# Patient Record
Sex: Male | Born: 1945
Health system: Southern US, Community
[De-identification: ages and names within clinical notes are randomized; demographics above are authoritative.]

## PROBLEM LIST (undated history)

## (undated) DIAGNOSIS — Z955 Presence of coronary angioplasty implant and graft: Secondary | ICD-10-CM

## (undated) DIAGNOSIS — Z8631 Personal history of diabetic foot ulcer: Secondary | ICD-10-CM

## (undated) DIAGNOSIS — G35 Multiple sclerosis: Secondary | ICD-10-CM

## (undated) DIAGNOSIS — I2510 Atherosclerotic heart disease of native coronary artery without angina pectoris: Secondary | ICD-10-CM

## (undated) DIAGNOSIS — R3911 Hesitancy of micturition: Secondary | ICD-10-CM

## (undated) DIAGNOSIS — R269 Unspecified abnormalities of gait and mobility: Secondary | ICD-10-CM

## (undated) DIAGNOSIS — E11610 Type 2 diabetes mellitus with diabetic neuropathic arthropathy: Secondary | ICD-10-CM

## (undated) DIAGNOSIS — G894 Chronic pain syndrome: Secondary | ICD-10-CM

## (undated) DIAGNOSIS — N401 Enlarged prostate with lower urinary tract symptoms: Secondary | ICD-10-CM

## (undated) DIAGNOSIS — E1140 Type 2 diabetes mellitus with diabetic neuropathy, unspecified: Secondary | ICD-10-CM

## (undated) DIAGNOSIS — E109 Type 1 diabetes mellitus without complications: Secondary | ICD-10-CM

## (undated) DIAGNOSIS — Z8673 Personal history of transient ischemic attack (TIA), and cerebral infarction without residual deficits: Secondary | ICD-10-CM

## (undated) DIAGNOSIS — F329 Major depressive disorder, single episode, unspecified: Secondary | ICD-10-CM

## (undated) DIAGNOSIS — I252 Old myocardial infarction: Secondary | ICD-10-CM

## (undated) DIAGNOSIS — F32A Depression, unspecified: Secondary | ICD-10-CM

## (undated) DIAGNOSIS — G250 Essential tremor: Secondary | ICD-10-CM

## (undated) DIAGNOSIS — N182 Chronic kidney disease, stage 2 (mild): Secondary | ICD-10-CM

## (undated) DIAGNOSIS — M1990 Unspecified osteoarthritis, unspecified site: Secondary | ICD-10-CM

## (undated) DIAGNOSIS — Z9889 Other specified postprocedural states: Secondary | ICD-10-CM

## (undated) DIAGNOSIS — I6930 Unspecified sequelae of cerebral infarction: Secondary | ICD-10-CM

## (undated) DIAGNOSIS — K056 Periodontal disease, unspecified: Secondary | ICD-10-CM

## (undated) DIAGNOSIS — I10 Essential (primary) hypertension: Secondary | ICD-10-CM

## (undated) DIAGNOSIS — R112 Nausea with vomiting, unspecified: Secondary | ICD-10-CM

## (undated) HISTORY — DX: Unspecified abnormalities of gait and mobility: R26.9

## (undated) HISTORY — PX: CORONARY ANGIOPLASTY WITH STENT PLACEMENT: SHX49

---

## 1968-10-08 HISTORY — PX: OTHER SURGICAL HISTORY: SHX169

## 2003-02-07 ENCOUNTER — Ambulatory Visit (HOSPITAL_COMMUNITY): Admission: RE | Admit: 2003-02-07 | Discharge: 2003-02-07 | Payer: Self-pay | Admitting: Family Medicine

## 2003-09-16 ENCOUNTER — Encounter: Admission: RE | Admit: 2003-09-16 | Discharge: 2003-09-16 | Payer: Self-pay | Admitting: Neurology

## 2003-10-18 ENCOUNTER — Inpatient Hospital Stay (HOSPITAL_COMMUNITY): Admission: EM | Admit: 2003-10-18 | Discharge: 2003-10-21 | Payer: Self-pay | Admitting: *Deleted

## 2003-12-16 ENCOUNTER — Ambulatory Visit: Payer: Self-pay | Admitting: Cardiology

## 2004-03-05 ENCOUNTER — Ambulatory Visit: Payer: Self-pay | Admitting: Cardiology

## 2004-03-16 ENCOUNTER — Ambulatory Visit: Payer: Self-pay | Admitting: Cardiology

## 2004-03-22 ENCOUNTER — Ambulatory Visit: Payer: Self-pay | Admitting: Cardiology

## 2004-05-07 ENCOUNTER — Ambulatory Visit: Payer: Self-pay | Admitting: Cardiology

## 2004-08-06 ENCOUNTER — Ambulatory Visit: Payer: Self-pay | Admitting: Cardiology

## 2004-08-16 ENCOUNTER — Ambulatory Visit: Payer: Self-pay

## 2005-03-30 ENCOUNTER — Ambulatory Visit: Payer: Self-pay | Admitting: Cardiology

## 2006-01-06 ENCOUNTER — Ambulatory Visit: Payer: Self-pay | Admitting: Cardiology

## 2007-01-08 ENCOUNTER — Ambulatory Visit: Payer: Self-pay | Admitting: Cardiology

## 2008-01-21 ENCOUNTER — Ambulatory Visit: Payer: Self-pay | Admitting: Cardiology

## 2008-09-06 ENCOUNTER — Emergency Department (HOSPITAL_BASED_OUTPATIENT_CLINIC_OR_DEPARTMENT_OTHER): Admission: EM | Admit: 2008-09-06 | Discharge: 2008-09-06 | Payer: Self-pay | Admitting: Emergency Medicine

## 2008-09-06 ENCOUNTER — Ambulatory Visit: Payer: Self-pay | Admitting: Diagnostic Radiology

## 2008-09-08 ENCOUNTER — Inpatient Hospital Stay (HOSPITAL_COMMUNITY): Admission: AD | Admit: 2008-09-08 | Discharge: 2008-09-15 | Payer: Self-pay | Admitting: Endocrinology

## 2008-09-09 ENCOUNTER — Ambulatory Visit: Payer: Self-pay | Admitting: Infectious Diseases

## 2008-09-10 ENCOUNTER — Encounter (INDEPENDENT_AMBULATORY_CARE_PROVIDER_SITE_OTHER): Payer: Self-pay | Admitting: Orthopedic Surgery

## 2008-09-10 ENCOUNTER — Ambulatory Visit: Payer: Self-pay | Admitting: Vascular Surgery

## 2008-09-12 ENCOUNTER — Ambulatory Visit: Payer: Self-pay | Admitting: Physical Medicine & Rehabilitation

## 2008-09-20 ENCOUNTER — Inpatient Hospital Stay (HOSPITAL_COMMUNITY): Admission: EM | Admit: 2008-09-20 | Discharge: 2008-09-25 | Payer: Self-pay | Admitting: Emergency Medicine

## 2008-10-02 ENCOUNTER — Encounter (HOSPITAL_BASED_OUTPATIENT_CLINIC_OR_DEPARTMENT_OTHER): Admission: RE | Admit: 2008-10-02 | Discharge: 2008-12-31 | Payer: Self-pay | Admitting: Internal Medicine

## 2008-10-14 ENCOUNTER — Encounter (INDEPENDENT_AMBULATORY_CARE_PROVIDER_SITE_OTHER): Payer: Self-pay | Admitting: *Deleted

## 2008-11-14 ENCOUNTER — Encounter (INDEPENDENT_AMBULATORY_CARE_PROVIDER_SITE_OTHER): Payer: Self-pay | Admitting: *Deleted

## 2009-01-06 DIAGNOSIS — I2510 Atherosclerotic heart disease of native coronary artery without angina pectoris: Secondary | ICD-10-CM | POA: Insufficient documentation

## 2009-01-06 DIAGNOSIS — E108 Type 1 diabetes mellitus with unspecified complications: Secondary | ICD-10-CM | POA: Insufficient documentation

## 2009-01-06 DIAGNOSIS — I10 Essential (primary) hypertension: Secondary | ICD-10-CM | POA: Insufficient documentation

## 2009-01-06 DIAGNOSIS — E538 Deficiency of other specified B group vitamins: Secondary | ICD-10-CM | POA: Insufficient documentation

## 2009-01-06 DIAGNOSIS — E119 Type 2 diabetes mellitus without complications: Secondary | ICD-10-CM | POA: Insufficient documentation

## 2009-01-07 ENCOUNTER — Ambulatory Visit: Payer: Self-pay | Admitting: Cardiology

## 2009-01-07 DIAGNOSIS — E7800 Pure hypercholesterolemia, unspecified: Secondary | ICD-10-CM | POA: Insufficient documentation

## 2009-06-11 ENCOUNTER — Encounter: Payer: Self-pay | Admitting: Cardiology

## 2010-01-22 ENCOUNTER — Ambulatory Visit: Payer: Self-pay | Admitting: Cardiology

## 2010-02-27 ENCOUNTER — Encounter: Payer: Self-pay | Admitting: Family Medicine

## 2010-03-09 NOTE — Letter (Signed)
Summary: [facility] Office Note  [facility] Office Note   Imported By: Roderic Ovens 07/29/2009 15:55:28  _____________________________________________________________________  External Attachment:    Type:   Image     Comment:   External Document

## 2010-05-15 LAB — PROTIME-INR: Prothrombin Time: 13.2 seconds (ref 11.6–15.2)

## 2010-05-15 LAB — TYPE AND SCREEN: Antibody Screen: NEGATIVE

## 2010-05-15 LAB — ABO/RH: ABO/RH(D): O POS

## 2010-05-15 LAB — ANAEROBIC CULTURE

## 2010-05-15 LAB — DIFFERENTIAL
Eosinophils Absolute: 0.1 10*3/uL (ref 0.0–0.7)
Eosinophils Relative: 2 % (ref 0–5)
Lymphs Abs: 1.6 10*3/uL (ref 0.7–4.0)
Monocytes Absolute: 0.5 10*3/uL (ref 0.1–1.0)

## 2010-05-15 LAB — BASIC METABOLIC PANEL
GFR calc Af Amer: >60 mL/min (ref >60)
GFR calc non Af Amer: 60 mL/min — ABNORMAL LOW (ref >60)
Potassium: 4 mEq/L (ref 3.5–5.1)
Sodium: 137 mEq/L (ref 135–145)

## 2010-05-15 LAB — GLUCOSE, CAPILLARY
Glucose-Capillary: 114 mg/dL — ABNORMAL HIGH (ref 70–99)
Glucose-Capillary: 130 mg/dL — ABNORMAL HIGH (ref 70–99)
Glucose-Capillary: 133 mg/dL — ABNORMAL HIGH (ref 70–99)
Glucose-Capillary: 155 mg/dL — ABNORMAL HIGH (ref 70–99)
Glucose-Capillary: 216 mg/dL — ABNORMAL HIGH (ref 70–99)
Glucose-Capillary: 229 mg/dL — ABNORMAL HIGH (ref 70–99)
Glucose-Capillary: 242 mg/dL — ABNORMAL HIGH (ref 70–99)
Glucose-Capillary: 270 mg/dL — ABNORMAL HIGH (ref 70–99)
Glucose-Capillary: 89 mg/dL (ref 70–99)
Glucose-Capillary: 119 mg/dL — ABNORMAL HIGH (ref 70–99)
Glucose-Capillary: 163 mg/dL — ABNORMAL HIGH (ref 70–99)
Glucose-Capillary: 213 mg/dL — ABNORMAL HIGH (ref 70–99)
Glucose-Capillary: 91 mg/dL (ref 70–99)

## 2010-05-15 LAB — POCT I-STAT GLUCOSE: Operator id: 117072

## 2010-05-15 LAB — VANCOMYCIN, TROUGH: Vancomycin Tr: 17 ug/mL (ref 10.0–20.0)

## 2010-05-15 LAB — CBC
HCT: 37.2 % — ABNORMAL LOW (ref 39.0–52.0)
Hemoglobin: 12.8 g/dL — ABNORMAL LOW (ref 13.0–17.0)
Platelets: 362 10*3/uL (ref 150–400)
WBC: 7 10*3/uL (ref 4.0–10.5)

## 2010-05-15 LAB — CULTURE, ROUTINE-ABSCESS

## 2010-05-16 LAB — BASIC METABOLIC PANEL
BUN: 7 mg/dL (ref 6–23)
BUN: 9 mg/dL (ref 6–23)
CO2: 29 mEq/L (ref 19–32)
CO2: 31 mEq/L (ref 19–32)
Chloride: 103 mEq/L (ref 96–112)
Chloride: 99 mEq/L (ref 96–112)
Chloride: 99 mEq/L (ref 96–112)
Creatinine, Ser: 0.9 mg/dL (ref 0.4–1.5)
Creatinine, Ser: 1.1 mg/dL (ref 0.4–1.5)
GFR calc Af Amer: >60 mL/min (ref >60)
GFR calc non Af Amer: >60 mL/min (ref >60)
Glucose, Bld: 199 mg/dL — ABNORMAL HIGH (ref 70–99)
Glucose, Bld: 249 mg/dL — ABNORMAL HIGH (ref 70–99)
Glucose, Bld: 75 mg/dL (ref 70–99)
Potassium: 3.7 mEq/L (ref 3.5–5.1)
Sodium: 136 mEq/L (ref 135–145)

## 2010-05-16 LAB — GLUCOSE, CAPILLARY
Glucose-Capillary: 104 mg/dL — ABNORMAL HIGH (ref 70–99)
Glucose-Capillary: 146 mg/dL — ABNORMAL HIGH (ref 70–99)
Glucose-Capillary: 149 mg/dL — ABNORMAL HIGH (ref 70–99)
Glucose-Capillary: 154 mg/dL — ABNORMAL HIGH (ref 70–99)
Glucose-Capillary: 165 mg/dL — ABNORMAL HIGH (ref 70–99)
Glucose-Capillary: 167 mg/dL — ABNORMAL HIGH (ref 70–99)
Glucose-Capillary: 169 mg/dL — ABNORMAL HIGH (ref 70–99)
Glucose-Capillary: 181 mg/dL — ABNORMAL HIGH (ref 70–99)
Glucose-Capillary: 182 mg/dL — ABNORMAL HIGH (ref 70–99)
Glucose-Capillary: 185 mg/dL — ABNORMAL HIGH (ref 70–99)
Glucose-Capillary: 190 mg/dL — ABNORMAL HIGH (ref 70–99)
Glucose-Capillary: 213 mg/dL — ABNORMAL HIGH (ref 70–99)
Glucose-Capillary: 220 mg/dL — ABNORMAL HIGH (ref 70–99)
Glucose-Capillary: 223 mg/dL — ABNORMAL HIGH (ref 70–99)
Glucose-Capillary: 244 mg/dL — ABNORMAL HIGH (ref 70–99)
Glucose-Capillary: 254 mg/dL — ABNORMAL HIGH (ref 70–99)
Glucose-Capillary: 270 mg/dL — ABNORMAL HIGH (ref 70–99)
Glucose-Capillary: 272 mg/dL — ABNORMAL HIGH (ref 70–99)
Glucose-Capillary: 39 mg/dL — CL (ref 70–99)
Glucose-Capillary: 71 mg/dL (ref 70–99)
Glucose-Capillary: 76 mg/dL (ref 70–99)
Glucose-Capillary: 82 mg/dL (ref 70–99)
Glucose-Capillary: 91 mg/dL (ref 70–99)

## 2010-05-16 LAB — CBC
Hemoglobin: 14.8 g/dL (ref 13.0–17.0)
MCHC: 34.4 g/dL (ref 30.0–36.0)
MCHC: 34.6 g/dL (ref 30.0–36.0)
MCHC: 35.3 g/dL (ref 30.0–36.0)
MCV: 97.8 fL (ref 78.0–100.0)
MCV: 97.9 fL (ref 78.0–100.0)
Platelets: 179 10*3/uL (ref 150–400)
Platelets: 186 10*3/uL (ref 150–400)
RBC: 4.3 MIL/uL (ref 4.22–5.81)
RDW: 12.1 % (ref 11.5–15.5)
RDW: 12.6 % (ref 11.5–15.5)
RDW: 12.7 % (ref 11.5–15.5)
WBC: 10.3 10*3/uL (ref 4.0–10.5)

## 2010-05-16 LAB — DIFFERENTIAL
Basophils Absolute: 0.1 10*3/uL (ref 0.0–0.1)
Basophils Relative: 1 % (ref 0–1)
Eosinophils Absolute: 0 10*3/uL (ref 0.0–0.7)
Monocytes Absolute: 1 10*3/uL (ref 0.1–1.0)
Monocytes Relative: 12 % (ref 3–12)
Neutrophils Relative %: 74 % (ref 43–77)

## 2010-05-16 LAB — CULTURE, BLOOD (ROUTINE X 2)
Culture: NO GROWTH
Culture: NO GROWTH
Culture: NO GROWTH

## 2010-05-16 LAB — C-REACTIVE PROTEIN: CRP: 12.7 mg/dL — ABNORMAL HIGH (ref <0.6)

## 2010-05-16 LAB — COMPREHENSIVE METABOLIC PANEL
ALT: 42 U/L (ref 0–53)
Albumin: 2.8 g/dL — ABNORMAL LOW (ref 3.5–5.2)
Alkaline Phosphatase: 231 U/L — ABNORMAL HIGH (ref 39–117)
Calcium: 8.7 mg/dL (ref 8.4–10.5)
Potassium: 3.5 mEq/L (ref 3.5–5.1)
Sodium: 138 mEq/L (ref 135–145)
Total Protein: 6.6 g/dL (ref 6.0–8.3)

## 2010-05-16 LAB — ANAEROBIC CULTURE: Gram Stain: NONE SEEN

## 2010-05-16 LAB — TISSUE CULTURE

## 2010-05-16 LAB — SEDIMENTATION RATE: Sed Rate: 50 mm/hr — ABNORMAL HIGH (ref 0–16)

## 2010-05-16 LAB — CREATININE, SERUM: GFR calc non Af Amer: >60 mL/min (ref >60)

## 2010-06-22 NOTE — Consult Note (Signed)
[name], [name]                 ACCOUNT NO.:  [id number]   MEDICAL RECORD NO.:  [id number]          PATIENT TYPE:  INP   LOCATION:  1430                         FACILITY:  [facility]   PHYSICIAN:  Alvy Beal, MD    DATE OF BIRTH:  22-Sep-1945   DATE OF CONSULTATION:  09/08/2008  DATE OF DISCHARGE:                                 CONSULTATION   CONSULTATION REQUESTED BY:  Tera Mater. Evlyn Kanner, M.D.   REASON FOR CONSULTATION:  Left medial aspect of the foot.   The patient had suffered a foot fracture in the past.  He has a known  history of peripheral neuropathy which delayed treatment.  He ultimately  had it treated but he said he has always had a significant callus on the  medial side of that left foot.  Over the last week or so he was having  fevers and intermittent chills and ultimately went to the outpatient  emergency room in [facility].  There he had it debrided.  This occurred  on Saturday and approximately 2 days later he was having increasing  fevers, chills and so he presented to his primary care physician, Dr.  Evlyn Kanner.  Dr. Evlyn Kanner was worried about the patient becoming septic and  having worsening infection.  The patient also indicated that the overall  appearance was worse since the debridement 2 days earlier.  As a result  he was admitted to the hospital and I was consulted for definitive  management for evaluation and further possible surgical intervention.   Please refer to Dr. Rosanna Randy and P for specifics on the past medical,  surgical, family history, social history, medications and allergies.  It  should be noted the patient does have a history of diabetes and  peripheral neuropathy.   CLINICAL EXAM:  The patient was evaluated with my partner Dr. Lestine Box.  There is a significant erythema on the medial aspect of the foot with an  obvious bony prominence and a significant ulceration.  The patient has  pitting edema up to about the level of the ankle.  There is also  drainage expressible with deep palpation.  The patient has palpable but  very diminished pulses in the dorsalis pedis and posterior tibialis  regions.  Capillary refill cannot be determined because of nail bed  changes.   The patient has no knee pain.  No midcalf or proximal calf tenderness.  His compartments are soft and nontender.  There are no other obvious  skin abrasions, lacerations or ulcerations noted.  The patient is alert  and oriented x3.  He has no shortness of breath or chest pain.  His  abdomen is soft and nontender.   X-rays are now in the process of being done.  At this point in time I am  concerned about the patient's fevers and chills and progressive  deterioration of the ulceration since Saturday.  Both Dr. Lestine Box and I  feel as though it is very reasonable to take him to the operating room  for an incision and drainage.   Postoperatively I would put him into  a bulky dry dressing and splint.  On postoperative day #2 I would recommend having the splint removed for  wound care evaluation.   I think he would also benefit from a PICC line for prolonged IV  antibiotic treatment.  Given his questionable vascular status, I also  think it is reasonable to obtain an ABI after the splint is removed.  This was discussed with the patient and his wife.  I did discuss  surgery.  All appropriate risks, benefits and alternatives were  discussed and consent was obtained.      Alvy Beal, MD  Electronically Signed     DDB/MEDQ  D:  09/08/2008  T:  09/08/2008  Job:  [id number]   cc:   Tera Mater. Evlyn Kanner, M.D.  Fax: [phone]

## 2010-06-22 NOTE — Assessment & Plan Note (Signed)
[facility]                            CARDIOLOGY OFFICE NOTE   NAME:[name], [name] VALLADOLID                        MRN:          [id number]  DATE:01/21/2008                            DOB:          20-Nov-1945    [name] is in for a followup visit.  In general, he has been relatively  stable.  He has not been having any ongoing chest pain.  He has however  been under a fair amount of stress.  His mother has progressive dementia  and his elderly father is providing her care.  He says he has not been  smoking at all.  He sees himself as being somewhat emotionally unstable,  but he said he has been working with Dr. Jacky Kindle on this.  His blood  pressures at home have been relatively high, and not particularly under  great control.   Medications include amlodipine 5 mg daily, oxycodone 4 times daily,  Plavix 75 mg daily, Avodart 0.5 mg at bedtime, trazodone 150 mg at  bedtime, aspirin 81 mg daily, insulin, vitamins, Lipitor 10 mg at  bedtime, and metoprolol 25 mg p.o. t.i.d.   On physical exam, blood pressure is 162/80, pulse is 63.  The lung  fields are clear and the cardiac rhythm is regular.  There is not a  definite murmur noted.   Electrocardiogram demonstrates normal sinus rhythm within normal limits.   IMPRESSION:  1. Coronary artery disease with prior percutaneous stenting of the      right coronary artery and diabetic blood changes with previously a      drug-eluting stent.  2. Hypercholesterolemia on lipid-lowering therapy.  3. Hypertension.   RECOMMENDATIONS:  1. Continue follow up with Dr. Jacky Kindle.  2. We will increase his amlodipine to 10 mg daily and I have warned      him that he may experience some lower extremity swelling.  3. He is to call in with his blood pressures to let us know where he      stand within a few weeks.  He can check these at home this time.     Arturo Morton. Riley Kill, MD, [facility]  Electronically Signed    TDS/MedQ  DD:  01/21/2008  DT: 01/22/2008  Job #: [id number]

## 2010-06-22 NOTE — Op Note (Signed)
[name], [name]                 ACCOUNT NO.:  [id number]   MEDICAL RECORD NO.:  [id number]          PATIENT TYPE:  INP   LOCATION:  2550                         FACILITY:  [facility]   PHYSICIAN:  Almedia Balls. Ranell Patrick, M.D. DATE OF BIRTH:  06/11/1945   DATE OF PROCEDURE:  09/21/2008  DATE OF DISCHARGE:                               OPERATIVE REPORT   PREOPERATIVE DIAGNOSIS:  Left foot infection.   POSTOPERATIVE DIAGNOSIS:  Left foot infection.   PROCEDURE PERFORMED:  Left foot incision and drainage.   ATTENDING SURGEON:  Almedia Balls. Ranell Patrick, MD   ASSISTANTS:  None.   ANESTHESIA:  General anesthesia was used.   ESTIMATED BLOOD LOSS:  Minimal.   FLUID REPLACEMENT:  500 mL crystalloid.   Aerobic and anaerobic cultures were sent.   TOURNIQUET TIME:  17 minutes at 275 mmHg.   INSTRUMENT COUNTS:  Correct.   COMPLICATIONS:  None.   PERIOPERATIVE ANTIBIOTICS:  Vancomycin was given.   INDICATIONS:  The patient is a 65 year old male with worsening left foot  redness and drainage from a medial wound near his midfoot.  The patient  has had prior surgery dealing with a ulcer to the medial side of his  foot.  The patient is a diabetic.  The patient was seen by home health  today and the nurse called Orthopedics concerned about increasing  drainage and erythema around the medial incision and wound.  The patient  presents now with refractory foot swelling and pain, concern over deep  abscess on the plantar aspect of the foot with desquamation around the  plantar aspect of the foot.  Discussed with the patient and his wife  treatment options.  It is my concern that the patient had a very small  opening and that there might be a much larger abscess present as  purulence could be expressed from the plantar aspect of the foot.  Given  risk for increasing infection, family and the patient elected to proceed  with surgical management with I and D.  Informed consent obtained.   DESCRIPTION OF  PROCEDURE:  After an adequate level of anesthesia  achieved, the patient positioned supine on the operating table.  A calf  tourniquet was placed.  The left foot and leg were prepped and draped in  usual manner.  After elevation of limb for 3 minutes, we elevated the  tourniquet to 275 mmHg.  A longitudinal medial incision was created  lifting out the prior ulcer and opening at the junction between the  midfoot and forefoot.  The superficial layers of skin that was peeling  off was removed in the surrounding area.  All nonviable tissue was  removed from the medial aspect of the base of the great toe.  The wound  seemed to track plantar.  Blunt dissection was done under the metatarsal  into the deep plantar space.  The purulence that was encountered there  was basically cloudy-bloody fluid.  We did go ahead and cultured that  with aerobic, anaerobic cultures and Gram stain.  We then pulse  irrigated with 3 liters of irrigation the  joint capsule, the MTP joint  was intact as were the plantar tendons.  We thoroughly pulse irrigated,  everything looked to be very viable and vascular at that point.  There  was no more remaining infection.  We went ahead and placed a moistened  normal saline gauze into that area to control dead space and then dry  dressing over that.  The patient tolerated surgery well.      Almedia Balls. Ranell Patrick, M.D.  Electronically Signed     SRN/MEDQ  D:  09/21/2008  T:  09/21/2008  Job:  [id number]

## 2010-06-22 NOTE — Assessment & Plan Note (Signed)
[name]                            CARDIOLOGY OFFICE NOTE   NAME:[name], [name] REICHARDT                        MRN:          [id number]  DATE:01/08/2007                            DOB:          [birth year]    The patient is in for a follow-up visit.  He really is doing quite well.  He says he is not smoking.  He smokes an occasional cigar, but otherwise  is without.  He has a fair amount of pain related to his neuropathy, but  otherwise has done reasonably well.  He denies any cardiac pain at the  present time.  The patient underwent stenting of the distal right  coronary artery, and has longstanding diabetes.   MEDICATIONS:  1. Amlodipine 5 mg daily.  2. Oxycodone q.i.d.  3. Plavix 75 mg daily.  4. Avodart 0.5 mg q.h.s.  5. Trazodone 150 mg q.h.s.  6. Aspirin 81 mg daily.  7. Insulin daily.  8. Lipitor 10 mg q.h.s.  9. Metoprolol 25 mg p.o. b.i.d.   PHYSICAL EXAMINATION:  VITAL SIGNS:  Initially the blood pressure is  184/70 and when rechecked by me it was 145/70, pulse 72.  LUNGS:  Fields are clear.  HEART:  Rhythm is regular.  No murmur is appreciated.  There is no  obvious carotid bruit.   The electrocardiogram demonstrates normal sinus rhythm within normal  limits.   IMPRESSION:  1. Insulin-dependent diabetes mellitus.  2. Coronary artery disease, status post percutaneous stenting of the      distal right coronary artery using a drug eluting stent.  3. Multiple diffuse luminal irregularities.  4. Prior history of smoking.   PLAN:  Continue current medial regimen.  Continue follow-up with [name] [name], M.D.  Focus on diabetic and lipid control.  Return to clinic in  one year.     [name] [name]. [name] Kill, MD, [facility]  Electronically Signed    TDS/MedQ  DD: 01/08/2007  DT: 01/09/2007  Job #: [id number]

## 2010-06-22 NOTE — Op Note (Signed)
NAMEKYDAN, SHANHOLTZER                 ACCOUNT NO.:  [id number]   MEDICAL RECORD NO.:  [id number]          PATIENT TYPE:  INP   LOCATION:  1430                         FACILITY:  [facility]   PHYSICIAN:  Alvy Beal, MD    DATE OF BIRTH:  Jul 27, 1945   DATE OF PROCEDURE:  09/08/2008  DATE OF DISCHARGE:                               OPERATIVE REPORT   PREOPERATIVE DIAGNOSIS:  Left diabetic foot ulcer with sepsis.   POSTOPERATIVE DIAGNOSIS:  Left diabetic foot ulcer with sepsis.   OPERATIVE PROCEDURE:  I and D.   INTRAOPERATIVE FINDINGS:  Significant purulent material with wide  excision of infected material and application of splint.   HISTORY:  This is a very pleasant, 65 year old gentleman, who has had a  1-week history of fevers and increasing left foot swelling, erythema,  and drainage, seen in the ER at an outside institution Saturday, had a  local wound debridement and got significantly worse, presented to his  primary care physician, and directly admitted for further care.   After discussing treatment options with the patient, we elected to take  him to the operating room for a formal I and D of the wound.   All appropriate risks, benefits, and alternatives were discussed,  consent was obtained.   OPERATIVE NOTE:  The patient was brought to the operating room, placed  supine on the operating table.  After successful induction of general  anesthesia and endotracheal intubation, the left lower extremity was  prepped and draped in a standard fashion.  We did perform an appropriate  timeout, confirmed procedure, patient, and extremity.  At this point in  time, I made a central incision through the necrotic infected area and  noted there to be a significant pus abscess pocket.  Because of this and  the necrotic-looking nature of the tissue, I elected to do a wide  excision.  I excised all the necrotic tissue until I had about a 4 x 4.5-  cm size debrided area (about the same size  as a half dollar).  At this  point, I could not express any more purulent material and I had healthy  bleeding tissue.  I then irrigated the wound copiously with 9 L of  pulsatile irrigation.  I then put 3 stitches to help to gently bring the  skin edges closer to one another to facilitate later wound closure.  I  then placed a bulky dry dressing and a splint.  The patient was  extubated and transferred to the PACU without incident.  At the end of  case, all needle and sponge counts were correct.  The patient tolerated  the procedure well.      Alvy Beal, MD  Electronically Signed     DDB/MEDQ  D:  09/08/2008  T:  09/09/2008  Job:  [id number]

## 2010-06-22 NOTE — Discharge Summary (Signed)
[name], [name] NO.:  [id number]   MEDICAL RECORD NO.:  [id number]          PATIENT TYPE:  INP   LOCATION:  1430                         [facility]ACILITY:  [facility]   PHYSICIAN:  Geoffry Paradise, M.D.  DATE OF BIRTH:  [birth year]   DATE OF ADMISSION:  09/08/2008  DATE OF DISCHARGE:  09/15/2008                               DISCHARGE SUMMARY   DISCHARGE DIAGNOSES:  1. Left diabetic foot ulcer with soft tissue infection, status post      incision and drainage by Dr. Shon Baton of orthopedic surgery.  2. Diabetes mellitus type 1.  3. Diabetic neuropathy.  4. Diabetic nephropathy.  5. Hyperlipidemia.  6. Essential hypertension.  7. Atherosclerotic coronary artery disease, status post myocardial      infarction and stent.  8. Multiple sclerosis.  9. 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   HISTORY OF PRESENT ILLNESS:  [name] is a very pleasant 65 year old  gentleman who is currently disabled with Charcot feet and diabetic  neuropathy, multiple sclerosis, diabetes mellitus type 1, who presents  at this time with a persistent and progressive left foot infection.  He  has already been seen in the [facility] emergency room evidently several  days prior and underwent a minor I and D to this left foot.  Subsequent  to this, he has had progressive fever, chills pain and drainage  involving the left foot, resulting in re-presentation with obvious  diabetic foot infection worrisome for osteomyelitis and needing I and D.  He was admitted for IV antibiotics and surgical consultation.  For  details, see the dictated summary by my partner, Dr. Evlyn Kanner.   DATA:  Blood culture x2 negative.  Wound culture intraoperative  negative.  Chemistry; sodium 136, potassium 3.7, chloride 99, pO2 of 29,  glucose 199, BUN 0, creatinine 1.12, calcium 7.8.   HOSPITAL COURSE:  The patient was admitted and treated with his usual  home medications and  sliding scale insulin.  He was placed on a  combination of IV Zosyn and IV vancomycin.  Infectious disease and Dr.  Shon Baton of [facility] orth[facility]ics were consulted as well.  He underwent  incision and drainage of this left foot infection by Dr. Shon Baton on  September 08, 2008, with good success.  Subsequent cultures were negative  and ultimately wound continued to heal.  It was clean and dry with no  further purulence.  He did continue on a combination of vancomycin and  Zosyn with the recommendation by infectious disease to convert the Zosyn  to Avelox and continue the vancomycin at the time being.  The actual  wound per recommendations of wound care and orthopedic surgery, included  Aquagel into the left foot wound using this daily, cover with Kerlix and  Ace wrap, moisten with normal saline to remove previous day dressing.  Dr. Shon Baton did see him on the day of discharge and feels as though the  wound is nice and clean and dry with no concerns.  He is currently in a  Cam walker, nonweightbearing.  He  has been offered both skilled nursing  and inpatient rehab, but has declined both, insisting he will manage at  home.  I have cautioned him as to the risks and concerns with regards to  the continued need for IV antibiotics, timeliness and accuracy of his  dressing changes and the need for nonweightbearing, and he is fully  competent and understanding of all of the above.  He claims that he, his  wife and home health will be able to manage this.  From a vascular  standpoint, he did have arterial Dopplers done.  ABIs greater than 1  bilaterally with no concern for arterial insufficiency.  As such, he is  discharged in much improved and stable condition for further as an  outpatient.  He is resuming all of his home medications, including all  the insulin dosing.  The only addition is Avelox 400 mg daily  for two additional weeks and continuing vancomycin 1 gram daily could be  further dosed and  addressed by home health/advanced home care via his  right upper extremity PICC line.  He will be following up with Dr.  Shon Baton on September 22, 2008, and Dr. Jacky Kindle in approximately 2 weeks as  well.           ______________________________  Geoffry Paradise, M.D.     RA/MEDQ  D:  09/15/2008  T:  09/15/2008  Job:  [id number]   cc:   Alvy Beal, MD  Fax: [phone]

## 2010-06-22 NOTE — Assessment & Plan Note (Signed)
[facility]   NAME:  [name], [name] NO.:  [id number]   MEDICAL RECORD NO.:  [id number]      DATE OF BIRTH:  [birth year]   PHYSICIAN:  Jonelle Sports. Sevier, M.D.  VISIT DATE:  10/08/2008                                   OFFICE VISIT   HISTORY:  This 65 year old white male with longstanding type 1 diabetes  was seen here a week ago following extremely extended course of in-  hospital treatment for deeply infected diabetic left foot.  This  occurred in association with Charcot deformity in association with which  he had developed calluses, which became secondarily infected, and this  rapidly became deeply infected.  His only culture showed some  microaerophilic strep, but during the hospital after he had deep  surgical drainage, the Infectious Disease people recommended the use of  Avelox and vancomycin, which he has been receiving intravenously.   When he was seen a week ago by Dr. Leanord Hawking for his initial appointment  here, he was concerned that this might not prove adequate in that  something with some better potential anaerobic coverage might need to be  added.   Fortunately, the patient's wife who is his caregiver at home reports  that his feet have continued to improve with less swelling, less  erythema, and improvement wounds themselves since that time.   The wounds themselves incidentally were treated with silver alginate,  Kerlix, and Ace wrap.   On examination today, his blood pressure is 181/73, pulse 62,  respirations 18, temperature 98.5.  His more distal wound on the plantar  medial aspect of that foot measures 1.5 x 1.0 x 0.1 cm and have some  undermining at one of its margins.   The more proximal wound measures 2.0 x 0.8 x 0.2 cm and has a reasonably  granular base.  There is some erythema surrounding these wounds, but  there is no warmth and I suspect this is really the redness of the  chronic hyperemia associated with the  healing process.  There is no  drainage from either wound.  No significant swelling of the foot or  distal lower extremity.   IMPRESSION:  Satisfactory progress, healing deep foot infection, left.   DISPOSITION:  The wounds are sharply debrided selectively so with  removal of some skin from the wound margins and also slough from the  wound bases.  The more distal wound bleeds vigorously, but this is  quickly controlled with simple pressure.   The patient will be treated with reapplication of silver alginate, the  Kerlix, and Ace wrap; and his wife will continue to change the dressing  replacing it with the same after cleansing the wound on a daily basis.  The Vaseline is to be used on the periwound area for its protection.  He  is to continue his IV vancomycin and Avelox at present.   Followup visit here will be in 2 weeks.           ______________________________  Jonelle Sports Cheryll Cockayne, M.D.     RES/MEDQ  D:  10/08/2008  T:  10/09/2008  Job:  [id number]

## 2010-06-22 NOTE — Discharge Summary (Signed)
NAMESAMARION, EHLE NO.:  [id number]   MEDICAL RECORD NO.:  [id number]          PATIENT TYPE:  INP   LOCATION:  1430                         [facility]   PHYSICIAN:  Geoffry Paradise, M.D.  DATE OF BIRTH:  23-Aug-1945   DATE OF ADMISSION:  09/08/2008  DATE OF DISCHARGE:  09/15/2008                               DISCHARGE SUMMARY   DIAGNOSES AT THE TIME OF DISCHARGE:  1. Diabetic foot infection left foot with soft tissue infection and      abscess status post incision and drainage by orthopedic surgery.  2. Diabetes mellitus.   Dictation ended at this point.           ______________________________  Geoffry Paradise, M.D.     RA/MEDQ  D:  09/15/2008  T:  09/15/2008  Job:  [id number]

## 2010-06-22 NOTE — H&P (Signed)
[name], INFINGER NO.:  [id number]   MEDICAL RECORD NO.:  [id number]          PATIENT TYPE:  EMS   LOCATION:  ED                            FACILITY:  [facility]   PHYSICIAN:  Tera Mater. [place], M.D. 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  He  had a painful red foot for about a day, and had an I and D of this done.  He had IV antibiotics and Augmentin prescribed.  Since being home, he  has had more redness, pain, chills, and sweats with clinical toxicity,  little p.o. intake, and blood sugar as high as 245.  Today, he has felt  even weaker and was seen in the office this afternoon, and is now being  directly admitted.  He has basically had nothing to eat today.  He notes  some mild headaches.  He has had no cardiac complaints.  He has had no  breathing trouble.  His foot does hurt and it is usually  nephropathically numb.   PAST MEDICAL HISTORY:  Includes type 1 diabetes dating back to age 8.  He has always been on insulin.  He has had nephropathy with proteinuria.  He has neuropathy and he has had the Charcot change in 2006.  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  He has had B12 deficiency in  the past, hypertension, coronary artery disease with an acute inferior  MI and a stent in September 2005, ejection fraction 60%.  He has had BPH  and abnormal PSA in 2006.  He has had multiple sclerosis diagnosed at  clinic around 1985 and an MRI in the early 1990s was normal.  He has had  submandibular gland removed and bilateral carpal tunnel repair.   SOCIAL HISTORY:  He is married with a son.  His son is going to Social worker  school.  He is a nonsmoker.   CURRENT MEDICATIONS:  1. NPH 43 in the morning 17 in the evening.  2. Trazodone 150 daily.  3. Reglan.  4. Metoprolol.  5. Plavix.  6. Aspirin.  7. Lipitor.  8. Oxycodone ER 80 scheduled 4 times a day.  9. Oxycodone 10/325 p.r.n.  10.Avodart 0.5.  11.Lipitor 10.  12.Norvasc 10.   ALLERGIES:  He is allergic to nothing.   EXAM:  VITAL SIGNS:  Here in the office, blood pressure is 168/76, pulse  92, respirations 15, temperature 98.7.  CONSTITUTIONAL:  He is a mildly toxic-appearing white male sitting up in  no distress.  HEENT:  Sclerae anicteric.  Face is symmetrical.  Mucous membranes are  moist.  NECK:  Supple.  Well-healed scar.  No bruits are heard.  LUNGS:  Clear without wheeze, rales, or rhonchi.  HEART:  Regular and slightly tachycardic with a systolic murmur 1/6.  ABDOMEN:  Soft, nontender.  No hepatosplenomegaly.  EXTREMITIES:  Reveal pulses to be relatively good.  The left mid foot is  quite red with a large draining ulcer in the mid foot with granulation  tissue, ascending cellulitis, and some edema.  NEUROLOGIC:  The patient is awake and alert, speech is clear.  He is in  a wheelchair.  He is not walking at present.  Speech is intact.  No  resting tremors present.   SUMMARY:  We have a 65 year old white male with 40 years of type 1  diabetes and Charcot foot presenting with a diabetic foot infection.  His scenario is worrisome for ascending infection with clinical toxicity  and perhaps early sepsis.  He needs I and D, debridement, IV  antibiotics, and fluids, and he is going to be seeing ortho this  evening.  He is going to be getting IV Zosyn with a stat dose.  He is  going to have labs checked with blood cultures.  His chronic pain  medication will be continued.  His coronary artery disease is stable on  Plavix and aspirin and will be continued.  His hypertension will be  followed.  It should come down a bit.  His hyperlipidemia medication  will be continued.   He is not treated specifically for his multiple  sclerosis.  All of this was discussed with the patient and his son, and  his wife, and I talked with Dr. Venita Lick at [facility],  who will be seeing him.           ______________________________  Tera Mater. Evlyn Kanner, M.D.     SAS/MEDQ  D:  09/08/2008  T:  09/08/2008  Job:  [id number]

## 2010-06-22 NOTE — Discharge Summary (Signed)
[name], [name] NO.:  [id number]   MEDICAL RECORD NO.:  [id number]          PATIENT TYPE:  INP   LOCATION:                               FACILITY:  [facility]   PHYSICIAN:  [name], M.D.     DATE OF BIRTH:  1945-03-31   DATE OF ADMISSION:  09/20/2008  DATE OF DISCHARGE:                               DISCHARGE SUMMARY   ADMITTING DIAGNOSES:  1. Left foot ulcer x1 month.  2. Diabetes mellitus, type 1.  3. Coronary artery disease.  4. Multiple sclerosis.  5. Chronic pain.  6. Diabetic neuropathy.  7. Microcytic anemia.  8. History of B12 deficiency.   DISCHARGE DIAGNOSES:  1. Status post incision and drainage, left foot ulcer.  2. Microcytic anemia.  3. History of B12 deficiency.  4. Charcot arthropathy secondary to diabetic foot neuropathy.  5. Left foot infection, polymicrobic.  6. Type 1 diabetes mellitus.  7. Coronary artery disease.  8. Hypertension.  9. Dyslipidemia.  10.Multiple sclerosis.   HISTORY:  [name]  is a 65 year old diabetic with Charcot arthropathy,  chronic left medial foot callus.  He notes that this callus broke up  approximately 1 month ago.  Towards the end of July, his foot became  progressively swollen and painful.  He was seen in [facility] ED on September 06, 2008, started on Augmentin.  It became worse as of September 08, 2008,  and was admitted for I and D of soft tissue abscess.  Assessed Gram-  stain result.  Group positive cocci in pairs and gram-positive culture  grew microaerobic streptococci.  The patient was initially treated with  vancomycin and Zosyn.  At his discharge, he was discharged on Avelox and  vancomycin.  His condition worsened, he was readmitted on September 20, 2008, for second I and D of the foot.  Pus was found in the deep plantar  portion of his foot.  The September 21, 2008, operative specimen did not  show any organisms on the standard culture most likely due to recent  antibiotics.  Since then  polymicrobic, mixed anaerobic infection most  likely grew out.  The patient was placed on IV vancomycin and changed to  p.o. Avelox and p.o. Avelox was changed to Invanz and it was recommended  by ID that he be on vancomycin and Invanz for additional 4-6 weeks.   SURGICAL PROCEDURES:  The patient was taken to the operating room on  September 21, 2008, by Dr. Malon [name].  The patient was placed under  general anesthesia, and then a left foot incision and drainage was  performed.  Blood loss minimal.  Complications none.  The patient  returned to recovery in good stable condition.   HOSPITAL COURSE:  Postop day #1, the patient resting comfortable,  afebrile.  Vital signs stable.  Cultures negative.   Postop day #2, the patient without complaints, doing well, tolerating  diet well.  Afebrile, vital signs stable, wounds benign.  No drainage,  no erythema, no color.  Wound care consult was made.   Postop day #3, the  patient awake, alert.  Wound care seeing the patient  daily and doing whirlpool.  Tissue cultures showed microaerophilic  streptococci, otherwise, cultures negative to date.  ID consult was made  for antibiotic selection.  The patient was placed in Bledsoe full  contact boot, nonweightbearing on the left leg.   ID saw the patient and changed antibiotics to Invanz and vancomycin,  both IV for 4-6 weeks.   Postop day #4, the patient is doing well.  No complaints.  Pain under  control.  Some confusion about his boot.  He was told again he needed to  be on the full contact Bledsoe boot and to remain nonweightbearing.  The  patient was still in the contact boot, he was nonweightbearing.  The  patient was afebrile, vital signs stable.  His left foot wounds all with  granular tissue, some minimal serosanguineous drainage.  No purulent  drainage, no erythema, no color.   EKG on September 20, 2008, sinus rhythm with a rate of 83 beats per minute,  PR interval 140 milliseconds, PRT axis  was 72, 62, 101.   CONSULTS:  Following consults were obtained while the patient was  hospitalized:  Case Management, Wound Care, Infectious Disease,  Pharmacy.   DISCHARGE MEDICATIONS:  1. Aspirin 81 mg daily.  2. Finasteride 5 mg one in a.m. and one at bedtime.  3. GlycoLax 17 g daily.  4. Hydrocodone 10/325 one to two p.o. q.4-6 h.  5. Insulin 43 units subcu q.a.m., 17 units at dinner.  6. Metoclopramide 5 mg as needed.  7. Metoprolol 25 mg b.i.d.  8. Multivitamin one daily.  9. Nitrostat 0.4 mg as needed.  10.Oxycodone ER 80 mg five times daily.  11.Plavix 75 mg daily.  12.Vancomycin per pharmacy protocol for 4-6 weeks.  13.Invanz IV per pharmacy protocol for 4-6 weeks.  14.Trazodone 150 mg nightly.  15.Amlodipine 10 mg daily.  16.Lipitor 10 mg nightly.   DIET:  Diabetic diet.   SPECIAL INSTRUCTIONS:  1. Nonweightbearing in full contact Bledsoe boot on the left leg.  2. Home health and wound care center care to begin on October 03, 2008,      at 9:30 a.m.   Follow up with Dr. Lestine [name] in 2 weeks.  Call office at [phone] for an  appointment.   LABORATORY DATA:  Routine labs on admission September 20, 2008, CBC white  count 7000, hemoglobin 12.8, hematocrit 37.2.   Coags on admission:  PT 13.2, INR 1.0, and PTT was 29.   Routine chemistries on admission:  Sodium 137, potassium 4.0, chloride  102, bicarb 26, glucose was 285, repeat was 198, BUN was 10, creatinine  was 1.3.   1. Abscess cultures from September 21, 2008, showed no growth x3 days.  2. Aerobic cultures on September 21, 2008, showed no anaerobes isolated.  3. Tissue culture showed few microaerophilic streptococci.   RADIOGRAPHS:  Left foot 3 views dated September 20, 2008, showed resolution  of dorsal soft tissue swelling.  No evidence of osteomyelitis.  Old  fracture deformity of proximal second metatarsal region was noted.  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  Generalized osteopenia and  small plantar calcaneal spur was noted.  No  acute fractures.   CONDITION:  The patient was discharged to home in good stable condition.      [name], P.A.      [name], M.D.  Electronically Signed    GC/MEDQ  D:  10/04/2008  T:  10/05/2008  Job:  [id number]   cc:   [name], M.D.

## 2010-06-22 NOTE — Assessment & Plan Note (Signed)
[facility]   NAME:  VALIN, MASSIE NO.:  [id number]   MEDICAL RECORD NO.:  [id number]      DATE OF BIRTH:  12-19-1945   PHYSICIAN:  Maxwell Caul, M.D.      VISIT DATE:                                   OFFICE VISIT   HISTORY:  Mr. Kicklighter is a 64 year old man with a 40-year history of type 1  diabetes with Charcot deformity.  He also has multiple sclerosis.  He  tells me that over the last year, he developed callus over the medial  aspect of his right foot.  Roughly a month ago, he developed pain and  erythema in the area and was actually seen at the emergency room at  [facility].  I believe he had an I&D of this area done.  He had oral Augmentin prescribed.  I have reviewed the cultures from  this time, which showed a few microaerophilic strep.  He returned home  only to develop more redness, pain, chills, and sweats, and he was  admitted to the hospital from September 08, 2008 through September 15, 2008.  He  was also taken to the operating room on the September 08, 2008, by Dr.  Shon Baton.  He had an I&D of the ulcer at that point.  The operative note  by Dr. Shon Baton on September 08, 2008, makes reference to incisions through  necrotic infected tissue and noted there to be a significant pus pocket.  I believe the patient was discharged home.  However, the home health  nurse once again called about increasing drainage and deterioration of  the wound, and the patient was again taken back to the OR on September 21, 2008, by Dr. Ranell Patrick.  The patient was taken back to the OR.  The patient  had an incision done with debridement of all nonviable tissue.  One  dissection was done under the metatarsal into the deep plantar tissue  space.  The purulence encountered there was basically cloudy and bloody  fluid.  All of this was cultured.   I have reviewed all the cultures including his blood cultures and  intraoperative wound cultures and except for the  wound on September 08, 2008, the culture grew microaerophilic strep.  All of his other cultures  were negative.  He is at home now receiving IV vancomycin and Avelox.  He is having the wounds cleaned with I believe silver alginate, Kerlix,  and an Ace.  He has a Cam walker, and he walks sparingly.   Past medical history is reviewed, includes type 1 diabetes on insulin  with a history of diabetic neuropathy and Charcot feet.  He also has  multiple sclerosis, diabetic amyotrophy.  He has a history of  hypertension, coronary artery disease, B12 deficiency, and has had an  acute inferior MI with a stent in 2005, although his ejection fraction  was maintained at 60%.   Surgically, he has a history of a submandibular gland removed and  bilateral carpal tunnel repair.   SOCIAL HISTORY:  The patient is married.  He is a nonsmoker.   Medication list is reviewed.  He is on Plavix and aspirin, but no other  medications  that should be affecting wound healing.   On examination, his temperature is 98.  He does not look to be septic.  The wounds are located on the left medial foot and on the lateral  aspect.  There are 2 wounds present.  Both of these appear to be in  satisfactory condition.  There is no gross purulence.  There is a  considerable amount of Charcot deformity and also erythema around the  wounds, which I think is still indicative of some degree of infection.  His peripheral pulses are palpable.   IMPRESSION:  Wegener grade 3 diabetic foot wounds with significant soft  tissue infection.  I have reviewed his cultures with the results above.  He only cultured a few microaerophilic streptococcus out of the culture  from September 08, 2008.  His intraoperative cultures on September 21, 2008,  were negative both anaerobic and aerobic.  His blood cultures were  negative.  He did not have an MRI, although he had several plain x-rays  of the area that did not show soft tissue gas or evidence of   osteomyelitis.   We continued the dressings that are being done by home health, which I  believe include silver alginate, Kerlix, and an Ace wrap.  Although, he  did not grossly culture anaerobes, I wonder if his antibiotics might be  more appropriately changed to Zosyn, which would have good anaerobic  coverage.  I will need to follow the wound serially to see if the  current antibiotics are effective.  As mentioned, there is still a  moderate amount of erythema around the wounds and the current  antibiotics were supposed to run for a 6-week total according to the  patient.   Finally, I think the patient is likely to be a candidate for hyperbaric  oxygen  therapy based on a limb threatening diabetic foot infection with  considerable soft tissue involvement at surgery, although he does not  definitively have osteomyelitis, it is still possible based on my review  of his hospital course.  Finally, he will likely need a total contact  cast at some point to result in total resolution of these wounds,  although that is certainly not now while there is still evidence of  active infection.           ______________________________  Maxwell Caul, M.D.     MGR/MEDQ  D:  10/03/2008  T:  10/04/2008  Job:  [id number]

## 2010-06-25 NOTE — Consult Note (Signed)
NAME:  [name]                           ACCOUNT NO.:  [id number]   MEDICAL RECORD NO.:  [id number]                   PATIENT TYPE:  INP   LOCATION:  2920                                 FACILITY:  [facility]   PHYSICIAN:  Marlan Palau, M.D.               DATE OF BIRTH:  1946/02/02   DATE OF CONSULTATION:  10/20/2003  DATE OF DISCHARGE:                                   CONSULTATION   HISTORY OF PRESENT ILLNESS:  [name] is a 65 year old right-handed  white male born 1945/04/05 with a history of multiple sclerosis and  severe diabetic peripheral neuropathy followed through [facility].  This patient has come into the hospital with what he thought  was abdominal gas pain and was found to have an anterior wall MI during this  admission.  Patient has had a troponin I level of 25.85.  Patient required  cardiac catheterization with stenting vessels.  Patient has had some  alteration in his narcotic regimen following admission and has had some  troubles with withdrawal symptoms, increased pain.  Patient claims, however,  that this has since been corrected and his pain level is markedly improved.  Patient denies any new numbness, new weakness, or new changes in balance.  Patient has already been up walking with a cane with just his baseline.  Neurology is asked to see this patient for further evaluation.   PAST MEDICAL HISTORY:  1.  History of diabetes.  2.  New onset MI as above.  3.  History of MS.  4.  Severe diabetic peripheral neuropathy.  5.  History of hypertension.  6.  History of bilateral carpal tunnel syndrome surgery.  7.  History of right foot fracture.  8.  History of groin surgery in the past for scar tissue.   MEDICATIONS:  1.  Aspirin 325 mg daily.  2.  Plavix 75 mg daily.  3.  Cardura 4 mg q.h.s.  4.  Flonase nasal spray daily.  5.  Patient had been using Afrin nasal spray on a regular basis.  6.  Lantus insulin 30 units  subcutaneous q.h.s.  7.  NPH insulin 43 units subcutaneous daily a.c. and 16 units NPH h.s.  8.  Novolin R 15 units subcutaneous q.4h. as needed.  9.  Prinivil 10 mg daily.  10. OxyContin 80 mg q.6h.  11. Desyrel 150 mg q.h.s.  12. Tylenol if needed.  13. Vicodin one and a half tablets q.6h. as needed.   ALLERGIES:  Patient, again, has no known allergies.   HABITS:  Does not smoke or drink.   SOCIAL HISTORY:  This patient lives in the [place] area.  Is married.  Has a daughter that works in this area.  Patient is retired.   FAMILY HISTORY:  Notable for the fact that both parents are living and  relatively good health.  The  father does have history of coronary artery  disease.   REVIEW OF SYSTEMS:  Notable for some chills during this admission.  Patient  denies headache, neck stiffness or neck pain.  Denies shortness of breath.  Has had occasional twinges of chest pain since admission, but nothing  severe.  Denies nausea or vomiting, problems controlling the bowels or  bladder.  Does note some chronic numbness of the legs, pain in the legs.  Denies any new numbness or weakness on the face, arms, or legs, dizziness,  blackout episodes.   PHYSICAL EXAMINATION:  VITAL SIGNS:  Blood pressure 123/61, heart rate 63,  respiratory rate 16, temperature afebrile.  GENERAL:  This patient is a fairly well-developed white male who is alert  and cooperative at the time of examination.  HEENT:  Head is atraumatic.  Eyes:  Pupils are equal, round, and reactive to  light.  Disks are flat bilaterally.  NECK:  Supple.  No carotid bruits noted.  RESPIRATORY:  Clear.  CARDIOVASCULAR:  Regular rate and rhythm.  No obvious murmurs or rubs noted.  EXTREMITIES:  Without significant edema.  NEUROLOGIC:  Cranial nerves as above.  Facial symmetry is present.  Patient  has good sensation to face to pin prick, soft touch bilaterally.  Has good  strength to facial muscles, the muscles to  head turning and shoulder shrug  bilaterally.  Speech is well enunciated, not aphasic.  Motor testing reveals  fairly good strength on all fours.  Good symmetric motor tone is noted  throughout.  The patient has a prominent stocking/glove pin prick sensory  deficit two-thirds the way up the legs bilaterally.  Vibratory sensation is  markedly impaired in the feet.  Patient has good pin prick, soft touch,  vibratory sensation in the arms.  Patient has fair finger-nose-finger, toe-  to-finger bilaterally.  Patient was not ambulated.  No drift is seen.  Deep  tendon reflexes are present, symmetric in the arms, depressed at the knees,  and absent at the ankles bilaterally.  Toes are neutral bilaterally.   LABORATORIES:  Notable for troponin I level of 25.85, total CK 495, MB  fraction 50.3.  Patient has a white count of 8.2, hemoglobin of 10.9,  hematocrit of 30.4, MCV of 100.6, platelets of 213.  Patient has sodium 140,  potassium 3.9, chloride 104, CO2 30, glucose 114, BUN 16, creatinine 0.9,  calcium 8.5.  Hemoglobin A1C is 7.6.   IMPRESSION:  1.  History of acute myocardial infarction this admission.  2.  Multiple sclerosis.  3.  Diabetes.  4.  Diabetic peripheral neuropathy with severe pain.   This patient is doing quite well from a neurologic standpoint at this point.  Patient is back on his OxyContin on a scheduled basis, doing very well with  this.  His neurologic status is stable.  I have nothing further to add at  this point.  Will check in on him off and on while he is in the hospital.  Patient seems to be recovering nicely from his MI.                                               C. Lesia Sago, M.D.    CKW/MEDQ  D:  10/20/2003  T:  10/20/2003  Job:  [id number]   cc:   Geoffry Paradise, M.D.  [address]  [place]  [place] [address]  Fax: [phone]

## 2010-06-25 NOTE — H&P (Signed)
NAME:  [name], [name]                           ACCOUNT NO.:  [id number]   MEDICAL RECORD NO.:  [id number]                   PATIENT TYPE:  INP   LOCATION:  1827                                 FACILITY:  [facility]   PHYSICIAN:  Arturo Morton. Riley Kill, M.D. [facility]         DATE OF BIRTH:  09/15/1945   DATE OF ADMISSION:  10/18/2003  DATE OF DISCHARGE:                                HISTORY & PHYSICAL   CHIEF COMPLAINT:  Chest pain   [name] is a 65 year old male patient with no known history of coronary  artery disease.  He was awakened this morning at 3 a.m. with substernal  chest pain associated with shortness of breath.  EMS was called, and he was  brought to the emergency room.  EKG revealed ST segment elevation in the  anterior leads consistent with acute anterior myocardial infarction.  Chest  x-ray revealed no active disease, no mediastinal widening.  Troponin has  elevated to 1.76.  He has been started on IV heparin, IV nitroglycerin.  He  has been given 4 baby aspirin, 5 mg IV Lopressor, and IV morphine.  He will  be taken to the cardiac catheterization lab emergently.   PAST MEDICAL HISTORY:  1.  Diabetes mellitus, insulin dependent.  2.  Multiple sclerosis.  3.  Hypertension.   ALLERGIES:  No known drug allergies.   MEDICATIONS:  1.  OxyContin 80 mg four times a day.  2.  Norco for breakthrough pain as  needed.  3.  Insulin, Humulin N 43 units in the morning, 16 units in the evening.  4.  Monopril 1/2 tablet daily (dose unknown).  5.  Cylert 1/2 tablet daily.  6.  Doxazosin q.h.s., dose unknown.  7.  Desyrel 150 mg q.h.s.   SOCIAL HISTORY:  He lives in [place] with his wife and 49 year old son.  He  is a retired [employer] operational officer.  He quit smoking 17 years ago, denies any  alcohol or illicit drug use.  He does try to follow a diabetic diet.   FAMILY HISTORY:  Both parents are alive and in their 40s.  His mom is alive  and well.  Hid dad does have coronary artery disease.   REVIEW OF SYSTEMS:  As above, otherwise negative.   PHYSICAL EXAMINATION:  VITAL SIGNS: Temperature 96.2, pulse 82, respirations  16, blood pressure on arrival 177/82, O2 saturation 99% on 2 liters.  GENERAL:  He is anxious and appears ill, complaining of substernal chest  pain radiating into his back and left arm.  He is short of breath.  HEENT:  Grossly normal.  No carotid bruits, JVD, or thyromegaly.  CHEST:  Clear to auscultation bilaterally.  No wheeze or rhonchi.  HEART:  Regular rate and rhythm.  No murmur.  ABDOMEN:  Good bowel sounds, nondistended, nontender.  EXTREMITIES:  No clubbing, cyanosis, or edema.  Palpable PT pulses  bilaterally.  NEUROLOGIC:  Cranial nerves II-XII  grossly intact.  He is alert and  oriented, although he is very anxious and scared.   LABORATORY AND X-RAY DATA:  Chest x-ray:  No active disease.  There is no  mediastinal widening on review of chest x-ray by myself and Dr. Dorethea Clan.   EKG reveals normal sinus rhythm, rate 77, with ST segment elevation V1  through V3, 1 to 2 mm.   Lab studies reveal normal hemoglobin and hematocrit.  Potassium 4.5, BUN 16,  creatinine 1.0.  Glucose is elevated at 259.  He is a known diabetic.  Troponin is elevated at 1.76, myoglobin greater than 500, MB 28.   IMPRESSION:  1.  Acute anterior myocardial infarction.  2.  Diabetes mellitus, insulin dependent.  3.  Multiple sclerosis.   PLAN:  1.  We will continue IV heparin, IV nitroglycerin, and continue to give IV      Lopressor as needed.  2.  IV morphine as needed for pain.  3.  He will be taken to the catheterization lab emergently by Dr. Bonnee Quin.   The patient was seen and examined by Dr. Dionicio Stall.      Guy Franco, P.A. [facility]                      Thomas D. Riley Kill, M.D. [facility]    LB/MEDQ  D:  10/18/2003  T:  10/18/2003  Job:  [id number]   cc:   Molly Maduro L. Foy Guadalajara, M.D.  [address] [place]  [place] [address]  Fax: [phone]   Geoffry Paradise,  M.D.  [address]  [place]  [place] [address]  Fax: [phone]

## 2010-06-25 NOTE — Cardiovascular Report (Signed)
NAME:  [name], [name]                           ACCOUNT NO.:  [id number]   MEDICAL RECORD NO.:  [id number]                   PATIENT TYPE:  INP   LOCATION:  2920                                 FACILITY:  [facility]   PHYSICIAN:  Arturo Morton. Riley Kill, M.D. [facility]         DATE OF BIRTH:  09-08-1945   DATE OF PROCEDURE:  10/18/2003  DATE OF DISCHARGE:                              CARDIAC CATHETERIZATION   INDICATIONS:  Mr. Wholey is a 65 year old gentleman who has diabetes and MS.  He developed chest pain in the middle of the night and came to the emergency  room at [facility] where enzymes were positive without diagnostic  electrocardiography.  He was seen by Dr. Dorethea Clan and referred to the  catheterization laboratory for urgent catheterization and possible  reperfusion therapy.  The risks, benefits and alternatives were discussed  with the patient and he was agreeable to proceed.   PROCEDURES:  1.  Left heart catheterization.  2.  Selective coronary arteriography.  3.  Selective left ventriculography.  4.  Percutaneous stenting of the right coronary artery.   DESCRIPTION OF PROCEDURE:  The patient was brought to the catheterization  laboratory and prepped and draped in the usual fashion.  Through an anterior  puncture, the right femoral artery was entered.  A 6 French sheath was  placed.  A CT was checked.  Views of the left and right coronary arteries  were obtained in multiple angiographic projections.  Central aortic and left  ventricular pressures were measured with a pigtail.  Ventriculography was  performed in the RAO projection.  The patient had well-preserved overall LV  function without a definite wall motion abnormality.  The LAD itself was  segmentally calcified and diffusely diseased, but without a high-grade focal  specific location of critical narrowing.  Likewise, the circumflex was  without significant focal specific disease.  The right coronary artery had  multiple lesions in the  distal branch vessels and there was an 80% focal  lesion near the junction of the mid and distal vessel.  We were not entirely  sure that this was the culprit, but the right coronary lesion itself  appeared to be high grade and the potential source of plaque rupture.  We  elected to go ahead at this point and stent this specific vessel.  LV  function was well preserved as noted and there were not diagnostic  electrocardiographic abnormalities.  A JR4 guiding catheter with side holes  was utilized as was a 7 [place] sheath.  There was some mild continued oozing  at the site.  The JR4 with side holes was entered into the right coronary  artery and the lesion crossed with a high-torque floppy wire.  Heparin and  Integrillin had been given according to protocol and ACT check.  ACT was  adequate for intervention.  Predilatation was done with a 2.5 x 15 Maverick.  The vessel was then stented using  a 25 x 18 CYPHER, which we knew was  undersized.  However, we were concerned with the diffuseness of the disease  above the stent site that perhaps 3.0 would be oversized.  The stent was  taken to 16 atmospheres with good apposition.  Following this, a 3.25  Quantum Maverick was utilized to post dilate the stent, particularly at the  most distal end.  We carefully looked at the distal branch vessels and none  of them appeared to be subtotally occluded, so therefore we decided to leave  this alone.  The patient was also tolerating the procedure fairly poorly  because of his musculoskeletal complaints.  OxyContin had been obtained from  the pharmacy and prescribed for the patient.  With the successful appearance  of the artery, the catheters were all removed and the femoral sheath sewn  into place.  The patient was taken to the CCU in satisfactory clinical  condition.   HEMODYNAMIC DATA:  1.  Central aortic pressure 149/68, mean 101.  2.  Left ventricle 159/19.  3.  No gradient on pullback across the  aortic valve.   ANGIOGRAPHIC DATA:  1.  Ventriculography was performed in the RAO projection.  There was no      definite wall motion abnormality and overall systolic function appeared      to be well preserved with an ejection fraction clearly in excess of 60%.  2.  The coronaries were generally calcified and this was noted on      fluoroscopy.  3.  The left main was without critical narrowing.  4.  The LAD has diffuse abnormalities throughout and a typical diabetic      appearance.  5.  Right at the ostium there is probably about 40%  narrowing, although      this does not appear to be high grade and is fairly smooth.  There is      then this long area of segmental plaquing before the diagonal      bifurcation, at least in the RAO views.  It is slightly hypodense in the      LAO views, but this area appears to have at least about 50% luminal      reduction with an MLD that appears to be in the range of about 2 mm.      There is 30-40% segmental plaquing after the major diagonal.  The major      diagonal has at least 40% ostial narrowing, which is again difficult to      grade.  None of these lesions appeared to be subtotal and there was good      distal TIMI-3 flow.  6.  The circumflex provides two major marginal branches.  Other than the      typical diabetic appearance of mild luminal irregularity throughout,      there was no evidence of high-grade focal narrowing in any of the      vessels.  7.  The right coronary artery is a large caliber vessel with a fair amount      of luminal irregularity.  There is about an 80% area of focal eccentric      disease just in the distal portion of the mid vessel.  Proximal to this      is an area of diffuse luminal irregularity that is evident.  The distal      vessel was without focal narrowing.  There is a small acute marginal     branch,  which is the first branch, which has about 70% ostial narrowing.      Following this is a fairly large  PDA which has diffuse segmental disease      of probably around 50% throughout the proximal and mid portion of this      vessel.  The next vessel is a smaller posterolateral branch that has      diffuse 70% narrowing in the proximal portion.  None of these vessels      distally are idea for grafting, the PDA being really the only vessel      that appears to be large enough for a good graft.  The second      posterolateral branch is a small caliber vessel without narrowing.  8.  The 80% stenosis was predilated with a 2.5 mm balloon and then stented      with a 2.5 x 18 CYPHER stent.  This was post dilated to 3.25 throughout      the predominant portion of the stent and then in the proximal portion      dilated only to about 3 mm because of the segmental area of disease      above it.  TIMI-3 flow was achieved with this.   CONCLUSIONS:  1.  Well-preserved left ventricular function.  2.  High-grade lesion of the distal portion of the mid right coronary artery      with successful stenting using a CYPHER drug-alluding stent.  3.  Diffuse diabetic abnormalities throughout the entire coronary tree with      diffuse calcification and segmental plaquing throughout, but noncritical      disease.   DISPOSITION:  1.  The patient will require aspirin and Plavix, preferably for one year if      he tolerates it.  2.  We will get Dr. Jacky Kindle to see the patient while he is hospitalized.  3.  He will need a followup in the [place], [place], office with      an anticipated length of stay of about three days.  4.  Cardiac rehabilitation with risk factor reduction will be highly      recommended.                                               Arturo Morton. Riley Kill, M.D. [facility]    TDS/MEDQ  D:  10/18/2003  T:  10/19/2003  Job:  [id number]   cc:   Marlan Palau, M.D.  [address]  [address]  [place]  [place] [address]  Fax: [phone]   Geoffry Paradise, M.D.  [address]  [place]  [place]  [address]  Fax: [phone]   Doris Cheadle. Foy Guadalajara, M.D.  [address] [place]  [place] [address]  Fax: [phone]   Arturo Morton. Riley Kill, M.D. [facility]   Vida Roller, M.D.  Fax: [phone]   [facility]

## 2010-06-25 NOTE — Discharge Summary (Signed)
NAME:  [name], [name]                           ACCOUNT NO.:  [id number]   MEDICAL RECORD NO.:  [id number]                   PATIENT TYPE:  INP   LOCATION:  2029                                 FACILITY:  [facility]   PHYSICIAN:  Delton See, P.A. [facility]            DATE OF BIRTH:  12/18/1945   DATE OF ADMISSION:  10/18/2003  DATE OF DISCHARGE:  10/21/2003                                 DISCHARGE SUMMARY   HISTORY OF PRESENT ILLNESS:  This is a 65 year old male with no known  history of coronary artery disease.  He was admitted to [facility]  on October 18, 2003 after he awoke that morning with chest pain.  He was  taken emergently to the cardiac catheterization laboratory.   PAST MEDICAL HISTORY:  1.  Diabetes mellitus.  2.  Multiple sclerosis.  3.  Hypertension.   ALLERGIES:  No known drug allergies.   SOCIAL HISTORY:  The patient lives in [place] with his wife and 61 year old  son.  He is a retired [employer] operational officer.  He denies any alcohol or drug use.  He does try to follow a diabetic diet.   FAMILY HISTORY:  Both parents are alive in their 26s.  His father has a  history of coronary artery disease.   HOSPITAL COURSE:  As noted, this patient was admitted to [facility]  through the emergency room on October 18, 2003, after he awoke with chest  pain.  He did rule in for an MI with positive cardiac enzymes.  He was taken  emergently to the cardiac catheterization laboratory by Dr. Riley Kill on  October 18, 2003,  he was found to have an 80% RCA lesion which was  stented to 0.  He had other diffuse 40-70% lesion.  His ejection fraction  was estimated to be greater than 60%.  Please see dictated catheterization  report for complete details.  Apparently he had a plaque rupture in his RCA  which was the etiology of his MI.   The patient did well following his procedure.  His medications were  adjusted.  He was followed closely over the next several days.   An  internal medicine consult was obtained on October 19, 2003, for  assistance with the patient's diabetes, chronic nasal congestion, and  neuropathy . It was felt that the patient may need an outpatient ENT consult  for his chronic nasal congestion.  He was treated with saline and Flonase  while in the hospital.   A neurology consult was obtained on October 20, 2003.  The consult was  from Dr. Lesia Sago for evaluation of multiple sclerosis and diabetic  neuropathy.  His medications were adjusted.  He was felt to be stable from a  neurological standpoint.  He would follow up on an outpatient basis.   Arrangements were eventually made to discharge the patient in stable and  improved condition on October 21, 2003, with early followup plan with Dr.  Riley Kill.   LABORATORY DATA:  On the day of discharge, hemoglobin was 10.9, hematocrit  30.5, WBC 8000, platelets 224,000.  On admission, hemoglobin had been 13.7,  hematocrit 40.3.  Chemistry profile on October 21, 2003, revealed BUN of  16, creatinine 0.9, potassium 3.8.  Glucose was low at 34.  Cardiac enzymes  were elevated.  On October 18, 2003, a troponin was 28.85, CK was 495, MB  was 50.  Hemoglobin A1c was 7.6.  A chest x-ray showed minimal bibasilar  atelectasis and scarring but no acute findings.   DISCHARGE MEDICATIONS:  1.  OxyContin as previously taken for pain.  2.  Norco as previously taken for pain.  3.  Humulin insulin 43 units a.m., 60 units p.m.  4.  Monopril as previously taken; dosage not available.  Apparently 1/2      tablet daily.  5.  Cylert was to be discontinued.  6.  Doxazosin at bedtime as previously taken.  7.  Desyrel 150 mg at bedtime.  8.  Metoprolol was new, 12.5 mg b.i.d.  9.  Coated aspirin 81 mg.  10. Plavix 75 mg daily.  11. Nitroglycerin p.r.n. for chest pain.  12. Tylenol p.r.n. for other pain.   DISCHARGE INSTRUCTIONS:  The patient was told to avoid any strenuous  activity, no driving  for two days.  He was to be on a low salt, low fat,  diabetic diet.  He was told to call the office if he had any increased pain,  swelling, or bleeding from his groin.  He was to see Dr. Riley Kill November 11, 2003, at 11:30 a.m., Dr. Foy Guadalajara as needed or as scheduled, Dr. Anne Hahn as  instructed and Dr. Jacky Kindle as needed or as scheduled.   DISCHARGE DIAGNOSES:  1.  Acute myocardial infarction.  2.  Status post percutaneous transluminal coronary angioplasty stenting of      the right coronary artery performed emergently on the day of admission.      Ejection fraction 60% at the time of catheterization.  3.  Diabetes mellitus with neuropathy.  4.  Multiple sclerosis.  5.  Hypertension.  6.  Anemia with outpatient CBC recommended at next office visit.  7.  Hypertension.                                                Delton See, P.A. [facility]    DR/MEDQ  D:  10/21/2003  T:  10/21/2003  Job:  [id number]   cc:   Molly Maduro L. Foy Guadalajara, M.D.  [address] [place]  [place] [address]  Fax: [phone]   Geoffry Paradise, M.D.  [address]  [place]  [place] [address]  Fax: [phone]

## 2010-06-25 NOTE — Assessment & Plan Note (Signed)
[facility]                            CARDIOLOGY OFFICE NOTE   NAME:Benning, DOSSIE SWOR                        MRN:          [id number]  DATE:01/06/2006                            DOB:          09-May-1945    Mr. Messineo is in for followup.  He is stable, he has not been having  significant chest pain.  Fortunately, his hemoglobin A1c was recently  6.8.  He is not smoking.  He has been following up with Dr. Jacky Kindle on a  regular basis.  He has recently had a physical done, and apparently his  lipid status is adequate.   He has not been having chest pain or significant shortness of breath.  He had a drug-eluting stent placed in the right coronary artery and has  diabetes.   PHYSICAL EXAMINATION:  The blood pressure is 130/70, the pulse is 63.  LUNGS:  The lung fields are clear.  CARDIAC:  Rhythm is regular.  There is not a significant murmur.   The electrocardiogram demonstrates normal sinus rhythm, essentially  within normal limits.   IMPRESSION:  1. Coronary artery disease, status post percutaneous stenting of the      distal right coronary artery.  2. Multivessel diffuse luminal irregularities.  3. Insulin-dependent diabetes.   PLAN:  1. He will continue on amlodipine, and also continue on aspirin and      Plavix at the present time.  2. We will see him back in followup in 1 year's time, or sooner should      further problems arise.  3. Continued risk factor reduction is recommended.     Arturo Morton. Riley Kill, MD, [facility]  Electronically Signed    TDS/MedQ  DD: 01/06/2006  DT: 01/08/2006  Job #: [id number]

## 2011-02-09 ENCOUNTER — Other Ambulatory Visit: Payer: Self-pay

## 2011-03-31 DIAGNOSIS — I6350 Cerebral infarction due to unspecified occlusion or stenosis of unspecified cerebral artery: Secondary | ICD-10-CM | POA: Insufficient documentation

## 2011-03-31 DIAGNOSIS — E1142 Type 2 diabetes mellitus with diabetic polyneuropathy: Secondary | ICD-10-CM | POA: Insufficient documentation

## 2011-03-31 DIAGNOSIS — R269 Unspecified abnormalities of gait and mobility: Secondary | ICD-10-CM | POA: Insufficient documentation

## 2011-03-31 HISTORY — DX: Cerebral infarction due to unspecified occlusion or stenosis of unspecified cerebral artery: I63.50

## 2011-12-27 ENCOUNTER — Encounter (HOSPITAL_COMMUNITY): Payer: Self-pay | Admitting: Pharmacy Technician

## 2011-12-27 ENCOUNTER — Other Ambulatory Visit (HOSPITAL_COMMUNITY): Payer: Self-pay | Admitting: Orthopedic Surgery

## 2011-12-29 ENCOUNTER — Encounter (HOSPITAL_COMMUNITY): Payer: Self-pay

## 2011-12-29 ENCOUNTER — Ambulatory Visit (HOSPITAL_COMMUNITY)
Admission: RE | Admit: 2011-12-29 | Discharge: 2011-12-29 | Disposition: A | Discharge status: Home / Self Care | Payer: Medicare Other | Source: Ambulatory Visit | Attending: Orthopedic Surgery | Admitting: Orthopedic Surgery

## 2011-12-29 ENCOUNTER — Encounter (HOSPITAL_COMMUNITY)
Admission: RE | Admit: 2011-12-29 | Discharge: 2011-12-29 | Disposition: A | Discharge status: Home / Self Care | Payer: Medicare Other | Source: Ambulatory Visit | Attending: Orthopedic Surgery | Admitting: Orthopedic Surgery

## 2011-12-29 DIAGNOSIS — Z01818 Encounter for other preprocedural examination: Secondary | ICD-10-CM | POA: Insufficient documentation

## 2011-12-29 DIAGNOSIS — E119 Type 2 diabetes mellitus without complications: Secondary | ICD-10-CM | POA: Insufficient documentation

## 2011-12-29 DIAGNOSIS — J449 Chronic obstructive pulmonary disease, unspecified: Secondary | ICD-10-CM | POA: Insufficient documentation

## 2011-12-29 DIAGNOSIS — I10 Essential (primary) hypertension: Secondary | ICD-10-CM | POA: Insufficient documentation

## 2011-12-29 DIAGNOSIS — J4489 Other specified chronic obstructive pulmonary disease: Secondary | ICD-10-CM | POA: Insufficient documentation

## 2011-12-29 DIAGNOSIS — Z87891 Personal history of nicotine dependence: Secondary | ICD-10-CM | POA: Insufficient documentation

## 2011-12-29 HISTORY — DX: Other specified postprocedural states: Z98.890

## 2011-12-29 HISTORY — DX: Essential (primary) hypertension: I10

## 2011-12-29 HISTORY — DX: Other specified postprocedural states: R11.2

## 2011-12-29 HISTORY — DX: Multiple sclerosis: G35

## 2011-12-29 HISTORY — DX: Major depressive disorder, single episode, unspecified: F32.9

## 2011-12-29 HISTORY — DX: Depression, unspecified: F32.A

## 2011-12-29 LAB — COMPREHENSIVE METABOLIC PANEL
AST: 14 U/L (ref 0–37)
Albumin: 2.9 g/dL — ABNORMAL LOW (ref 3.5–5.2)
Alkaline Phosphatase: 166 U/L — ABNORMAL HIGH (ref 39–117)
BUN: 17 mg/dL (ref 6–23)
Chloride: 90 mEq/L — ABNORMAL LOW (ref 96–112)
Potassium: 4.4 mEq/L (ref 3.5–5.1)
Total Bilirubin: 0.3 mg/dL (ref 0.3–1.2)
Total Protein: 8 g/dL (ref 6.0–8.3)

## 2011-12-29 LAB — PROTIME-INR
INR: 0.94 (ref 0.00–1.49)
Prothrombin Time: 12.5 seconds (ref 11.6–15.2)

## 2011-12-29 LAB — CBC
HCT: 42.1 % (ref 39.0–52.0)
MCHC: 35.2 g/dL (ref 30.0–36.0)
Platelets: 483 10*3/uL — ABNORMAL HIGH (ref 150–400)
RDW: 12.8 % (ref 11.5–15.5)
WBC: 10.9 10*3/uL — ABNORMAL HIGH (ref 4.0–10.5)

## 2011-12-29 LAB — SURGICAL PCR SCREEN: Staphylococcus aureus: NEGATIVE

## 2011-12-29 LAB — APTT: aPTT: 34 seconds (ref 24–37)

## 2011-12-29 MED ORDER — CEFAZOLIN SODIUM-DEXTROSE 2-3 GM-% IV SOLR
2.0000 g | INTRAVENOUS | Status: AC
  Administered 2011-12-30: 2 g via INTRAVENOUS
  Filled 2011-12-29: qty 50

## 2011-12-29 NOTE — Progress Notes (Signed)
Spoke with pts wife about arrival time of 12:15 PM-verbalized understanding

## 2011-12-29 NOTE — Progress Notes (Signed)
Dr Michelle Piper notified of na 128. He requestes [employer]

## 2011-12-29 NOTE — Pre-Procedure Instructions (Signed)
[address]  12/29/2011   Your procedure is scheduled on:  12/30/11  Report to [facility] at pt to call in  AM.of surgery  Call this number if you have problems the morning of surgery: [phone]   Remember:   Do not eat food:After Midnight.    Take these medicines the morning of surgery with A SIP OF WATER: norvasc,proscar,hydrocodone,reglan,oxycodone   Do not wear jewelry, make-up or nail polish.  Do not wear lotions, powders, or perfumes. You may wear deodorant.  Do not shave 48 hours prior to surgery. Men may shave face and neck.  Do not bring valuables to the hospital.  Contacts, dentures or bridgework may not be worn into surgery.  Leave suitcase in the car. After surgery it may be brought to your room.  For patients admitted to the hospital, checkout time is 11:00 AM the day of discharge.   Patients discharged the day of surgery will not be allowed to drive home.  Name and phone number of your driver: family  Special Instructions: Shower using CHG 2 nights before surgery and the night before surgery.  If you shower the day of surgery use CHG.  Use special wash - you have one bottle of CHG for all showers.  You should use approximately 1/3 of the bottle for each shower.   Please read over the following fact sheets that you were given: Pain Booklet, Coughing and Deep Breathing, Blood Transfusion Information, MRSA Information and Surgical Site Infection Prevention

## 2011-12-29 NOTE — Progress Notes (Signed)
Dr Flonnie Overman called for ekg

## 2011-12-30 ENCOUNTER — Encounter (HOSPITAL_COMMUNITY): Payer: Self-pay | Admitting: *Deleted

## 2011-12-30 ENCOUNTER — Encounter (HOSPITAL_COMMUNITY): Payer: Self-pay | Admitting: Anesthesiology

## 2011-12-30 ENCOUNTER — Inpatient Hospital Stay (HOSPITAL_COMMUNITY)
Admission: RE | Admit: 2011-12-30 | Discharge: 2012-01-10 | DRG: 982 | Disposition: A | Discharge status: Skilled Nursing Facility | Payer: Medicare Other | Source: Ambulatory Visit | Attending: Orthopedic Surgery | Admitting: Orthopedic Surgery

## 2011-12-30 ENCOUNTER — Encounter (HOSPITAL_COMMUNITY)
Admission: RE | Disposition: A | Discharge status: Skilled Nursing Facility | Payer: Self-pay | Source: Ambulatory Visit | Attending: Orthopedic Surgery

## 2011-12-30 ENCOUNTER — Inpatient Hospital Stay (HOSPITAL_COMMUNITY): Payer: Medicare Other | Admitting: Anesthesiology

## 2011-12-30 DIAGNOSIS — M9080 Osteopathy in diseases classified elsewhere, unspecified site: Secondary | ICD-10-CM | POA: Diagnosis present

## 2011-12-30 DIAGNOSIS — G35 Multiple sclerosis: Secondary | ICD-10-CM | POA: Diagnosis present

## 2011-12-30 DIAGNOSIS — E1149 Type 2 diabetes mellitus with other diabetic neurological complication: Principal | ICD-10-CM | POA: Diagnosis present

## 2011-12-30 DIAGNOSIS — Z8673 Personal history of transient ischemic attack (TIA), and cerebral infarction without residual deficits: Secondary | ICD-10-CM

## 2011-12-30 DIAGNOSIS — F3289 Other specified depressive episodes: Secondary | ICD-10-CM | POA: Diagnosis present

## 2011-12-30 DIAGNOSIS — F329 Major depressive disorder, single episode, unspecified: Secondary | ICD-10-CM | POA: Diagnosis present

## 2011-12-30 DIAGNOSIS — E1169 Type 2 diabetes mellitus with other specified complication: Secondary | ICD-10-CM | POA: Diagnosis present

## 2011-12-30 DIAGNOSIS — M869 Osteomyelitis, unspecified: Secondary | ICD-10-CM | POA: Diagnosis present

## 2011-12-30 DIAGNOSIS — E11610 Type 2 diabetes mellitus with diabetic neuropathic arthropathy: Secondary | ICD-10-CM

## 2011-12-30 DIAGNOSIS — L98499 Non-pressure chronic ulcer of skin of other sites with unspecified severity: Secondary | ICD-10-CM | POA: Diagnosis present

## 2011-12-30 DIAGNOSIS — E119 Type 2 diabetes mellitus without complications: Secondary | ICD-10-CM

## 2011-12-30 DIAGNOSIS — E1142 Type 2 diabetes mellitus with diabetic polyneuropathy: Secondary | ICD-10-CM | POA: Diagnosis present

## 2011-12-30 DIAGNOSIS — I10 Essential (primary) hypertension: Secondary | ICD-10-CM | POA: Diagnosis present

## 2011-12-30 HISTORY — PX: ORIF ANKLE FRACTURE: SHX5408

## 2011-12-30 HISTORY — DX: Atherosclerotic heart disease of native coronary artery without angina pectoris: I25.10

## 2011-12-30 LAB — GLUCOSE, CAPILLARY
Glucose-Capillary: 147 mg/dL — ABNORMAL HIGH (ref 70–99)
Glucose-Capillary: 169 mg/dL — ABNORMAL HIGH (ref 70–99)
Glucose-Capillary: 238 mg/dL — ABNORMAL HIGH (ref 70–99)
Glucose-Capillary: 347 mg/dL — ABNORMAL HIGH (ref 70–99)

## 2011-12-30 LAB — BASIC METABOLIC PANEL
CO2: 29 mEq/L (ref 19–32)
Calcium: 9.6 mg/dL (ref 8.4–10.5)
GFR calc non Af Amer: 63 mL/min — ABNORMAL LOW (ref >90)
Potassium: 4.8 mEq/L (ref 3.5–5.1)
Sodium: 135 mEq/L (ref 135–145)

## 2011-12-30 SURGERY — OPEN REDUCTION INTERNAL FIXATION (ORIF) ANKLE FRACTURE
Anesthesia: General | Site: Foot | Laterality: Left | Wound class: Dirty or Infected

## 2011-12-30 MED ORDER — METOCLOPRAMIDE HCL 10 MG PO TABS: 5.0000 mg | ORAL_TABLET | Freq: Every day | ORAL | Status: DC | PRN

## 2011-12-30 MED ORDER — METHOCARBAMOL 500 MG PO TABS
500.0000 mg | ORAL_TABLET | Freq: Four times a day (QID) | ORAL | Status: DC | PRN
  Administered 2011-12-30 – 2012-01-08 (×8): 500 mg via ORAL
  Filled 2011-12-30 (×10): qty 1

## 2011-12-30 MED ORDER — INSULIN ASPART 100 UNIT/ML ~~LOC~~ SOLN
0.0000 [IU] | Freq: Three times a day (TID) | SUBCUTANEOUS | Status: DC
Start: 2011-12-31 — End: 2012-01-10
  Administered 2011-12-31: 5 [IU] via SUBCUTANEOUS
  Administered 2011-12-31: 3 [IU] via SUBCUTANEOUS
  Administered 2011-12-31: 5 [IU] via SUBCUTANEOUS
  Administered 2012-01-01 (×2): 3 [IU] via SUBCUTANEOUS
  Administered 2012-01-03: 7 [IU] via SUBCUTANEOUS
  Administered 2012-01-03: 5 [IU] via SUBCUTANEOUS
  Administered 2012-01-04: 3 [IU] via SUBCUTANEOUS
  Administered 2012-01-04: 7 [IU] via SUBCUTANEOUS
  Administered 2012-01-06: 8 [IU] via SUBCUTANEOUS
  Administered 2012-01-06: 3 [IU] via SUBCUTANEOUS
  Administered 2012-01-06: 08:00:00 via SUBCUTANEOUS
  Administered 2012-01-07: 7 [IU] via SUBCUTANEOUS
  Administered 2012-01-07: 3 [IU] via SUBCUTANEOUS
  Administered 2012-01-08: 5 [IU] via SUBCUTANEOUS
  Administered 2012-01-08: 3 [IU] via SUBCUTANEOUS
  Administered 2012-01-09: 8 [IU] via SUBCUTANEOUS
  Administered 2012-01-09: 2 [IU] via SUBCUTANEOUS
  Administered 2012-01-09: 3 [IU] via SUBCUTANEOUS
  Administered 2012-01-10: 5 [IU] via SUBCUTANEOUS

## 2011-12-30 MED ORDER — METHOCARBAMOL 100 MG/ML IJ SOLN
500.0000 mg | Freq: Four times a day (QID) | INTRAVENOUS | Status: DC | PRN
  Administered 2011-12-30: 500 mg via INTRAVENOUS
  Filled 2011-12-30: qty 5

## 2011-12-30 MED ORDER — OXYCODONE HCL 80 MG PO TB12: 80.0000 mg | ORAL_TABLET | Freq: Two times a day (BID) | ORAL | Status: DC

## 2011-12-30 MED ORDER — GENTAMICIN SULFATE 40 MG/ML IJ SOLN
INTRAMUSCULAR | Status: AC
  Filled 2011-12-30: qty 4

## 2011-12-30 MED ORDER — GENTAMICIN SULFATE 40 MG/ML IJ SOLN
INTRAMUSCULAR | Status: DC | PRN
  Administered 2011-12-30: 80 mg via INTRAMUSCULAR

## 2011-12-30 MED ORDER — OXYCODONE HCL ER 10 MG PO T12A
80.0000 mg | EXTENDED_RELEASE_TABLET | Freq: Two times a day (BID) | ORAL | Status: DC
  Administered 2011-12-30 – 2012-01-10 (×22): 80 mg via ORAL
  Filled 2011-12-30 (×15): qty 8
  Filled 2011-12-30: qty 1
  Filled 2011-12-30 (×4): qty 8
  Filled 2011-12-30: qty 7
  Filled 2011-12-30 (×2): qty 8
  Filled 2011-12-30: qty 7
  Filled 2011-12-30 (×5): qty 8
  Filled 2011-12-30: qty 1
  Filled 2011-12-30 (×3): qty 8

## 2011-12-30 MED ORDER — HYDROMORPHONE HCL PF 1 MG/ML IJ SOLN
0.2500 mg | INTRAMUSCULAR | Status: DC | PRN
  Administered 2011-12-30 (×2): 0.5 mg via INTRAVENOUS

## 2011-12-30 MED ORDER — ONDANSETRON HCL 4 MG/2ML IJ SOLN
INTRAMUSCULAR | Status: DC | PRN
  Administered 2011-12-30: 4 mg via INTRAVENOUS

## 2011-12-30 MED ORDER — CEFAZOLIN SODIUM-DEXTROSE 2-3 GM-% IV SOLR
2.0000 g | Freq: Four times a day (QID) | INTRAVENOUS | Status: AC
  Administered 2011-12-30 – 2011-12-31 (×3): 2 g via INTRAVENOUS
  Filled 2011-12-30 (×3): qty 50

## 2011-12-30 MED ORDER — PROPOFOL 10 MG/ML IV BOLUS
INTRAVENOUS | Status: DC | PRN
  Administered 2011-12-30: 200 mg via INTRAVENOUS

## 2011-12-30 MED ORDER — FENTANYL CITRATE 0.05 MG/ML IJ SOLN
INTRAMUSCULAR | Status: DC | PRN
  Administered 2011-12-30 (×2): 50 ug via INTRAVENOUS
  Administered 2011-12-30: 100 ug via INTRAVENOUS
  Administered 2011-12-30: 50 ug via INTRAVENOUS

## 2011-12-30 MED ORDER — METOCLOPRAMIDE HCL 5 MG/ML IJ SOLN: 5.0000 mg | Freq: Three times a day (TID) | INTRAMUSCULAR | Status: DC | PRN

## 2011-12-30 MED ORDER — MIDAZOLAM HCL 5 MG/5ML IJ SOLN
INTRAMUSCULAR | Status: DC | PRN
  Administered 2011-12-30: 2 mg via INTRAVENOUS

## 2011-12-30 MED ORDER — HYDROMORPHONE HCL PF 1 MG/ML IJ SOLN
INTRAMUSCULAR | Status: AC
  Filled 2011-12-30: qty 1

## 2011-12-30 MED ORDER — 0.9 % SODIUM CHLORIDE (POUR BTL) OPTIME
TOPICAL | Status: DC | PRN
  Administered 2011-12-30: 1000 mL

## 2011-12-30 MED ORDER — ONDANSETRON HCL 4 MG/2ML IJ SOLN: 4.0000 mg | Freq: Four times a day (QID) | INTRAMUSCULAR | Status: DC | PRN

## 2011-12-30 MED ORDER — VANCOMYCIN HCL 500 MG IV SOLR
INTRAVENOUS | Status: AC
  Filled 2011-12-30: qty 500

## 2011-12-30 MED ORDER — NITROGLYCERIN 0.4 MG SL SUBL: 0.4000 mg | SUBLINGUAL_TABLET | SUBLINGUAL | Status: DC | PRN

## 2011-12-30 MED ORDER — LACTATED RINGERS IV SOLN
INTRAVENOUS | Status: DC | PRN
  Administered 2011-12-30 (×2): via INTRAVENOUS

## 2011-12-30 MED ORDER — HYDROMORPHONE HCL PF 1 MG/ML IJ SOLN
0.2500 mg | INTRAMUSCULAR | Status: DC | PRN
  Administered 2011-12-30 (×4): 0.5 mg via INTRAVENOUS

## 2011-12-30 MED ORDER — METOPROLOL TARTRATE 25 MG PO TABS
25.0000 mg | ORAL_TABLET | Freq: Two times a day (BID) | ORAL | Status: DC
  Administered 2011-12-30 – 2012-01-10 (×22): 25 mg via ORAL
  Filled 2011-12-30 (×23): qty 1

## 2011-12-30 MED ORDER — SODIUM CHLORIDE 0.9 % IV SOLN
INTRAVENOUS | Status: DC
  Administered 2011-12-31: 06:00:00 via INTRAVENOUS

## 2011-12-30 MED ORDER — CLOPIDOGREL BISULFATE 75 MG PO TABS
75.0000 mg | ORAL_TABLET | Freq: Every day | ORAL | Status: DC
  Administered 2011-12-30 – 2012-01-10 (×12): 75 mg via ORAL
  Filled 2011-12-30 (×12): qty 1

## 2011-12-30 MED ORDER — HYDROMORPHONE HCL PF 1 MG/ML IJ SOLN
0.5000 mg | INTRAMUSCULAR | Status: DC | PRN
  Administered 2011-12-30 – 2012-01-01 (×7): 1 mg via INTRAVENOUS
  Filled 2011-12-30 (×8): qty 1

## 2011-12-30 MED ORDER — FINASTERIDE 5 MG PO TABS
5.0000 mg | ORAL_TABLET | Freq: Two times a day (BID) | ORAL | Status: DC
  Administered 2011-12-30 – 2012-01-10 (×22): 5 mg via ORAL
  Filled 2011-12-30 (×23): qty 1

## 2011-12-30 MED ORDER — LACTATED RINGERS IV SOLN
INTRAVENOUS | Status: DC
  Administered 2011-12-30: 14:00:00 via INTRAVENOUS

## 2011-12-30 MED ORDER — AMLODIPINE BESYLATE 10 MG PO TABS
10.0000 mg | ORAL_TABLET | Freq: Every day | ORAL | Status: DC
  Administered 2011-12-31 – 2012-01-10 (×11): 10 mg via ORAL
  Filled 2011-12-30 (×12): qty 1

## 2011-12-30 MED ORDER — TRAZODONE HCL 150 MG PO TABS
150.0000 mg | ORAL_TABLET | Freq: Every day | ORAL | Status: DC
  Administered 2011-12-30 – 2012-01-09 (×11): 150 mg via ORAL
  Filled 2011-12-30 (×12): qty 1

## 2011-12-30 MED ORDER — METOCLOPRAMIDE HCL 10 MG PO TABS: 5.0000 mg | ORAL_TABLET | Freq: Three times a day (TID) | ORAL | Status: DC | PRN

## 2011-12-30 MED ORDER — INSULIN ASPART 100 UNIT/ML ~~LOC~~ SOLN
0.0000 [IU] | Freq: Three times a day (TID) | SUBCUTANEOUS | Status: DC
  Administered 2011-12-30: 10 [IU] via SUBCUTANEOUS

## 2011-12-30 MED ORDER — INSULIN NPH (HUMAN) (ISOPHANE) 100 UNIT/ML ~~LOC~~ SUSP
42.0000 [IU] | Freq: Every day | SUBCUTANEOUS | Status: DC
  Administered 2011-12-31 – 2012-01-10 (×11): 42 [IU] via SUBCUTANEOUS
  Filled 2011-12-30: qty 10

## 2011-12-30 MED ORDER — INSULIN ASPART 100 UNIT/ML ~~LOC~~ SOLN
5.0000 [IU] | Freq: Every day | SUBCUTANEOUS | Status: DC
  Administered 2011-12-31: 5 [IU] via SUBCUTANEOUS
  Administered 2011-12-31: 3 [IU] via SUBCUTANEOUS
  Administered 2011-12-31 – 2012-01-03 (×2): 5 [IU] via SUBCUTANEOUS
  Administered 2012-01-03: 4 [IU] via SUBCUTANEOUS
  Administered 2012-01-07: 7 [IU] via SUBCUTANEOUS

## 2011-12-30 MED ORDER — VANCOMYCIN HCL 500 MG IV SOLR
INTRAVENOUS | Status: DC | PRN
  Administered 2011-12-30: 500 mg

## 2011-12-30 MED ORDER — INSULIN ASPART 100 UNIT/ML ~~LOC~~ SOLN
4.0000 [IU] | Freq: Three times a day (TID) | SUBCUTANEOUS | Status: DC
  Administered 2012-01-04 – 2012-01-10 (×6): 4 [IU] via SUBCUTANEOUS

## 2011-12-30 MED ORDER — INSULIN NPH (HUMAN) (ISOPHANE) 100 UNIT/ML ~~LOC~~ SUSP
17.0000 [IU] | Freq: Two times a day (BID) | SUBCUTANEOUS | Status: DC
  Filled 2011-12-30: qty 10

## 2011-12-30 MED ORDER — HYDROCODONE-ACETAMINOPHEN 10-325 MG PO TABS
1.0000 | ORAL_TABLET | ORAL | Status: DC | PRN
  Administered 2011-12-30 – 2012-01-07 (×8): 2 via ORAL
  Administered 2012-01-08: 1 via ORAL
  Administered 2012-01-09 (×2): 2 via ORAL
  Filled 2011-12-30 (×10): qty 2
  Filled 2011-12-30: qty 1
  Filled 2011-12-30: qty 2

## 2011-12-30 MED ORDER — ASPIRIN 81 MG PO TABS
81.0000 mg | ORAL_TABLET | Freq: Every day | ORAL | Status: DC
  Administered 2011-12-30 – 2011-12-31 (×2): 81 mg via ORAL
  Filled 2011-12-30 (×3): qty 1

## 2011-12-30 MED ORDER — LIDOCAINE HCL (CARDIAC) 20 MG/ML IV SOLN
INTRAVENOUS | Status: DC | PRN
  Administered 2011-12-30: 100 mg via INTRAVENOUS

## 2011-12-30 MED ORDER — INSULIN NPH (HUMAN) (ISOPHANE) 100 UNIT/ML ~~LOC~~ SUSP
17.0000 [IU] | Freq: Every day | SUBCUTANEOUS | Status: DC
  Administered 2011-12-30 – 2012-01-09 (×11): 17 [IU] via SUBCUTANEOUS
  Filled 2011-12-30: qty 10

## 2011-12-30 MED ORDER — ATORVASTATIN CALCIUM 10 MG PO TABS
10.0000 mg | ORAL_TABLET | Freq: Every day | ORAL | Status: DC
  Administered 2011-12-30 – 2012-01-10 (×12): 10 mg via ORAL
  Filled 2011-12-30 (×12): qty 1

## 2011-12-30 MED ORDER — ONDANSETRON HCL 4 MG PO TABS: 4.0000 mg | ORAL_TABLET | Freq: Four times a day (QID) | ORAL | Status: DC | PRN

## 2011-12-30 MED ORDER — ONDANSETRON HCL 4 MG/2ML IJ SOLN: 4.0000 mg | Freq: Once | INTRAMUSCULAR | Status: DC | PRN

## 2011-12-30 SURGICAL SUPPLY — 44 items
BANDAGE ESMARK 6X9 LF (GAUZE/BANDAGES/DRESSINGS) IMPLANT
BANDAGE GAUZE ELAST BULKY 4 IN (GAUZE/BANDAGES/DRESSINGS) ×2 IMPLANT
BLADE SAW SGTL 81X20 HD (BLADE) ×2 IMPLANT
BNDG COHESIVE 4X5 TAN STRL (GAUZE/BANDAGES/DRESSINGS) ×2 IMPLANT
BNDG ESMARK 6X9 LF (GAUZE/BANDAGES/DRESSINGS)
BNDG GAUZE STRTCH 6 (GAUZE/BANDAGES/DRESSINGS) ×2 IMPLANT
CLOTH BEACON ORANGE TIMEOUT ST (SAFETY) ×2 IMPLANT
COVER SURGICAL LIGHT HANDLE (MISCELLANEOUS) ×2 IMPLANT
CUFF TOURNIQUET SINGLE 34IN LL (TOURNIQUET CUFF) IMPLANT
CUFF TOURNIQUET SINGLE 44IN (TOURNIQUET CUFF) IMPLANT
DRAPE C-ARM MINI 42X72 WSTRAPS (DRAPES) ×2 IMPLANT
DRAPE INCISE IOBAN 66X45 STRL (DRAPES) ×2 IMPLANT
DRAPE PROXIMA HALF (DRAPES) ×2 IMPLANT
DRAPE U-SHAPE 47X51 STRL (DRAPES) ×2 IMPLANT
DRSG ADAPTIC 3X8 NADH LF (GAUZE/BANDAGES/DRESSINGS) ×2 IMPLANT
DRSG PAD ABDOMINAL 8X10 ST (GAUZE/BANDAGES/DRESSINGS) ×2 IMPLANT
DURAPREP 26ML APPLICATOR (WOUND CARE) ×2 IMPLANT
ELECT REM PT RETURN 9FT ADLT (ELECTROSURGICAL) ×2
ELECTRODE REM PT RTRN 9FT ADLT (ELECTROSURGICAL) ×1 IMPLANT
GLOVE BIOGEL PI IND STRL 9 (GLOVE) ×1 IMPLANT
GLOVE BIOGEL PI INDICATOR 9 (GLOVE) ×1
GLOVE SURG ORTHO 9.0 STRL STRW (GLOVE) ×2 IMPLANT
GOWN PREVENTION PLUS XLARGE (GOWN DISPOSABLE) ×2 IMPLANT
GOWN SRG XL XLNG 56XLVL 4 (GOWN DISPOSABLE) ×2 IMPLANT
GOWN STRL NON-REIN XL XLG LVL4 (GOWN DISPOSABLE) ×2
KIT BASIN OR (CUSTOM PROCEDURE TRAY) ×2 IMPLANT
KIT ROOM TURNOVER OR (KITS) ×2 IMPLANT
KIT STIMULAN RAPID CURE 5CC (Orthopedic Implant) ×2 IMPLANT
MANIFOLD NEPTUNE II (INSTRUMENTS) ×2 IMPLANT
NS IRRIG 1000ML POUR BTL (IV SOLUTION) ×2 IMPLANT
PACK ORTHO EXTREMITY (CUSTOM PROCEDURE TRAY) ×2 IMPLANT
PAD ARMBOARD 7.5X6 YLW CONV (MISCELLANEOUS) ×4 IMPLANT
PADDING CAST COTTON 6X4 STRL (CAST SUPPLIES) ×2 IMPLANT
SCREW CANN 7.3X140 32MM THRD (Screw) ×2 IMPLANT
SPONGE GAUZE 4X4 12PLY (GAUZE/BANDAGES/DRESSINGS) ×2 IMPLANT
SPONGE LAP 18X18 X RAY DECT (DISPOSABLE) ×2 IMPLANT
STAPLER VISISTAT 35W (STAPLE) IMPLANT
SUCTION FRAZIER TIP 10 FR DISP (SUCTIONS) ×2 IMPLANT
SUT ETHILON 2 0 PSLX (SUTURE) ×4 IMPLANT
SUT VIC AB 2-0 CTB1 (SUTURE) ×2 IMPLANT
TOWEL OR 17X24 6PK STRL BLUE (TOWEL DISPOSABLE) ×2 IMPLANT
TOWEL OR 17X26 10 PK STRL BLUE (TOWEL DISPOSABLE) ×2 IMPLANT
TUBE CONNECTING 12X1/4 (SUCTIONS) ×2 IMPLANT
WATER STERILE IRR 1000ML POUR (IV SOLUTION) IMPLANT

## 2011-12-30 NOTE — Op Note (Signed)
OPERATIVE REPORT  DATE OF SURGERY: 12/30/2011  PATIENT:  [name],  66 y.o. male  PRE-OPERATIVE DIAGNOSIS:  Abscess, Infection Charcot foot Left  POST-OPERATIVE DIAGNOSIS: Same  PROCEDURE:  Procedure(s): Open reduction internal fixation Charcot collapse left foot with excision of the medial cuneiform. As of antibiotic beads with stimulant 500 mg vancomycin 160 mg gentamicin. Local tissue rearrangement for closure of complex wound with wound length 10 cm.  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 He has failed prolonged conservative therapy he has radiographic evidence of osteomyelitis with ulceration which probes to bone and presents at this time for internal fixation and stabilization. Risks and benefits were discussed including infection neurovascular injury nonhealing of the wound nonhealing of the bone need for additional surgery potential for amputation. Patient states he understands was pursued this time. Description of procedure patient brought to the operating room and underwent general anesthetic. After adequate levels and anesthesia obtained patient's left lower extremity was prepped using DuraPrep draped into a sterile field. A medial longitudinal incision was made this was used to include the ulcer and the ulcer was ellipsed out with the surgical incision. This left an incision 10 cm in length and 3 cm in width. Ostectomy was performed and the medial cuneiform part of the navicular were excised as well as the base of the first metatarsal. The her Charcot collapse the Lisfranc joint was reduced and stabilized with a guidepin and then secured with a 140 mm 7.3 cannulated screw. C-arm fluoroscopy suite verified reduction in both AP and  lateral planes. The wound is irrigated with normal saline there is no purulence no abscess signs of any soft tissue infection. The incision was closed using 2-0 nylon the wound is covered with an AB dressing Kerlix and Coban. Patient was extubated taken to the PACU in stable condition.  PLAN OF CARE: Admit to inpatient   PATIENT DISPOSITION:  PACU - hemodynamically stable.   Nadara Mustard, MD 12/30/2011 4:03 PM

## 2011-12-30 NOTE — Anesthesia Postprocedure Evaluation (Signed)
Anesthesia Post Note  Patient: [name]  Procedure(s) Performed: Procedure(s) (LRB): OPEN REDUCTION INTERNAL FIXATION (ORIF) ANKLE FRACTURE (Left)  Anesthesia type: General  Patient location: PACU  Post pain: Pain level controlled and Adequate analgesia  Post assessment: Post-op Vital signs reviewed, [name] Cardiovascular Status Stable, Respiratory Function Stable, Patent Airway and Pain level controlled  Last Vitals:  Filed Vitals:   12/30/11 1730  BP: 165/61  Pulse: 71  Temp:   Resp: 20    Post vital signs: Reviewed and stable  Level of consciousness: awake, alert  and oriented  Complications: No apparent anesthesia complications

## 2011-12-30 NOTE — Transfer of Care (Signed)
Immediate Anesthesia Transfer of Care Note  Patient: [name]  Procedure(s) Performed: Procedure(s) (LRB) with comments: OPEN REDUCTION INTERNAL FIXATION (ORIF) ANKLE FRACTURE (Left) - Excision Bone, Abscess Left Charcot Foot, Place Antibiotic Beads, Fixation Charcot  Patient Location: PACU  Anesthesia Type:General  Level of Consciousness: awake, alert  and oriented  Airway & Oxygen Therapy: Patient Spontanous Breathing and Patient connected to nasal cannula oxygen  Post-op Assessment: Report given to PACU RN, Post -op Vital signs reviewed and stable and Patient moving all extremities X 4  Post vital signs: Reviewed and stable  Complications: No apparent anesthesia complications

## 2011-12-30 NOTE — Anesthesia Preprocedure Evaluation (Addendum)
Anesthesia Evaluation  Patient identified by MRN, date of birth, ID band Patient awake    Reviewed: Allergy & Precautions, H&P , NPO status , [name] Chart, lab work & pertinent test results  History of Anesthesia Complications (+) PONV  Airway Mallampati: I TM Distance: >3 FB Neck ROM: full  Mouth opening: Limited Mouth Opening  Dental   Pulmonary shortness of breath, COPDCurrent Smoker,          Cardiovascular hypertension, Pt. on medications + angina + CAD, + Peripheral Vascular Disease and + Orthopnea Rhythm:regular Rate:Normal     Neuro/Psych PSYCHIATRIC DISORDERS Anxiety Depression  Neuromuscular disease CVA    GI/Hepatic   Endo/Other  diabetes, Type 1, Insulin Dependent  Renal/GU      Musculoskeletal   Abdominal   Peds  Hematology   Anesthesia Other Findings MS  Reproductive/Obstetrics                         Anesthesia Physical Anesthesia Plan  ASA: III  Anesthesia Plan: General   Post-op Pain Management:    Induction: Intravenous  Airway Management Planned: LMA and Oral ETT  Additional Equipment:   Intra-op Plan:   Post-operative Plan: Extubation in OR  Informed Consent: I have reviewed the patients History and Physical, chart, labs and discussed the procedure including the risks, benefits and alternatives for the proposed anesthesia with the patient or authorized representative who has indicated his/her understanding and acceptance.     Plan Discussed with: CRNA, Anesthesiologist and Surgeon  Anesthesia Plan Comments:         Anesthesia Quick Evaluation

## 2011-12-30 NOTE — Progress Notes (Signed)
Dr. Katrinka Blazing called for more pain medication orders received

## 2011-12-30 NOTE — Progress Notes (Signed)
Pt arrived in severe pain crying medicated with hydromorphone CRNA at bedside gave pt Versed 2 Mg IVP Started to relax but having severe pain in left great toe

## 2011-12-30 NOTE — H&P (Signed)
[name] is an 66 y.o. male.   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 Diabetes mellitus without complication   . PONV (postoperative nausea and vomiting)   . Anginal pain     2003  . Shortness of breath   . Stroke   . Mental disorder   . Depression   . Hypertension     dr Jacky Kindle  . Neuropathy   . MS (multiple sclerosis)     Past Surgical History  Procedure Date  . Cardiac catheterization     2005  . Fracture surgery     rt foot    No family history on file. Social History:  reports that he has been smoking Cigars.  He does not have any smokeless tobacco history on file. He reports that he does not drink alcohol. His drug history not on file.  Allergies: No Known Allergies  No prescriptions prior to admission    Results for orders placed during the hospital encounter of 12/29/11 (from the past 48 hour(s))  SURGICAL PCR SCREEN     Status: Normal   Collection Time   12/29/11  2:50 PM      Component Value Range Comment   MRSA, PCR NEGATIVE  NEGATIVE    Staphylococcus aureus NEGATIVE  NEGATIVE   APTT     Status: Normal   Collection Time   12/29/11  2:51 PM      Component Value Range Comment   aPTT 34  24 - 37 seconds   CBC     Status: Abnormal   Collection Time   12/29/11  2:51 PM      Component Value Range Comment   WBC 10.9 (*) 4.0 - 10.5 K/uL    RBC 4.30  4.22 - 5.81 MIL/uL    Hemoglobin 14.8  13.0 - 17.0 g/dL    HCT 16.1  09.6 - 04.5 %    MCV 97.9  78.0 - 100.0 fL    MCH 34.4 (*) 26.0 - 34.0 pg    MCHC 35.2  30.0 - 36.0 g/dL    RDW 40.9  81.1 - 91.4 %    Platelets 483 (*) 150 - 400 K/uL   COMPREHENSIVE METABOLIC PANEL     Status: Abnormal   Collection Time   12/29/11  2:51 PM      Component Value Range Comment   Sodium 128 (*)  135 - 145 mEq/L    Potassium 4.4  3.5 - 5.1 mEq/L    Chloride 90 (*) 96 - 112 mEq/L    CO2 28  19 - 32 mEq/L    Glucose, Bld 287 (*) 70 - 99 mg/dL    BUN 17  6 - 23 mg/dL    Creatinine, Ser 7.82  0.50 - 1.35 mg/dL    Calcium 9.5  8.4 - 95.6 mg/dL    Total Protein 8.0  6.0 - 8.3 g/dL    Albumin 2.9 (*) 3.5 - 5.2 g/dL    AST 14  0 - 37 U/L    ALT 10  0 - 53 U/L    Alkaline Phosphatase 166 (*) 39 - 117 U/L    Total Bilirubin 0.3  0.3 - 1.2 mg/dL    GFR calc non Af Amer 63 (*) >90 mL/min    GFR calc Af Amer 73 (*) >  90 mL/min   PROTIME-INR     Status: Normal   Collection Time   12/29/11  2:51 PM      Component Value Range Comment   Prothrombin Time 12.5  11.6 - 15.2 seconds    INR 0.94  0.00 - 1.49    Dg Chest 2 View  12/29/2011  *RADIOLOGY REPORT*  Clinical Data: Preoperative evaluation for left hip surgery. Hypertension controlled medically.  Diabetes.  Former smoker.  CHEST - 2 VIEW  Comparison: 09/10/2008  Findings: Changes of underlying COPD with hyperinflation is noted. Taking this into consideration, heart and mediastinal contours are within normal limits.  Mild calcification is seen in the aortic arch.  The lung fields demonstrate coarseness of the interstitial markings compatible with underlying bronchitic change and this finding is stable.  Bilateral apical pleural thickening is unchanged.  No new areas of focal atelectasis or infiltrate are seen.  Linear scarring at the left lung base is again noted.  Bony structures appear intact.  IMPRESSION: Stable COPD changes with underlying bronchitic change.  No worrisome focal or acute abnormality identified   Original Report Authenticated By: Rhodia Albright, M.D.     Review of Systems  All other systems reviewed and are negative.    There were no vitals taken for this visit. 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  Plan: Will plan for excision of infected bone debridement of the wound stabilization internal fixation for the Charcot rocker-bottom deformity. Risks and benefits were discussed including infection neurovascular injury potential for amputation patient states he understands was pursued this time.  Yeslin Delio V 12/30/2011, 6:48 AM

## 2011-12-31 LAB — GLUCOSE, CAPILLARY: Glucose-Capillary: 180 mg/dL — ABNORMAL HIGH (ref 70–99)

## 2011-12-31 NOTE — Progress Notes (Signed)
Patient ID: [name], male   DOB: 12/07/1945, 66 y.o.   MRN: [id number] PATIENT ID: [name]        MRN:  [id number]          DOB/AGE: Nov 03, 1945 / 66 y.o.    [name] Campbell, MD   [name] Code, PA-C [address] [place], [place]  [address]                             [phone]   PROGRESS NOTE  Subjective:  negative for Chest Pain  negative for Shortness of Breath  negative for Nausea/Vomiting   negative for Calf Pain    Tolerating Diet: yes         Patient reports pain as moderate.     CONSIDERABLE PAIN LAST NIGHT, BUT BETTER THIS AM..DRESSING DRY  Objective: Vital signs in last 24 hours:   Patient Vitals for the past 24 hrs:  BP Temp Temp src Pulse Resp SpO2 Height Weight  12/31/11 0600 148/50 mmHg 98.6 F (37 C) - 69  18  95 % - -  12/30/11 2200 179/63 mmHg 99.1 F (37.3 C) - 90  18  97 % - -  12/30/11 1755 182/63 mmHg 97.8 F (36.6 C) Oral 85  18  99 % 6' (1.829 m) 91.173 kg (201 lb)  12/30/11 1730 165/61 mmHg 98.2 F (36.8 C) - 71  20  100 % - -  12/30/11 1715 164/64 mmHg - - 71  18  100 % - -  12/30/11 1700 171/65 mmHg - - 73  28  96 % - -  12/30/11 1645 164/64 mmHg - - 71  21  97 % - -  12/30/11 1630 180/80 mmHg - - 72  18  94 % - -  12/30/11 1240 - - - - - - 6' (1.829 m) 91.173 kg (201 lb)  12/30/11 1219 169/72 mmHg 98.5 F (36.9 C) Oral 86  18  97 % - -      Intake/Output from previous day:   11/22 0701 - 11/23 0700 In: 1000 [I.V.:1000] Out: 330 [Urine:300]   Intake/Output this shift:       Intake/Output      11/22 0701 - 11/23 0700 11/23 0701 - 11/24 0700   I.V. (mL/kg) 1000 (11)    Total Intake(mL/kg) 1000 (11)    Urine (mL/kg/hr) 300 (0.1)    Blood 30    Total Output 330    Net +670            LABORATORY DATA:  Basename 12/29/11 1451  WBC 10.9*  HGB 14.8  HCT 42.1  PLT 483*    Basename 12/30/11 1230 12/29/11 1451  NA 135 128*  K 4.8 4.4  CL 95* 90*  CO2 29 28  BUN 16 17  CREATININE 1.19 1.19  GLUCOSE 168* 287*    CALCIUM 9.6 9.5   Lab Results  Component Value Date   INR 0.94 12/29/2011   INR 1.0 09/20/2008   INR 1.0 09/09/2008    Recent Radiographic Studies :  Dg Chest 2 View  12/29/2011  *RADIOLOGY REPORT*  Clinical Data: Preoperative evaluation for left hip surgery. Hypertension controlled medically.  Diabetes.  Former smoker.  CHEST - 2 VIEW  Comparison: 09/10/2008  Findings: Changes of underlying COPD with hyperinflation is noted. Taking this into consideration, heart and mediastinal contours are within normal limits.  Mild calcification is seen in the  aortic arch.  The lung fields demonstrate coarseness of the interstitial markings compatible with underlying bronchitic change and this finding is stable.  Bilateral apical pleural thickening is unchanged.  No new areas of focal atelectasis or infiltrate are seen.  Linear scarring at the left lung base is again noted.  Bony structures appear intact.  IMPRESSION: Stable COPD changes with underlying bronchitic change.  No worrisome focal or acute abnormality identified   Original Report Authenticated By: Rhodia Albright, M.D.      Examination:  General appearance: alert and mild distress  Wound Exam: clean, dry, intact   Drainage:  None: wound tissue dry  Motor Exam: EHL and Anterior Tibial Intact  Sensory Exam: difficult to assess change diminished  Vascular Exam: good cap refill to toes  Assessment:    1 Day Post-Op  Procedure(s) (LRB): OPEN REDUCTION INTERNAL FIXATION (ORIF) ANKLE FRACTURE (Left)  ADDITIONAL DIAGNOSIS:  Active Problems:  * No active hospital problems. *   no new dx   Plan: Physical Therapy as ordered Touch Down Weight Bearing (TDWB)  DVT Prophylaxis:  per Dr Lajoyce Corners  DISCHARGE PLAN: Home  DISCHARGE NEEDS: has equipment         [name] W 12/31/2011 9:02 AM

## 2011-12-31 NOTE — Progress Notes (Signed)
  CARE MANAGEMENT NOTE 12/31/2011  Patient:  [name], [name]   Account Number:  [id number]  Date Initiated:  12/31/2011  Documentation initiated by:  Vance Peper  Subjective/Objective Assessment:   66 yr old male s/p ORIF fixation of charcot collapse left foot with excision of medial cuniform.     Action/Plan:   PT to see.   Anticipated DC Date:     Anticipated DC Plan:        DC Planning Services  CM consult      Choice offered to / List presented to:             Status of service:  In process, will continue to follow

## 2011-12-31 NOTE — Evaluation (Signed)
Physical Therapy Evaluation Patient Details Name: [name] MRN: [id number] DOB: [birth year] Today's Date: 12/31/2011 Time: 4098-1191 PT Time Calculation (min): 42 min  PT Assessment / Plan / Recommendation Clinical Impression  Pt s/p L ankle ORIF presenting with L LE NWB. Pt with co-morbidities of MS, severe diabetes and neuropathy, as well as h/o stroke affecting L side. patient strongly desires to return home however is unable to "hop" on R LE to get into house or  use a RW for safe ambulation at this time. [name] home is not w/c accessible. patient reports his wife to be looking for a walker that has a "sling" to hold his L knee so he can weight-bear through the L leg to improve stability with ambulation. Patient may need ST-SNF until patient able to WB on L LE. Will follow up with Dr. Lajoyce Corners on Monday to clarify WBing orders. Patient reports his son can get him into the house however was unable to come up with a safe plan how. Patient educated on the risks of falling and re-opening L foot wound. If patient can weight-bear through fx boot on L LE patient has increased chance of being able to return home safely pending if he can tolerate the pain of L LE WBing.    PT Assessment  Patient needs continued PT services    Follow Up Recommendations  SNF;Supervision/Assistance - 24 hour (or HHPT with 24/7 assist if patient can safely use RW)    Does the patient have the potential to tolerate intense rehabilitation      Barriers to Discharge Decreased caregiver support;Inaccessible home environment 4 steps to enter with 1 handrail, pt unable to safely hop on R LE, wife unable to physically assist patient    Equipment Recommendations  Rolling walker with 5" wheels (drop arm BSC)    Recommendations for Other Services     Frequency Min 5X/week    Precautions / Restrictions Precautions Precautions: Fall Restrictions Weight Bearing Restrictions: Yes LLE Weight Bearing: Non weight bearing     Pertinent Vitals/Pain 9/10 L foot pain      Mobility  Bed Mobility Bed Mobility: Supine to Sit Supine to Sit: 5: Supervision;With rails;HOB elevated Details for Bed Mobility Assistance: pt reports he can't lay flat since his heart attack, he sleep in a recliner Transfers Transfers: Stand Pivot Transfers Stand Pivot Transfers: 1: +2 Total assist;With armrests Stand Pivot Transfers: Patient Percentage: 50% Details for Transfer Assistance: underarm assist, 2 attempts, pt unable to achieve full upright standing, pt able to maintain L LE NWB. pt deferred using RW due to high chance he would loose his balance and he doesn't want L LE to touch ground Ambulation/Gait Ambulation/Gait Assistance: Not tested (comment) Stairs: No Wheelchair Mobility Wheelchair Mobility: No    Shoulder Instructions     Exercises     PT Diagnosis: Difficulty walking  PT Problem List: Decreased strength;Decreased activity tolerance;Decreased balance;Decreased mobility PT Treatment Interventions: DME instruction;Gait training;Stair training;Functional mobility training;Therapeutic activities;Therapeutic exercise   PT Goals Acute Rehab PT Goals PT Goal Formulation: With patient Time For Goal Achievement: 01/14/12 Potential to Achieve Goals: Fair Pt will go Sit to Stand: with min assist;with upper extremity assist (up to RW.) PT Goal: Sit to Stand - Progress: Goal set today Pt will Transfer Bed to Chair/Chair to Bed: with supervision (with or without RW) PT Transfer Goal: Bed to Chair/Chair to Bed - Progress: Goal set today Pt will Ambulate: 16 - 50 feet;with supervision;with rolling walker PT Goal: Ambulate -  Progress: Goal set today Pt will Go Up / Down Stairs: 3-5 stairs;with min assist;with rail(s) PT Goal: Up/Down Stairs - Progress: Goal set today  Visit Information  Last PT Received On: 12/31/11 Assistance Needed: +2    Subjective Data  Subjective: Pt received supine in bed wih c/o pian in L  foot. Patient Stated Goal: home   Prior Functioning  Home Living Lives With: Spouse Available Help at Discharge: Family;Available 24 hours/day (however wife unable to physically assist patient) Type of Home: House Home Access: Stairs to enter [employer] of Steps: 4 Entrance Stairs-Rails: Left Home Layout: One level Bathroom Accessibility: No Home Adaptive Equipment: Walker - four wheeled Additional Comments: pt reports his doorways are to small to use w/c as primary mode of mobility. pt sleeps in a lazy boy recliner.  Pt reports "I've delt with way worse." Prior Function Level of Independence: Independent with assistive device(s) Able to Take Stairs?: Yes Driving: No Vocation: On disability Comments: pt used cane PTA Communication Communication: No difficulties Dominant Hand: Right    Cognition  Overall Cognitive Status: Appears within functional limits for tasks assessed/performed Arousal/Alertness: Awake/alert Orientation Level: Oriented X4 / Intact Behavior During Session: [facility] for tasks performed    Extremity/Trunk Assessment Right Upper Extremity Assessment RUE ROM/Strength/Tone: Within functional levels Left Upper Extremity Assessment LUE ROM/Strength/Tone: Deficits LUE ROM/Strength/Tone Deficits: grossly 3+/5, h/o stroke Right Lower Extremity Assessment RLE ROM/Strength/Tone: Deficits RLE ROM/Strength/Tone Deficits: grossly 3/5, deficits from MS RLE Sensation: History of peripheral neuropathy Left Lower Extremity Assessment LLE ROM/Strength/Tone:  (able to maintain L NWB) LLE Sensation: History of peripheral neuropathy Trunk Assessment Trunk Assessment: Normal   Balance    End of Session PT - End of Session Equipment Utilized During Treatment: Gait belt Activity Tolerance: Patient limited by pain Patient left: in chair;with call bell/phone within reach;with nursing in room Nurse Communication: Mobility status (maxAx2 std pvt back to bed)  GP      Alesa Echevarria Marie 12/31/2011, 10:55 AM  Lewis Shock, PT, DPT Pager #: [phone] Office #: [phone]

## 2012-01-01 LAB — GLUCOSE, CAPILLARY
Glucose-Capillary: 129 mg/dL — ABNORMAL HIGH (ref 70–99)
Glucose-Capillary: 157 mg/dL — ABNORMAL HIGH (ref 70–99)

## 2012-01-01 MED ORDER — ASPIRIN EC 81 MG PO TBEC
81.0000 mg | DELAYED_RELEASE_TABLET | Freq: Every day | ORAL | Status: DC
  Administered 2012-01-01 – 2012-01-10 (×10): 81 mg via ORAL
  Filled 2012-01-01 (×11): qty 1

## 2012-01-01 NOTE — Progress Notes (Signed)
Patient ID: [name], male   DOB: 1945-10-17, 66 y.o.   MRN: [id number] PATIENT ID: [name]        MRN:  [id number]          DOB/AGE: 1945-02-27 / 66 y.o.    [name], MD   Jacqualine Code, PA-C [address] [place], [place]  [address]                             [phone]   PROGRESS NOTE  Subjective:  negative for Chest Pain  negative for Shortness of Breath  negative for Nausea/Vomiting   negative for Calf Pain    Tolerating Diet: yes         Patient reports pain as moderate.     Wants to go home  Objective: Vital signs in last 24 hours:   Patient Vitals for the past 24 hrs:  BP Temp Temp src Pulse Resp SpO2  01/01/12 0535 131/46 mmHg 97.6 F (36.4 C) Oral 61  18  94 %  12/31/11 2120 154/60 mmHg 98.6 F (37 C) Oral 70  19  97 %  12/31/11 1601 157/53 mmHg 99.1 F (37.3 C) Oral 72  18  96 %      Intake/Output from previous day:   11/23 0701 - 11/24 0700 In: 480 [P.O.:480] Out: 1500 [Urine:1500]   Intake/Output this shift:       Intake/Output      11/23 0701 - 11/24 0700 11/24 0701 - 11/25 0700   P.O. 480    I.V. (mL/kg)     Total Intake(mL/kg) 480 (5.3)    Urine (mL/kg/hr) 1500 (0.7)    Blood     Total Output 1500    Net -1020            LABORATORY DATA:  Basename 12/29/11 1451  WBC 10.9*  HGB 14.8  HCT 42.1  PLT 483*    Basename 12/30/11 1230 12/29/11 1451  NA 135 128*  K 4.8 4.4  CL 95* 90*  CO2 29 28  BUN 16 17  CREATININE 1.19 1.19  GLUCOSE 168* 287*  CALCIUM 9.6 9.5   Lab Results  Component Value Date   INR 0.94 12/29/2011   INR 1.0 09/20/2008   INR 1.0 09/09/2008    Recent Radiographic Studies :  Dg Chest 2 View  12/29/2011  *RADIOLOGY REPORT*  Clinical Data: Preoperative evaluation for left hip surgery. Hypertension controlled medically.  Diabetes.  Former smoker.  CHEST - 2 VIEW  Comparison: 09/10/2008  Findings: Changes of underlying COPD with hyperinflation is noted. Taking this into consideration, heart and  mediastinal contours are within normal limits.  Mild calcification is seen in the aortic arch.  The lung fields demonstrate coarseness of the interstitial markings compatible with underlying bronchitic change and this finding is stable.  Bilateral apical pleural thickening is unchanged.  No new areas of focal atelectasis or infiltrate are seen.  Linear scarring at the left lung base is again noted.  Bony structures appear intact.  IMPRESSION: Stable COPD changes with underlying bronchitic change.  No worrisome focal or acute abnormality identified   Original Report Authenticated By: Rhodia Albright, M.D.      Examination:  General appearance: alert, cooperative and mild distress  Wound Exam: clean, dry, intact   Dressing changed today  Drainage:  None: wound tissue dry  Vascular Exam: cap refill <2 sec   Assessment:  2 Days Post-Op  Excision bone I&D, Antibiotic beads ADDITIONAL DIAGNOSIS:  Active Problems:  * No active hospital problems. *   Diabetes   Plan: Physical Therapy as ordered Non Weight Bearing (NWB)  DVT Prophylaxis:  Aspirin and plavix  DISCHARGE PLAN: Home once cleared by PT  DISCHARGE NEEDS: Paul Dykes 01/01/2012 9:35 AM

## 2012-01-01 NOTE — Progress Notes (Signed)
Physical Therapy Treatment Patient Details Name: [name] MRN: [id number] DOB: Jun 23, 1945 Today's Date: 01/01/2012 Time: 5784-6962 PT Time Calculation (min): 35 min  PT Assessment / Plan / Recommendation Comments on Treatment Session  Admitted with L Ankle fx, now s/p ORIF; Still belive pt would overall benefit from SNF stay for rehab to maximize independence and safety with mobility prior to dc home; Making improvements with functional mobility, though still with decr safety with stepping; Pt reports his son is looking into ramp options for entering home, which makes dc home at mostly wheelchair level (with HHPT to work on transfers and gait) a more viable option; It is also worth considering ambulance transport home if ramp not in place by dc, and pt is to dc home    Follow Up Recommendations  SNF;Supervision/Assistance - 24 hour;Home health PT     Does the patient have the potential to tolerate intense rehabilitation     Barriers to Discharge        Equipment Recommendations  Rolling walker with 5" wheels;Wheelchair (measurements);Wheelchair cushion (measurements) (left Secondary school teacher)    Recommendations for Other Services OT consult  Frequency Min 5X/week   Plan Discharge plan remains appropriate;Discharge plan needs to be updated    Precautions / Restrictions Precautions Precautions: Fall Restrictions LLE Weight Bearing: Non weight bearing   Pertinent Vitals/Pain Grimace with movement; did not rate pain; elevated LLE at end of session    Mobility  Bed Mobility Bed Mobility: Supine to Sit;Sit to Supine;Sitting - Scoot to Edge of Bed Supine to Sit: 5: Supervision;With rails;HOB elevated Sitting - Scoot to Edge of Bed: 5: Supervision Sit to Supine: 5: Supervision Details for Bed Mobility Assistance: pt reports he can't lay flat since his heart attack, he sleep in a recliner Transfers Transfers: Sit to Stand;Stand to Sit Sit to Stand: 3: Mod assist;With upper  extremity assist;From bed Stand to Sit: 4: Min assist;With upper extremity assist;To bed Details for Transfer Assistance: Pt quite dependent on momentum and UEs pulling on RW for successful sit to stand; (he reports that is how he always stands, and is not likely to change, despite safety cues); Required mod physical assist for control and to steady RW; Cues for safety and to control descent back to bed Ambulation/Gait Ambulation/Gait Assistance: 4: Min assist Ambulation Distance (Feet): 2 Feet (Sidesteps toward [facility]) Assistive device: Rolling walker Ambulation/Gait Assistance Details: Required min assist for managing RW; Pt with uncontrolled "hop-steps" on RLE, but able to take those few steps keeping NWB LLE Stairs:  Discussed possibility of ramp or ambulance transport home    Exercises     PT Diagnosis:    PT Problem List:   PT Treatment Interventions:     PT Goals Acute Rehab PT Goals PT Goal Formulation: With patient Time For Goal Achievement: 01/14/12 Potential to Achieve Goals: Fair Pt will go Sit to Stand: with min assist;with upper extremity assist PT Goal: Sit to Stand - Progress: Progressing toward goal Pt will Transfer Bed to Chair/Chair to Bed: with supervision PT Transfer Goal: Bed to Chair/Chair to Bed - Progress: Progressing toward goal Pt will Ambulate: 16 - 50 feet;with supervision;with rolling walker PT Goal: Ambulate - Progress: Progressing toward goal Pt will Propel Wheelchair: > 150 feet;with modified independence PT Goal: Propel Wheelchair - Progress: Goal set today  Visit Information  Last PT Received On: 01/01/12 Assistance Needed: +2 (for stairs, incr amb; +1 bedside)    Subjective Data  Subjective: Pt stating he really wants to  go home tomorrow Patient Stated Goal: home   Cognition  Overall Cognitive Status: Appears within functional limits for tasks assessed/performed Arousal/Alertness: Awake/alert Orientation Level: Oriented X4 / Intact Behavior  During Session: [facility] for tasks performed    Balance     End of Session PT - End of Session Equipment Utilized During Treatment: Gait belt Activity Tolerance: Patient tolerated treatment well Patient left: in bed;with call bell/phone within reach Nurse Communication: Mobility status   GP     Van Clines [facility] [place], [place] [phone]  01/01/2012, 6:16 PM

## 2012-01-02 ENCOUNTER — Encounter (HOSPITAL_COMMUNITY): Payer: Self-pay | Admitting: Orthopedic Surgery

## 2012-01-02 LAB — GLUCOSE, CAPILLARY
Glucose-Capillary: 114 mg/dL — ABNORMAL HIGH (ref 70–99)
Glucose-Capillary: 134 mg/dL — ABNORMAL HIGH (ref 70–99)
Glucose-Capillary: 170 mg/dL — ABNORMAL HIGH (ref 70–99)
Glucose-Capillary: 225 mg/dL — ABNORMAL HIGH (ref 70–99)

## 2012-01-02 NOTE — Progress Notes (Signed)
UR COMPLETED  

## 2012-01-02 NOTE — Progress Notes (Signed)
Patient ID: [name], male   DOB: 1945-10-01, 66 y.o.   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 [name] dressing is clean and dry. Patient is having difficulty progress with his therapy. He will try a kneeling walker he is concerned that he has 4 steps to get up in his house and does not feel he can safely get up 4 steps at home we will see how he does with therapy today.

## 2012-01-02 NOTE — Progress Notes (Signed)
Physical Therapy Treatment Patient Details Name: [name] MRN: [id number] DOB: July 23, 1945 Today's Date: 01/02/2012 Time: 4098-1191 PT Time Calculation (min): 25 min  PT Assessment / Plan / Recommendation Comments on Treatment Session  Trialed standing to knee walker however pt unable to achieve standing despite +2 (A).  Spoke with Dr. Lajoyce Corners via telephone regarding pt's WBing status; he states pt can be TDWB LLE with hardsole shoe.   Pt declined further activity once WBing status updated today.      Follow Up Recommendations  SNF;Supervision/Assistance - 24 hour;Home health PT     Does the patient have the potential to tolerate intense rehabilitation     Barriers to Discharge        Equipment Recommendations  Other (comment);Rolling walker with 5" wheels;3 in 1 bedside comode;Wheelchair (measurements);Wheelchair cushion (measurements) (Chartered certified accountant)    Recommendations for Other Services OT consult  Frequency Min 5X/week   Plan Discharge plan remains appropriate;Discharge plan needs to be updated    Precautions / Restrictions Precautions Precautions: Fall Restrictions Weight Bearing Restrictions: Yes Other Position/Activity Restrictions: Spoke with  Dr. Lajoyce Corners via telephone, he reports pt can TDWB   Pertinent Vitals/Pain Pt states "I never have less than 5/10"      Mobility  Bed Mobility Bed Mobility: Supine to Sit;Sitting - Scoot to Edge of Bed Supine to Sit: 6: Modified independent (Device/Increase time);HOB elevated Sitting - Scoot to Edge of Bed: 6: Modified independent (Device/Increase time) Transfers Transfers: Sit to Stand;Stand to Sit Sit to Stand: 1: +2 Total assist;3: Mod assist;With upper extremity assist;From bed Sit to Stand: Patient Percentage: 10% Stand to Sit: 4: Min assist Details for Transfer Assistance: Attempted to stand from bed to "knee walker" but pt unable to achieve standing despite using UE's on handles of walker to pull himself forwards  (pt slid hips/buttocks directly from bed>knee seat of walker).  However, when standing with RW pt able to achieve full upright position.  (A) to stabilize knee walker & RW, (A) to achieve standing, balance, & controlled descent.  Pt uses rocking motion to gain momentum & starts with hands on AD to stand.   Ambulation/Gait Ambulation/Gait Assistance: 4: Min assist Ambulation Distance (Feet):  (2 small hops forwards) Assistive device: Rolling walker Ambulation/Gait Assistance Details: (A) for balance, RW stabilization, & safety.  Cues to ensure balance before taking "hop".  Pt cont's with uncontrolled "hop-steps".        PT Goals Acute Rehab PT Goals PT Goal Formulation: With patient Time For Goal Achievement: 01/14/12 Potential to Achieve Goals: Fair Pt will go Sit to Stand: with min assist;with upper extremity assist PT Goal: Sit to Stand - Progress: Not met Pt will Transfer Bed to Chair/Chair to Bed: with supervision Pt will Ambulate: 16 - 50 feet;with supervision;with rolling walker PT Goal: Ambulate - Progress: Not met  Visit Information  Last PT Received On: 01/02/12 Assistance Needed: +2 (+1 bedside)    Subjective Data      Cognition  Overall Cognitive Status: Appears within functional limits for tasks assessed/performed Arousal/Alertness: Awake/alert Orientation Level: Oriented X4 / Intact Behavior During Session: [facility] for tasks performed    Balance     End of Session PT - End of Session Equipment Utilized During Treatment: Gait belt Activity Tolerance: Patient tolerated treatment well Patient left: in bed;with call bell/phone within reach Nurse Communication: Mobility status     Verdell Face, [place] [phone] 01/02/2012

## 2012-01-03 LAB — GLUCOSE, CAPILLARY
Glucose-Capillary: 233 mg/dL — ABNORMAL HIGH (ref 70–99)
Glucose-Capillary: 219 mg/dL — ABNORMAL HIGH (ref 70–99)

## 2012-01-03 NOTE — Progress Notes (Signed)
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     Action/Plan:   PT to see. Patient wife to pickup knee walker from [employer] on [address].   Anticipated DC Date:  01/04/2012   Anticipated DC Plan:  HOME W HOME HEALTH SERVICES  In-house referral  Clinical Social Worker      DC Planning Services  CM consult      [facility] Choice  HOME HEALTH  DURABLE MEDICAL EQUIPMENT   Choice offered to / List presented to:  C-3 Spouse   DME arranged  Levan Hurst      DME agency  [facility]     HH arranged  HH-2 PT      [place] [facility] agency  [facility]   Status of service:  Completed, signed off Medicare Important Message given?   (If response is "NO", the following Medicare IM given date fields will be blank) Date Medicare IM given:   Date Additional Medicare IM given:    Discharge Disposition:  SKILLED NURSING FACILITY  Per UR Regulation:    If discussed at Long Length of Stay Meetings, dates discussed:    Comments:  01/03/12 1600 Vance Peper, RN BSN Case Manager Patient progressing poorly with therapy, will not have consistent care at home, will need shortterm palcement per physical therapist.

## 2012-01-03 NOTE — Progress Notes (Signed)
Patient ID: [name], male   DOB: 1945-10-22, 66 y.o.   MRN: [id number] We will try again with patient with physical therapy today with touchdown weightbearing for transfer training to a kneeling walker. Patient does not want to  consider skilled nursing care due to the fact that he states that they will push him too hard. He feels like he can be discharged to home. He wishes to bring his fracture boot in from home to wear on the left lower extremity for his touchdown weightbearing transfer training. Will see how he does today with therapy. May still require skilled nursing care.

## 2012-01-03 NOTE — Progress Notes (Signed)
Physical Therapy Treatment Patient Details Name: [name] MRN: [id number] DOB: 1945/05/10 Today's Date: 01/03/2012 Time: 1430-1500 PT Time Calculation (min): 30 min  PT Assessment / Plan / Recommendation Comments on Treatment Session  pt presents with L ORIF of Charcot foot.  pt not receptive to cues for safety and WBing restirctions.  pt argueing with staff and family about what he feels like he can do, however when asked to show PT what he can do, is unable to mobilize without 1-2 person A.  Lengthy discussion with pt and family about SNF stay for rehab.  Need to continue this conversation and try slide transfer into drop arm tomorrow.      Follow Up Recommendations  SNF     Does the patient have the potential to tolerate intense rehabilitation     Barriers to Discharge        Equipment Recommendations  Other (comment);Rolling walker with 5" wheels;3 in 1 bedside comode;Wheelchair (measurements);Wheelchair cushion (measurements)    Recommendations for Other Services OT consult  Frequency Min 5X/week   Plan Frequency remains appropriate;Discharge plan remains appropriate    Precautions / Restrictions Precautions Precautions: Fall Restrictions Weight Bearing Restrictions: Yes LLE Weight Bearing: Touchdown weight bearing Other Position/Activity Restrictions: Order for NWBing on 11/22, however per PTA conversation with Dr Lajoyce Corners and MD's note pt is TDWBing,  pt thinks he can put 25% WBing, however this is not noted anywhere.     Pertinent Vitals/Pain Indicates Bil neuropathies decrease pain.      Mobility  Bed Mobility Bed Mobility: Sit to Supine Sit to Supine: 6: Modified independent (Device/Increase time) Transfers Transfers: Sit to Stand;Stand to Sit;Stand Pivot Transfers Sit to Stand: 1: +2 Total assist;With upper extremity assist;From chair/3-in-1;With armrests Sit to Stand: Patient Percentage: 40% Stand to Sit: 4: Min assist Stand Pivot Transfers: 3: Mod  assist Details for Transfer Assistance: Attempted to stand to use knee scooter as pt wanting, however even with 2 person A pt WBing through L LE onto PT's foot.  pt very upset and argueing with PT saying he is not WBing, however very clearly standing on L LE.  Lengthy discussion with pt about importance of following MD's WBing restrictions.  Discussed use of slide transfer, however pt not receptive to this, though when it was brought up again at end of session pt seemed more receptive to trying slide transfer with drop arm recliner next time.   Ambulation/Gait Ambulation/Gait Assistance: Not tested (comment) Stairs: No Wheelchair Mobility Wheelchair Mobility: No    Exercises     PT Diagnosis:    PT Problem List:   PT Treatment Interventions:     PT Goals Acute Rehab PT Goals Time For Goal Achievement: 01/14/12 PT Goal: Sit to Stand - Progress: Progressing toward goal PT Transfer Goal: Bed to Chair/Chair to Bed - Progress: Progressing toward goal  Visit Information  Last PT Received On: 01/03/12 Assistance Needed: +2    Subjective Data  Subjective: pt very frustrated and making comments about lack of support from wife and son.     Cognition  Overall Cognitive Status: Appears within functional limits for tasks assessed/performed Arousal/Alertness: Awake/alert Orientation Level: Oriented X4 / Intact Behavior During Session: WFL for tasks performed    Balance     End of Session PT - End of Session Equipment Utilized During Treatment: Gait belt Activity Tolerance: Patient tolerated treatment well Patient left: in bed;with call bell/phone within reach Nurse Communication: Mobility status   GP  Sunny Schlein, [place] [phone] 01/03/2012, 3:41 PM

## 2012-01-04 ENCOUNTER — Encounter (HOSPITAL_COMMUNITY): Payer: Self-pay | Admitting: General Practice

## 2012-01-04 LAB — GLUCOSE, CAPILLARY
Glucose-Capillary: 310 mg/dL — ABNORMAL HIGH (ref 70–99)
Glucose-Capillary: 252 mg/dL — ABNORMAL HIGH (ref 70–99)
Glucose-Capillary: 99 mg/dL (ref 70–99)
Glucose-Capillary: 144 mg/dL — ABNORMAL HIGH (ref 70–99)

## 2012-01-04 MED ORDER — HYDROCODONE-ACETAMINOPHEN 10-325 MG PO TABS: 1.0000 | ORAL_TABLET | ORAL | Status: DC | PRN

## 2012-01-04 MED ORDER — OXYCODONE HCL ER 80 MG PO T12A: 80.0000 mg | EXTENDED_RELEASE_TABLET | Freq: Two times a day (BID) | ORAL | Status: DC

## 2012-01-04 NOTE — Progress Notes (Signed)
Patient evaluated for long-term disease management services with [facility] Care Management Program as a benefit of his [employer]. Explained [facility] Care Management services. Reports he plans to go to SNF at discharge. States he prefers [facility]. Explained that eventually when he is discharged home, he will receive post transition of care call and will be evaluated for monthly home visits for assessments and for education. Left contact information and [facility] Literature at bedside.      Raiford Noble, RN,BSN, MSN  [facility] Liaison [phone]

## 2012-01-04 NOTE — Progress Notes (Signed)
Physical Therapy Treatment Patient Details Name: [name] MRN: [id number] DOB: 1945-07-29 Today's Date: 01/04/2012 Time: 0911-0930 PT Time Calculation (min): 19 min  PT Assessment / Plan / Recommendation Comments on Treatment Session  Due to pt having difficulty with sit<>stand transfers up to this date, transfer training consisted of lateral scooting to allow pt to be more (I).   Pt performed bed<>recliner (drop arm recliner) 3x's today & did really well, however cont. to recommend ST-SNF prior to d/c home to maximize (I) with functional mobilty & safety.      Follow Up Recommendations  SNF     Does the patient have the potential to tolerate intense rehabilitation     Barriers to Discharge        Equipment Recommendations  Other (comment);Rolling walker with 5" wheels;3 in 1 bedside comode;Wheelchair (measurements);Wheelchair cushion (measurements)    Recommendations for Other Services    Frequency Min 5X/week   Plan Frequency remains appropriate;Discharge plan remains appropriate    Precautions / Restrictions Precautions Precautions: Fall Restrictions Weight Bearing Restrictions: Yes LLE Weight Bearing: Touchdown weight bearing   Pertinent Vitals/Pain 6/10  Lt ankle/foot.  Premedicated.  "Im never below a 5 on the pain scale"    Mobility  Bed Mobility Bed Mobility: Supine to Sit;Sitting - Scoot to Edge of Bed Supine to Sit: 6: Modified independent (Device/Increase time);With rails Sitting - Scoot to Edge of Bed: 6: Modified independent (Device/Increase time) Transfers Transfers: Lateral/Scoot Transfers Lateral/Scoot Transfers: 4: Min guard;With armrests removed Details for Transfer Assistance: Pt performed lateral scooting bed<>recliner 3x's today.  Armrest of recliner removed.  Pt scooted to both strong side & affected side.  Pt performed with NWBing LLE & wore tennis shoe on RLE for increased grip.   Ambulation/Gait Ambulation/Gait Assistance: Not tested  (comment) Stairs: No Wheelchair Mobility Wheelchair Mobility: No      PT Goals Acute Rehab PT Goals Time For Goal Achievement: 01/14/12 Potential to Achieve Goals: Fair Pt will go Sit to Stand: with min assist;with upper extremity assist Pt will Transfer Bed to Chair/Chair to Bed: with supervision PT Transfer Goal: Bed to Chair/Chair to Bed - Progress: Progressing toward goal Pt will Ambulate: 16 - 50 feet;with supervision;with rolling walker Pt will Go Up / Down Stairs: 3-5 stairs;with min assist;with rail(s) Pt will Propel Wheelchair: > 150 feet;with modified independence  Visit Information  Last PT Received On: 01/04/12 Assistance Needed: +1    Subjective Data      Cognition  Overall Cognitive Status: Appears within functional limits for tasks assessed/performed Arousal/Alertness: Awake/alert Orientation Level: Oriented X4 / Intact Behavior During Session: [facility] for tasks performed    Balance     End of Session PT - End of Session Activity Tolerance: Patient tolerated treatment well Patient left: in chair;with call bell/phone within reach Nurse Communication: Mobility status;Other (comment) (lateral scooting transfer)     Verdell Face, PTA [phone] 01/04/2012

## 2012-01-04 NOTE — Progress Notes (Signed)
Patient ID: [name], male   DOB: November 17, 1945, 66 y.o.   MRN: [id number] Patient was unable to participate with therapy yesterday. [name] will not be able to be discharged to home independently and will require short-term skilled nursing. We will discharge to short-term skilled nursing when a bed is available.

## 2012-01-04 NOTE — Discharge Summary (Signed)
Physician Discharge Summary  Patient ID: [name] MRN: [id number] DOB/AGE: 1946-01-03 66 y.o.  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  Discharge Diagnoses: Same Active Problems:  * No active hospital problems. 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 Patient has had progressive ulceration and infection due to Charcot collapse and presents at this time for stabilization. Patient underwent excision of the medial cuneiform and partial excision of the surrounding bones. He underwent open reduction internal fixation with placement of internal fixation to stabilize the medial column. Postoperatively patient was unable to be independent with his ambulation and will require short-term skilled nursing. Anticipate patient will be nonweightbearing left foot for over 3 months.  Consults: None  Significant Diagnostic Studies: labs:  Routine labs  Treatments: surgery: See operative note  Discharge Exam: Blood pressure 155/61, pulse 61, temperature 98 F (36.7 C), temperature source Oral, resp. rate 18, height 6' (1.829 m), weight 91.173 kg (201 lb), SpO2 92.00%. Incision/Wound: incision clean dry and intact at time of discharge  Disposition:      Medication List     As of 01/04/2012  7:32 AM    ASK your doctor about these medications         amLODipine 10 MG tablet   Commonly known as: NORVASC   Take 10 mg by mouth daily.      aspirin 81 MG tablet   Take 81 mg by mouth daily.      atorvastatin 10 MG tablet   Commonly known as: LIPITOR   Take 10 mg by mouth daily.      clopidogrel 75 MG tablet   Commonly known as: PLAVIX   Take 75 mg by mouth daily.      doxycycline 100 MG capsule   Commonly known as: VIBRAMYCIN   Take 100 mg by mouth 2 (two) times daily. Patient started  medication on 12/15/11      finasteride 5 MG tablet   Commonly known as: PROSCAR   Take 5 mg by mouth 2 (two) times daily.      HYDROcodone-acetaminophen 10-325 MG per tablet   Commonly known as: NORCO   Take 1 tablet by mouth every 6 (six) hours as needed. For pain      insulin aspart 100 UNIT/ML injection   Commonly known as: novoLOG   Inject 5-10 Units into the skin daily as needed. Per sliding scale 200=5units, 400=10 units. Takes only when he needs it per patient.      insulin NPH 100 UNIT/ML injection   Commonly known as: HUMULIN N,NOVOLIN N   Inject 17-42 Units into the skin 2 (two) times daily. 42units in AM, and 17 in the evening      metoCLOPramide 5 MG tablet   Commonly known as: REGLAN   Take 5 mg by mouth daily as needed. For nausea      metoprolol tartrate 25 MG tablet   Commonly known as: LOPRESSOR   Take 25 mg by mouth 2 (two) times daily.      multivitamin with minerals Tabs   Take 1 tablet by mouth daily.      nitroGLYCERIN 0.4 MG SL tablet   Commonly known as: NITROSTAT   Place 0.4 mg under the tongue every 5 (five) minutes as needed. For chest pain      oxyCODONE 80 MG 12 hr tablet  Commonly known as: OXYCONTIN   Take 80 mg by mouth every 12 (twelve) hours. Take five times a day per patient      traZODone 150 MG tablet   Commonly known as: DESYREL   Take 150 mg by mouth at bedtime.           Follow-up Information    Follow up with DUDA,MARCUS V, MD. In 2 weeks.   Contact information:   [name] [place] [address] [phone]          Signed: Nadara Mustard 01/04/2012, 7:32 AM

## 2012-01-05 LAB — GLUCOSE, CAPILLARY
Glucose-Capillary: 98 mg/dL (ref 70–99)
Glucose-Capillary: 160 mg/dL — ABNORMAL HIGH (ref 70–99)

## 2012-01-05 NOTE — Progress Notes (Signed)
Subjective: 6 Days Post-Op Procedure(s) (LRB): OPEN REDUCTION INTERNAL FIXATION (ORIF) ANKLE FRACTURE (Left) Left charcot foot post I and D.Patient reports pain as mild.   Diabetes with peripheral neuropathy. Father is an inpatient at [facility] He desire to be placed there if possible.  Objective:   VITALS:  Temp:  [98.1 F (36.7 C)-98.4 F (36.9 C)] 98.4 F (36.9 C) (11/28 0600) Pulse Rate:  [56-66] 56  (11/28 0600) Resp:  [18] 18  (11/28 0600) BP: (136-168)/(47-56) 154/51 mmHg (11/28 0600) SpO2:  [93 %-96 %] 95 % (11/28 0600)  ABD soft Intact pulses distally Dorsiflexion/Plantar flexion intact No cellulitis present Compartment soft Dressing changed left foot, dry bloody drainage, wound is dry, no erythrema.   LABS No results found for this basename: HGB:5,WBC:2,PLT:2 in the last 72 hours No results found for this basename: NA:2,K:2,CL:2,CO2:2,BUN:2,CREATININE:2,GLUCOSE:2, in the last 72 hours No results found for this basename: LABPT:2,INR:2 in the last 72 hours   Assessment/Plan: 6 Days Post-Op Procedure(s) (LRB): OPEN REDUCTION INTERNAL FIXATION (ORIF) ANKLE FRACTURE (Left)  Advance diet Up with therapy Continue ABX therapy due to Post-op infection SNF placement.  Yemariam Magar E 01/05/2012, 9:26 AM

## 2012-01-06 LAB — GLUCOSE, CAPILLARY
Glucose-Capillary: 118 mg/dL — ABNORMAL HIGH (ref 70–99)
Glucose-Capillary: 370 mg/dL — ABNORMAL HIGH (ref 70–99)
Glucose-Capillary: 86 mg/dL (ref 70–99)

## 2012-01-06 NOTE — Progress Notes (Signed)
Physical Therapy Treatment Patient Details Name: Ace Bergfeld Cuda MRN: [id number] DOB: [birth year] Today's Date: 01/06/2012 Time: 0950-1005 PT Time Calculation (min): 15 min  PT Assessment / Plan / Recommendation Comments on Treatment Session  Pt at Supervision level for lateral scooting into drop-arm chair.  Cont to recommend STR due to pt states he will not have (A) at home & home is not w/c accessible inside (per pt)    Follow Up Recommendations  SNF     Does the patient have the potential to tolerate intense rehabilitation     Barriers to Discharge        Equipment Recommendations  Rolling walker with 5" wheels;Wheelchair (measurements);Wheelchair cushion (measurements) (drop arm BSC; Lt elevating legrest)    Recommendations for Other Services OT consult  Frequency Min 3X/week   Plan Frequency needs to be updated    Precautions / Restrictions Precautions Precautions: Fall Restrictions Weight Bearing Restrictions: Yes LLE Weight Bearing: Touchdown weight bearing   Pertinent Vitals/Pain 5/10 .  Pt on scheduled pain medication.  Not due at this time.      Mobility  Bed Mobility Bed Mobility: Supine to Sit;Sitting - Scoot to Edge of Bed Supine to Sit: 6: Modified independent (Device/Increase time) Sitting - Scoot to Edge of Bed: 6: Modified independent (Device/Increase time) Transfers Transfers: Lateral/Scoot Transfers Lateral/Scoot Transfers: 5: Supervision Details for Transfer Assistance: Supervsion for safety due to pt moving very quickly.  Cues to slow down.   Ambulation/Gait Ambulation/Gait Assistance: Not tested (comment) Stairs: No Wheelchair Mobility Wheelchair Mobility: No    Exercises General Exercises - Lower Extremity Long Arc Quad: AROM;Both;15 reps Heel Slides: AROM;Both;15 reps Hip ABduction/ADduction: AROM;Both;15 reps Straight Leg Raises: AROM;Both;15 reps Hip Flexion/Marching: AROM;Both;15 reps     PT Goals Acute Rehab PT Goals Time For Goal  Achievement: 01/14/12 Potential to Achieve Goals: Fair Pt will go Sit to Stand: with min assist;with upper extremity assist Pt will Transfer Bed to Chair/Chair to Bed: with supervision PT Transfer Goal: Bed to Chair/Chair to Bed - Progress: Met Pt will Ambulate: 16 - 50 feet;with supervision;with rolling walker Pt will Go Up / Down Stairs: 3-5 stairs;with min assist;with rail(s) Pt will Propel Wheelchair: > 150 feet;with modified independence  Visit Information  Last PT Received On: 01/06/12 Assistance Needed: +1    Subjective Data      Cognition  Overall Cognitive Status: Appears within functional limits for tasks assessed/performed Arousal/Alertness: Awake/alert Orientation Level: Oriented X4 / Intact Behavior During Session: [facility] for tasks performed    Balance     End of Session PT - End of Session Equipment Utilized During Treatment: Gait belt Activity Tolerance: Patient tolerated treatment well Patient left: in chair;with call bell/phone within reach (MD present) Nurse Communication: Mobility status     Verdell Face, [place] [phone] 01/06/2012

## 2012-01-06 NOTE — Clinical Social Work Psychosocial (Signed)
     Clinical Social Work Department BRIEF PSYCHOSOCIAL ASSESSMENT 01/06/2012  Patient:  [name], [name]     Account Number:  [id number]     Admit date:  12/30/2011  Clinical Social Worker:  Lourdes Sledge  Date/Time:  01/06/2012 03:56 PM  Referred by:  Physician  Date Referred:  01/06/2012 Referred for  SNF Placement   Other Referral:   Interview type:  Patient Other interview type:   CSW completed assessment with pt spouse as well [name] as well.    PSYCHOSOCIAL DATA Living Status:  WIFE Admitted from facility:   Level of care:   Primary support name:  [name] Primary support relationship to patient:  SPOUSE Degree of support available:   Pt spouse appears to be actively involved in pt care.    CURRENT CONCERNS Current Concerns  Post-Acute Placement   Other Concerns:    SOCIAL WORK ASSESSMENT / PLAN Covering CSW informed PT recommending SNF.    CSW visited pt room and spoke to pt and pt spouse [name] regarding SNf placement. Pt spouse states his wife works and pt will need 24hr care at discharge and is therefore agreeable to SNF placment. Pt spouse confirms this information and both pt and spouse agree that they would like placment for pt at either [facility] or [facility]. Pt spouse confirms his parents both live at [facility] and would like pt to go there.    CSW received consent to fax pt out. CSW confirmed with Tresa Endo at [facility] that placement has been offered for pt. Pt has concerns regarding cost, Tresa Endo will run pt insurance on Monday.   Assessment/plan status:  Psychosocial Support/Ongoing Assessment of Needs Other assessment/ plan:   Information/referral to community resources:   CSW provided pt with a SNF list and CSW contact information.    PATIENTS/FAMILYS RESPONSE TO PLAN OF CARE: Pt laying in bed alert and oriented. Pt prefers to return home however needs 24hr assistance. Pt is agreeable to rehab however would like to confirm how  much it will cost him. Facility to confirm information on Monday.

## 2012-01-06 NOTE — Progress Notes (Signed)
Subjective: 7 Days Post-Op Procedure(s) (LRB): OPEN REDUCTION INTERNAL FIXATION (ORIF) ANKLE FRACTURE (Left) This patient's awake alert oriented x4 he is awaiting skilled nursing facility placement and is hoping for [facility] skilled nursing facility. Patient reports pain as and no sensation associated with left foot due to diabetic peripheral neuropathy..    Objective:   VITALS:  Temp:  [97 F (36.1 C)-98.6 F (37 C)] 97 F (36.1 C) (11/29 0738) Pulse Rate:  [59-71] 59  (11/29 0738) Resp:  [16-18] 16  (11/29 0738) BP: (142-162)/(51-62) 162/62 mmHg (11/29 0738) SpO2:  [97 %-98 %] 98 % (11/29 0738)  ABD soft Dorsiflexion/Plantar flexion intact Incision: dressing C/D/I   LABS No results found for this basename: HGB:5,WBC:2,PLT:2 in the last 72 hours No results found for this basename: NA:2,K:2,CL:2,CO2:2,BUN:2,CREATININE:2,GLUCOSE:2, in the last 72 hours No results found for this basename: LABPT:2,INR:2 in the last 72 hours   Assessment/Plan: 7 Days Post-Op Procedure(s) (LRB): OPEN REDUCTION INTERNAL FIXATION (ORIF) ANKLE FRACTURE (Left)  Advance diet Up with therapy Discharge to SNF eventually to skilled nursing facility when bed available.  NITKA,JAMES E 01/06/2012, 10:30 AM

## 2012-01-06 NOTE — Clinical Social Work Placement (Signed)
     Clinical Social Work Department CLINICAL SOCIAL WORK PLACEMENT NOTE 01/06/2012  Patient:  [name], [name]  Account Number:  [id number] Admit date:  12/30/2011  Clinical Social Worker:  Theresia Bough, Theresia Majors  Date/time:  01/06/2012 04:02 PM  Clinical Social Work is seeking post-discharge placement for this patient at the following level of care:   SKILLED NURSING   (*CSW will update this form in Epic as items are completed)   01/06/2012  Patient/family provided with [facility] Department of Clinical Social Works list of facilities offering this level of care within the geographic area requested by the patient (or if unable, by the patients family).  01/06/2012  Patient/family informed of their freedom to choose among providers that offer the needed level of care, that participate in Medicare, Medicaid or managed care program needed by the patient, have an available bed and are willing to accept the patient.  01/06/2012  Patient/family informed of [facility] ownership interest in [facility], as well as of the fact that they are under no obligation to receive care at this facility.  PASARR submitted to EDS on 01/04/2012 PASARR number received from EDS on   FL2 transmitted to all facilities in geographic area requested by pt/family on  01/06/2012 FL2 transmitted to all facilities within larger geographic area on   Patient informed that his/her managed care company has contracts with or will negotiate with  certain facilities, including the following:     Patient/family informed of bed offers received:   Patient chooses bed at  Physician recommends and patient chooses bed at    Patient to be transferred to  on   Patient to be transferred to facility by   The following physician request were entered in Epic:   Additional Comments:

## 2012-01-06 NOTE — Progress Notes (Signed)
Utilization review completed. Ahsha Hinsley, RN, BSN. 

## 2012-01-07 LAB — CBC WITH DIFFERENTIAL/PLATELET
Basophils Relative: 0 % (ref 0–1)
Eosinophils Absolute: 0.2 10*3/uL (ref 0.0–0.7)
Eosinophils Relative: 2 % (ref 0–5)
HCT: 36.7 % — ABNORMAL LOW (ref 39.0–52.0)
Hemoglobin: 12.4 g/dL — ABNORMAL LOW (ref 13.0–17.0)
Lymphs Abs: 1.5 10*3/uL (ref 0.7–4.0)
MCH: 33.3 pg (ref 26.0–34.0)
MCHC: 33.8 g/dL (ref 30.0–36.0)
MCV: 98.7 fL (ref 78.0–100.0)
Monocytes Absolute: 0.9 10*3/uL (ref 0.1–1.0)
Monocytes Relative: 9 % (ref 3–12)
Neutrophils Relative %: 73 % (ref 43–77)
RBC: 3.72 MIL/uL — ABNORMAL LOW (ref 4.22–5.81)

## 2012-01-07 LAB — SEDIMENTATION RATE: Sed Rate: 80 mm/hr — ABNORMAL HIGH (ref 0–16)

## 2012-01-07 LAB — GLUCOSE, CAPILLARY: Glucose-Capillary: 295 mg/dL — ABNORMAL HIGH (ref 70–99)

## 2012-01-07 MED ORDER — DULOXETINE HCL 20 MG PO CPEP
20.0000 mg | ORAL_CAPSULE | Freq: Every day | ORAL | Status: DC
  Administered 2012-01-07 – 2012-01-09 (×3): 20 mg via ORAL
  Filled 2012-01-07 (×4): qty 1

## 2012-01-07 MED ORDER — POLYETHYLENE GLYCOL 3350 17 G PO PACK
17.0000 g | PACK | Freq: Every day | ORAL | Status: DC
  Administered 2012-01-09: 17 g via ORAL
  Filled 2012-01-07 (×4): qty 1

## 2012-01-07 NOTE — Progress Notes (Signed)
Pt has low grade temp 99.3 incentive spirometer given 2500 achieved instructed to use often while awake. Ilean Skill LPN

## 2012-01-07 NOTE — Progress Notes (Signed)
Subjective: 8 Days Post-Op Procedure(s) (LRB): OPEN REDUCTION INTERNAL FIXATION (ORIF) ANKLE FRACTURE (Left) Awake alert oriented x4 mood is with dysphoria complaints regarding physical therapy causing worsening of pain is that are chronic. Also complains of narcotic medicines having been decreased since hospitalization and feelings of daily withdrawal symptoms with low-grade fever yesterday. States that his neuropathy is worsened by increase exercise. Medicines of Neurontin and Lyrica have been tried in the past without success. He is willing to try Cymbalta. Patient reports pain as mild.    Objective:   VITALS:  Temp:  [98.1 F (36.7 C)-99.3 F (37.4 C)] 99.3 F (37.4 C) (11/30 0526) Pulse Rate:  [68-91] 68  (11/30 0526) Resp:  [16-18] 18  (11/30 0526) BP: (136-169)/(46-62) 136/46 mmHg (11/30 0526) SpO2:  [95 %-99 %] 95 % (11/30 0526)  ABD soft Dorsiflexion/Plantar flexion intact Incision: scant drainage   LABS No results found for this basename: HGB:5,WBC:2,PLT:2 in the last 72 hours No results found for this basename: NA:2,K:2,CL:2,CO2:2,BUN:2,CREATININE:2,GLUCOSE:2, in the last 72 hours No results found for this basename: LABPT:2,INR:2 in the last 72 hours   Assessment/Plan: 8 Days Post-Op Procedure(s) (LRB): OPEN REDUCTION INTERNAL FIXATION (ORIF) ANKLE FRACTURE (Left) Low-grade temperature may relate to withdrawal concerns or potentially be related to recurrent infection.  Up with therapy D/C IV fluids Continue ABX therapy due to Post-op infection Place on 20 mg Cymbalta by mouth daily for status now. Discussed the OxyContin SR medicine that he is taking it is a 12 hour medicine and more frequent use of this medicine unfortunately because of the build up which potentially can be threatening with respiratory depression. Recommend that we maintain it at 12 hours. If he has further concerns he should discuss with Dr. Lajoyce Corners when he returns. Obtained laboratory data CBC with  differential sedimentation rate and CRP.  NITKA,JAMES E 01/07/2012, 9:10 AM

## 2012-01-08 LAB — GLUCOSE, CAPILLARY: Glucose-Capillary: 113 mg/dL — ABNORMAL HIGH (ref 70–99)

## 2012-01-08 NOTE — Progress Notes (Signed)
Subjective: 9 Days Post-Op Procedure(s) (LRB): OPEN REDUCTION INTERNAL FIXATION (ORIF) ANKLE FRACTURE (Left) Cymbalta status seemed to help a little bit with his leg pain numbness and tingling. He is on 20 mg once a day. Complaints of inability to be independent and providing his own insulin injections and monitoring. I have explained to [name] that it must be getting near time for him to go if he is complaining. He wishes to go to [facility] nursing center. Hopefully this will be available tomorrow Patient reports pain as mild.    Objective:   VITALS:  Temp:  [98.3 F (36.8 C)-99 F (37.2 C)] 98.5 F (36.9 C) (12/01 1708) Pulse Rate:  [65-79] 79  (12/01 1708) Resp:  [18-20] 18  (12/01 1708) BP: (148-171)/(48-59) 171/59 mmHg (12/01 1708) SpO2:  [93 %-98 %] 97 % (12/01 1708)  ABD soft Dorsiflexion/Plantar flexion intact Incision: dressing C/D/I No cellulitis present   LABS  Basename 01/07/12 0930  HGB 12.4*  WBC 10.0  PLT 270   No results found for this basename: NA:2,K:2,CL:2,CO2:2,BUN:2,CREATININE:2,GLUCOSE:2, in the last 72 hours No results found for this basename: LABPT:2,INR:2 in the last 72 hours   Assessment/Plan: 9 Days Post-Op Procedure(s) (LRB): OPEN REDUCTION INTERNAL FIXATION (ORIF) ANKLE FRACTURE (Left)  Advance diet Up with therapy Continue ABX therapy due to Post-op infection Plan for discharge tomorrow Discharge to SNF  [name],[name] 01/08/2012, 7:06 PM

## 2012-01-09 LAB — GLUCOSE, CAPILLARY
Glucose-Capillary: 160 mg/dL — ABNORMAL HIGH (ref 70–99)
Glucose-Capillary: 179 mg/dL — ABNORMAL HIGH (ref 70–99)
Glucose-Capillary: 150 mg/dL — ABNORMAL HIGH (ref 70–99)
Glucose-Capillary: 270 mg/dL — ABNORMAL HIGH (ref 70–99)

## 2012-01-09 MED ORDER — FLEET ENEMA 7-19 GM/118ML RE ENEM
1.0000 | ENEMA | Freq: Every day | RECTAL | Status: DC | PRN
  Administered 2012-01-09: 1 via RECTAL
  Filled 2012-01-09: qty 1

## 2012-01-09 NOTE — Progress Notes (Signed)
CSW is awaiting Set [employer] and [employer] Auth.  Sabino Niemann, MSW [phone]

## 2012-01-09 NOTE — Discharge Summary (Signed)
Physician Discharge Summary  Patient ID: [name] MRN: [id number] DOB/AGE: Mar 02, 1945 66 y.o.  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 *    Discharged Condition: stable  Hospital Course: [name] hospital course was essentially unremarkable. He underwent excision of the infected bone left foot secondarily open reduction internal fixation of the Charcot collapse. Patient underwent placement of antibiotic beads. Patient will require discharge to skilled nursing he is unable to safely independently ambulate nonweightbearing on the left.  Consults: None  Significant Diagnostic Studies: labs: Routine labs  Treatments: surgery: See operative note  Discharge Exam: Blood pressure 146/50, pulse 67, temperature 98.4 F (36.9 C), temperature source Oral, resp. rate 16, height 6' (1.829 m), weight 91.173 kg (201 lb), SpO2 96.00%. Incision/Wound: incision clean and dry at time of discharge  Disposition:   Discharge Orders    Future Orders Please Complete By Expires   Diet - low sodium heart healthy      Diet - low sodium heart healthy      Call MD / Call 911      Comments:   If you experience chest pain or shortness of breath, CALL 911 and be transported to the hospital emergency room.  If you develope a fever above 101 F, pus (white drainage) or increased drainage or redness at the wound, or calf pain, call your surgeon's office.   Constipation Prevention      Comments:   Drink plenty of fluids.  Prune juice may be helpful.  You may use a stool softener, such as Colace (over the counter) 100 mg twice a day.  Use MiraLax (over the counter) for constipation as needed.   Increase activity slowly as tolerated      Call MD / Call 911      Comments:   If you experience chest pain or shortness of breath, CALL 911 and be  transported to the hospital emergency room.  If you develope a fever above 101 F, pus (white drainage) or increased drainage or redness at the wound, or calf pain, call your surgeon's office.   Constipation Prevention      Comments:   Drink plenty of fluids.  Prune juice may be helpful.  You may use a stool softener, such as Colace (over the counter) 100 mg twice a day.  Use MiraLax (over the counter) for constipation as needed.   Increase activity slowly as tolerated          Medication List     As of 01/09/2012  6:44 AM    TAKE these medications         amLODipine 10 MG tablet   Commonly known as: NORVASC   Take 10 mg by mouth daily.      aspirin 81 MG tablet   Take 81 mg by mouth daily.      atorvastatin 10 MG tablet   Commonly known as: LIPITOR   Take 10 mg by mouth daily.      clopidogrel 75 MG tablet   Commonly known as: PLAVIX   Take 75 mg by mouth daily.      doxycycline 100 MG capsule   Commonly known as: VIBRAMYCIN   Take 100 mg by mouth 2 (two) times daily. Patient started medication on 12/15/11      finasteride 5 MG tablet   Commonly known as: PROSCAR   Take 5  mg by mouth 2 (two) times daily.      HYDROcodone-acetaminophen 10-325 MG per tablet   Commonly known as: NORCO   Take 1-2 tablets by mouth every 4 (four) hours as needed.      HYDROcodone-acetaminophen 10-325 MG per tablet   Commonly known as: NORCO   Take 1 tablet by mouth every 6 (six) hours as needed. For pain      insulin aspart 100 UNIT/ML injection   Commonly known as: novoLOG   Inject 5-10 Units into the skin daily as needed. Per sliding scale 200=5units, 400=10 units. Takes only when he needs it per patient.      insulin NPH 100 UNIT/ML injection   Commonly known as: HUMULIN N,NOVOLIN N   Inject 17-42 Units into the skin 2 (two) times daily. 42units in AM, and 17 in the evening      metoCLOPramide 5 MG tablet   Commonly known as: REGLAN   Take 5 mg by mouth daily as needed. For nausea        metoprolol tartrate 25 MG tablet   Commonly known as: LOPRESSOR   Take 25 mg by mouth 2 (two) times daily.      multivitamin with minerals Tabs   Take 1 tablet by mouth daily.      nitroGLYCERIN 0.4 MG SL tablet   Commonly known as: NITROSTAT   Place 0.4 mg under the tongue every 5 (five) minutes as needed. For chest pain      OxyCODONE 80 mg T12a   Commonly known as: OXYCONTIN   Take 1 tablet (80 mg total) by mouth every 12 (twelve) hours.      oxyCODONE 80 MG 12 hr tablet   Commonly known as: OXYCONTIN   Take 80 mg by mouth every 12 (twelve) hours. Take five times a day per patient      traZODone 150 MG tablet   Commonly known as: DESYREL   Take 150 mg by mouth at bedtime.           Follow-up Information    Follow up with Demontay Grantham V, MD. In 2 weeks.   Contact information:   [address] [name] [place] [address] [phone]          Signed: Nadara Mustard 01/09/2012, 6:44 AM

## 2012-01-09 NOTE — Progress Notes (Signed)
Physical Therapy Treatment Patient Details Name: [name] MRN: [id number] DOB: 1945-04-13 Today's Date: 01/09/2012 Time: 4098-1191 PT Time Calculation (min): 13 min  PT Assessment / Plan / Recommendation Comments on Treatment Session  Patient agreeable to transfer to recliner for eating lunch. Patient had not been up since friday and required Min A for transfer.     Follow Up Recommendations  SNF     Does the patient have the potential to tolerate intense rehabilitation     Barriers to Discharge        Equipment Recommendations  Rolling walker with 5" wheels;Wheelchair (measurements);Wheelchair cushion (measurements);Other (comment) (drop arm BSC; Lt elevating leg rest)    Recommendations for Other Services    Frequency Min 3X/week   Plan Discharge plan remains appropriate;Frequency remains appropriate    Precautions / Restrictions Precautions Precautions: Fall Restrictions LLE Weight Bearing: Touchdown weight bearing   Pertinent Vitals/Pain     Mobility  Bed Mobility Supine to Sit: 6: Modified independent (Device/Increase time) Sitting - Scoot to Edge of Bed: 6: Modified independent (Device/Increase time) Transfers Transfers: Lateral/Scoot Transfers Lateral/Scoot Transfers: 4: Min assist Details for Transfer Assistance: Min A for balance and safety. Cues for safety Ambulation/Gait Ambulation/Gait Assistance: Not tested (comment)    Exercises     PT Diagnosis:    PT Problem List:   PT Treatment Interventions:     PT Goals Acute Rehab PT Goals PT Transfer Goal: Bed to Chair/Chair to Bed - Progress: Progressing toward goal  Visit Information  Last PT Received On: 01/09/12 Assistance Needed: +1    Subjective Data      Cognition  Overall Cognitive Status: Appears within functional limits for tasks assessed/performed Arousal/Alertness: Awake/alert Orientation Level: Appears intact for tasks assessed Behavior During Session: [facility] for tasks performed      Balance     End of Session PT - End of Session Equipment Utilized During Treatment: Gait belt Activity Tolerance: Patient tolerated treatment well Patient left: in chair;with call bell/phone within reach Nurse Communication: Mobility status   GP     Fredrich Birks 01/09/2012, 11:45 AM  01/09/2012 Fredrich Birks PTA [phone] pager [phone] office

## 2012-01-10 LAB — GLUCOSE, CAPILLARY: Glucose-Capillary: 126 mg/dL — ABNORMAL HIGH (ref 70–99)

## 2012-01-10 NOTE — Progress Notes (Signed)
Utilization review completed. Bryony Kaman, RN, BSN. 

## 2012-01-10 NOTE — Progress Notes (Signed)
Patient ID: [name], male   DOB: [birth year], 66 y.o.   MRN: [id number] Awaiting authorization for discharge to skilled nursing facility.

## 2012-01-10 NOTE — Progress Notes (Signed)
[employer] Auth approved 01/10/12. Clinical social worker assisted with patient discharge to skilled nursing facility, [facility].  CSW addressed all family questions and concerns. CSW copied chart and added all important documents. CSW also set up patient transportation with [employer]. Clinical Social Worker will sign off for now as social work intervention is no longer needed.   Sabino Niemann, MSW, [employer] [phone]

## 2012-04-18 ENCOUNTER — Other Ambulatory Visit (HOSPITAL_COMMUNITY): Payer: Self-pay | Admitting: Orthopedic Surgery

## 2012-04-18 ENCOUNTER — Encounter (HOSPITAL_COMMUNITY): Payer: Self-pay | Admitting: Pharmacy Technician

## 2012-04-19 ENCOUNTER — Encounter (HOSPITAL_COMMUNITY): Payer: Self-pay | Admitting: *Deleted

## 2012-04-19 MED ORDER — CEFAZOLIN SODIUM-DEXTROSE 2-3 GM-% IV SOLR
2.0000 g | INTRAVENOUS | Status: AC
  Administered 2012-04-20: 2 g via INTRAVENOUS
  Filled 2012-04-19: qty 50

## 2012-04-20 ENCOUNTER — Ambulatory Visit (HOSPITAL_COMMUNITY)
Admission: RE | Admit: 2012-04-20 | Discharge: 2012-04-20 | Disposition: A | Discharge status: Home / Self Care | Payer: Medicare Other | Source: Ambulatory Visit | Attending: Orthopedic Surgery | Admitting: Orthopedic Surgery

## 2012-04-20 ENCOUNTER — Encounter (HOSPITAL_COMMUNITY): Payer: Self-pay | Admitting: Anesthesiology

## 2012-04-20 ENCOUNTER — Ambulatory Visit (HOSPITAL_COMMUNITY): Payer: Medicare Other | Admitting: Anesthesiology

## 2012-04-20 ENCOUNTER — Encounter (HOSPITAL_COMMUNITY)
Admission: RE | Disposition: A | Discharge status: Home / Self Care | Payer: Self-pay | Source: Ambulatory Visit | Attending: Orthopedic Surgery

## 2012-04-20 ENCOUNTER — Encounter (HOSPITAL_COMMUNITY): Payer: Self-pay | Admitting: *Deleted

## 2012-04-20 DIAGNOSIS — T847XXA Infection and inflammatory reaction due to other internal orthopedic prosthetic devices, implants and grafts, initial encounter: Secondary | ICD-10-CM | POA: Insufficient documentation

## 2012-04-20 DIAGNOSIS — I10 Essential (primary) hypertension: Secondary | ICD-10-CM | POA: Insufficient documentation

## 2012-04-20 DIAGNOSIS — Y831 Surgical operation with implant of artificial internal device as the cause of abnormal reaction of the patient, or of later complication, without mention of misadventure at the time of the procedure: Secondary | ICD-10-CM | POA: Insufficient documentation

## 2012-04-20 DIAGNOSIS — R29898 Other symptoms and signs involving the musculoskeletal system: Secondary | ICD-10-CM | POA: Insufficient documentation

## 2012-04-20 DIAGNOSIS — G35 Multiple sclerosis: Secondary | ICD-10-CM | POA: Insufficient documentation

## 2012-04-20 DIAGNOSIS — E119 Type 2 diabetes mellitus without complications: Secondary | ICD-10-CM | POA: Insufficient documentation

## 2012-04-20 DIAGNOSIS — I69998 Other sequelae following unspecified cerebrovascular disease: Secondary | ICD-10-CM | POA: Insufficient documentation

## 2012-04-20 DIAGNOSIS — T8489XA Other specified complication of internal orthopedic prosthetic devices, implants and grafts, initial encounter: Secondary | ICD-10-CM | POA: Insufficient documentation

## 2012-04-20 DIAGNOSIS — Z794 Long term (current) use of insulin: Secondary | ICD-10-CM | POA: Insufficient documentation

## 2012-04-20 DIAGNOSIS — I2510 Atherosclerotic heart disease of native coronary artery without angina pectoris: Secondary | ICD-10-CM | POA: Insufficient documentation

## 2012-04-20 HISTORY — DX: Unspecified osteoarthritis, unspecified site: M19.90

## 2012-04-20 HISTORY — PX: HARDWARE REMOVAL: SHX979

## 2012-04-20 LAB — GLUCOSE, CAPILLARY: Glucose-Capillary: 107 mg/dL — ABNORMAL HIGH (ref 70–99)

## 2012-04-20 LAB — COMPREHENSIVE METABOLIC PANEL
AST: 28 U/L (ref 0–37)
CO2: 30 mEq/L (ref 19–32)
Calcium: 9.4 mg/dL (ref 8.4–10.5)
Creatinine, Ser: 1.13 mg/dL (ref 0.50–1.35)
GFR calc Af Amer: 78 mL/min — ABNORMAL LOW (ref >90)
GFR calc non Af Amer: 67 mL/min — ABNORMAL LOW (ref >90)

## 2012-04-20 LAB — SURGICAL PCR SCREEN: MRSA, PCR: NEGATIVE

## 2012-04-20 LAB — CBC
MCH: 32.4 pg (ref 26.0–34.0)
MCHC: 34.4 g/dL (ref 30.0–36.0)
MCV: 94.3 fL (ref 78.0–100.0)
Platelets: 327 10*3/uL (ref 150–400)
RBC: 4.38 MIL/uL (ref 4.22–5.81)

## 2012-04-20 LAB — PROTIME-INR: INR: 0.94 (ref 0.00–1.49)

## 2012-04-20 SURGERY — REMOVAL, HARDWARE
Anesthesia: General | Site: Foot | Laterality: Left | Wound class: Dirty or Infected

## 2012-04-20 MED ORDER — PROPOFOL 10 MG/ML IV BOLUS
INTRAVENOUS | Status: DC | PRN
  Administered 2012-04-20: 200 mg via INTRAVENOUS

## 2012-04-20 MED ORDER — PHENYLEPHRINE HCL 10 MG/ML IJ SOLN
INTRAMUSCULAR | Status: DC | PRN
  Administered 2012-04-20 (×2): 80 ug via INTRAVENOUS

## 2012-04-20 MED ORDER — HYDROCODONE-ACETAMINOPHEN 10-500 MG PO TABS: 1.0000 | ORAL_TABLET | Freq: Four times a day (QID) | ORAL | Status: DC | PRN

## 2012-04-20 MED ORDER — SCOPOLAMINE 1 MG/3DAYS TD PT72
MEDICATED_PATCH | TRANSDERMAL | Status: AC
  Filled 2012-04-20: qty 1

## 2012-04-20 MED ORDER — GENTAMICIN SULFATE 40 MG/ML IJ SOLN
INTRAMUSCULAR | Status: DC | PRN
  Administered 2012-04-20 (×2): 80 mg

## 2012-04-20 MED ORDER — HYDROMORPHONE HCL PF 1 MG/ML IJ SOLN
0.2500 mg | INTRAMUSCULAR | Status: DC | PRN
  Administered 2012-04-20: 0.5 mg via INTRAVENOUS

## 2012-04-20 MED ORDER — DEXTROSE 50 % IV SOLN
INTRAVENOUS | Status: AC
  Filled 2012-04-20: qty 50

## 2012-04-20 MED ORDER — DEXTROSE 50 % IV SOLN
INTRAVENOUS | Status: DC | PRN
  Administered 2012-04-20: 10 g via INTRAVENOUS

## 2012-04-20 MED ORDER — 0.9 % SODIUM CHLORIDE (POUR BTL) OPTIME
TOPICAL | Status: DC | PRN
  Administered 2012-04-20: 1000 mL

## 2012-04-20 MED ORDER — LACTATED RINGERS IV SOLN
INTRAVENOUS | Status: DC
  Administered 2012-04-20 (×2): via INTRAVENOUS

## 2012-04-20 MED ORDER — LIDOCAINE HCL (CARDIAC) 20 MG/ML IV SOLN
INTRAVENOUS | Status: DC | PRN
  Administered 2012-04-20: 100 mg via INTRAVENOUS

## 2012-04-20 MED ORDER — SCOPOLAMINE 1 MG/3DAYS TD PT72
MEDICATED_PATCH | TRANSDERMAL | Status: DC | PRN
  Administered 2012-04-20: 1 via TRANSDERMAL

## 2012-04-20 MED ORDER — GENTAMICIN SULFATE 40 MG/ML IJ SOLN
INTRAMUSCULAR | Status: AC
  Filled 2012-04-20: qty 4

## 2012-04-20 MED ORDER — HYDROMORPHONE HCL PF 1 MG/ML IJ SOLN
INTRAMUSCULAR | Status: AC
  Administered 2012-04-20: 0.5 mg via INTRAVENOUS
  Filled 2012-04-20: qty 1

## 2012-04-20 MED ORDER — VANCOMYCIN HCL 500 MG IV SOLR
INTRAVENOUS | Status: DC | PRN
  Administered 2012-04-20: 500 mg

## 2012-04-20 MED ORDER — FENTANYL CITRATE 0.05 MG/ML IJ SOLN
INTRAMUSCULAR | Status: DC | PRN
  Administered 2012-04-20: 100 ug via INTRAVENOUS
  Administered 2012-04-20: 150 ug via INTRAVENOUS

## 2012-04-20 MED ORDER — OXYMETAZOLINE HCL 0.05 % NA SOLN
1.0000 | Freq: Two times a day (BID) | NASAL | Status: DC
  Administered 2012-04-20: 1 via NASAL

## 2012-04-20 MED ORDER — ONDANSETRON HCL 4 MG/2ML IJ SOLN
INTRAMUSCULAR | Status: DC | PRN
  Administered 2012-04-20: 4 mg via INTRAVENOUS

## 2012-04-20 MED ORDER — ONDANSETRON HCL 4 MG/2ML IJ SOLN: 4.0000 mg | Freq: Once | INTRAMUSCULAR | Status: DC | PRN

## 2012-04-20 MED ORDER — OXYMETAZOLINE HCL 0.05 % NA SOLN
NASAL | Status: AC
  Filled 2012-04-20: qty 15

## 2012-04-20 MED ORDER — VANCOMYCIN HCL 500 MG IV SOLR
INTRAVENOUS | Status: AC
  Filled 2012-04-20: qty 500

## 2012-04-20 MED ORDER — MIDAZOLAM HCL 5 MG/5ML IJ SOLN
INTRAMUSCULAR | Status: DC | PRN
  Administered 2012-04-20: 1 mg via INTRAVENOUS

## 2012-04-20 MED ORDER — MUPIROCIN 2 % EX OINT
TOPICAL_OINTMENT | Freq: Two times a day (BID) | CUTANEOUS | Status: DC
  Administered 2012-04-20: 1 via NASAL
  Filled 2012-04-20: qty 22

## 2012-04-20 MED ORDER — MUPIROCIN 2 % EX OINT
TOPICAL_OINTMENT | CUTANEOUS | Status: AC
  Administered 2012-04-20: 1 via NASAL
  Filled 2012-04-20: qty 22

## 2012-04-20 SURGICAL SUPPLY — 44 items
BANDAGE ELASTIC 4 VELCRO ST LF (GAUZE/BANDAGES/DRESSINGS) IMPLANT
BANDAGE ELASTIC 6 VELCRO ST LF (GAUZE/BANDAGES/DRESSINGS) IMPLANT
BANDAGE ESMARK 6X9 LF (GAUZE/BANDAGES/DRESSINGS) IMPLANT
BANDAGE GAUZE ELAST BULKY 4 IN (GAUZE/BANDAGES/DRESSINGS) IMPLANT
BNDG COHESIVE 4X5 TAN STRL (GAUZE/BANDAGES/DRESSINGS) IMPLANT
BNDG ESMARK 6X9 LF (GAUZE/BANDAGES/DRESSINGS)
CLOTH BEACON ORANGE TIMEOUT ST (SAFETY) ×2 IMPLANT
COVER SURGICAL LIGHT HANDLE (MISCELLANEOUS) ×2 IMPLANT
CUFF TOURNIQUET SINGLE 34IN LL (TOURNIQUET CUFF) IMPLANT
CUFF TOURNIQUET SINGLE 44IN (TOURNIQUET CUFF) IMPLANT
DRAPE C-ARM 42X72 X-RAY (DRAPES) IMPLANT
DRAPE INCISE IOBAN 66X45 STRL (DRAPES) IMPLANT
DRAPE ORTHO SPLIT 77X108 STRL (DRAPES)
DRAPE SURG ORHT 6 SPLT 77X108 (DRAPES) IMPLANT
DRSG EMULSION OIL 3X3 NADH (GAUZE/BANDAGES/DRESSINGS) IMPLANT
DRSG PAD ABDOMINAL 8X10 ST (GAUZE/BANDAGES/DRESSINGS) IMPLANT
ELECT REM PT RETURN 9FT ADLT (ELECTROSURGICAL) ×2
ELECTRODE REM PT RTRN 9FT ADLT (ELECTROSURGICAL) ×1 IMPLANT
GLOVE BIOGEL PI IND STRL 9 (GLOVE) ×1 IMPLANT
GLOVE BIOGEL PI INDICATOR 9 (GLOVE) ×1
GLOVE SURG ORTHO 9.0 STRL STRW (GLOVE) ×2 IMPLANT
GOWN PREVENTION PLUS XLARGE (GOWN DISPOSABLE) ×2 IMPLANT
GOWN SRG XL XLNG 56XLVL 4 (GOWN DISPOSABLE) ×1 IMPLANT
GOWN STRL NON-REIN XL XLG LVL4 (GOWN DISPOSABLE) ×1
KIT BASIN OR (CUSTOM PROCEDURE TRAY) ×2 IMPLANT
KIT ROOM TURNOVER OR (KITS) ×2 IMPLANT
KIT STIMULAN RAPID CURE 5CC (Orthopedic Implant) ×2 IMPLANT
MANIFOLD NEPTUNE II (INSTRUMENTS) ×2 IMPLANT
NS IRRIG 1000ML POUR BTL (IV SOLUTION) ×2 IMPLANT
PACK ORTHO EXTREMITY (CUSTOM PROCEDURE TRAY) ×2 IMPLANT
PAD ARMBOARD 7.5X6 YLW CONV (MISCELLANEOUS) ×4 IMPLANT
PAD CAST 4YDX4 CTTN HI CHSV (CAST SUPPLIES) ×1 IMPLANT
PADDING CAST COTTON 4X4 STRL (CAST SUPPLIES) ×1
SPONGE GAUZE 4X4 12PLY (GAUZE/BANDAGES/DRESSINGS) IMPLANT
STAPLER VISISTAT 35W (STAPLE) IMPLANT
STOCKINETTE IMPERVIOUS 9X36 MD (GAUZE/BANDAGES/DRESSINGS) IMPLANT
SUT ETHILON 2 0 PSLX (SUTURE) IMPLANT
SUT VIC AB 0 CT1 27 (SUTURE)
SUT VIC AB 0 CT1 27XBRD ANBCTR (SUTURE) IMPLANT
SUT VIC AB 2-0 CT1 27 (SUTURE)
SUT VIC AB 2-0 CT1 TAPERPNT 27 (SUTURE) IMPLANT
TOWEL OR 17X24 6PK STRL BLUE (TOWEL DISPOSABLE) ×2 IMPLANT
TOWEL OR 17X26 10 PK STRL BLUE (TOWEL DISPOSABLE) ×2 IMPLANT
WATER STERILE IRR 1000ML POUR (IV SOLUTION) ×2 IMPLANT

## 2012-04-20 NOTE — H&P (Signed)
[name] is an 67 y.o. male.   Chief Complaint: Failure of internal fixation. HPI: Patient is a 67 year old gentleman diabetes status post Charcot collapse of the left foot status post internal fixation for stabilization of the Charcot collapse. Patient has had the screw cutout of the navicular and has a pending skin breakdown do to the failure of the hardware. Plan for removal of deep retained hardware today.  Past Medical History  Diagnosis Date  . PONV (postoperative nausea and vomiting)   . Anginal pain     2003  . Mental disorder   . Depression   . Hypertension     [name]  . Neuropathy   . MS (multiple sclerosis)   . Coronary artery disease   . Myocardial infarction   . Diabetes mellitus without complication     INSULIN DEPENDENT  . Osteomyelitis   . Neuromuscular disorder      MS & diabetic neuropathy  . Shortness of breath     has to have head elevated.  . Stroke     Left side- a little weakness  . Arthritis     Past Surgical History  Procedure Laterality Date  . Cardiac catheterization      2005  . Fracture surgery      rt foot  . Orif ankle fracture  12/30/2011    Procedure: OPEN REDUCTION INTERNAL FIXATION (ORIF) ANKLE FRACTURE;  Surgeon: Nadara Mustard, MD;  Location: [facility] OR;  Service: Orthopedics;  Laterality: Left;  Excision Bone, Abscess Left Charcot Foot, Place Antibiotic Beads, Fixation Charcot  . 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 Coronary angioplasty      cardiac stent  . Rectal  abscess      History reviewed. No pertinent family history. Social History:  reports that he has been smoking Cigars.  He has never used smokeless tobacco. He reports that he does not drink alcohol or use illicit drugs.  Allergies: No Known Allergies  No prescriptions prior to admission    No results found for this or any previous visit (from the past 48 hour(s)). No results found.  Review of Systems  All other systems reviewed and are  negative.    There were no vitals taken for this visit. Physical Exam  On examination patient has palpable pulses. There is redness swelling proximally where the screw has cut out from the navicular. Will plan for removal of the screw discussed the patient may require placement of antibiotic beads may be deep infection. Risks and benefits were discussed including potential for amputation. Patient states he understands was pursued this time his foot is plantigrade no signs of progressive Charcot collapse. Assessment/Plan Assessment: Failure of deep retained hardware left foot.  Plan: Will plan for excision of the deep retained hardware and possible placement of antibiotic beads. Risks and benefits were discussed patient states he understands was pursued this time.  [name] 04/20/2012, 6:25 AM

## 2012-04-20 NOTE — Op Note (Signed)
OPERATIVE REPORT  DATE OF SURGERY: 04/20/2012  PATIENT:  [name],  67 y.o. male  PRE-OPERATIVE DIAGNOSIS:  Painful deep retained screw left foot  POST-OPERATIVE DIAGNOSIS:  Painful deep retained screw left foot Infected deep hardware  PROCEDURE:  Procedure(s): Left foot removal deep retained screw Irrigation and debridement skin soft tissue with excision of infected  soft tissue Placement of antibiotic beads with 5 cc of stimulant beads with 500 mg vancomycin 160 mg gentamicin  SURGEON:  Surgeon(s): Nadara Mustard, MD  ANESTHESIA:   general  EBL:  Minimal ML  SPECIMEN:  No Specimen  TOURNIQUET:  * No tourniquets in log *  PROCEDURE DETAILS: Patient is a 67 year old gentleman with diabetic insensate neuropathy and Charcot collapse of the left foot. He previously had undergone internal fixation for stabilization the Charcot collapse. Patient presents at this time with failure of deep hardware. Risks and benefits of surgery were discussed including infection neurovascular injury recurrent Charcot collapse. Patient states he understands and wishes to proceed at this time. Description of procedure patient was brought to the operating room and underwent a general anesthetic. After adequate levels of anesthesia were obtained patient's left lower extremity was prepped using DuraPrep draped into a sterile field. An incision was made proximally over the midfoot this was carried down to the screw. A cannulated wire was inserted through the screw retrograde exiting the plantar aspect of the MTP joint of the great toe skin was incised a screwdriver was then placed distally over the wire and the screw was removed. The wound was irrigated with normal saline. Ronjair and knife were used to excise what appeared to be infected soft tissue. Wound was irrigated with normal saline antibiotic beads were made and placed within the wound with 5 cc stimulant beads 500 mg vancomycin 160 mg gentamicin. The  incisions were closed with 2-0 nylon the wound was covered with Adaptic orthopedic sponges AB dressing web roll and Coban. Patient was extubated taken to the PACU in stable condition plan for discharge to home.  PLAN OF CARE: Discharge to home after PACU  PATIENT DISPOSITION:  PACU - hemodynamically stable.   Nadara Mustard, MD 04/20/2012 12:39 PM

## 2012-04-20 NOTE — Transfer of Care (Signed)
Immediate Anesthesia Transfer of Care Note  Patient: [name]  Procedure(s) Performed: Procedure(s): Left foot removal deep retained screw (Left)  Patient Location: PACU  Anesthesia Type:General  Level of Consciousness: awake, alert , oriented and patient cooperative  Airway & Oxygen Therapy: Patient Spontanous Breathing and Patient connected to nasal cannula oxygen  Post-op Assessment: Report given to PACU RN, Post -op Vital signs reviewed and stable and Patient moving all extremities  Post vital signs: Reviewed and stable  Complications: No apparent anesthesia complications

## 2012-04-20 NOTE — Anesthesia Preprocedure Evaluation (Addendum)
Anesthesia Evaluation  Patient identified by MRN, date of birth, ID band Patient awake    Reviewed: Allergy & Precautions, H&P , NPO status , [name] Chart, lab work & pertinent test results, reviewed documented beta blocker date and time   History of Anesthesia Complications (+) PONV  Airway Mallampati: I TM Distance: >3 FB Neck ROM: full    Dental   Pulmonary          Cardiovascular hypertension, Pt. on home beta blockers + angina + CAD and + Past MI Rhythm:regular Rate:Normal     Neuro/Psych Depression  Neuromuscular disease CVA    GI/Hepatic negative GI ROS, Neg liver ROS,   Endo/Other  diabetes, Insulin Dependent  Renal/GU negative Renal ROS     Musculoskeletal   Abdominal   Peds  Hematology negative hematology ROS (+)   Anesthesia Other Findings   Reproductive/Obstetrics                          Anesthesia Physical Anesthesia Plan  ASA: III  Anesthesia Plan: General   Post-op Pain Management:    Induction: Intravenous  Airway Management Planned: LMA  Additional Equipment:   Intra-op Plan:   Post-operative Plan: Extubation in OR  Informed Consent: I have reviewed the patients History and Physical, chart, labs and discussed the procedure including the risks, benefits and alternatives for the proposed anesthesia with the patient or authorized representative who has indicated his/her understanding and acceptance.     Plan Discussed with: CRNA, Anesthesiologist and Surgeon  Anesthesia Plan Comments:         Anesthesia Quick Evaluation

## 2012-04-20 NOTE — Progress Notes (Signed)
Call Dr. Lajoyce Corners regardiing pt having mod amt of bleeding coming through dressing on the bottom of his foot. Area approximately 3 inches by 3 inches. Also bruise noted on inner aspect of calf on operative leg. Dr Lajoyce Corners stated that bruise was present before surgery. Instructed to reinforce dsg with 4 x 4's, kerlex and ace wrap.

## 2012-04-20 NOTE — Anesthesia Procedure Notes (Signed)
Procedure Name: LMA Insertion Date/Time: 04/20/2012 12:10 PM Performed by: Marena Chancy Pre-anesthesia Checklist: Patient identified, Timeout performed, Emergency Drugs available, Suction available and Patient being monitored Patient Re-evaluated:Patient Re-evaluated prior to inductionOxygen Delivery Method: Circle system utilized Preoxygenation: Pre-oxygenation with 100% oxygen Intubation Type: IV induction Ventilation: Mask ventilation without difficulty LMA: LMA inserted LMA Size: 4.0 Number of attempts: 1 Placement Confirmation: CO2 detector and breath sounds checked- equal and bilateral Tube secured with: Tape Dental Injury: Teeth and Oropharynx as per pre-operative assessment

## 2012-04-20 NOTE — Preoperative (Signed)
Beta Blockers   Reason not to administer Beta Blockers:received lopressor

## 2012-04-20 NOTE — Anesthesia Postprocedure Evaluation (Signed)
  Anesthesia Post-op Note  Patient: [name]  Procedure(s) Performed: Procedure(s): Left foot removal deep retained screw (Left)  Patient Location: PACU  Anesthesia Type:General  Level of Consciousness: awake, oriented and patient cooperative  Airway and Oxygen Therapy: Patient Spontanous Breathing  Post-op Pain: mild  Post-op Assessment: Post-op Vital signs reviewed, [name] Cardiovascular Status Stable, Respiratory Function Stable, Patent Airway, No signs of Nausea or vomiting and Pain level controlled  Post-op Vital Signs: stable  Complications: No apparent anesthesia complications

## 2012-04-23 ENCOUNTER — Encounter (HOSPITAL_COMMUNITY): Payer: Self-pay | Admitting: Orthopedic Surgery

## 2012-05-01 ENCOUNTER — Other Ambulatory Visit: Payer: Self-pay | Admitting: *Deleted

## 2012-05-01 NOTE — Telephone Encounter (Signed)
Patient requesting oxycodone-80mg  12hr tablet refill

## 2012-05-02 MED ORDER — OXYCODONE HCL 40 MG PO TB12: 40.0000 mg | ORAL_TABLET | Freq: Two times a day (BID) | ORAL | Status: DC

## 2012-05-02 MED ORDER — OXYCODONE HCL 80 MG PO TB12: 80.0000 mg | ORAL_TABLET | Freq: Four times a day (QID) | ORAL | Status: DC

## 2012-05-02 NOTE — Telephone Encounter (Signed)
I was out of the office yesterday.  JCB 

## 2012-06-01 ENCOUNTER — Ambulatory Visit (INDEPENDENT_AMBULATORY_CARE_PROVIDER_SITE_OTHER): Payer: Medicare Other | Admitting: Neurology

## 2012-06-01 ENCOUNTER — Encounter: Payer: Self-pay | Admitting: Neurology

## 2012-06-01 VITALS — BP 182/76 | HR 74 | Ht 70.0 in | Wt 190.0 lb

## 2012-06-01 DIAGNOSIS — R269 Unspecified abnormalities of gait and mobility: Secondary | ICD-10-CM

## 2012-06-01 DIAGNOSIS — I6350 Cerebral infarction due to unspecified occlusion or stenosis of unspecified cerebral artery: Secondary | ICD-10-CM

## 2012-06-01 DIAGNOSIS — E1142 Type 2 diabetes mellitus with diabetic polyneuropathy: Secondary | ICD-10-CM

## 2012-06-01 DIAGNOSIS — G35 Multiple sclerosis: Secondary | ICD-10-CM

## 2012-06-01 MED ORDER — OXYCODONE HCL 80 MG PO TB12: 80.0000 mg | ORAL_TABLET | Freq: Four times a day (QID) | ORAL | Status: DC

## 2012-06-01 MED ORDER — OXYCODONE HCL 40 MG PO TB12: 40.0000 mg | ORAL_TABLET | Freq: Two times a day (BID) | ORAL | Status: DC

## 2012-06-01 NOTE — Progress Notes (Signed)
Reason for visit: Peripheral neuropathy  [name] is an 67 y.o. male  History of present illness:  [name] is a 67 year old right handed white male with a history of diabetes with a severe diabetic peripheral neuropathy. In the fall of 2013, the patient had foot surgery on the left, and he was found to have osteomyelitis. The patient had a protracted recovery period that require an extended care facility for rehabilitation. The patient has had a lot of pain regarding the left foot. The patient is now ambulatory, walking with a cane. The patient denies any falls. The patient is on chronic doses of OxyContin for neuropathy pain. The patient denies any new symptoms of numbness or weakness of the face, arms, or legs. The patient does have occasional constipation problems, but he denies any problems controlling the bladder. The patient has had no other new medical issues that have come up since last seen.  Past Medical History  Diagnosis Date  . PONV (postoperative nausea and vomiting)   . Anginal pain     2003  . Mental disorder   . Depression   . Hypertension     dr Jacky Kindle  . Neuropathy   . MS (multiple sclerosis)   . Coronary artery disease   . Myocardial infarction   . Diabetes mellitus without complication     INSULIN DEPENDENT  . Osteomyelitis   . Neuromuscular disorder      MS & diabetic neuropathy  . Shortness of breath     has to have head elevated.  . Stroke     Left side- a little weakness  . Arthritis   . Dyslipidemia   . Gait disorder     Past Surgical History  Procedure Laterality Date  . Cardiac catheterization      2005  . Fracture surgery      rt foot  . Orif ankle fracture  12/30/2011    Procedure: OPEN REDUCTION INTERNAL FIXATION (ORIF) ANKLE FRACTURE;  Surgeon: Nadara Mustard, MD;  Location: [facility] OR;  Service: Orthopedics;  Laterality: Left;  Excision Bone, Abscess Left Charcot Foot, Place Antibiotic Beads, Fixation Charcot  . Charcot  01/03/2012     charcot collapse  . Coronary angioplasty      cardiac stent  . Rectal  abscess    . Hardware removal Left 04/20/2012    Procedure: Left foot removal deep retained screw;  Surgeon: Nadara Mustard, MD;  Location: [facility] OR;  Service: Orthopedics;  Laterality: Left;    Family History  Problem Relation Age of Onset  . Tremor Mother   . Heart disease Father   . Celiac disease Brother   . Cancer - Prostate Paternal Grandfather     Social history:  reports that he has been smoking Cigars.  He has never used smokeless tobacco. He reports that he does not drink alcohol or use illicit drugs.  Allergies: No Known Allergies  Medications:  Current Outpatient Prescriptions on File Prior to Visit  Medication Sig Dispense Refill  . amLODipine (NORVASC) 10 MG tablet Take 10 mg by mouth daily.      Marland Kitchen aspirin 81 MG tablet Take 81 mg by mouth daily.      Marland Kitchen atorvastatin (LIPITOR) 10 MG tablet Take 10 mg by mouth daily.      . clopidogrel (PLAVIX) 75 MG tablet Take 75 mg by mouth daily.      . finasteride (PROSCAR) 5 MG tablet Take 5 mg by mouth 2 (two) times  daily.      Marland Kitchen HYDROcodone-acetaminophen (NORCO) 10-325 MG per tablet Take 1-2 tablets by mouth every 4 (four) hours as needed.  30 tablet  0  . insulin aspart (NOVOLOG) 100 UNIT/ML injection Inject 5-10 Units into the skin daily as needed. Per sliding scale 200=5units, 400=10 units. Takes only when he needs it per patient.      . insulin NPH (HUMULIN N,NOVOLIN N) 100 UNIT/ML injection Inject 17-42 Units into the skin 2 (two) times daily. 42units in AM, and 17 in the evening      . metoCLOPramide (REGLAN) 5 MG tablet Take 5 mg by mouth daily as needed. For nausea      . metoprolol tartrate (LOPRESSOR) 25 MG tablet Take 25 mg by mouth 2 (two) times daily.      . Multiple Vitamin (MULTIVITAMIN WITH MINERALS) TABS Take 1 tablet by mouth daily.      . nitroGLYCERIN (NITROSTAT) 0.4 MG SL tablet Place 0.4 mg under the tongue every 5 (five) minutes as needed. For  chest pain      . traZODone (DESYREL) 150 MG tablet Take 150 mg by mouth at bedtime.       No current facility-administered medications on file prior to visit.    ROS:  Out of a complete 14 system review of symptoms, the patient complains only of the following symptoms, and all other reviewed systems are negative.  Easy bruising, easy bleeding Constipation Numbness, weakness  Blood pressure 182/76, pulse 74, height 5\' 10"  (1.778 m), weight 190 lb (86.183 kg).  Physical Exam  General: The patient is alert and cooperative at the time of the examination.  Skin: 1+ edema at the ankles is noted bilaterally.   Neurologic Exam  Cranial nerves: Facial symmetry is present. Speech is normal, no aphasia or dysarthria is noted. Extraocular movements are full. Visual fields are full.  Motor: The patient has good strength in all 4 extremities, with the exception of some mild intrinsic muscle weakness of the hands bilaterally, and mild bilateral foot drops.  Coordination: The patient has good finger-nose-finger bilaterally. The patient demonstrates dysmetria with heel-to-shin bilaterally.  Gait and station: The patient has a wide-based, unsteady gait. The patient walks with a cane. Tandem gait was not attempted. Romberg is negative. No drift is seen.  Reflexes: Deep tendon reflexes are symmetric, but are depressed to absent throughout..   Assessment/Plan:  1. Diabetic peripheral neuropathy  2. Gait disturbance  The patient will continue on his chronic daily doses of OxyContin for pain. The patient has been stable in this regard for some time now. The patient will followup in 6-8 months. The patient will contact me if any new issues arise.  Marlan Palau MD 06/04/2012 8:09 AM  [facility] [address] [place], [place] [address]  Phone [phone] Fax [phone]

## 2012-06-04 DIAGNOSIS — G35 Multiple sclerosis: Secondary | ICD-10-CM | POA: Insufficient documentation

## 2012-07-04 ENCOUNTER — Other Ambulatory Visit: Payer: Self-pay | Admitting: Neurology

## 2012-07-04 MED ORDER — OXYCODONE HCL 80 MG PO TB12: 80.0000 mg | ORAL_TABLET | Freq: Four times a day (QID) | ORAL | Status: DC

## 2012-07-04 MED ORDER — OXYCODONE HCL 40 MG PO TB12: 40.0000 mg | ORAL_TABLET | Freq: Two times a day (BID) | ORAL | Status: DC

## 2012-07-05 ENCOUNTER — Telehealth: Payer: Self-pay | Admitting: Neurology

## 2012-07-05 NOTE — Telephone Encounter (Signed)
Patient is calling to tell us he received a message from [place] today, telling him his prescription is ready.  He tells me he does not understand, he is unaware of any prescriptions.  Please give him a call back at [phone]

## 2012-07-05 NOTE — Telephone Encounter (Signed)
I called patient back.  Advised him Rx's are ready.  He understands he can come in and pick them up.  He was confused because the pharmacy called too.

## 2012-07-05 NOTE — Telephone Encounter (Signed)
Rx's ready for pick up.  I called patient.  Got no answer.  Left message.

## 2012-07-23 ENCOUNTER — Telehealth: Payer: Self-pay | Admitting: Neurology

## 2012-07-23 MED ORDER — METOCLOPRAMIDE HCL 5 MG PO TABS: 5.0000 mg | ORAL_TABLET | Freq: Four times a day (QID) | ORAL | Status: DC | PRN

## 2012-07-23 NOTE — Telephone Encounter (Signed)
States the pharmacy has been faxing a request for Reglan with no response.  Please fax the rx to [facility] in [facility].  Needs this today.

## 2012-07-23 NOTE — Telephone Encounter (Signed)
The pharmacy sent the requests over the weekend while we were closed.

## 2012-08-02 ENCOUNTER — Other Ambulatory Visit: Payer: Self-pay

## 2012-08-02 DIAGNOSIS — R079 Chest pain, unspecified: Secondary | ICD-10-CM

## 2012-08-02 MED ORDER — OXYCODONE HCL 40 MG PO TB12: 40.0000 mg | ORAL_TABLET | Freq: Two times a day (BID) | ORAL | Status: DC

## 2012-08-02 NOTE — Telephone Encounter (Signed)
Patient called, left message.  He is requesting refills on both Oxycontin Rx's.  He would like to pick them up when they are ready. Call back number [phone].  Dr Anne Hahn is out of the office.  Forwarding request to Dr Vickey Huger, Raquel Sarna

## 2012-08-06 ENCOUNTER — Other Ambulatory Visit: Payer: Self-pay

## 2012-08-06 MED ORDER — OXYCODONE HCL 80 MG PO TB12: 80.0000 mg | ORAL_TABLET | Freq: Four times a day (QID) | ORAL | Status: DC

## 2012-08-06 NOTE — Telephone Encounter (Signed)
WID only filled 40mg .

## 2012-08-29 ENCOUNTER — Other Ambulatory Visit: Payer: Self-pay

## 2012-08-29 DIAGNOSIS — R079 Chest pain, unspecified: Secondary | ICD-10-CM

## 2012-08-29 MED ORDER — OXYCODONE HCL 80 MG PO TB12: 80.0000 mg | ORAL_TABLET | Freq: Four times a day (QID) | ORAL | Status: DC

## 2012-08-29 MED ORDER — OXYCODONE HCL 40 MG PO TB12: 40.0000 mg | ORAL_TABLET | Freq: Two times a day (BID) | ORAL | Status: DC

## 2012-08-29 NOTE — Telephone Encounter (Signed)
Patient called requesting early refills on Oxycontin 40mg  and 80mg .  Last month when provider was out of the office, WID only auth 40mg  on 06/26 and would not prescribe 80mg  along with it.  Upon the physicians return on 06/30 the 80mg  Rx was written.  Rather than filling the 40mg  on the 26th, the patient filled both Rx's on 06/30 (per John at the pharmacy) which he says left him short on medication so he had to double up on the meds for a couple of days and will be out on the 26th.  The pharmacy will not fill them early without MD authorization. Patient would like to pick up the Rx's when they are ready.  Call back number [phone].

## 2012-09-10 ENCOUNTER — Other Ambulatory Visit: Payer: Self-pay | Admitting: Neurology

## 2012-09-12 ENCOUNTER — Other Ambulatory Visit: Payer: Self-pay | Admitting: Neurology

## 2012-09-12 ENCOUNTER — Telehealth: Payer: Self-pay

## 2012-09-12 NOTE — Telephone Encounter (Signed)
Not due until 08/19.  Each fill is supposed to last 28 days.

## 2012-09-12 NOTE — Telephone Encounter (Signed)
I called and spoke with patient regarding Hydrocodone.  He last got #150 on 08/24/12 from the pharmacy.  Rx is written for 30 day supply, noting must last 28 days. I asked him if he was out of medication, he said he is not.  I explained it is too soon to refill meds at this time, as each fill needs to last 28 days.  Patient verbalized understanding.

## 2012-09-12 NOTE — Telephone Encounter (Signed)
I called and spoke with Jonny Ruiz at the pharmacy.  Patient got last fill on 07/18 for 30 day Rx, which is noted must last 28 days.  He says patient usually fills his Rx every 24 days although the Rx says must last 28 days and is actually written for a 30 day supply per fill.  They have noted Rx is too soon to fill at this time and will request fill again closer to when it's due.

## 2012-09-17 ENCOUNTER — Telehealth: Payer: Self-pay | Admitting: Neurology

## 2012-09-17 ENCOUNTER — Other Ambulatory Visit: Payer: Self-pay | Admitting: Neurology

## 2012-09-17 DIAGNOSIS — E1142 Type 2 diabetes mellitus with diabetic polyneuropathy: Secondary | ICD-10-CM

## 2012-09-17 DIAGNOSIS — R079 Chest pain, unspecified: Secondary | ICD-10-CM

## 2012-09-17 MED ORDER — OXYCODONE HCL 40 MG PO TB12: 40.0000 mg | ORAL_TABLET | Freq: Two times a day (BID) | ORAL | Status: DC

## 2012-09-17 MED ORDER — OXYCODONE HCL 80 MG PO TB12: 80.0000 mg | ORAL_TABLET | Freq: Four times a day (QID) | ORAL | Status: DC

## 2012-09-17 NOTE — Telephone Encounter (Signed)
Per last phone note, Dr Anne Hahn is authorizing one time early fill.  I called the pharmacy back.  Spoke with [facility].  She transferred me to Jonny Ruiz.  Advised him we are authorizing refill this time only.

## 2012-09-17 NOTE — Telephone Encounter (Signed)
I called patient. The patient apparently has increased his pain medication dosing without my knowledge. The patient only has 4 pills left, and he will be going through withdrawal. The patient must get back on his medications this time, and I will therefore rewrite these prescriptions, but I will not do this again. Acute narcotic withdrawal caries significant medical risk to the patient. I will refer this patient to a pain Center for an evaluation of a possible spinal stimulator for the neuropathy pain, and for possible considerations of a implantable pump for morphine for the neuropathy pain.

## 2012-09-17 NOTE — Telephone Encounter (Signed)
Patient called doctor on call on Sunday at 4:16 PM requesting a refill of his narcotic medications. He stated that he had ( without Dr. Clarisa Kindred knowledge or approval), increased his daily dose  of narcotic medications by 50% , from 80 to 120 mg. I explained to him that narcotics and other scheduled drugs I am not refilled after office hours on weekends or holidays. I also reminded him that he violates the narcotic agreement between him and his prescribing physician. I asked him to speak to Dr. Anne Hahn on Monday and refused to provide any additional scheduled pain medication. Davyn Morandi, MD

## 2012-09-18 ENCOUNTER — Encounter (INDEPENDENT_AMBULATORY_CARE_PROVIDER_SITE_OTHER): Payer: Medicare Other | Admitting: General Surgery

## 2012-09-18 NOTE — Telephone Encounter (Signed)
Rx signed and faxed.

## 2012-10-03 ENCOUNTER — Emergency Department (HOSPITAL_BASED_OUTPATIENT_CLINIC_OR_DEPARTMENT_OTHER)
Admission: EM | Admit: 2012-10-03 | Discharge: 2012-10-03 | Disposition: A | Discharge status: Home / Self Care | Payer: Medicare Other | Attending: Emergency Medicine | Admitting: Emergency Medicine

## 2012-10-03 ENCOUNTER — Encounter (HOSPITAL_BASED_OUTPATIENT_CLINIC_OR_DEPARTMENT_OTHER): Payer: Self-pay

## 2012-10-03 DIAGNOSIS — Z8673 Personal history of transient ischemic attack (TIA), and cerebral infarction without residual deficits: Secondary | ICD-10-CM | POA: Insufficient documentation

## 2012-10-03 DIAGNOSIS — K5900 Constipation, unspecified: Secondary | ICD-10-CM | POA: Insufficient documentation

## 2012-10-03 DIAGNOSIS — I10 Essential (primary) hypertension: Secondary | ICD-10-CM | POA: Insufficient documentation

## 2012-10-03 DIAGNOSIS — Z8679 Personal history of other diseases of the circulatory system: Secondary | ICD-10-CM | POA: Insufficient documentation

## 2012-10-03 DIAGNOSIS — I2510 Atherosclerotic heart disease of native coronary artery without angina pectoris: Secondary | ICD-10-CM | POA: Insufficient documentation

## 2012-10-03 DIAGNOSIS — E119 Type 2 diabetes mellitus without complications: Secondary | ICD-10-CM | POA: Insufficient documentation

## 2012-10-03 DIAGNOSIS — Z8669 Personal history of other diseases of the nervous system and sense organs: Secondary | ICD-10-CM | POA: Insufficient documentation

## 2012-10-03 DIAGNOSIS — M129 Arthropathy, unspecified: Secondary | ICD-10-CM | POA: Insufficient documentation

## 2012-10-03 DIAGNOSIS — Z794 Long term (current) use of insulin: Secondary | ICD-10-CM | POA: Insufficient documentation

## 2012-10-03 DIAGNOSIS — F329 Major depressive disorder, single episode, unspecified: Secondary | ICD-10-CM | POA: Insufficient documentation

## 2012-10-03 DIAGNOSIS — Z9861 Coronary angioplasty status: Secondary | ICD-10-CM | POA: Insufficient documentation

## 2012-10-03 DIAGNOSIS — I252 Old myocardial infarction: Secondary | ICD-10-CM | POA: Insufficient documentation

## 2012-10-03 DIAGNOSIS — Z7982 Long term (current) use of aspirin: Secondary | ICD-10-CM | POA: Insufficient documentation

## 2012-10-03 DIAGNOSIS — Z8719 Personal history of other diseases of the digestive system: Secondary | ICD-10-CM | POA: Insufficient documentation

## 2012-10-03 DIAGNOSIS — Z8739 Personal history of other diseases of the musculoskeletal system and connective tissue: Secondary | ICD-10-CM | POA: Insufficient documentation

## 2012-10-03 DIAGNOSIS — Z79899 Other long term (current) drug therapy: Secondary | ICD-10-CM | POA: Insufficient documentation

## 2012-10-03 DIAGNOSIS — Z8659 Personal history of other mental and behavioral disorders: Secondary | ICD-10-CM | POA: Insufficient documentation

## 2012-10-03 DIAGNOSIS — K6289 Other specified diseases of anus and rectum: Secondary | ICD-10-CM | POA: Insufficient documentation

## 2012-10-03 DIAGNOSIS — Z87891 Personal history of nicotine dependence: Secondary | ICD-10-CM | POA: Insufficient documentation

## 2012-10-03 DIAGNOSIS — E785 Hyperlipidemia, unspecified: Secondary | ICD-10-CM | POA: Insufficient documentation

## 2012-10-03 DIAGNOSIS — F3289 Other specified depressive episodes: Secondary | ICD-10-CM | POA: Insufficient documentation

## 2012-10-03 DIAGNOSIS — Z7902 Long term (current) use of antithrombotics/antiplatelets: Secondary | ICD-10-CM | POA: Insufficient documentation

## 2012-10-03 DIAGNOSIS — K5641 Fecal impaction: Secondary | ICD-10-CM

## 2012-10-03 LAB — BASIC METABOLIC PANEL
BUN: 16 mg/dL (ref 6–23)
CO2: 30 mEq/L (ref 19–32)
Chloride: 96 mEq/L (ref 96–112)
Creatinine, Ser: 1.2 mg/dL (ref 0.50–1.35)

## 2012-10-03 LAB — CBC WITH DIFFERENTIAL/PLATELET
HCT: 42.2 % (ref 39.0–52.0)
Hemoglobin: 14.5 g/dL (ref 13.0–17.0)
Lymphocytes Relative: 13 % (ref 12–46)
Lymphs Abs: 1 10*3/uL (ref 0.7–4.0)
Monocytes Absolute: 0.6 10*3/uL (ref 0.1–1.0)
Monocytes Relative: 7 % (ref 3–12)
Neutro Abs: 6.3 10*3/uL (ref 1.7–7.7)
WBC: 8 10*3/uL (ref 4.0–10.5)

## 2012-10-03 MED ORDER — MAGNESIUM CITRATE PO SOLN
1.0000 | Freq: Once | ORAL | Status: AC
  Administered 2012-10-03: 1 via ORAL
  Filled 2012-10-03: qty 296

## 2012-10-03 MED ORDER — FLEET ENEMA 7-19 GM/118ML RE ENEM
1.0000 | ENEMA | Freq: Once | RECTAL | Status: AC
  Administered 2012-10-03: 1 via RECTAL
  Filled 2012-10-03: qty 1

## 2012-10-03 MED ORDER — ONDANSETRON HCL 4 MG/2ML IJ SOLN
4.0000 mg | Freq: Once | INTRAMUSCULAR | Status: AC
  Administered 2012-10-03: 4 mg via INTRAVENOUS
  Filled 2012-10-03: qty 2

## 2012-10-03 MED ORDER — MORPHINE SULFATE 4 MG/ML IJ SOLN
4.0000 mg | Freq: Once | INTRAMUSCULAR | Status: AC
  Administered 2012-10-03: 4 mg via INTRAVENOUS
  Filled 2012-10-03: qty 1

## 2012-10-03 NOTE — ED Provider Notes (Signed)
Medical screening examination/treatment/procedure(s) were performed by non-physician practitioner and as supervising physician I was immediately available for consultation/collaboration.   Telma Pyeatt, MD 10/03/12 1543 

## 2012-10-03 NOTE — ED Notes (Signed)
Pt reports hx of constipation, states has not had normal bm for about 1 week.  Reports has attempted to use enemas and fecal disimpaction without success.  Also reports has had hyperglycemia.

## 2012-10-03 NOTE — ED Notes (Signed)
PA at bedside.

## 2012-10-03 NOTE — ED Notes (Signed)
Pt attempted to use urinal by [place] per request.  Called out within 30 seconds reporting is unable to relieve Loeb until gets some pain relief.  Keeps requesting pain meds, educated on constipation problems r/t narcotic use.

## 2012-10-03 NOTE — ED Provider Notes (Signed)
CSN: [id number]     Arrival date & time 10/03/12  1231 History   First MD Initiated Contact with Patient 10/03/12 1241     Chief Complaint  Patient presents with  . Fecal Impaction   (Consider location/radiation/quality/duration/timing/severity/associated sxs/prior Treatment) HPI Comments: Patient is a 67 year old male with a past medical history of previous stroke, MS, CAD, and arthritis who presents with a 4 day history of constipation. Patient reports he has been unable to have a bowel movement lately and is now having rectal pain due to a hard stool he cannot get out. Patient has tried stool softeners and enemas which have been unsuccessful. Patient denies any associated symptoms. No aggravating/alleviating factors.    Past Medical History  Diagnosis Date  . PONV (postoperative nausea and vomiting)   . Anginal pain     2003  . Mental disorder   . Depression   . Hypertension     dr Jacky Kindle  . Neuropathy   . MS (multiple sclerosis)   . Coronary artery disease   . Myocardial infarction   . Diabetes mellitus without complication     INSULIN DEPENDENT  . Osteomyelitis   . Neuromuscular disorder      MS & diabetic neuropathy  . Shortness of breath     has to have head elevated.  . Stroke     Left side- a little weakness  . Arthritis   . Dyslipidemia   . Gait disorder    Past Surgical History  Procedure Laterality Date  . Cardiac catheterization      2005  . Fracture surgery      rt foot  . Orif ankle fracture  12/30/2011    Procedure: OPEN REDUCTION INTERNAL FIXATION (ORIF) ANKLE FRACTURE;  Surgeon: Nadara Mustard, MD;  Location: [facility] OR;  Service: Orthopedics;  Laterality: Left;  Excision Bone, Abscess Left Charcot Foot, Place Antibiotic Beads, Fixation Charcot  . 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 Coronary angioplasty      cardiac stent  . Rectal  abscess    . Hardware removal Left 04/20/2012    Procedure: Left foot removal deep retained screw;  Surgeon: Nadara Mustard, MD;  Location: [facility] OR;  Service: Orthopedics;  Laterality: Left;   Family History  Problem Relation Age of Onset  . Tremor Mother   . Heart disease Father   . Celiac disease Brother   . Cancer - Prostate Paternal Grandfather    History  Substance Use Topics  . Smoking status: Former Smoker -- 7 years    Types: Cigars  . Smokeless tobacco: Never Used  . Alcohol Use: No    Review of Systems  Gastrointestinal: Positive for constipation and rectal pain.  All other systems reviewed and are negative.    Allergies  Review of patient's allergies indicates no known allergies.  Home Medications   Current Outpatient Rx  Name  Route  Sig  Dispense  Refill  . amLODipine (NORVASC) 10 MG tablet   Oral   Take 10 mg by mouth daily.         Marland Kitchen aspirin 81 MG tablet   Oral   Take 81 mg by mouth daily.         Marland Kitchen atorvastatin (LIPITOR) 10 MG tablet   Oral   Take 10 mg by mouth daily.         . clopidogrel (PLAVIX) 75 MG tablet   Oral   Take 75 mg by  mouth daily.         . finasteride (PROSCAR) 5 MG tablet   Oral   Take 5 mg by mouth 2 (two) times daily.         Marland Kitchen HYDROcodone-acetaminophen (NORCO) 10-325 MG per tablet      TAKE 1 TABLET BY MOUTH 5 TIMES A DAY... MUST LAST 28 DAYS   150 tablet   2     Pharmacy Fax [phone]   . insulin aspart (NOVOLOG) 100 UNIT/ML injection   Subcutaneous   Inject 5-10 Units into the skin daily as needed. Per sliding scale 200=5units, 400=10 units. Takes only when he needs it per patient.         . insulin NPH (HUMULIN N,NOVOLIN N) 100 UNIT/ML injection   Subcutaneous   Inject 17-42 Units into the skin 2 (two) times daily. 42units in AM, and 17 in the evening         . metoCLOPramide (REGLAN) 5 MG tablet      TAKE 1 TABLET (5 MG TOTAL) BY MOUTH EVERY 6 (SIX) HOURS AS NEEDED.   120 tablet   3   . metoprolol tartrate (LOPRESSOR) 25 MG tablet   Oral   Take 25 mg by mouth 2 (two) times daily.         . Multiple  Vitamin (MULTIVITAMIN WITH MINERALS) TABS   Oral   Take 1 tablet by mouth daily.         . nitroGLYCERIN (NITROSTAT) 0.4 MG SL tablet   Sublingual   Place 0.4 mg under the tongue every 5 (five) minutes as needed. For chest pain         . oxyCODONE (OXYCONTIN) 40 MG 12 hr tablet   Oral   Take 1 tablet (40 mg total) by mouth every 12 (twelve) hours.   60 tablet   0   . oxyCODONE (OXYCONTIN) 80 MG 12 hr tablet   Oral   Take 1 tablet (80 mg total) by mouth every 6 (six) hours.   120 tablet   0   . traZODone (DESYREL) 150 MG tablet   Oral   Take 150 mg by mouth at bedtime.          BP 163/58  Pulse 82  Temp(Src) 97.5 F (36.4 C) (Oral)  Resp 21  Ht 6' (1.829 m)  Wt 197 lb (89.359 kg)  BMI 26.71 kg/m2  SpO2 95% Physical Exam  Nursing note and vitals reviewed. Constitutional: He is oriented to person, place, and time. He appears well-developed and well-nourished. No distress.  HENT:  Head: Normocephalic and atraumatic.  Eyes: Conjunctivae and EOM are normal.  Neck: Normal range of motion.  Cardiovascular: Normal rate and regular rhythm.  Exam reveals no gallop and no friction rub.   No murmur heard. Pulmonary/Chest: Effort normal and breath sounds normal. He has no wheezes. He has no rales. He exhibits no tenderness.  Abdominal: Soft. He exhibits no distension. There is no tenderness. There is no rebound and no guarding.  Genitourinary:  Hard stool palpated in rectum. No fissures or external hemorrhoids noted.   Musculoskeletal: Normal range of motion.  Neurological: He is alert and oriented to person, place, and time. Coordination normal.  Speech is goal-oriented. Moves limbs without ataxia.   Skin: Skin is warm and dry.  Psychiatric: He has a normal mood and affect. His behavior is normal.    ED Course  Procedures (including critical care time) Labs Review Labs Reviewed  CBC WITH DIFFERENTIAL -  Abnormal; Notable for the following:    Neutrophils Relative %  79 (*)    All other components within normal limits  BASIC METABOLIC PANEL - Abnormal; Notable for the following:    Glucose, Bld 387 (*)    GFR calc non Af Amer 62 (*)    GFR calc Af Amer 72 (*)    All other components within normal limits  URINE CULTURE  URINALYSIS, ROUTINE W REFLEX MICROSCOPIC   Imaging Review No results found.  MDM   1. Fecal impaction     1:12 PM Labs pending. I digitally disimpacted the patient as much as I could. Patient will now have a fleet enema for remaining bowel impaction.   3:28 PM Patient was able to have a sufficient bowel movement here. Patient is ready to be discharged. Vitals stable and patient afebrile. [name] labs unremarkable for acute changes. Patient instructed to follow up with his doctor and return to the ED with worsening or concerning symptoms.     Emilia Beck, PA-C 10/03/12 1531

## 2012-10-03 NOTE — ED Notes (Signed)
Pt with moderate sized hard stool expelled.  Perineal area cleaned.

## 2012-10-03 NOTE — ED Notes (Signed)
Partial disimpaction per pa.  Pt then sat on bedside commode.  Called out within 1 minute to ask if can be placed back in bed b/c 'its not working.'  Requesting enema to assist with stooling.  Linens changed d/t soiling.

## 2012-10-03 NOTE — ED Notes (Signed)
MD at bedside. 

## 2012-10-15 ENCOUNTER — Other Ambulatory Visit: Payer: Self-pay

## 2012-10-15 DIAGNOSIS — R079 Chest pain, unspecified: Secondary | ICD-10-CM

## 2012-10-15 MED ORDER — OXYCODONE HCL 40 MG PO TB12: 40.0000 mg | ORAL_TABLET | Freq: Two times a day (BID) | ORAL | Status: DC

## 2012-10-15 MED ORDER — OXYCODONE HCL 80 MG PO TB12: 80.0000 mg | ORAL_TABLET | Freq: Four times a day (QID) | ORAL | Status: DC

## 2012-10-15 NOTE — Telephone Encounter (Signed)
Patient called requesting refills on both Oxycontin Rx's.  He would like to pick them up when they are ready.  Call back number [phone].

## 2012-11-12 ENCOUNTER — Other Ambulatory Visit: Payer: Self-pay

## 2012-11-12 DIAGNOSIS — R079 Chest pain, unspecified: Secondary | ICD-10-CM

## 2012-11-12 MED ORDER — HYDROCODONE-ACETAMINOPHEN 10-325 MG PO TABS: 1.0000 | ORAL_TABLET | Freq: Every day | ORAL | Status: DC

## 2012-11-12 MED ORDER — OXYCODONE HCL ER 40 MG PO T12A: 40.0000 mg | EXTENDED_RELEASE_TABLET | Freq: Two times a day (BID) | ORAL | Status: DC

## 2012-11-12 MED ORDER — OXYCODONE HCL ER 80 MG PO T12A: 80.0000 mg | EXTENDED_RELEASE_TABLET | Freq: Four times a day (QID) | ORAL | Status: DC

## 2012-11-12 NOTE — Telephone Encounter (Signed)
Patient called requesting refills on pain meds.  He would like to pick up the Rx's when they are ready.  Call back number [phone]

## 2012-11-13 ENCOUNTER — Other Ambulatory Visit: Payer: Self-pay | Admitting: *Deleted

## 2012-12-03 ENCOUNTER — Encounter: Payer: Self-pay | Admitting: Neurology

## 2012-12-03 ENCOUNTER — Encounter (INDEPENDENT_AMBULATORY_CARE_PROVIDER_SITE_OTHER): Payer: Self-pay

## 2012-12-03 ENCOUNTER — Ambulatory Visit (INDEPENDENT_AMBULATORY_CARE_PROVIDER_SITE_OTHER): Payer: Medicare Other | Admitting: Neurology

## 2012-12-03 VITALS — BP 182/83 | HR 80 | Wt 191.0 lb

## 2012-12-03 DIAGNOSIS — G35 Multiple sclerosis: Secondary | ICD-10-CM

## 2012-12-03 DIAGNOSIS — E1142 Type 2 diabetes mellitus with diabetic polyneuropathy: Secondary | ICD-10-CM

## 2012-12-03 MED ORDER — MORPHINE SULFATE ER 100 MG PO TBCR: 200.0000 mg | EXTENDED_RELEASE_TABLET | Freq: Four times a day (QID) | ORAL | Status: DC

## 2012-12-03 NOTE — Progress Notes (Signed)
Reason for visit: Peripheral neuropathy  [name] is an 67 y.o. male  History of present illness:  [name] is a 67 year old right-handed white male with a history of diabetes, diabetic peripheral neuropathy, cerebrovascular disease, prior history of multiple sclerosis, and a gait disorder. The patient has had increased discomfort and pain with his diabetic neuropathy. The patient is using a walker for ambulation, and he denies any falls since last seen. The patient is having problems affording the OxyContin for his pain on Medicare. The patient is not sleeping well, and he has to remain up on his feet, walking at times to alleviate some of the pain. The patient returns for an evaluation. The patient is interested in potentially switching to morphine to reduce the cost of the pain management.  Past Medical History  Diagnosis Date  . PONV (postoperative nausea and vomiting)   . Anginal pain     2003  . Mental disorder   . Depression   . Hypertension     dr Jacky Kindle  . Neuropathy   . MS (multiple sclerosis)   . Coronary artery disease   . Myocardial infarction   . Diabetes mellitus without complication     INSULIN DEPENDENT  . Osteomyelitis   . Neuromuscular disorder      MS & diabetic neuropathy  . Shortness of breath     has to have head elevated.  . Stroke     Left side- a little weakness  . Arthritis   . Dyslipidemia   . Gait disorder     Past Surgical History  Procedure Laterality Date  . Cardiac catheterization      2005  . Fracture surgery      rt foot  . Orif ankle fracture  12/30/2011    Procedure: OPEN REDUCTION INTERNAL FIXATION (ORIF) ANKLE FRACTURE;  Surgeon: Nadara Mustard, MD;  Location: [facility] OR;  Service: Orthopedics;  Laterality: Left;  Excision Bone, Abscess Left Charcot Foot, Place Antibiotic Beads, Fixation Charcot  . 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 Coronary angioplasty      cardiac stent  . Rectal  abscess    . Hardware removal Left  04/20/2012    Procedure: Left foot removal deep retained screw;  Surgeon: Nadara Mustard, MD;  Location: [facility] OR;  Service: Orthopedics;  Laterality: Left;    Family History  Problem Relation Age of Onset  . Tremor Mother   . Heart disease Father   . Celiac disease Brother   . Cancer - Prostate Paternal Grandfather     Social history:  reports that he has quit smoking. His smoking use included Cigars. He has never used smokeless tobacco. He reports that he does not drink alcohol or use illicit drugs.   No Known Allergies  Medications:  Current Outpatient Prescriptions on File Prior to Visit  Medication Sig Dispense Refill  . amLODipine (NORVASC) 10 MG tablet Take 10 mg by mouth daily.      Marland Kitchen aspirin 81 MG tablet Take 81 mg by mouth daily.      Marland Kitchen atorvastatin (LIPITOR) 10 MG tablet Take 10 mg by mouth daily.      . clopidogrel (PLAVIX) 75 MG tablet Take 75 mg by mouth daily.      . finasteride (PROSCAR) 5 MG tablet Take 5 mg by mouth 2 (two) times daily.      Marland Kitchen HYDROcodone-acetaminophen (NORCO) 10-325 MG per tablet Take 1 tablet by mouth 5 (five) times  daily. MUST LAST 28 DAYS  150 tablet  0  . insulin aspart (NOVOLOG) 100 UNIT/ML injection Inject 5-10 Units into the skin daily as needed. Per sliding scale 200=5units, 400=10 units. Takes only when he needs it per patient.      . insulin NPH (HUMULIN N,NOVOLIN N) 100 UNIT/ML injection Inject 17-42 Units into the skin 2 (two) times daily. 42units in AM, and 17 in the evening      . metoCLOPramide (REGLAN) 5 MG tablet TAKE 1 TABLET (5 MG TOTAL) BY MOUTH EVERY 6 (SIX) HOURS AS NEEDED.  120 tablet  3  . metoprolol tartrate (LOPRESSOR) 25 MG tablet Take 25 mg by mouth 2 (two) times daily.      . nitroGLYCERIN (NITROSTAT) 0.4 MG SL tablet Place 0.4 mg under the tongue every 5 (five) minutes as needed. For chest pain      . traZODone (DESYREL) 150 MG tablet Take 150 mg by mouth at bedtime.       No current facility-administered medications on file  prior to visit.    ROS:  Out of a complete 14 system review of symptoms, the patient complains only of the following symptoms, and all other reviewed systems are negative.  Blurred vision Constipation Urination problems Easy bruising Achy muscles, skin sensitivity Numbness, weakness, tremor Decreased energy Insomnia  Blood pressure 182/83, pulse 80, weight 191 lb (86.637 kg).  Physical Exam  General: The patient is alert and cooperative at the time of the examination.  Skin: No significant peripheral edema is noted.   Neurologic Exam  Mental status: The patient is oriented x 3.  Cranial nerves: Facial symmetry is present. Speech is normal, no aphasia or dysarthria is noted. Extraocular movements are full. Visual fields are full.  Motor: The patient has good strength in all 4 extremities, with exception of bilateral foot drops.  Sensory examination: Soft touch sensation on the face, arms, and legs is symmetric.  Coordination: The patient has mild dysmetria on the left arm and leg.  Gait and station: The patient has a wide-based, unsteady gait. Tandem gait was not attempted. Romberg is positive. No drift is seen.  Reflexes: Deep tendon reflexes are symmetric, but are depressed to absent.   Assessment/Plan:  1. Diabetic peripheral neuropathy  2. Chronic pain syndrome  3. Gait disorder  4. History of multiple sclerosis  The patient will be changed to MS Contin from OxyContin to help with the cost of the pain management. The patient will be set up for a referral to a pain center to consider the possibility of a spinal stimulator, or even a morphine pump to help pain in the future. The patient will followup in 6 months. The patient was given a prescription for the MS Contin.  Marlan Palau MD 12/03/2012 7:45 PM  [facility] [address] [place], [place] [address]  Phone [phone] Fax [phone]

## 2012-12-06 ENCOUNTER — Telehealth: Payer: Self-pay | Admitting: *Deleted

## 2012-12-06 ENCOUNTER — Other Ambulatory Visit: Payer: Self-pay | Admitting: Neurology

## 2012-12-06 MED ORDER — OXYCODONE HCL ER 40 MG PO T12A: 40.0000 mg | EXTENDED_RELEASE_TABLET | Freq: Two times a day (BID) | ORAL | Status: DC

## 2012-12-06 MED ORDER — MORPHINE SULFATE 30 MG PO TABS: 60.0000 mg | ORAL_TABLET | ORAL | Status: DC | PRN

## 2012-12-06 MED ORDER — OXYCODONE HCL ER 80 MG PO T12A: 80.0000 mg | EXTENDED_RELEASE_TABLET | Freq: Four times a day (QID) | ORAL | Status: DC

## 2012-12-06 NOTE — Telephone Encounter (Signed)
I spoke with wife and [name] at [facility] pharmacy in [place]. The insurance company will only cover 3 tablets of the morphine. The pharmacist has the script and will void it and fax it to [place]. Patient needs a new script for oxycontin.  Please address. Wife waiting for script. Patient will be out of medication this evening.   Voided script has arrive in our office.

## 2012-12-06 NOTE — Telephone Encounter (Signed)
I talked with the wife. Apparently, the insurance will not cover the MS Contin. I will go back to the OxyContin original prescription. A prescription was written for short acting morphine tablets, 30 mg, but this prescription was never given to the patient.

## 2012-12-20 ENCOUNTER — Other Ambulatory Visit: Payer: Self-pay | Admitting: Neurology

## 2013-01-01 ENCOUNTER — Other Ambulatory Visit: Payer: Self-pay

## 2013-01-01 ENCOUNTER — Telehealth: Payer: Self-pay | Admitting: Neurology

## 2013-01-01 MED ORDER — OXYCODONE HCL ER 80 MG PO T12A: 80.0000 mg | EXTENDED_RELEASE_TABLET | Freq: Four times a day (QID) | ORAL | Status: DC

## 2013-01-01 MED ORDER — OXYCODONE HCL ER 40 MG PO T12A: 40.0000 mg | EXTENDED_RELEASE_TABLET | Freq: Two times a day (BID) | ORAL | Status: DC

## 2013-01-01 NOTE — Telephone Encounter (Signed)
I notified patient that 2 Rx ready for pick up at front desk

## 2013-01-01 NOTE — Telephone Encounter (Signed)
Patient called requesting refills on both pain meds.  He would like to pick up the Rx's when they are ready.  Requesting a couple days early due to the Thanksgiving Holiday.  Call back number [phone].

## 2013-01-01 NOTE — Telephone Encounter (Signed)
Patient takes both strengths of Oxycontin.  Although he was changed to MS Contin at last OV, this was not covered through his ins plan, and he was changed back to Oxy.  I called and spoke with Judeth Cornfield.  She said the patient went to the the parmacy and paid out of pocket for his Oxy on 08/30/2012 and again on 09/17/2012.  (Phone note from 08/11 says the patient increased his pain meds without our knowledge and Dr Anne Hahn authorized a one time refill to avoid withdrawls).  The ins would not pay for his refill, at those times, as it was too soon.  The patient now wants ins to reimburse him for paying out of pocket. I verified the Rx doses with Judeth Cornfield, and made her aware of the one time refill auth on 08/11 per phone note.  She said she will forward this to the appropriate dept to determine if they will reimburse the patient or not.  They will contact the patient directly regarding this matter.  I called the patient.  Got no answer.  Left message saying we have contacted [employer] and they are working on his request.

## 2013-01-01 NOTE — Telephone Encounter (Signed)
Judeth Cornfield from [facility] was calling to be sure that patient should be getting both oxycontin (40 mg and 80 mg) T12A 12 hour tablets for a total of 120 mg Q12 hours and if he should be filling it early. I review of the record, it also indicates that patient was switched to MS contin at October 2014 OV. In addition, patient requested Hydrocodone on 12/06/2012, due to insurance not covering.  Please clarify for patient [phone] Rexene Edison) and Judeth Cornfield at [facility] [phone])   Thank you.

## 2013-01-02 ENCOUNTER — Telehealth: Payer: Self-pay | Admitting: Neurology

## 2013-01-02 ENCOUNTER — Telehealth: Payer: Self-pay | Admitting: *Deleted

## 2013-01-02 NOTE — Telephone Encounter (Signed)
I called and spoke with wife to clarify, since we have so many messages. She states that she can not fill pt rx until tomorrow. Can Dr. Anne Hahn prescribe a non-narcotic to get him through the night?   I advised wife that patient really needs to follow up with pain clinic. She said they never contacted the family.  I asked if she had a pen and paper. She did. I gave her the phone number and told her that if she hasn't heard from them then it is up to the family to contact them. [facility] [phone]). I said that his pain can better be managed through them and it is not in his interest to delay. She said she would probably have to wait until Monday to call. I told her to call today.   I will speak with Dr. Anne Hahn and get back to her.

## 2013-01-02 NOTE — Telephone Encounter (Signed)
I called patient. The patient has run out of his oxycodone because he took too many of the medication. The patient will get the medication tomorrow, I'll call in Ultram tonight to get him through with the pain problem. I gave him the 50 mg Ultram tablets, 30 tablets were given, no refills. This is a one-time prescription only.

## 2013-01-08 ENCOUNTER — Other Ambulatory Visit: Payer: Self-pay

## 2013-01-08 MED ORDER — HYDROCODONE-ACETAMINOPHEN 10-325 MG PO TABS: 1.0000 | ORAL_TABLET | Freq: Every day | ORAL | Status: DC

## 2013-01-08 NOTE — Telephone Encounter (Signed)
Patient called requesting a refill on Hydrocodone.  He would like to pick up the Rx when it's ready.  Call back number [phone].

## 2013-01-08 NOTE — Telephone Encounter (Signed)
I called and spoke with patient to let him know his Rx for norco would be available after 2 p.m. Today for pick up.

## 2013-01-29 ENCOUNTER — Telehealth: Payer: Self-pay

## 2013-01-29 MED ORDER — OXYCODONE HCL ER 40 MG PO T12A: 40.0000 mg | EXTENDED_RELEASE_TABLET | Freq: Two times a day (BID) | ORAL | Status: DC

## 2013-01-29 MED ORDER — OXYCODONE HCL ER 80 MG PO T12A: 80.0000 mg | EXTENDED_RELEASE_TABLET | Freq: Four times a day (QID) | ORAL | Status: DC

## 2013-01-29 NOTE — Telephone Encounter (Signed)
The OxyContin can be filled now, unfortunately all restrictions must come to the computer for documentation purposes. The hydrocodone cannot be written on a paper prescription and postdated. He will have to come back to the office in early January to pick up the hydrocodone prescription.

## 2013-01-29 NOTE — Telephone Encounter (Signed)
Patient called requesting a refill on both Oxycontin Rx's.  As well he'd like the Hydrocodone Rx written.  The hydrocodone was last written on 12/02. I spoke with the patient and he says he is not out of the medication yet, was just asking to get all Rx's at the same time due to the upcoming holidays and also to keep from making multiple trips to the office.

## 2013-01-29 NOTE — Telephone Encounter (Signed)
I called patient and notified him his Rxs are ready to be picked up.

## 2013-02-11 ENCOUNTER — Other Ambulatory Visit: Payer: Self-pay | Admitting: *Deleted

## 2013-02-11 MED ORDER — HYDROCODONE-ACETAMINOPHEN 10-325 MG PO TABS: 1.0000 | ORAL_TABLET | Freq: Every day | ORAL | Status: DC

## 2013-02-12 ENCOUNTER — Telehealth: Payer: Self-pay | Admitting: Neurology

## 2013-02-12 NOTE — Telephone Encounter (Signed)
I called patient to let him know that Rx would be ready to be picked up after noon today. He will have his wife pick up. I also let him know that his insurance has authorized him for appt with pain clinic. Patient states that they have an appt scheduled with pain clinic. He thinks it is February 26, 2013 but, is not completely sure. I will leaver the name and number for the pain clinic with the Rx for pick up.

## 2013-02-12 NOTE — Telephone Encounter (Signed)
I spoke with the patient.  He apparently tried to call originally for refill when our office was closed for the holiday.  I apologized and explained we were closed for the holiday. Patient called back yesterday to request refill.  Advised Rx was processed yesterday, and the nurse should be calling shortly letting him know it's ready for pick up.  Patient says he would like to get all pain meds back on the same schedule so wife does not have to make two trips to our office monthly to get Rx's.  Says the meds got off track when he was in more pain and took extra pills.  I explained the importance of taking meds as prescribed. Patient verbalized understanding.

## 2013-02-12 NOTE — Telephone Encounter (Signed)
I called patient. Due to the holiday schedule, the patient was 3 days late getting his hydrocodone. The patient is "out of phase" with the OxyContin and hydrocodone. On the next hydrocodone prescription filled, I will write a prescription for one half of the usual tablets, and then he will be back and phase with all of his pain medications. He is to remind [place] about this when he calls index for the hydrocodone.

## 2013-02-12 NOTE — Telephone Encounter (Signed)
Patient requesting refill of hydrocodone, says he ran out of script on 02/08/13 and is upset that no one has called him back about this. Please call the patient, he would like to speak with someone about this.

## 2013-02-27 ENCOUNTER — Other Ambulatory Visit: Payer: Self-pay

## 2013-02-27 MED ORDER — OXYCODONE HCL ER 40 MG PO T12A: 40.0000 mg | EXTENDED_RELEASE_TABLET | Freq: Two times a day (BID) | ORAL | Status: DC

## 2013-02-27 MED ORDER — OXYCODONE HCL ER 80 MG PO T12A: 80.0000 mg | EXTENDED_RELEASE_TABLET | Freq: Four times a day (QID) | ORAL | Status: DC

## 2013-02-27 NOTE — Telephone Encounter (Signed)
Patient called requesting refills on both Oxycontin Rx's.  Previous phone note says he has an appt at the pain clinic in Jan, but was not certain of the date.  I called the patient back to see if he has been to the pain clinic, got no answer.  Would you like to refill the Rx's?  If approved, the patient would like to pick up the Rx's.  Call back number with any questions is [phone].  Thank you.

## 2013-02-28 NOTE — Telephone Encounter (Signed)
Called patient to inform him that his Rx was ready to be picked up at the front desk and if he has any other problems, questions or concerns to call the office. Patient verbalized understanding.

## 2013-03-13 ENCOUNTER — Other Ambulatory Visit: Payer: Self-pay | Admitting: Neurology

## 2013-03-13 MED ORDER — HYDROCODONE-ACETAMINOPHEN 10-325 MG PO TABS: 1.0000 | ORAL_TABLET | Freq: Every day | ORAL | Status: DC

## 2013-03-13 NOTE — Telephone Encounter (Signed)
Last phone note says the patient wanted to get his pain meds back on the same schedule.  Dr Jannifer Franklin indicated he could write a Rx for one half of the Hydrocodone tabs normally prescribed when the refill is needed, then they should all be back on the same track, but he asked the patient to remind [place] of this when he called for refill.  I called the patient back to verify this is what he would like to do.  Spoke with Ms Membreno.  She said they would like to proceed with getting a smaller quantity Rx this time so they can get back on track.  Says they did go to the pain clinic, and the OV lasted 3 hours.  She indicates the MD there has requested medical records from all providers, and will not be prescribing pain meds until he has that info and schedules a follow up appt so they can devise a plan.  Patient usually gets his Rx for #150.  It has been entered as #75, for half the original prescribed quantity.

## 2013-03-13 NOTE — Telephone Encounter (Signed)
Called patient and spoke with patient's wife carolyn concerning patient's Rx being ready to be picked up at the front desk and if he has any other problems, questions or concerns to call the office. [name] wife verbalized understanding.

## 2013-03-13 NOTE — Telephone Encounter (Signed)
Pt called in requesting a refill on his hydrocodone prescription.  He asked if he could be called as soon as it is ready.  Thank you

## 2013-03-27 ENCOUNTER — Other Ambulatory Visit: Payer: Self-pay | Admitting: Neurology

## 2013-03-27 MED ORDER — OXYCODONE HCL ER 40 MG PO T12A: 40.0000 mg | EXTENDED_RELEASE_TABLET | Freq: Two times a day (BID) | ORAL | Status: DC

## 2013-03-27 MED ORDER — HYDROCODONE-ACETAMINOPHEN 10-325 MG PO TABS: 1.0000 | ORAL_TABLET | Freq: Every day | ORAL | Status: DC

## 2013-03-27 MED ORDER — OXYCODONE HCL ER 80 MG PO T12A: 80.0000 mg | EXTENDED_RELEASE_TABLET | Freq: Four times a day (QID) | ORAL | Status: DC

## 2013-03-27 NOTE — Telephone Encounter (Signed)
Patient calling to request oxycontin and hydrocodone refill since he is completely out.

## 2013-03-27 NOTE — Telephone Encounter (Signed)
Called patient to inform him that his Rx was ready to be picked up at the front desk and if he has any other problems, questions or concerns to call the office. Patient verbalized understanding.

## 2013-04-11 ENCOUNTER — Other Ambulatory Visit: Payer: Self-pay | Admitting: Neurology

## 2013-04-19 ENCOUNTER — Telehealth: Payer: Self-pay | Admitting: Neurology

## 2013-04-19 NOTE — Telephone Encounter (Signed)
Pt called is needing to get is written prescription for HYDROcodone-acetaminophen (NORCO) 10-325 MG per tablet, OxyCODONE (OXYCONTIN) 40 mg T12A 12 hr tablet &  OxyCODONE (OXYCONTIN) 80 mg T12A 12 hr tablet. Please call pt when this is ready for pick up.

## 2013-04-19 NOTE — Telephone Encounter (Signed)
These Rx's were written for 30 day supplies on 02/18.  It is a few days too soon.  I called the patent back.  Advised it is too soon to fill Rx's at this time.  He verbalized understanding.

## 2013-04-20 ENCOUNTER — Emergency Department (HOSPITAL_COMMUNITY): Payer: Medicare Other

## 2013-04-20 ENCOUNTER — Inpatient Hospital Stay (HOSPITAL_COMMUNITY)
Admission: EM | Admit: 2013-04-20 | Discharge: 2013-04-22 | DRG: 897 | Disposition: A | Discharge status: Home Health | Payer: Medicare Other | Attending: Internal Medicine | Admitting: Internal Medicine

## 2013-04-20 ENCOUNTER — Encounter (HOSPITAL_COMMUNITY): Payer: Self-pay | Admitting: Emergency Medicine

## 2013-04-20 DIAGNOSIS — E7800 Pure hypercholesterolemia, unspecified: Secondary | ICD-10-CM

## 2013-04-20 DIAGNOSIS — R29898 Other symptoms and signs involving the musculoskeletal system: Secondary | ICD-10-CM | POA: Diagnosis present

## 2013-04-20 DIAGNOSIS — F19939 Other psychoactive substance use, unspecified with withdrawal, unspecified: Principal | ICD-10-CM | POA: Diagnosis present

## 2013-04-20 DIAGNOSIS — Z794 Long term (current) use of insulin: Secondary | ICD-10-CM

## 2013-04-20 DIAGNOSIS — E108 Type 1 diabetes mellitus with unspecified complications: Secondary | ICD-10-CM | POA: Diagnosis present

## 2013-04-20 DIAGNOSIS — Z7902 Long term (current) use of antithrombotics/antiplatelets: Secondary | ICD-10-CM

## 2013-04-20 DIAGNOSIS — E1142 Type 2 diabetes mellitus with diabetic polyneuropathy: Secondary | ICD-10-CM | POA: Diagnosis present

## 2013-04-20 DIAGNOSIS — I252 Old myocardial infarction: Secondary | ICD-10-CM

## 2013-04-20 DIAGNOSIS — R269 Unspecified abnormalities of gait and mobility: Secondary | ICD-10-CM

## 2013-04-20 DIAGNOSIS — I6990 Unspecified sequelae of unspecified cerebrovascular disease: Secondary | ICD-10-CM

## 2013-04-20 DIAGNOSIS — I69998 Other sequelae following unspecified cerebrovascular disease: Secondary | ICD-10-CM

## 2013-04-20 DIAGNOSIS — F329 Major depressive disorder, single episode, unspecified: Secondary | ICD-10-CM | POA: Diagnosis present

## 2013-04-20 DIAGNOSIS — I2510 Atherosclerotic heart disease of native coronary artery without angina pectoris: Secondary | ICD-10-CM | POA: Diagnosis present

## 2013-04-20 DIAGNOSIS — Z79899 Other long term (current) drug therapy: Secondary | ICD-10-CM

## 2013-04-20 DIAGNOSIS — I10 Essential (primary) hypertension: Secondary | ICD-10-CM | POA: Diagnosis present

## 2013-04-20 DIAGNOSIS — R0789 Other chest pain: Secondary | ICD-10-CM | POA: Diagnosis present

## 2013-04-20 DIAGNOSIS — M129 Arthropathy, unspecified: Secondary | ICD-10-CM | POA: Diagnosis present

## 2013-04-20 DIAGNOSIS — R079 Chest pain, unspecified: Secondary | ICD-10-CM | POA: Diagnosis present

## 2013-04-20 DIAGNOSIS — F1193 Opioid use, unspecified with withdrawal: Secondary | ICD-10-CM

## 2013-04-20 DIAGNOSIS — G35 Multiple sclerosis: Secondary | ICD-10-CM | POA: Diagnosis present

## 2013-04-20 DIAGNOSIS — Z87891 Personal history of nicotine dependence: Secondary | ICD-10-CM

## 2013-04-20 DIAGNOSIS — E119 Type 2 diabetes mellitus without complications: Secondary | ICD-10-CM

## 2013-04-20 DIAGNOSIS — E785 Hyperlipidemia, unspecified: Secondary | ICD-10-CM | POA: Diagnosis present

## 2013-04-20 DIAGNOSIS — F1120 Opioid dependence, uncomplicated: Secondary | ICD-10-CM | POA: Diagnosis present

## 2013-04-20 DIAGNOSIS — Z9861 Coronary angioplasty status: Secondary | ICD-10-CM

## 2013-04-20 DIAGNOSIS — E1149 Type 2 diabetes mellitus with other diabetic neurological complication: Secondary | ICD-10-CM | POA: Diagnosis present

## 2013-04-20 DIAGNOSIS — F3289 Other specified depressive episodes: Secondary | ICD-10-CM | POA: Diagnosis present

## 2013-04-20 DIAGNOSIS — F1123 Opioid dependence with withdrawal: Secondary | ICD-10-CM

## 2013-04-20 LAB — GLUCOSE, CAPILLARY
GLUCOSE-CAPILLARY: 175 mg/dL — AB (ref 70–99)
Glucose-Capillary: 247 mg/dL — ABNORMAL HIGH (ref 70–99)

## 2013-04-20 LAB — CBC WITH DIFFERENTIAL/PLATELET
BASOS ABS: 0 10*3/uL (ref 0.0–0.1)
Basophils Relative: 0 % (ref 0–1)
EOS ABS: 0.1 10*3/uL (ref 0.0–0.7)
EOS PCT: 2 % (ref 0–5)
HEMATOCRIT: 39.5 % (ref 39.0–52.0)
Hemoglobin: 14 g/dL (ref 13.0–17.0)
LYMPHS PCT: 22 % (ref 12–46)
Lymphs Abs: 1.5 10*3/uL (ref 0.7–4.0)
MCH: 33.2 pg (ref 26.0–34.0)
MCHC: 35.4 g/dL (ref 30.0–36.0)
MCV: 93.6 fL (ref 78.0–100.0)
MONO ABS: 0.6 10*3/uL (ref 0.1–1.0)
Monocytes Relative: 9 % (ref 3–12)
Neutro Abs: 4.3 10*3/uL (ref 1.7–7.7)
Neutrophils Relative %: 67 % (ref 43–77)
PLATELETS: 328 10*3/uL (ref 150–400)
RBC: 4.22 MIL/uL (ref 4.22–5.81)
RDW: 12.9 % (ref 11.5–15.5)
WBC: 6.5 10*3/uL (ref 4.0–10.5)

## 2013-04-20 LAB — I-STAT TROPONIN, ED: Troponin i, poc: 0.01 ng/mL (ref 0.00–0.08)

## 2013-04-20 LAB — CK TOTAL AND CKMB (NOT AT ARMC)
CK, MB: 1.1 ng/mL (ref 0.3–4.0)
RELATIVE INDEX: INVALID (ref 0.0–2.5)
Total CK: 30 U/L (ref 7–232)

## 2013-04-20 LAB — BASIC METABOLIC PANEL
BUN: 11 mg/dL (ref 6–23)
CALCIUM: 8.9 mg/dL (ref 8.4–10.5)
CO2: 27 meq/L (ref 19–32)
CREATININE: 0.95 mg/dL (ref 0.50–1.35)
Chloride: 97 mEq/L (ref 96–112)
GFR calc Af Amer: >90 mL/min (ref >90)
GFR, EST NON AFRICAN AMERICAN: 85 mL/min — AB (ref >90)
GLUCOSE: 217 mg/dL — AB (ref 70–99)
Potassium: 4 mEq/L (ref 3.7–5.3)
SODIUM: 136 meq/L — AB (ref 137–147)

## 2013-04-20 LAB — TROPONIN I: Troponin I: <0.3 ng/mL (ref <0.30)

## 2013-04-20 MED ORDER — MORPHINE SULFATE 10 MG/ML IJ SOLN
10.0000 mg | Freq: Once | INTRAMUSCULAR | Status: AC
  Administered 2013-04-20: 10 mg via INTRAVENOUS
  Filled 2013-04-20: qty 1

## 2013-04-20 MED ORDER — ALPRAZOLAM 0.25 MG PO TABS
0.2500 mg | ORAL_TABLET | Freq: Three times a day (TID) | ORAL | Status: DC | PRN
  Administered 2013-04-20 – 2013-04-22 (×5): 0.25 mg via ORAL
  Filled 2013-04-20 (×5): qty 1

## 2013-04-20 MED ORDER — TRAZODONE HCL 150 MG PO TABS
150.0000 mg | ORAL_TABLET | Freq: Every day | ORAL | Status: DC
  Administered 2013-04-20 – 2013-04-21 (×2): 150 mg via ORAL
  Filled 2013-04-20 (×3): qty 1

## 2013-04-20 MED ORDER — ASPIRIN EC 81 MG PO TBEC
81.0000 mg | DELAYED_RELEASE_TABLET | Freq: Every day | ORAL | Status: DC
Start: 2013-04-20 — End: 2013-04-22
  Administered 2013-04-20 – 2013-04-22 (×3): 81 mg via ORAL
  Filled 2013-04-20 (×3): qty 1

## 2013-04-20 MED ORDER — OXYCODONE HCL ER 20 MG PO T12A
80.0000 mg | EXTENDED_RELEASE_TABLET | Freq: Three times a day (TID) | ORAL | Status: DC
  Administered 2013-04-20 – 2013-04-21 (×3): 80 mg via ORAL
  Filled 2013-04-20 (×3): qty 4

## 2013-04-20 MED ORDER — POLYETHYLENE GLYCOL 3350 17 G PO PACK
17.0000 g | PACK | Freq: Every day | ORAL | Status: DC | PRN
  Filled 2013-04-20: qty 1

## 2013-04-20 MED ORDER — CLOPIDOGREL BISULFATE 75 MG PO TABS
75.0000 mg | ORAL_TABLET | Freq: Every day | ORAL | Status: DC
  Administered 2013-04-20 – 2013-04-22 (×3): 75 mg via ORAL
  Filled 2013-04-20 (×3): qty 1

## 2013-04-20 MED ORDER — INSULIN ASPART 100 UNIT/ML ~~LOC~~ SOLN
0.0000 [IU] | Freq: Every day | SUBCUTANEOUS | Status: DC
  Administered 2013-04-21: 2 [IU] via SUBCUTANEOUS

## 2013-04-20 MED ORDER — ADULT MULTIVITAMIN W/MINERALS CH
1.0000 | ORAL_TABLET | Freq: Every day | ORAL | Status: DC
  Administered 2013-04-20 – 2013-04-22 (×3): 1 via ORAL
  Filled 2013-04-20 (×3): qty 1

## 2013-04-20 MED ORDER — FINASTERIDE 5 MG PO TABS
5.0000 mg | ORAL_TABLET | Freq: Two times a day (BID) | ORAL | Status: DC
  Administered 2013-04-20 – 2013-04-22 (×5): 5 mg via ORAL
  Filled 2013-04-20 (×6): qty 1

## 2013-04-20 MED ORDER — METOPROLOL TARTRATE 25 MG PO TABS
25.0000 mg | ORAL_TABLET | Freq: Two times a day (BID) | ORAL | Status: DC
  Administered 2013-04-20 – 2013-04-22 (×5): 25 mg via ORAL
  Filled 2013-04-20 (×6): qty 1

## 2013-04-20 MED ORDER — NITROGLYCERIN 2 % TD OINT
1.0000 [in_us] | TOPICAL_OINTMENT | Freq: Once | TRANSDERMAL | Status: AC
  Administered 2013-04-20: 1 [in_us] via TOPICAL
  Filled 2013-04-20: qty 1

## 2013-04-20 MED ORDER — HYDROCODONE-ACETAMINOPHEN 10-325 MG PO TABS
1.0000 | ORAL_TABLET | Freq: Three times a day (TID) | ORAL | Status: DC | PRN
  Administered 2013-04-20 – 2013-04-22 (×5): 1 via ORAL
  Filled 2013-04-20 (×5): qty 1

## 2013-04-20 MED ORDER — METOCLOPRAMIDE HCL 5 MG PO TABS
5.0000 mg | ORAL_TABLET | Freq: Four times a day (QID) | ORAL | Status: DC | PRN
  Administered 2013-04-20: 5 mg via ORAL
  Filled 2013-04-20: qty 1

## 2013-04-20 MED ORDER — INSULIN GLARGINE 100 UNIT/ML ~~LOC~~ SOLN
15.0000 [IU] | Freq: Every day | SUBCUTANEOUS | Status: DC
  Administered 2013-04-20 – 2013-04-22 (×3): 15 [IU] via SUBCUTANEOUS
  Filled 2013-04-20 (×3): qty 0.15

## 2013-04-20 MED ORDER — INSULIN ASPART 100 UNIT/ML ~~LOC~~ SOLN
0.0000 [IU] | Freq: Three times a day (TID) | SUBCUTANEOUS | Status: DC
  Administered 2013-04-20: 5 [IU] via SUBCUTANEOUS
  Administered 2013-04-20: 3 [IU] via SUBCUTANEOUS
  Administered 2013-04-21: 2 [IU] via SUBCUTANEOUS
  Administered 2013-04-21: 8 [IU] via SUBCUTANEOUS
  Administered 2013-04-21: 3 [IU] via SUBCUTANEOUS
  Administered 2013-04-22: 2 [IU] via SUBCUTANEOUS
  Administered 2013-04-22: 8 [IU] via SUBCUTANEOUS

## 2013-04-20 MED ORDER — ENOXAPARIN SODIUM 40 MG/0.4ML ~~LOC~~ SOLN
40.0000 mg | SUBCUTANEOUS | Status: DC
  Administered 2013-04-20 – 2013-04-22 (×3): 40 mg via SUBCUTANEOUS
  Filled 2013-04-20 (×3): qty 0.4

## 2013-04-20 MED ORDER — OXYMETAZOLINE HCL 0.05 % NA SOLN
1.0000 | Freq: Once | NASAL | Status: AC
  Administered 2013-04-20: 1 via NASAL
  Filled 2013-04-20: qty 15

## 2013-04-20 MED ORDER — OXYCODONE HCL ER 10 MG PO T12A
80.0000 mg | EXTENDED_RELEASE_TABLET | Freq: Two times a day (BID) | ORAL | Status: DC
  Administered 2013-04-20: 80 mg via ORAL
  Filled 2013-04-20: qty 8

## 2013-04-20 MED ORDER — ATORVASTATIN CALCIUM 10 MG PO TABS
10.0000 mg | ORAL_TABLET | Freq: Every day | ORAL | Status: DC
  Administered 2013-04-20 – 2013-04-22 (×3): 10 mg via ORAL
  Filled 2013-04-20 (×3): qty 1

## 2013-04-20 MED ORDER — SODIUM CHLORIDE 0.9 % IV SOLN
INTRAVENOUS | Status: DC
  Administered 2013-04-20: 13:00:00 via INTRAVENOUS

## 2013-04-20 MED ORDER — POLYETHYLENE GLYCOL 3350 17 G PO PACK
17.0000 g | PACK | Freq: Every day | ORAL | Status: DC
  Administered 2013-04-20 – 2013-04-21 (×2): 17 g via ORAL
  Filled 2013-04-20 (×3): qty 1

## 2013-04-20 MED ORDER — NITROGLYCERIN 0.4 MG SL SUBL: 0.4000 mg | SUBLINGUAL_TABLET | SUBLINGUAL | Status: DC | PRN

## 2013-04-20 MED ORDER — AMLODIPINE BESYLATE 10 MG PO TABS
10.0000 mg | ORAL_TABLET | Freq: Every day | ORAL | Status: DC
  Administered 2013-04-20 – 2013-04-22 (×3): 10 mg via ORAL
  Filled 2013-04-20 (×3): qty 1

## 2013-04-20 NOTE — ED Notes (Signed)
Patient arrives from home with complaint of substernal chest pain starting around 0050, initially rated at 9/10 per EMS. Pain increase with palpation. EMS gave 324 aspirin and 1 NTG SL tab. Pain was relieved completely after NTG. Additionally patient stopped taking oxycodone about 2 days ago after taking for long period of time, and has had a great deal of emotional stress as both of his parents have died in the past month with his mother being buried yesterday.

## 2013-04-20 NOTE — H&P (Addendum)
[name] is an 68 y.o. male.   PCP:   [name] Lyons, MD   Chief Complaint:  Chest Pain, SOB, Opiate Withdrawal, Nasal congestion.  HPI: 66 Male pt of [name].  He is on Oxycontin 80 5 times per day and up to 5 Hydrocodone/APAP 10/325 per day as Rxed per [name] but he has had one OV c [name] Hardin Negus at the pain center.  They started working on [name] plan to adjust these meds.  They have appt in April and will see [name] after that.  Lately he has been running out of Narcs early before he can refill them. He reports his LE and UE Neuropathic pains are not controlled on current regimen.  This past month he started taking extra doses of the Narcs as he has been having increasing pains from the emotional issues. [name] of 57 just died of [name] MI 5 weeks ago [name] (Alzheimers) died 66 days later and was buried yesterday. His next Rx is due to be picked up from [name] office Mon-Tuesday.  He has been diaphoretic and has been experiencing insomnia and severe aching pain throughout his body. His mouth feels dry. No N/V.  Feels better post Narcs.  CAD - s/p MI in 2006 followed by PCA. He presents with complaints of chest pain which started @ 1 am.  He was at rest.  Felt like [name] stab in chest.  Got more SOB and was scared. Pain was severe, aching, non-radiating. It resolved when treated with NTG SL administered by EMS. Patient experienced about 1 hour of pain. He denies history of similar sx. His MI in 2006 was accompanied by back pain.  The SOB resolved with resolution of chest pain. In ED no EKG Changes.  (-) Trop I.  Now wearing NTG Patch.  No current CP.  He had recent Narcotic related impaction as well.  Now on Miralax and often enemas.  He does not leave house.  He is on walker in house.  He is homebound.  He was unable to get to his parents funeral.  Moving causes pain.  They have trouble keeping up with his hygiene.  He has [name] wound in his L beard and [name] wound over L clavicle.  Last Cards was  [name]  In ED they just gave him Nasal Afrin.    Past Medical History:  Past Medical History  Diagnosis Date  . PONV (postoperative nausea and vomiting)   . Anginal pain     2003  . Mental disorder   . Depression   . Hypertension     [name] [name]  . Neuropathy   . MS (multiple sclerosis)   . Coronary artery disease   . Myocardial infarction   . Diabetes mellitus without complication     INSULIN DEPENDENT  . Osteomyelitis   . Neuromuscular disorder      MS & diabetic neuropathy  . Shortness of breath     has to have head elevated.  . Stroke     Left side- [name] little weakness  . Arthritis   . Dyslipidemia   . Gait disorder     Past Surgical History  Procedure Laterality Date  . Cardiac catheterization      2005  . Fracture surgery      rt foot  . Orif ankle fracture  12/30/2011    Procedure: OPEN REDUCTION INTERNAL FIXATION (ORIF) ANKLE FRACTURE;  Surgeon: [name] Minion, MD;  Location: [place];  Service: Orthopedics;  Laterality: Left;  Excision Bone, Abscess Left Charcot Foot, Place Antibiotic Beads, Fixation Charcot  . 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 Coronary angioplasty      cardiac stent  . Rectal  abscess    . Hardware removal Left 04/20/2012    Procedure: Left foot removal deep retained screw;  Surgeon: [name] Minion, MD;  Location: [place];  Service: Orthopedics;  Laterality: Left;      Allergies:  No Known Allergies   Medications: Prior to Admission medications   Medication Sig Start Date End Date Taking? Authorizing Provider  amLODipine (NORVASC) 10 MG tablet Take 10 mg by mouth daily.   Yes Historical Provider, MD  aspirin 81 MG tablet Take 81 mg by mouth daily.   Yes Historical Provider, MD  atorvastatin (LIPITOR) 10 MG tablet Take 10 mg by mouth daily.   Yes Historical Provider, MD  clopidogrel (PLAVIX) 75 MG tablet Take 75 mg by mouth daily.   Yes Historical Provider, MD  finasteride (PROSCAR) 5 MG tablet Take 5 mg by mouth 2 (two)  times daily.   Yes Historical Provider, MD  HYDROcodone-acetaminophen (NORCO) 10-325 MG per tablet Take 1 tablet by mouth 5 (five) times daily. 03/27/13  Yes [name] Ducking, MD  insulin NPH (HUMULIN N,NOVOLIN N) 100 UNIT/ML injection Inject 16-43 Units into the skin 2 (two) times daily. 43 units in AM, and 16 in the evening   Yes Historical Provider, MD  metoCLOPramide (REGLAN) 5 MG tablet Take 5 mg by mouth every 6 (six) hours as needed for nausea.   Yes Historical Provider, MD  metoprolol tartrate (LOPRESSOR) 25 MG tablet Take 25 mg by mouth 2 (two) times daily.   Yes Historical Provider, MD  Multiple Vitamin (MULTIVITAMIN WITH MINERALS) TABS tablet Take 1 tablet by mouth daily.   Yes Historical Provider, MD  nitroGLYCERIN (NITROSTAT) 0.4 MG SL tablet Place 0.4 mg under the tongue every 5 (five) minutes as needed. For chest pain   Yes Historical Provider, MD  OxyCODONE (OXYCONTIN) 80 mg T12A 12 hr tablet Take 1 tablet (80 mg total) by mouth every 6 (six) hours. 03/27/13  Yes [name] Ducking, MD  polyethylene glycol [facility] / [name]) packet Take 17 g by mouth daily.   Yes Historical Provider, MD  traZODone (DESYREL) 150 MG tablet Take 150 mg by mouth at bedtime.   Yes Historical Provider, MD      (Not in [name] hospital admission)   Social History:  reports that he has quit smoking. His smoking use included Cigars. He has never used smokeless tobacco. He reports that he does not drink alcohol or use illicit drugs.  Family History: Family History  Problem Relation Age of Onset  . Tremor Mother   . Heart disease Father   . Celiac disease Brother   . Cancer - Prostate Paternal Grandfather     Review of Systems:  Review of Systems - Full ROS obtained. He is currently CP free. He improved post Narcotics as he has been out for 3-4 days.  Physical Exam:  Blood pressure 142/83, pulse 85, temperature 97.6 F (36.4 C), temperature source Oral, resp. rate 17, weight 86.183 kg (190 lb), SpO2  99.00%. Filed Vitals:   04/20/13 0645 04/20/13 0700 04/20/13 0715 04/20/13 0730  BP: 177/90 173/89 168/77 142/83  Pulse: 88 95 89 85  Temp:      TempSrc:      Resp:      Weight:  SpO2: 97% 98% 97% 99%   General appearance: Scruffy Bearded gentleman .  Neck held to R and [name] little stiff.  Looks older than stated age. Head: Normocephalic, without obvious abnormality, atraumatic Small friction irritation in L beard aresa and wound on L Clavicle. Eyes: conjunctivae/corneas clear. PERRL, EOM's intact.  Nose: congested. Mouth dry.  Tounge deviates slightly to R Throat: lips, mucosa, and tongue normal; teeth and gums normal Neck: no adenopathy, no carotid bruit, no JVD and thyroid not enlarged, symmetric, no tenderness/mass/nodules Resp: CTA B  Cardio: Reg c slight m GI: soft, non-tender; bowel sounds normal; no masses,  no organomegaly Extremities: extremities normal, atraumatic, no cyanosis or edema.  Tender to touch over shin areas and feet. Lymph nodes:  no cervical lymphadenopathy Neurologic: Alert and oriented X 3, better movement than expected.  Slightly weak grip strngth on L - he says it is stable.     Labs on Admission:   Recent Labs  04/20/13 0334  NA 136*  K 4.0  CL 97  CO2 27  GLUCOSE 217*  BUN 11  CREATININE 0.95  CALCIUM 8.9   No results found for this basename: AST, ALT, ALKPHOS, BILITOT, PROT, ALBUMIN,  in the last 72 hours No results found for this basename: LIPASE, AMYLASE,  in the last 72 hours  Recent Labs  04/20/13 0334  WBC 6.5  NEUTROABS 4.3  HGB 14.0  HCT 39.5  MCV 93.6  PLT 328   No results found for this basename: CKTOTAL, CKMB, CKMBINDEX, TROPONINI,  in the last 72 hours Lab Results  Component Value Date   INR 0.94 04/20/2012   INR 0.94 12/29/2011   INR 1.0 09/20/2008     LAB RESULT POCT:  Results for orders placed during the hospital encounter of 04/20/13  CBC WITH DIFFERENTIAL      Result Value Ref Range   WBC 6.5  4.0 - 10.5  K/uL   RBC 4.22  4.22 - 5.81 MIL/uL   Hemoglobin 14.0  13.0 - 17.0 g/dL   HCT 39.5  39.0 - 52.0 %   MCV 93.6  78.0 - 100.0 fL   MCH 33.2  26.0 - 34.0 pg   MCHC 35.4  30.0 - 36.0 g/dL   RDW 12.9  11.5 - 15.5 %   Platelets 328  150 - 400 K/uL   Neutrophils Relative % 67  43 - 77 %   Neutro Abs 4.3  1.7 - 7.7 K/uL   Lymphocytes Relative 22  12 - 46 %   Lymphs Abs 1.5  0.7 - 4.0 K/uL   Monocytes Relative 9  3 - 12 %   Monocytes Absolute 0.6  0.1 - 1.0 K/uL   Eosinophils Relative 2  0 - 5 %   Eosinophils Absolute 0.1  0.0 - 0.7 K/uL   Basophils Relative 0  0 - 1 %   Basophils Absolute 0.0  0.0 - 0.1 K/uL  BASIC METABOLIC PANEL      Result Value Ref Range   Sodium 136 (*) 137 - 147 mEq/L   Potassium 4.0  3.7 - 5.3 mEq/L   Chloride 97  96 - 112 mEq/L   CO2 27  19 - 32 mEq/L   Glucose, Bld 217 (*) 70 - 99 mg/dL   BUN 11  6 - 23 mg/dL   Creatinine, Ser 0.95  0.50 - 1.35 mg/dL   Calcium 8.9  8.4 - 10.5 mg/dL   GFR calc non Af Amer 85 (*) >90  mL/min   GFR calc Af Amer >90  >90 mL/min  I-STAT TROPOININ, ED      Result Value Ref Range   Troponin i, poc 0.01  0.00 - 0.08 ng/mL   Comment 3               Radiological Exams on Admission: Dg Chest 2 View  04/20/2013   CLINICAL DATA:  Chest pain  EXAM: CHEST  2 VIEW  COMPARISON:  12/29/2011  FINDINGS: COPD with hyperinflation. Negative for pneumonia. Negative for heart failure or effusion.  IMPRESSION: COPD.  No acute abnormality.   Electronically Signed   By: Franchot Gallo M.D.   On: 04/20/2013 04:25      Orders placed during the hospital encounter of 04/20/13  . EKG 12-LEAD  . EKG 12-LEAD  . EKG 12-LEAD  . EKG 12-LEAD   NSR, rate 81 bpm, Q waves in anterior leads, diffuse t wave flattening - most notable in the anterolateral leads, normal axis.    Assessment/Plan Active Problems:   DM   HYPERTENSION   CAD   Polyneuropathy in diabetes(357.2)   Multiple sclerosis   Chest pain   Late effects of cerebrovascular  accident  Narcotic Withdrawal (c Sxs of diaphoresis, insomnia, anxiety, dry mouth and nares, and severe aching pain throughout his body) from finishing his Complicated regimen early.  They called for early refill and [name] appropriately said it was too soon as he had [name] 30 day supply on 02/18. The pt is in the process of of seeing [name] Hardin Negus and discussing some alternate treatments.  Getting him off the Narcotics completely would be [name] very difficult endevour as he has terrible pains.  I am assuming he has tried Lyrica/Neurontin etc.  He clearly is on Lots of Narcotics and clearly is [place] medicating.  He started taking more narcotics for emotional management.  [name] last note 11/2012 stated: "The patient will be changed to [place] Contin from OxyContin to help with the cost of the pain management. The patient will be set up for [name] referral to [name] pain center to consider the possibility of [name] spinal stimulator, or even [name] morphine pump to help pain in the future. The patient will followup in 6 months. The patient was given [name] prescription for the MS Contin."  I have ordered Oxycontin 80 TID and Hydro/APAP up to 3 times per day as that is the max I am comfortable with.  I have asked Neuro to consult and help out.  Rx Sxs where able.  Non-Cardiac CP in pt c h/o CAD - s/p MI in 2006 - R/Out MI and get Cards to see him as he does need [name] cardiologist.  Diabetes Mellitus - Switch N to Lantus and do SSI.  Monitor and Manage  Diabetic peripheral neuropathy - difficult issue and would appreciate any help I can get to manage him.  L jaw and L Clavicle wound - Wound care consulted.  cerebrovascular disease - RF reduction the best we can do,   Lipids on Lipitor.  Depression - On Trazodone.  - add low dose xanax.  prior history of multiple sclerosis  gait disorder - PT/OT ordered.  AFTT - The patient is using [name] walker for ambulation.  Barely leaves the house.  He is stressing out his wife. He has what sounds  like [name] very poor QOL.   I brought up AL and he is not that interested.  His wife is interested in [place] services.  Will get evals in house and get Social worker consult.   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 Denies alochol, drug, tobacco use.  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 Denies alochol, drug, tobacco use.  Last saw [name] 09/17/12 for: 1. CVA WITH LEFT HEMIPARESIS (ICD-438.20) (ICD10-I69.959) on ASA and Plavix. 2. DIABETES MELLITUS, TYPE I, WITH RENAL COMPLICATIONS (UYQ-034.74) (ICD10-E10.29)    Novolog Flexpen 100 Unit/ml Soln (Insulin aspart) ..... Use as directed.   20-25 units qs       Humulin N 100 Unit/ml Susp (Insulin isophane human) ..... Inject subq 43 units in the morning and 17 units in the evening. Last Hgb A1C: 7.2 (09/17/2012 3:07:18 PM) Last LDL:  59 (01/24/2011 4:03:00 PM) Last microalbumin:  340.6 MG/DL (01/01/2010 11:05:00 AM) Last eye exam: [name] Claudean Kinds 26yr recheck (10/20/2009 5:06:09 PM) Last foot exam:  06/15/2012 Last GFR:   61.9 (?) (01/24/2011 4:03:00 PM) , 51.2 (?) (01/24/2011 4:03:00  PM) 3. CHRONIC KIDNEY DISEASE STAGE II (MILD) (ICD-585.2) (ICD10-N18.2) 4. CAD (ICD-414.00) (ICD10-I25.10) 5. DIABETIC PERIPHERAL NEUROPATHY (ICD-250.60) (ICD10-E11.40) 6. CHRONIC PAIN SYNDROME (ICD-338.4) (ICD10-G89.4) 7. MULTIPLE SCLEROSIS (ICD-340) (ICD10-G35) 9 Clavical ulcer/cellulitis - was referred to [facility] and given Abx.  Told to come back in 4 months and it is now > 33m.  Complete Medication List: 1)  Bd Insulin Syringe 28g X 1/2" 1 Ml Misc (Insulin syringe-needle u-100) .... Use as directed tid 2)  Bd U/f Short Pen Needle 31g X 8 Mm Misc (Insulin pen needle) .... Use as directed with insulin pen qd 3)  Novolog Flexpen 100 Unit/ml Soln (Insulin aspart) .... Use as directed.   20-25 units qs 4)  Glycolax Powd (Polyethylene glycol 3350) .Marland KitchenMarland Kitchen. 17 gm daily 5)  Hydrocodone-acetaminophen 10-325 Mg Tabs (Hydrocodone-acetaminophen) .... One by mouth up to 5 tabs [name] day 6)  Lipitor 10 Mg Tabs (Atorvastatin calcium) .Marland Kitchen.. 1 tab po qhs 7)  Metoprolol Tartrate 25 Mg Tabs (Metoprolol tartrate) .Marland Kitchen.. 1 tab po bid 8)  Nitrostat 0.4 Mg Subl (Nitroglycerin) .... Prn 9)  Norvasc 5 Mg Tabs (Amlodipine besylate) .Marland Kitchen.. 1 tab po qd 10)  Plavix 75 Mg Tabs (Clopidogrel bisulfate) .Marland Kitchen.. 1 tab po qd 11)  Oxycontin 80 Mg Xr12h-tab (Oxycodone hcl) .Marland Kitchen.. 1 tab po  every day up to 5 tabs 12)  Trazodone Hcl 150 Mg Tabs (Trazodone hcl) .Marland Kitchen.. 1 tab po qd 13)  Trazodone Hcl 150 Mg Tabs (Trazodone hcl) .Marland Kitchen.. 1 tab po qd 14)  Aspirin 81 Mg Ec Tab (Aspirin) .... Take one (1) tablet by mouth daily 15)  Finasteride 5 Mg Tabs (Finasteride) .... Take 1 tab po bid 16)  Humulin N 100 Unit/ml Susp (Insulin isophane human) .... Inject subq  43 units in the morning and 17 units in the evening. 17)  Accu-chek Compact Test Drum Strp (Glucose blood) .... Check blood sugar at least 3 times [name] day as directed

## 2013-04-20 NOTE — Consult Note (Signed)
Primary Physician:  Reynaldo Minium Primary Cardiologist: previous was T Stuckey   HPI:  Patient is a 68 yo with history of CAD and chronic pain.  Asked to see re CP  Patient last seen by Lowella Dell in clinic in 2011   History of CAD (s/p PTCA/DES Cypher to distal RCA 2005), HTN, HL, MS and DM He was admitted yesterday for CP and SOB  He also says the was going though opiate withdrawal Patient says his chest pain was sharp, substernal  Eventually became dull  Lasted about 1 1/2 hours.  Noted increased SOB  Worse to breath.   Since Rx with NTG pain has gone   He says this is different that pain when had MI in 2005  That was a dull pain between shoulder blades  He did not have any of this yesterday  Patient notes increased stress due to death of relatives recently.       Past Medical History  Diagnosis Date  . PONV (postoperative nausea and vomiting)   . Anginal pain     2003  . Mental disorder   . Depression   . Hypertension     dr Reynaldo Minium  . Neuropathy   . MS (multiple sclerosis)   . Coronary artery disease   . Myocardial infarction   . Diabetes mellitus without complication     INSULIN DEPENDENT  . Osteomyelitis   . Neuromuscular disorder      MS & diabetic neuropathy  . Shortness of breath     has to have head elevated.  . Stroke     Left side- a little weakness  . Arthritis   . Dyslipidemia   . Gait disorder     Medications Prior to Admission  Medication Sig Dispense Refill  . amLODipine (NORVASC) 10 MG tablet Take 10 mg by mouth daily.      Marland Kitchen aspirin 81 MG tablet Take 81 mg by mouth daily.      Marland Kitchen atorvastatin (LIPITOR) 10 MG tablet Take 10 mg by mouth daily.      . clopidogrel (PLAVIX) 75 MG tablet Take 75 mg by mouth daily.      . finasteride (PROSCAR) 5 MG tablet Take 5 mg by mouth 2 (two) times daily.      Marland Kitchen HYDROcodone-acetaminophen (NORCO) 10-325 MG per tablet Take 1 tablet by mouth 5 (five) times daily.  150 tablet  0  . insulin NPH (HUMULIN N,NOVOLIN N) 100  UNIT/ML injection Inject 16-43 Units into the skin 2 (two) times daily. 43 units in AM, and 16 in the evening      . metoCLOPramide (REGLAN) 5 MG tablet Take 5 mg by mouth every 6 (six) hours as needed for nausea.      . metoprolol tartrate (LOPRESSOR) 25 MG tablet Take 25 mg by mouth 2 (two) times daily.      . Multiple Vitamin (MULTIVITAMIN WITH MINERALS) TABS tablet Take 1 tablet by mouth daily.      . nitroGLYCERIN (NITROSTAT) 0.4 MG SL tablet Place 0.4 mg under the tongue every 5 (five) minutes as needed. For chest pain      . OxyCODONE (OXYCONTIN) 80 mg T12A 12 hr tablet Take 1 tablet (80 mg total) by mouth every 6 (six) hours.  120 tablet  0  . polyethylene glycol (MIRALAX / GLYCOLAX) packet Take 17 g by mouth daily.      . traZODone (DESYREL) 150 MG tablet Take 150 mg by mouth at bedtime.         Marland Kitchen  amLODipine  10 mg Oral Daily  . aspirin EC  81 mg Oral Daily  . atorvastatin  10 mg Oral Daily  . clopidogrel  75 mg Oral Daily  . enoxaparin (LOVENOX) injection  40 mg Subcutaneous Q24H  . finasteride  5 mg Oral BID  . insulin aspart  0-15 Units Subcutaneous TID WC  . insulin aspart  0-5 Units Subcutaneous QHS  . insulin glargine  15 Units Subcutaneous Daily  . metoprolol tartrate  25 mg Oral BID  . multivitamin with minerals  1 tablet Oral Daily  . OxyCODONE  80 mg Oral 3 times per day  . polyethylene glycol  17 g Oral Daily  . traZODone  150 mg Oral QHS    Infusions: . sodium chloride      No Known Allergies  History   Social History  . Marital Status: Married    Spouse Name: N/A    Number of Children: 2  . Years of Education: College   Occupational History  . Not on file.   Social History Main Topics  . Smoking status: Former Smoker -- 7 years    Types: Cigars  . Smokeless tobacco: Never Used  . Alcohol Use: No  . Drug Use: No  . Sexual Activity: Not on file   Other Topics Concern  . Not on file   Social History Narrative  . No narrative on file     Family History  Problem Relation Age of Onset  . Tremor Mother   . Heart disease Father   . Celiac disease Brother   . Cancer - Prostate Paternal Grandfather     REVIEW OF SYSTEMS:  All systems reviewed  Negative to the above problem except as noted above.    PHYSICAL EXAM: Filed Vitals:   04/20/13 0927  BP: 156/74  Pulse: 93  Temp: 97.9 F (36.6 C)  Resp: 18    No intake or output data in the 24 hours ending 04/20/13 1116  General:  Well appearing. No respiratory difficulty HEENT: normal Neck: supple. no JVD. Carotids 2+ bilat; no bruits. No lymphadenopathy or thryomegaly appreciated  L neck erythmatous Cor: PMI nondisplaced. Regular rate & rhythm. No rubs, gallops or murmurs. Chest Mild tenderness   Lungs: clear Abdomen: soft, nontender, nondistended. No hepatosplenomegaly. No bruits or masses. Good bowel sounds. Extremities: no cyanosis, clubbing, rash  Tr edema Neuro: alert & oriented x 3, cranial nerves grossly intact. moves all 4 extremities w/o difficulty.Feet numb to palpation to calf  ECG:  SR 81  Nonspecific ST T wave changes    Results for orders placed during the hospital encounter of 04/20/13 (from the past 24 hour(s))  CBC WITH DIFFERENTIAL     Status: None   Collection Time    04/20/13  3:34 AM      Result Value Ref Range   WBC 6.5  4.0 - 10.5 K/uL   RBC 4.22  4.22 - 5.81 MIL/uL   Hemoglobin 14.0  13.0 - 17.0 g/dL   HCT 39.5  39.0 - 52.0 %   MCV 93.6  78.0 - 100.0 fL   MCH 33.2  26.0 - 34.0 pg   MCHC 35.4  30.0 - 36.0 g/dL   RDW 12.9  11.5 - 15.5 %   Platelets 328  150 - 400 K/uL   Neutrophils Relative % 67  43 - 77 %   Neutro Abs 4.3  1.7 - 7.7 K/uL   Lymphocytes Relative 22  12 - 46 %  Lymphs Abs 1.5  0.7 - 4.0 K/uL   Monocytes Relative 9  3 - 12 %   Monocytes Absolute 0.6  0.1 - 1.0 K/uL   Eosinophils Relative 2  0 - 5 %   Eosinophils Absolute 0.1  0.0 - 0.7 K/uL   Basophils Relative 0  0 - 1 %   Basophils Absolute 0.0  0.0 - 0.1 K/uL   BASIC METABOLIC PANEL     Status: Abnormal   Collection Time    04/20/13  3:34 AM      Result Value Ref Range   Sodium 136 (*) 137 - 147 mEq/L   Potassium 4.0  3.7 - 5.3 mEq/L   Chloride 97  96 - 112 mEq/L   CO2 27  19 - 32 mEq/L   Glucose, Bld 217 (*) 70 - 99 mg/dL   BUN 11  6 - 23 mg/dL   Creatinine, Ser 0.95  0.50 - 1.35 mg/dL   Calcium 8.9  8.4 - 10.5 mg/dL   GFR calc non Af Amer 85 (*) >90 mL/min   GFR calc Af Amer >90  >90 mL/min  I-STAT TROPOININ, ED     Status: None   Collection Time    04/20/13  3:40 AM      Result Value Ref Range   Troponin i, poc 0.01  0.00 - 0.08 ng/mL   Comment 3           CK TOTAL AND CKMB     Status: None   Collection Time    04/20/13 10:00 AM      Result Value Ref Range   Total CK 30  7 - 232 U/L   CK, MB 1.1  0.3 - 4.0 ng/mL   Relative Index RELATIVE INDEX IS INVALID  0.0 - 2.5  TROPONIN I     Status: None   Collection Time    04/20/13 10:00 AM      Result Value Ref Range   Troponin I <0.30  <0.30 ng/mL  GLUCOSE, CAPILLARY     Status: Abnormal   Collection Time    04/20/13 11:01 AM      Result Value Ref Range   Glucose-Capillary 247 (*) 70 - 99 mg/dL   Comment 1 Notify RN     Dg Chest 2 View  04/20/2013   CLINICAL DATA:  Chest pain  EXAM: CHEST  2 VIEW  COMPARISON:  12/29/2011  FINDINGS: COPD with hyperinflation. Negative for pneumonia. Negative for heart failure or effusion.  IMPRESSION: COPD.  No acute abnormality.   Electronically Signed   By: Franchot Gallo M.D.   On: 04/20/2013 04:25     ASSESSMENT: Patient is a 68 yo with multiple medical problmes (CAD, HTN, DM , MS, neuropathy, CVA, Depression, chronic pain syndrome)  Presents with CP  Now resolved  Troponin negative,.  EKG negative Pain is different than his previuos angina.  I do not think it was cardiac in origin.  I would follow clinically for changes but would not schedule any testing now.  2.  CAD  Follow clinically  Continue meds  3.  HL  Keep on statin    4.   Chronic pain / narcotic use. Per IM service  Please call if conditions change  WIll sign off for now.

## 2013-04-20 NOTE — ED Notes (Signed)
Attempted report and unable to take per peggy on 3700

## 2013-04-20 NOTE — ED Provider Notes (Addendum)
CSN: [id number]     Arrival date & time 04/20/13  0258 History   First MD Initiated Contact with Patient 04/20/13 0302     Chief Complaint  Patient presents with  . Chest Pain     (Consider location/radiation/quality/duration/timing/severity/associated sxs/prior Treatment) HPI Patient is a 68 yo man with a history of CAD - s/p MI in 2006 followed by PCA. He presents with complaints of chest pain which began about 90 minutes ago while patient was at rest. Pain was severe, aching, non-radiating. It resolved when treated with NTG SL administered by EMS. Patient experienced about 1 hour of pain. He denies history of similar sx. His MI in 2006 was accompanied by back pain. The patient does endorse associated SOB. The SOB resolved with resolution of chest pain.   The patient is not currently followed by Cardiology.   The patient also reports that he is withdrawing from opiates. 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  He ran out of oxycodone and oxycontin 2 days ago. He had been taking Oxycontin 80mg  - approximately 5 per day. He take hydrocodone/apap 10mg  for breakthrough pain. He has been diaphoretic and has been experiencing insomnia and severe aching pain throughout his body. His mouth feels dry.   Past Medical History  Diagnosis Date  . PONV (postoperative nausea and vomiting)   . Anginal pain     2003  . Mental disorder   . Depression   . Hypertension     dr Reynaldo Minium  . Neuropathy   . MS (multiple sclerosis)   . Coronary artery disease   . Myocardial infarction   . Diabetes mellitus without complication     INSULIN DEPENDENT  . Osteomyelitis   . Neuromuscular disorder      MS & diabetic neuropathy  . Shortness of breath     has to have head elevated.  . Stroke     Left side- a little weakness  . Arthritis   . Dyslipidemia   . Gait disorder    Past Surgical History  Procedure Laterality Date  . Cardiac catheterization      2005  . Fracture surgery      rt  foot  . Orif ankle fracture  12/30/2011    Procedure: OPEN REDUCTION INTERNAL FIXATION (ORIF) ANKLE FRACTURE;  Surgeon: Newt Minion, MD;  Location: [place];  Service: Orthopedics;  Laterality: Left;  Excision Bone, Abscess Left Charcot Foot, Place Antibiotic Beads, Fixation Charcot  . 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 Coronary angioplasty      cardiac stent  . Rectal  abscess    . Hardware removal Left 04/20/2012    Procedure: Left foot removal deep retained screw;  Surgeon: Newt Minion, MD;  Location: [place];  Service: Orthopedics;  Laterality: Left;   Family History  Problem Relation Age of Onset  . Tremor Mother   . Heart disease Father   . Celiac disease Brother   . Cancer - Prostate Paternal Grandfather    History  Substance Use Topics  . Smoking status: Former Smoker -- 7 years    Types: Cigars  . Smokeless tobacco: Never Used  . Alcohol Use: No    Review of Systems Ten point review of symptoms performed and is negative with the exception of symptoms noted above.     Allergies  Review of patient's allergies indicates no known allergies.  Home Medications   Current Outpatient Rx  Name  Route  Sig  Dispense  Refill  . amLODipine (NORVASC) 10 MG tablet   Oral   Take 10 mg by mouth daily.         Marland Kitchen aspirin 81 MG tablet   Oral   Take 81 mg by mouth daily.         Marland Kitchen atorvastatin (LIPITOR) 10 MG tablet   Oral   Take 10 mg by mouth daily.         . clopidogrel (PLAVIX) 75 MG tablet   Oral   Take 75 mg by mouth daily.         . finasteride (PROSCAR) 5 MG tablet   Oral   Take 5 mg by mouth 2 (two) times daily.         Marland Kitchen HYDROcodone-acetaminophen (NORCO) 10-325 MG per tablet   Oral   Take 1 tablet by mouth 5 (five) times daily.   150 tablet   0     Please call patient for pick up   . insulin aspart (NOVOLOG) 100 UNIT/ML injection   Subcutaneous   Inject 5-10 Units into the skin daily as needed. Per sliding scale 200=5units, 400=10  units. Takes only when he needs it per patient.         . insulin NPH (HUMULIN N,NOVOLIN N) 100 UNIT/ML injection   Subcutaneous   Inject 17-42 Units into the skin 2 (two) times daily. 42units in AM, and 17 in the evening         . metoCLOPramide (REGLAN) 5 MG tablet      TAKE 1 TABLET (5 MG TOTAL) BY MOUTH EVERY 6 (SIX) HOURS AS NEEDED.   120 tablet   6   . metoprolol tartrate (LOPRESSOR) 25 MG tablet   Oral   Take 25 mg by mouth 2 (two) times daily.         . nitroGLYCERIN (NITROSTAT) 0.4 MG SL tablet   Sublingual   Place 0.4 mg under the tongue every 5 (five) minutes as needed. For chest pain         . OxyCODONE (OXYCONTIN) 40 mg T12A 12 hr tablet   Oral   Take 1 tablet (40 mg total) by mouth every 12 (twelve) hours.   60 tablet   0     Please call patient for pick up   . OxyCODONE (OXYCONTIN) 80 mg T12A 12 hr tablet   Oral   Take 1 tablet (80 mg total) by mouth every 6 (six) hours.   120 tablet   0     Please call patient for pick up   . traZODone (DESYREL) 150 MG tablet   Oral   Take 150 mg by mouth at bedtime.          BP 136/80  Pulse 79  Temp(Src) 97.6 F (36.4 C) (Oral)  Resp 20  SpO2 98% Physical Exam Gen: well developed and well nourished appearing Head: NCAT Eyes: PERL, EOMI Nose: no epistaixis or rhinorrhea Mouth/throat: mucosa is moist and pink, FACE: bearded, there is a 3cm eschar over the left submental region, no ttp, no exudate, no surrounding induration or erythema Neck: supple, no stridor Lungs: CTA B, no wheezing, rhonchi or rales CV: RRR, no murmur, extremities appear well perfused.  Chest wall: 3cm x 1cm open  Ulcerated wound over the mid portion of the left clavicle. No purulent drainage, approximately 4cm margin of erythema and increased warmth which is nontender Abd: soft, notender, nondistended Back: no ttp, no cva ttp Skin: warm  and dry Ext: normal to inspection, no dependent edema Neuro: CN ii-xii grossly intact,  good strength all 4 extremities, normal speech Psyche; normal affect,  calm and cooperative.  ED Course  Procedures (including critical care time) Labs Review  Results for orders placed during the hospital encounter of 04/20/13 (from the past 24 hour(s))  CBC WITH DIFFERENTIAL     Status: None   Collection Time    04/20/13  3:34 AM      Result Value Ref Range   WBC 6.5  4.0 - 10.5 K/uL   RBC 4.22  4.22 - 5.81 MIL/uL   Hemoglobin 14.0  13.0 - 17.0 g/dL   HCT 39.5  39.0 - 52.0 %   MCV 93.6  78.0 - 100.0 fL   MCH 33.2  26.0 - 34.0 pg   MCHC 35.4  30.0 - 36.0 g/dL   RDW 12.9  11.5 - 15.5 %   Platelets 328  150 - 400 K/uL   Neutrophils Relative % 67  43 - 77 %   Neutro Abs 4.3  1.7 - 7.7 K/uL   Lymphocytes Relative 22  12 - 46 %   Lymphs Abs 1.5  0.7 - 4.0 K/uL   Monocytes Relative 9  3 - 12 %   Monocytes Absolute 0.6  0.1 - 1.0 K/uL   Eosinophils Relative 2  0 - 5 %   Eosinophils Absolute 0.1  0.0 - 0.7 K/uL   Basophils Relative 0  0 - 1 %   Basophils Absolute 0.0  0.0 - 0.1 K/uL  BASIC METABOLIC PANEL     Status: Abnormal   Collection Time    04/20/13  3:34 AM      Result Value Ref Range   Sodium 136 (*) 137 - 147 mEq/L   Potassium 4.0  3.7 - 5.3 mEq/L   Chloride 97  96 - 112 mEq/L   CO2 27  19 - 32 mEq/L   Glucose, Bld 217 (*) 70 - 99 mg/dL   BUN 11  6 - 23 mg/dL   Creatinine, Ser 0.95  0.50 - 1.35 mg/dL   Calcium 8.9  8.4 - 10.5 mg/dL   GFR calc non Af Amer 85 (*) >90 mL/min   GFR calc Af Amer >90  >90 mL/min  I-STAT TROPOININ, ED     Status: None   Collection Time    04/20/13  3:40 AM      Result Value Ref Range   Troponin i, poc 0.01  0.00 - 0.08 ng/mL   Comment 3            DG Chest 2 View (Final result)  Result time: 04/20/13 04:25:13    Final result by Rad Results In Interface (04/20/13 04:25:13)    Narrative:   CLINICAL DATA: Chest pain  EXAM: CHEST 2 VIEW  COMPARISON: 12/29/2011  FINDINGS: COPD with hyperinflation. Negative for pneumonia. Negative  for heart failure or effusion.  IMPRESSION: COPD. No acute abnormality.   Electronically Signed By: Franchot Gallo M.D. On: 04/20/2013 04:25      EKG: NSR, rate 81 bpm, Q waves in anterior leads, diffuse t wave flattening - most notable in the anterolateral leads, normal axis.   MDM   DDX: ACS, pneumothorax, pneumonia, pericardial or pleural effusion, gastritis, GERD/PUD, musculoskeletal pain.   ED work up is nondiagnostic. NO further episodes of chest pain. However, the patient now complains of SOB. He says he can't breath because his nose is congested. He requests Afrin.  GMA paged to admit the patient for rule out and Cardiology consultation.    Elyn Peers, MD 04/20/13 0700  Case discussed with [facility] physician on call who will see and evaluate the patient in the ED.   Elyn Peers, MD 04/20/13 [phone]

## 2013-04-20 NOTE — Progress Notes (Signed)
Nutrition Brief Note  Patient identified via consult for DM and obesity.   Wt Readings from Last 5 Encounters:  04/20/13 174 lb 13.2 oz (79.3 kg)  12/03/12 191 lb (86.637 kg)  10/03/12 197 lb (89.359 kg)  06/01/12 190 lb (86.183 kg)  03/31/11 204 lb (92.534 kg)    Body mass index is 23.71 kg/(m^2). Patient meets criteria for normal weight based on current BMI.   Current diet order is CHO modified, patient is consuming approximately 65% of meals at this time. Labs and medications reviewed. Admitted with chest pain. Recent emotional stress as both of his parents have died in the past month with his mother being buried yesterday, will order spiritual care consult.   Met with pt who reports eating well at home, 2 meals/day of diabetic foods/portions. States he has been a diabetic since he was 68 years old and denies having any questions about diabetic diet. States he lost 25 pounds intentionally in the past 6 months r/t monitoring portion sizes. Pt currently eating well with no nutritional concerns.   No nutrition interventions warranted at this time. If nutrition issues arise, please re-consult RD.   Mikey College MS, RD, LDN [phone] Weekend/After Hours Pager

## 2013-04-20 NOTE — Consult Note (Addendum)
Reason for Consult:Neuropathic pain Referring Physician: Virgina Jock  CC: Neuropathic pain  HPI: [name] is an 68 y.o. male who presented with complaints of chest pain.  The patient is followed by neurology for neuropathic pain.  Is maintained on Hydrocodone and Oxycontin.  Has had some trouble recently with his consumption and has been using his medication in less than thirty days.  Has recently had a lot of stressors and has been in more pain.  Because of this the patient was out of medication about a week early.  When he attempted to call for a refill, he was denied.  Chest pain felt to be secondary to withdrawal.  Patient has been followed by Dr. Jannifer Franklin and was recently referred to a pain clinic.  He has just had one appointment and therefore a plan of care has yet to be adopted.    Past Medical History  Diagnosis Date  . PONV (postoperative nausea and vomiting)   . Anginal pain     2003  . Mental disorder   . Depression   . Hypertension     dr Reynaldo Minium  . Neuropathy   . MS (multiple sclerosis)   . Coronary artery disease   . Myocardial infarction   . Diabetes mellitus without complication     INSULIN DEPENDENT  . Osteomyelitis   . Neuromuscular disorder      MS & diabetic neuropathy  . Shortness of breath     has to have head elevated.  . Stroke     Left side- a little weakness  . Arthritis   . Dyslipidemia   . Gait disorder     Past Surgical History  Procedure Laterality Date  . Cardiac catheterization      2005  . Fracture surgery      rt foot  . Orif ankle fracture  12/30/2011    Procedure: OPEN REDUCTION INTERNAL FIXATION (ORIF) ANKLE FRACTURE;  Surgeon: Newt Minion, MD;  Location: [place];  Service: Orthopedics;  Laterality: Left;  Excision Bone, Abscess Left Charcot Foot, Place Antibiotic Beads, Fixation Charcot  . 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 Coronary angioplasty      cardiac stent  . Rectal  abscess    . Hardware removal Left 04/20/2012     Procedure: Left foot removal deep retained screw;  Surgeon: Newt Minion, MD;  Location: [place];  Service: Orthopedics;  Laterality: Left;    Family History  Problem Relation Age of Onset  . Tremor Mother   . Heart disease Father   . Celiac disease Brother   . Cancer - Prostate Paternal Grandfather     Social History:  reports that he has quit smoking. His smoking use included Cigars. He has never used smokeless tobacco. He reports that he does not drink alcohol or use illicit drugs.  No Known Allergies  Medications:  I have reviewed the patient's current medications. Prior to Admission:  Prescriptions prior to admission  Medication Sig Dispense Refill  . amLODipine (NORVASC) 10 MG tablet Take 10 mg by mouth daily.      Marland Kitchen aspirin 81 MG tablet Take 81 mg by mouth daily.      Marland Kitchen atorvastatin (LIPITOR) 10 MG tablet Take 10 mg by mouth daily.      . clopidogrel (PLAVIX) 75 MG tablet Take 75 mg by mouth daily.      . finasteride (PROSCAR) 5 MG tablet Take 5 mg by mouth 2 (two) times daily.      Marland Kitchen  HYDROcodone-acetaminophen (NORCO) 10-325 MG per tablet Take 1 tablet by mouth 5 (five) times daily.  150 tablet  0  . insulin NPH (HUMULIN N,NOVOLIN N) 100 UNIT/ML injection Inject 16-43 Units into the skin 2 (two) times daily. 43 units in AM, and 16 in the evening      . metoCLOPramide (REGLAN) 5 MG tablet Take 5 mg by mouth every 6 (six) hours as needed for nausea.      . metoprolol tartrate (LOPRESSOR) 25 MG tablet Take 25 mg by mouth 2 (two) times daily.      . Multiple Vitamin (MULTIVITAMIN WITH MINERALS) TABS tablet Take 1 tablet by mouth daily.      . nitroGLYCERIN (NITROSTAT) 0.4 MG SL tablet Place 0.4 mg under the tongue every 5 (five) minutes as needed. For chest pain      . OxyCODONE (OXYCONTIN) 80 mg T12A 12 hr tablet Take 1 tablet (80 mg total) by mouth every 6 (six) hours.  120 tablet  0  . polyethylene glycol (MIRALAX / GLYCOLAX) packet Take 17 g by mouth daily.      . traZODone  (DESYREL) 150 MG tablet Take 150 mg by mouth at bedtime.       Scheduled: . amLODipine  10 mg Oral Daily  . aspirin EC  81 mg Oral Daily  . atorvastatin  10 mg Oral Daily  . clopidogrel  75 mg Oral Daily  . enoxaparin (LOVENOX) injection  40 mg Subcutaneous Q24H  . finasteride  5 mg Oral BID  . insulin aspart  0-15 Units Subcutaneous TID WC  . insulin aspart  0-5 Units Subcutaneous QHS  . insulin glargine  15 Units Subcutaneous Daily  . metoprolol tartrate  25 mg Oral BID  . multivitamin with minerals  1 tablet Oral Daily  . OxyCODONE  80 mg Oral 3 times per day  . polyethylene glycol  17 g Oral Daily  . traZODone  150 mg Oral QHS    ROS: History obtained from the patient  General ROS: negative for - chills, fatigue, fever, night sweats, weight gain or weight loss Psychological ROS: depression Ophthalmic ROS: negative for - blurry vision, double vision, eye pain or loss of vision ENT ROS: negative for - epistaxis, nasal discharge, oral lesions, sore throat, tinnitus or vertigo Allergy and Immunology ROS: negative for - hives or itchy/watery eyes Hematological and Lymphatic ROS: negative for - bleeding problems, bruising or swollen lymph nodes Endocrine ROS: negative for - galactorrhea, hair pattern changes, polydipsia/polyuria or temperature intolerance Respiratory ROS: negative for - cough, hemoptysis, shortness of breath or wheezing Cardiovascular ROS: negative for - chest pain, dyspnea on exertion, edema or irregular heartbeat Gastrointestinal ROS: negative for - abdominal pain, diarrhea, hematemesis, nausea/vomiting or stool incontinence Genito-Urinary ROS: negative for - dysuria, hematuria, incontinence or urinary frequency/urgency Musculoskeletal ROS: negative for - joint swelling or muscular weakness Neurological ROS: as noted in HPI Dermatological ROS: negative for rash and skin lesion changes  Physical Examination: Blood pressure 135/55, pulse 69, temperature 98.3 F  (36.8 C), temperature source Oral, resp. rate 20, height 6' (1.829 m), weight 79.3 kg (174 lb 13.2 oz), SpO2 95.00%.  Neurologic Examination Mental Status: Alert, oriented, thought content appropriate.  Speech fluent without evidence of aphasia.  Able to follow 3 step commands without difficulty. Cranial Nerves: II: Discs flat bilaterally; Visual fields grossly normal, pupils equal, round, reactive to light and accommodation III,IV, VI: ptosis not present, extra-ocular motions intact bilaterally V,VII: smile symmetric, facial light touch sensation  normal bilaterally VIII: hearing normal bilaterally IX,X: gag reflex present XI: bilateral shoulder shrug XII: midline tongue extension Motor: Right : Upper extremity   5/5    Left:     Upper extremity   5-/5  Lower extremity   5/5     Lower extremity   5/5 with left foot drop Hand grip weak bilaterally Tone and bulk:normal tone throughout; no atrophy noted Sensory: Pinprick and light touch decreased in the lower extremities to the knees Deep Tendon Reflexes: 1+ in the upper extremities and absent in the lower extremities Plantars: Right: mute   Left: mute Cerebellar: normal finger-to-nose on the right, mild dysmetria on the left; dysmetria with heel-to-shin testing bilaterally Gait: not tested CV: pulses palpable throughout     Laboratory Studies:   Basic Metabolic Panel:  Recent Labs Lab 04/20/13 0334  NA 136*  K 4.0  CL 97  CO2 27  GLUCOSE 217*  BUN 11  CREATININE 0.95  CALCIUM 8.9    Liver Function Tests: No results found for this basename: AST, ALT, ALKPHOS, BILITOT, PROT, ALBUMIN,  in the last 168 hours No results found for this basename: LIPASE, AMYLASE,  in the last 168 hours No results found for this basename: AMMONIA,  in the last 168 hours  CBC:  Recent Labs Lab 04/20/13 0334  WBC 6.5  NEUTROABS 4.3  HGB 14.0  HCT 39.5  MCV 93.6  PLT 328    Cardiac Enzymes:  Recent Labs Lab 04/20/13 1000   CKTOTAL 30  CKMB 1.1  TROPONINI <0.30    BNP: No components found with this basename: POCBNP,   CBG:  Recent Labs Lab 04/20/13 1101  GLUCAP 247*    Microbiology: Results for orders placed during the hospital encounter of 04/20/12  SURGICAL PCR SCREEN     Status: None   Collection Time    04/20/12  9:15 AM      Result Value Ref Range Status   MRSA, PCR NEGATIVE  NEGATIVE Final   Staphylococcus aureus NEGATIVE  NEGATIVE Final   Comment:            The Xpert SA Assay (FDA     approved for NASAL specimens     in patients over 12 years of age),     is one component of     a comprehensive surveillance     program.  Test performance has     been validated by [employer] for patients greater     than or equal to 30 year old.     It is not intended     to diagnose infection nor to     guide or monitor treatment.    Coagulation Studies: No results found for this basename: LABPROT, INR,  in the last 72 hours  Urinalysis: No results found for this basename: COLORURINE, APPERANCEUR, LABSPEC, PHURINE, GLUCOSEU, HGBUR, BILIRUBINUR, KETONESUR, PROTEINUR, UROBILINOGEN, NITRITE, LEUKOCYTESUR,  in the last 168 hours  Lipid Panel:  No results found for this basename: chol, trig, hdl, cholhdl, vldl, ldlcalc    HgbA1C:  Lab Results  Component Value Date   HGBA1C  Value: 6.5 (NOTE) The ADA recommends the following therapeutic goal for glycemic control related to Hgb A1c measurement: Goal of therapy: <6.5 Hgb A1c  Reference: American Diabetes Association: Clinical Practice Recommendations 2010, Diabetes Care, 2010, 33: (Suppl  1).* 09/09/2008    Urine Drug Screen:   No results found for this basename: labopia, cocainscrnur, labbenz, amphetmu, thcu,  labbarb    Alcohol Level: No results found for this basename: ETH,  in the last 168 hours  Other results: EKG: sinus rhythm at 81 bpm.  Imaging: Dg Chest 2 View  04/20/2013   CLINICAL DATA:  Chest pain  EXAM: CHEST  2 VIEW   COMPARISON:  12/29/2011  FINDINGS: COPD with hyperinflation. Negative for pneumonia. Negative for heart failure or effusion.  IMPRESSION: COPD.  No acute abnormality.   Electronically Signed   By: Franchot Gallo M.D.   On: 04/20/2013 04:25     Assessment/Plan: 68 year old male with chronic pain.  Clearly he is having difficulty on his current regimen.  He is now connected with a pain clinic which should clearly help him in the long run.  In the meantime though would consider a Fentanyl patch.  This may even out his pain control and decrease the highs and lows that he experiences from multiple dosing of shorter acting medications.    Recommendations: 1.  Would consider start of Fentanyl in the morning at 50ug and use in place of Oxycontin (may further adjust if necessary).  Although may require some prn dosing along with the Fentanyl patch, particularly, in the beginning would consider prn dosing with Ultram instead of Norco.  Would likely not benefit from starting the patch today since he is already had multiple doses today.  Patient not enthusiastic about a medication change and not particularly agreeable at this time-he wishes to just have his current medications increased and continued.    Alexis Goodell, MD [facility] [phone] 04/20/2013, 1:57 PM

## 2013-04-20 NOTE — Progress Notes (Signed)
Pt called with c/o not being able to breath. o2 sat  97% on room air. Refused oxygen. Xanax po given

## 2013-04-21 LAB — GLUCOSE, CAPILLARY
GLUCOSE-CAPILLARY: 252 mg/dL — AB (ref 70–99)
GLUCOSE-CAPILLARY: 235 mg/dL — AB (ref 70–99)
Glucose-Capillary: 168 mg/dL — ABNORMAL HIGH (ref 70–99)
Glucose-Capillary: 122 mg/dL — ABNORMAL HIGH (ref 70–99)

## 2013-04-21 MED ORDER — COLLAGENASE 250 UNIT/GM EX OINT
TOPICAL_OINTMENT | Freq: Every day | CUTANEOUS | Status: DC
  Administered 2013-04-21 – 2013-04-22 (×2): via TOPICAL
  Filled 2013-04-21: qty 30

## 2013-04-21 MED ORDER — OXYCODONE HCL ER 20 MG PO T12A
80.0000 mg | EXTENDED_RELEASE_TABLET | Freq: Two times a day (BID) | ORAL | Status: DC
  Administered 2013-04-21 – 2013-04-22 (×2): 80 mg via ORAL
  Filled 2013-04-21 (×3): qty 4

## 2013-04-21 MED ORDER — OXYCODONE HCL ER 20 MG PO T12A
40.0000 mg | EXTENDED_RELEASE_TABLET | Freq: Every day | ORAL | Status: DC
  Administered 2013-04-21: 40 mg via ORAL
  Filled 2013-04-21: qty 2

## 2013-04-21 NOTE — Progress Notes (Signed)
Inpatient Diabetes Program Recommendations  AACE/ADA: New Consensus Statement on Inpatient Glycemic Control (2013)  Target Ranges:  Prepandial:   less than 140 mg/dL      Peak postprandial:   less than 180 mg/dL (1-2 hours)      Critically ill patients:  140 - 180 mg/dL   Reason for Assessment:Results for IYAD, DEROO (MRN [id number]) as of 04/21/2013 12:56  Ref. Range 04/20/2012 13:06 04/20/2013 11:01 04/20/2013 15:57 04/21/2013 06:32 04/21/2013 11:16  Glucose-Capillary Latest Range: 70-99 mg/dL 107 (H) 247 (H) 175 (H) 168 (H) 252 (H)   Referral received.  Reviewed patient's CBG's and medications.  Diabetes history: Diabetes: insulin dependent Outpatient Diabetes medications: NPH 43 units in the AM and 16 units it the PM Current orders for Inpatient glycemic control: Currently on Lantus 15 units daily and moderate Novolog correction tid with meals  Consider increasing Lantus to 25 units daily and add Novolog meal coverage 4 units tid with meals.   Will follow. Thanks, Adah Perl, RN, BC-ADM Inpatient Diabetes Coordinator Pager [phone]

## 2013-04-21 NOTE — Evaluation (Signed)
Physical Therapy Evaluation Patient Details Name: [name] MRN: [id number] DOB: 07/14/1945 Today's Date: 04/21/2013 Time: 6606-3016 PT Time Calculation (min): 14 min  PT Assessment / Plan / Recommendation History of Present Illness  Patient is a 68 yo male with h/o chronic neuropathic pain, admitted with chest pain, SOB, and opiate withdrawal.  Clinical Impression  Patient presents with problems listed below.  Will benefit from acute PT to maximize independence prior to discharge home with wife.  Recommend HHPT at discharge for continued therapy.    PT Assessment  Patient needs continued PT services    Follow Up Recommendations  Home health PT;Supervision/Assistance - 24 hour    Does the patient have the potential to tolerate intense rehabilitation      Barriers to Discharge        Equipment Recommendations  None recommended by PT    Recommendations for Other Services     Frequency Min 3X/week    Precautions / Restrictions Precautions Precautions: Fall Restrictions Weight Bearing Restrictions: No   Pertinent Vitals/Pain Pain 7/10 in LE's, impacting mobility.      Mobility  Bed Mobility Overal bed mobility: Modified Independent General bed mobility comments: Able to move supine <> sit with use of bedrail.  Patient reports he sleeps in recliner at home. Transfers Overall transfer level: Needs assistance Equipment used: Rolling walker (2 wheeled) Transfers: Sit to/from Stand Sit to Stand: Supervision General transfer comment: Verbal cues for hand placement.  Supervision for safety/balance initially on standing. Ambulation/Gait Ambulation/Gait assistance: Supervision Ambulation Distance (Feet): 34 Feet Assistive device: Rolling walker (2 wheeled) Gait Pattern/deviations: Step-through pattern;Decreased step length - right;Decreased step length - left;Shuffle;Trunk flexed Gait velocity: Slow gait speed Gait velocity interpretation: Below normal speed for  age/gender General Gait Details: Verbal cues to stand upright and stay close to RW.  Noted decreased dorsiflexion on Lt during gait.  Patient with dyspnea 2/4 following gait.    Exercises     PT Diagnosis: Difficulty walking;Abnormality of gait;Generalized weakness;Acute pain  PT Problem List: Decreased strength;Decreased activity tolerance;Decreased balance;Decreased mobility;Decreased knowledge of use of DME;Cardiopulmonary status limiting activity;Pain PT Treatment Interventions: DME instruction;Gait training;Functional mobility training;Patient/family education     PT Goals(Current goals can be found in the care plan section) Acute Rehab PT Goals Patient Stated Goal: To decrease pain PT Goal Formulation: With patient/family Time For Goal Achievement: 04/28/13 Potential to Achieve Goals: Fair  Visit Information  Last PT Received On: 04/21/13 Assistance Needed: +1 History of Present Illness: Patient is a 68 yo male with h/o chronic neuropathic pain, admitted with chest pain, SOB, and opiate withdrawal.       Prior [place] expects to be discharged to:: Private residence Living Arrangements: Spouse/significant other;Children Available Help at Discharge: Family;Available 24 hours/day Type of Home: House Home Access: Stairs to enter [employer] of Steps: 4 Entrance Stairs-Rails: Right;Left Home Layout: One level Home Equipment: Walker - 2 wheels;Cane - single point;Bedside commode;Shower seat Prior Function Level of Independence: Independent with assistive device(s);Needs assistance Gait / Transfers Assistance Needed: Uses RW for ambulation in house ADL's / Homemaking Assistance Needed: Assist with bathing, dressing, and meal prep Communication Communication: No difficulties    Cognition  Cognition Arousal/Alertness: Awake/alert Behavior During Therapy: Flat affect;Anxious Overall Cognitive Status: Within Functional Limits for  tasks assessed General Comments: Patient with decreased judgement/insight related to overall health as evidenced by overuse of pain meds.    Extremity/Trunk Assessment Upper Extremity Assessment Upper Extremity Assessment: Generalized weakness Lower Extremity Assessment  Lower Extremity Assessment: Generalized weakness;LLE deficits/detail RLE Sensation: history of peripheral neuropathy LLE Deficits / Details: Decreased strength to 4-/5 with dorsiflexion at 2/5. LLE Sensation: history of peripheral neuropathy Cervical / Trunk Assessment Cervical / Trunk Assessment: Kyphotic   Balance    End of Session PT - End of Session Equipment Utilized During Treatment: Gait belt Activity Tolerance: Patient limited by pain;Patient limited by fatigue Patient left: in bed;with call bell/phone within reach;with family/visitor present Nurse Communication: Mobility status  GP     Despina Pole 04/21/2013, 5:45 PM Carita Pian. Sanjuana Kava, [place] Pager [phone]

## 2013-04-21 NOTE — Progress Notes (Signed)
Pt ambulating in room with walker. Verbalizes displeasure with med changes before changes have begun. States current pain med regime not enough to control the pain

## 2013-04-21 NOTE — Consult Note (Signed)
WOC wound consult note Reason for Consult: Ulcers to left clavicle and left mandibular area. Patient head slumped to left when RN entered room and he indicated that he has had a stroke and his head is always like this.  Is able to lift head and allow me to assess wound. 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  Pressure Ulcer POA: Yes Measurement: Left clavicle:  1 cm x 2 cm x 0.2 cm.  100% pale pink wound bed.  LEft mandibular:  1 cm x 2.2 cm 100% scabbed lesion.  Patient has a full beard and is agreeable to a shave to acomodate wound care.   Drainage (amount, consistency, odor)  None noted although left mandibular scabbed lesions feels fluctuant.  Periwound: Intact Dressing procedure/placement/frequency: Cleanse left clavicle wound with NS and pat gently dry.  Apply Mepitel silicone contact layer to wound bed.  Top with dry 4x4 and secure with tape. Shave face.  CLeanse left mandibular wound with NS and pat gently dry.  Apply Santyl ointment to wound bed, top with NS moist dressing and secure with 4x4 and tape. CHange daily.  Will not follow at this time.  Please re-consult if needed.  Domenic Moras RN BSN [place] Pager [phone]

## 2013-04-21 NOTE — Progress Notes (Signed)
Subjective: Feeling better than 24 hrs ago. No CP. Less stuffy and congested. No HA. We discussed QOL. He says he does not walk but he does OK in his room getting up and getting to bathroom. I told him that b/c he used an extra 20-25 Oxycontin and an extra 20-25 hydrocodone in a short time fram he is lucky he is alive and that he was abusing the med.   I told him the best place for him may be an AL - he got upset with me and stated that he does not want to be away from his family. I inquired @ a Music therapist or motorized wheelchair and he states his house is not configured for that.  Objective: Vital signs in last 24 hours: Temp:  [97.4 F (36.3 C)-98.3 F (36.8 C)] 98.3 F (36.8 C) (03/15 0433) Pulse Rate:  [54-93] 54 (03/15 0433) Resp:  [14-20] 20 (03/15 0433) BP: (119-156)/(53-76) 127/53 mmHg (03/15 0433) SpO2:  [94 %-98 %] 95 % (03/15 0433) Weight:  [79.3 kg (174 lb 13.2 oz)-80.423 kg (177 lb 4.8 oz)] 80.423 kg (177 lb 4.8 oz) (03/15 0433) Weight change: -6.883 kg (-15 lb 2.8 oz) Last BM Date: 04/19/13  CBG (last 3)   Recent Labs  04/20/13 1101 04/20/13 1557 04/21/13 0632  GLUCAP 247* 175* 168*    Intake/Output from previous day:  Intake/Output Summary (Last 24 hours) at 04/21/13 0757 Last data filed at 04/21/13 0610  Gross per 24 hour  Intake   1612 ml  Output    650 ml  Net    962 ml   03/14 0701 - 03/15 0700 In: [id number] [P.O.:930; I.V.:682] Out: 650 [Urine:650]   Physical Exam  General appearance: Scruffy Bearded gentleman. Neck held to R and a little stiff. Small friction irritation in L beard area and wound on L Clavicle - No drainage.  Eyes: conjunctivae/corneas clear. PERRL, EOM's intact.  Nose: Mouth moist. Tounge deviates slightly to R  Neck: no adenopathy, no carotid bruit, no JVD and thyroid not enlarged, symmetric, no tenderness/mass/nodules  Resp: CTA B  Cardio: Reg c slight m  GI: soft, non-tender; bowel sounds normal; no masses, no organomegaly   Extremities: extremities normal, atraumatic, no cyanosis or edema. Tender to touch over shin areas and feet. Charcot feet Lymph nodes: no cervical lymphadenopathy  Neurologic: Alert and oriented X 3, better movement than expected. Slightly weak grip strngth on L - he says it is stable.     Lab Results:  Recent Labs  04/20/13 0334  NA 136*  K 4.0  CL 97  CO2 27  GLUCOSE 217*  BUN 11  CREATININE 0.95  CALCIUM 8.9    No results found for this basename: AST, ALT, ALKPHOS, BILITOT, PROT, ALBUMIN,  in the last 72 hours   Recent Labs  04/20/13 0334  WBC 6.5  NEUTROABS 4.3  HGB 14.0  HCT 39.5  MCV 93.6  PLT 328    Lab Results  Component Value Date   INR 0.94 04/20/2012   INR 0.94 12/29/2011   INR 1.0 09/20/2008     Recent Labs  04/20/13 1000 04/20/13 1629 04/20/13 2100  CKTOTAL 30  --   --   CKMB 1.1  --   --   TROPONINI <0.30 <0.30 <0.30    No results found for this basename: TSH, T4TOTAL, FREET3, T3FREE, THYROIDAB,  in the last 72 hours  No results found for this basename: VITAMINB12, FOLATE, FERRITIN, TIBC, IRON, RETICCTPCT,  in the last  72 hours  Micro Results: No results found for this or any previous visit (from the past 240 hour(s)).   Studies/Results: Dg Chest 2 View  04/20/2013   CLINICAL DATA:  Chest pain  EXAM: CHEST  2 VIEW  COMPARISON:  12/29/2011  FINDINGS: COPD with hyperinflation. Negative for pneumonia. Negative for heart failure or effusion.  IMPRESSION: COPD.  No acute abnormality.   Electronically Signed   By: Franchot Gallo M.D.   On: 04/20/2013 04:25     Medications: Scheduled: . amLODipine  10 mg Oral Daily  . aspirin EC  81 mg Oral Daily  . atorvastatin  10 mg Oral Daily  . clopidogrel  75 mg Oral Daily  . enoxaparin (LOVENOX) injection  40 mg Subcutaneous Q24H  . finasteride  5 mg Oral BID  . insulin aspart  0-15 Units Subcutaneous TID WC  . insulin aspart  0-5 Units Subcutaneous QHS  . insulin glargine  15 Units  Subcutaneous Daily  . metoprolol tartrate  25 mg Oral BID  . multivitamin with minerals  1 tablet Oral Daily  . OxyCODONE  80 mg Oral 3 times per day  . polyethylene glycol  17 g Oral Daily  . traZODone  150 mg Oral QHS   Continuous: . sodium chloride 40 mL/hr at 04/20/13 1307     Assessment/Plan: Active Problems:   DM   HYPERTENSION   CAD   Polyneuropathy in diabetes(357.2)   Multiple sclerosis   Chest pain   Late effects of cerebrovascular accident   Narcotic Withdrawal has improved now that he is back on Narcotics. He shows no interest in trying to have a better QOL. If he goes home still on the Narcotics he will need someone else to administer them to him as clearly he cannot be trusted. He does not appear to understand that what he did was ridiculous and is putting his medical doctors in a bind. We want to treat his pain but do not want him to be an addict. I have him on 3 times per day Oxycontin and I feel that is too much.  I will lower one of the dosing to 40 mg.  Dr A and Neurology can wean more from here. He also can have up to 3 Norco per day. I think that Dr Regenia Skeeter recommendation of Fentanyl is a good one. The pt is in the process of of seeing Dr Hardin Negus and discussing some alternate treatments. Getting him off the Narcotics completely would be a very difficult endevour as he has terrible pains. I am assuming he has tried Lyrica/Neurontin etc.   Depression c recent increased emotionality b/c of both parents dying.  Continue trazodone and Xanax added.  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  Non-Cardiac CP in pt c h/o CAD - s/p MI in 2006 - Enzymes ruled out MI.  Appreciate Cards input.  Diabetic peripheral neuropathy - Appreciate Neuro input.  L jaw and L Clavicle wound - Wound care consulted.  Keep pressure off. cerebrovascular disease - RF reduction the best we can do,  Lipids on Lipitor.  prior history of multiple sclerosis  gait disorder - PT/OT ordered.    AFTT - The patient is using a walker for ambulation. Barely leaves the house. He is stressing out his wife. He has what sounds like a very poor QOL. PT/OT/SW ordered.  His situation is pitiful and something needs to change.  Diabetes Mellitus - We switched N to Lantus and SSI. Monitor and  Manage -  Recent Labs  04/20/13 1101 04/20/13 1557 04/21/13 0632  GLUCAP 247* 175* 168*   DVT Prophylaxis - Lovenox.    LOS: 1 day   Hollis Tuller M 04/21/2013, 7:57 AM

## 2013-04-22 ENCOUNTER — Other Ambulatory Visit: Payer: Self-pay | Admitting: Neurology

## 2013-04-22 ENCOUNTER — Telehealth: Payer: Self-pay | Admitting: Neurology

## 2013-04-22 ENCOUNTER — Other Ambulatory Visit: Payer: Self-pay

## 2013-04-22 LAB — GLUCOSE, CAPILLARY
GLUCOSE-CAPILLARY: 143 mg/dL — AB (ref 70–99)
Glucose-Capillary: 147 mg/dL — ABNORMAL HIGH (ref 70–99)
Glucose-Capillary: 268 mg/dL — ABNORMAL HIGH (ref 70–99)

## 2013-04-22 MED ORDER — COLLAGENASE 250 UNIT/GM EX OINT: TOPICAL_OINTMENT | Freq: Every day | CUTANEOUS | Status: DC

## 2013-04-22 MED ORDER — OXYCODONE HCL ER 80 MG PO T12A: 80.0000 mg | EXTENDED_RELEASE_TABLET | Freq: Two times a day (BID) | ORAL | Status: DC

## 2013-04-22 MED ORDER — OXYCODONE HCL ER 80 MG PO T12A: 80.0000 mg | EXTENDED_RELEASE_TABLET | Freq: Four times a day (QID) | ORAL | Status: DC

## 2013-04-22 MED ORDER — HYDROCODONE-ACETAMINOPHEN 10-325 MG PO TABS: 1.0000 | ORAL_TABLET | Freq: Every day | ORAL | Status: DC

## 2013-04-22 NOTE — Progress Notes (Signed)
Lengthy discussion with patient regarding events, home life, narcotic use.  Not suicidal and clearly has capacity.  Medically stable and declines placement.  Will call [facility] for plan regarding narcotics.

## 2013-04-22 NOTE — Progress Notes (Signed)
Inpatient Diabetes Program Recommendations  AACE/ADA: New Consensus Statement on Inpatient Glycemic Control (2013)  Target Ranges:  Prepandial:   less than 140 mg/dL      Peak postprandial:   less than 180 mg/dL (1-2 hours)      Critically ill patients:  140 - 180 mg/dL   Results for CHANNIN, AGUSTIN (MRN [id number]) as of 04/22/2013 09:48  Ref. Range 04/21/2013 06:32 04/21/2013 11:16 04/21/2013 16:13 04/21/2013 21:57 04/22/2013 06:05  Glucose-Capillary Latest Range: 70-99 mg/dL 168 (H) 252 (H) 122 (H) 235 (H) 143 (H)   Diabetes history: Diabetes: DM2 Outpatient Diabetes medications: NPH 43 units in the AM and 16 units it the PM   Current orders for Inpatient glycemic control: Currently on Lantus 15 units daily, Novolog 0-15 units AC, Novolog 0-5 units HS   Inpatient Diabetes Program Recommendations Insulin - Basal: Fasting 168 mg/dl on 3/15 and 143 mg/dl this morning.  Therefore, recommend continuing Lantus 15 units daily as ordered. Insulin - Meal Coverage: Please consider ordering Novolog 5 units TID with meals for meal coverage.  Thanks, Barnie Alderman, RN, MSN, CCRN Diabetes Coordinator Inpatient Diabetes Program [phone] (Team Pager) [phone] ([facility] office) [phone] [facility] office)

## 2013-04-22 NOTE — Evaluation (Signed)
Occupational Therapy Evaluation Patient Details Name: [name] MRN: [id number] DOB: 13-Jul-1945 Today's Date: 04/22/2013 Time: 4854-6270 OT Time Calculation (min): 36 min  OT Assessment / Plan / Recommendation History of present illness Patient is a 68 yo male with h/o chronic neuropathic pain, admitted with chest pain, SOB, and opiate withdrawal.   Clinical Impression   Pt is significantly limited by his pain level in bilateral LEs and only has limited tolerance in standing for tasks. Discussed at length pain management strategies as well as energy conservation techniques. Feel he would benefit from continued OT services to help reinforce these techniques with pt and increase independence for d/c home with wife.     OT Assessment  Patient needs continued OT Services    Follow Up Recommendations  Home health OT;Supervision/Assistance - 24 hour;Other (comment) ([place] aide if possible)    Barriers to Discharge      Equipment Recommendations  None recommended by OT    Recommendations for Other Services    Frequency  Min 2X/week    Precautions / Restrictions Precautions Precautions: Fall Restrictions Weight Bearing Restrictions: No   Pertinent Vitals/Pain 5/10 at start of session 10/10 end of session bilateral LE Informed nursing, elevation of LEs   ADL  Eating/Feeding: Independent Where Assessed - Eating/Feeding: Bed level Grooming: Teeth care;Min guard (pt got progressively more shaky as he stood longer) Where Assessed - Grooming: Unsupported standing Upper Body Bathing: Chest;Right arm;Left arm;Abdomen;Set up Where Assessed - Upper Body Bathing: Unsupported sitting Lower Body Bathing: Minimal assistance (to finish washing/drying periareas as pt hurting 10/10 in LEs) Where Assessed - Lower Body Bathing: Supported sit to stand Upper Body Dressing: Set up Where Assessed - Upper Body Dressing: Unsupported sitting Lower Body Dressing: Minimal assistance Where Assessed -  Lower Body Dressing: Supported sit to stand Toilet Transfer: Min guard (verbal cues for hand placement. from EOB to sink to bed again) Toilet Transfer Method: Sit to stand Toileting - Water quality scientist and Hygiene: Min guard Where Assessed - Toileting Clothing Manipulation and Hygiene: Sit to stand from 3-in-1 or toilet Equipment Used: Rolling walker ADL Comments: Pt only able to stand for about 2 minutes at the sink before LEs start to get shaky and pt reports significant pain. Pt states it was this way at home also (not new). Pt needed min assist to finish standing ADL at EOB with washing/drying as pain increasing to 10/10 and pt more shaky.  Had long discussion with pt regarding ADL routine at home and pt states his wife is at home but doesnt help much with the ADL tasks. States "it isnt her sort of thing." He clearly has difficulty with tasks due to his bilateral LE pain level with increased time on his feet. He states he also fatigues after a shower on tubbench at home and has a hard time gettting up from the bench and proceeding with getting dressed. Discussed importance of taking more frequent rest breaks and breaking up tasks. Ie to sit on edge of tubbench and rest before getting up and option to don a bathrobe instead of his clothes before getting up from bench and then transferring over to a chair or 3in1 to dress once he has rested more. Pt verbalizes understanding of these energy conservation principles. Discussed also the option of a HH aide to help initially at home.     OT Diagnosis: Generalized weakness;Acute pain  OT Problem List: Decreased strength;Decreased knowledge of use of DME or AE;Decreased activity tolerance;Pain OT Treatment Interventions:  Stauffer-care/ADL training;Patient/family education;Therapeutic activities;DME and/or AE instruction   OT Goals(Current goals can be found in the care plan section) Acute Rehab OT Goals Patient Stated Goal: To decrease pain OT Goal  Formulation: With patient Time For Goal Achievement: 05/06/13 Potential to Achieve Goals: Good  Visit Information  Last OT Received On: 04/22/13 Assistance Needed: +1 History of Present Illness: Patient is a 68 yo male with h/o chronic neuropathic pain, admitted with chest pain, SOB, and opiate withdrawal.       Prior [place] expects to be discharged to:: Private residence Living Arrangements: Spouse/significant other;Children Available Help at Discharge: Family;Available 24 hours/day Type of Home: House Home Access: Stairs to enter [employer] of Steps: 4 Entrance Stairs-Rails: Right;Left Home Layout: One level Home Equipment: Walker - 2 wheels;Cane - single point;Bedside commode;Shower seat Prior Function Level of Independence: Independent with assistive device(s);Needs assistance Gait / Transfers Assistance Needed: Uses RW for ambulation in house ADL's / Homemaking Assistance Needed: Assist with bathing, dressing, and meal prep. Per conversation 3/16, pt states that he normally struggles through his ADL routine because helping with Puff care "isnt her thing" referring to his wife. Communication Communication: No difficulties         Vision/Perception     Cognition  Cognition Arousal/Alertness: Awake/alert Behavior During Therapy: WFL for tasks assessed/performed Overall Cognitive Status: Within Functional Limits for tasks assessed    Extremity/Trunk Assessment Upper Extremity Assessment Upper Extremity Assessment: Generalized weakness;LUE deficits/detail LUE Coordination: decreased fine motor     Mobility Bed Mobility Overal bed mobility: Modified Independent Transfers Overall transfer level: Needs assistance Equipment used: Rolling walker (2 wheeled) Transfers: Sit to/from Stand Sit to Stand: Min guard General transfer comment: verbal cues for hand placement and closer guard as pt becomes more fatigued.      Exercise     Balance General Comments General comments (skin integrity, edema, etc.): stood to brush teeth and wash periareas with very close min guard assist as LEs become very shaky   End of Session OT - End of Session Equipment Utilized During Treatment: Rolling walker Activity Tolerance: Patient limited by pain Patient left: in bed;with call bell/phone within reach  [place], [place] [phone] 04/22/2013, 10:58 AM

## 2013-04-22 NOTE — Discharge Summary (Signed)
DISCHARGE SUMMARY  [name]  MR#: [id number]  DOB:September 04, 1945  Date of Admission: 04/20/2013 Date of Discharge: 04/22/2013  Attending Physician:Janiyla Long A  [name] HW:[id number] A, MD  Consults:Treatment Team:  Catarina Hartshorn, MD  Discharge Diagnoses: Active Problems:   DM   HYPERTENSION   CAD   Polyneuropathy in diabetes(357.2)   Multiple sclerosis   Chest pain   Late effects of cerebrovascular accident    Narcotic withdrawal   Discharge Medications:   Medication List         amLODipine 10 MG tablet  Commonly known as:  NORVASC  Take 10 mg by mouth daily.     aspirin 81 MG tablet  Take 81 mg by mouth daily.     atorvastatin 10 MG tablet  Commonly known as:  LIPITOR  Take 10 mg by mouth daily.     clopidogrel 75 MG tablet  Commonly known as:  PLAVIX  Take 75 mg by mouth daily.     collagenase ointment  Commonly known as:  SANTYL  Apply topically daily.     finasteride 5 MG tablet  Commonly known as:  PROSCAR  Take 5 mg by mouth 2 (two) times daily.     HYDROcodone-acetaminophen 10-325 MG per tablet  Commonly known as:  NORCO  Take 1 tablet by mouth 5 (five) times daily.     insulin NPH Human 100 UNIT/ML injection  Commonly known as:  HUMULIN N,NOVOLIN N  Inject 16-43 Units into the skin 2 (two) times daily. 43 units in AM, and 16 in the evening     metoCLOPramide 5 MG tablet  Commonly known as:  REGLAN  Take 5 mg by mouth every 6 (six) hours as needed for nausea.     metoprolol tartrate 25 MG tablet  Commonly known as:  LOPRESSOR  Take 25 mg by mouth 2 (two) times daily.     multivitamin with minerals Tabs tablet  Take 1 tablet by mouth daily.     nitroGLYCERIN 0.4 MG SL tablet  Commonly known as:  NITROSTAT  Place 0.4 mg under the tongue every 5 (five) minutes as needed. For chest pain     OxyCODONE 80 mg T12a 12 hr tablet  Commonly known as:  OXYCONTIN  Take 1 tablet (80 mg total) by mouth 2 (two) times daily.      polyethylene glycol packet  Commonly known as:  MIRALAX / GLYCOLAX  Take 17 g by mouth daily.     traZODone 150 MG tablet  Commonly known as:  DESYREL  Take 150 mg by mouth at bedtime.        Hospital Procedures: Dg Chest 2 View  04/20/2013   CLINICAL DATA:  Chest pain  EXAM: CHEST  2 VIEW  COMPARISON:  12/29/2011  FINDINGS: COPD with hyperinflation. Negative for pneumonia. Negative for heart failure or effusion.  IMPRESSION: COPD.  No acute abnormality.   Electronically Signed   By: Franchot Gallo M.D.   On: 04/20/2013 04:25    History of Present Illness: *41 Male pt of Dr A. He is on Oxycontin 80 5 times per day and up to 5 Hydrocodone/APAP 10/325 per day as Rxed per Dr Floyde Parkins but he has had one OV c Dr Hardin Negus at the pain center. They started working on a plan to adjust these meds. They have appt in April and will see Dr Myles Rosenthal after that. Lately he has been running out of Narcs early before he can refill them. He reports his  LE and UE Neuropathic pains are not controlled on current regimen. This past month he started taking extra doses of the Narcs as he has been having increasing pains from the emotional issues.  Dad of 71 just died of a MI 5 weeks ago  Mom (Alzheimers) died 13 days later and was buried yesterday.  His next Rx is due to be picked up from Dr Jannifer Franklin office Mon-Tuesday.  He has been diaphoretic and has been experiencing insomnia and severe aching pain throughout his body. His mouth feels dry. No N/V. Feels better post Narcs.  CAD - s/p MI in 2006 followed by PCA. He presents with complaints of chest pain which started @ 1 am. He was at rest. Felt like a stab in chest. Got more SOB and was scared. Pain was severe, aching, non-radiating. It resolved when treated with NTG SL administered by EMS. Patient experienced about 1 hour of pain. He denies history of similar sx. His MI in 2006 was accompanied by back pain. The SOB resolved with resolution of chest pain. In ED no  EKG Changes. (-) Trop I. Now wearing NTG Patch. No current CP.  He had recent Narcotic related impaction as well. Now on Miralax and often enemas.  He does not leave house. He is on walker in house. He is homebound. He was unable to get to his parents funeral. Moving causes pain. They have trouble keeping up with his hygiene. He has a wound in his L beard and a wound over L clavicle.  Last Cards was Dr Lia Foyer  In ED they just gave him Nasal Afrin.      Hospital Course: Patient was noted to the medical service and after fairly thorough evaluation to include cardiology consult was felt that the bulk of his symptoms were narcotic withdrawal.  He was stabilized on some OxyContin and some when necessary hydrocodone.  He was seen by the neuro hospitalist.  I did discuss at length as well with Dr. Jannifer Franklin a plan of Dr. Jannifer Franklin will be following through with patient and family this afternoon.  Dr. Jannifer Franklin will be providing prescriptions says have not been part of any of this prescribing and he is not yet been seen are actively prescribed by the pain clinic.  Dr. Jannifer Franklin does understand that this currently his responsibility and at least at this point patient is medically stable remainder which must be managed in the outpatient setting.  At the time of the evaluation this morning he was calm and collected.  I did offer a referral for rehabilitation at one of the skilled nursing facilities and patient adamantly refused.  I did explain that his current utilization of medications is excessive and he must follow prescribing habits of Dr. Jannifer Franklin.  He is discharged stable from further in the outpatient setting.  As I described to patient why no well on neb been a part of this pain medication for regimen and this is solely directed by Dr. Jannifer Franklin.  Day of Discharge Exam BP 114/50  Pulse 54  Temp(Src) 97.4 F (36.3 C) (Oral)  Resp 16  Ht 6' (1.829 m)  Wt 79.742 kg (175 lb 12.8 oz)  BMI 23.84 kg/m2  SpO2  94%  Physical Exam: General appearance: alert and cooperative Eyes: no scleral icterus Throat: oropharynx moist without erythema Resp: clear to auscultation bilaterally Cardio: regular rate and rhythm, S1, S2 normal, no murmur, click, rub or gallop Extremities: no clubbing, cyanosis or edema Patient is calm prior cortical functioning intact clearly  not suicidal sad about his current condition diffusely weak but at baseline Please see the wound care nurse consult where the wounds are nicely outlined nothing surgical needed  Discharge Labs:  Recent Labs  04/20/13 0334  NA 136*  K 4.0  CL 97  CO2 27  GLUCOSE 217*  BUN 11  CREATININE 0.95  CALCIUM 8.9   No results found for this basename: AST, ALT, ALKPHOS, BILITOT, PROT, ALBUMIN,  in the last 72 hours  Recent Labs  04/20/13 0334  WBC 6.5  NEUTROABS 4.3  HGB 14.0  HCT 39.5  MCV 93.6  PLT 328    Recent Labs  04/20/13 1000 04/20/13 1629 04/20/13 2100  CKTOTAL 30  --   --   CKMB 1.1  --   --   TROPONINI <0.30 <0.30 <0.30   No results found for this basename: TSH, T4TOTAL, FREET3, T3FREE, THYROIDAB,  in the last 72 hours No results found for this basename: VITAMINB12, FOLATE, FERRITIN, TIBC, IRON, RETICCTPCT,  in the last 72 hours  Discharge instructions:     Discharge Orders   Future Appointments Provider Department Dept Phone   06/06/2013 3:30 PM Kathrynn Ducking, MD [facility] [phone]   Future Orders Complete By Expires   Diet - low sodium heart healthy  As directed    Discharge instructions  As directed    Comments:     Dr.Willis spoken with--he will be providing narcotic scripts, to pick-up at his office today, take as prescribed   Increase activity slowly  As directed       Disposition: Dr. Tobey Grim office this afternoon to pick up his narcotic prescription says he is completely out and this is the provider prescribing  Follow-up Appts: As determined by Dr. Jannifer Franklin Condition  on Discharge: improved and stable condition guarded prognosis because of the neck chronic use  Tests Needing Follow-up: none  Signed: Myleah Cavendish A 04/22/2013, 1:50 PM

## 2013-04-22 NOTE — Telephone Encounter (Signed)
[name] wife stop by the office to pick patient's Rx up. °

## 2013-04-22 NOTE — Telephone Encounter (Signed)
I will write a prescription for hydrocodone. They have seen Nicholaus Bloom, and are to go back at some point to discuss a motor treatment for the peripheral neuropathy. Dr. Hardin Negus had indicated that the patient is on a dose of opiate medications that is too high for the morphine pump to help him. The patient may benefit from a spinal stimulator.

## 2013-04-22 NOTE — Progress Notes (Signed)
UR completed Francyne Arreaga K. Jesaiah Fabiano, RN, BSN, MSHL, CCM  04/22/2013 1:50 PM

## 2013-04-22 NOTE — Telephone Encounter (Signed)
[name] apparently was admitted recently with sudden withdrawal from opiate medications. The patient had taken too many of his pain medications, and ran out early. The patient has been set to see Dr. Nicholaus Bloom, but it is not clear what has been done about this evaluation. The patient was sent for a possible evaluation for a spinal stimulator for his peripheral neuropathy pain. I will write another prescription for the pain medication today.

## 2013-04-22 NOTE — Telephone Encounter (Signed)
Pt's wife called and stated that when she comes to pick up the prescription for her husband's Oxycontin she also gets a prescription for the [place] for his break thru pain. She did not get this today when she picked up his prescriptions.  Please reference messages from 04-22-13 and 04-19-13.  There are also hospital notes from 04-20-13 that need to be reviewed as well.  Pt's wife didn't want to drive all the way home only to have to come back for the [facility] prescription, but she says she will if she has to.  Please call as soon as possible.  Thank you

## 2013-04-22 NOTE — Telephone Encounter (Signed)
Patient is also requesting Rx for Norco.  Please advise.  Thank you.

## 2013-04-22 NOTE — Progress Notes (Addendum)
Patient discharged to home with wife.  IV removed prior to discharge; IV site clean, dry, and intact.  Discharge education, information, and medications discussed with patient prior to discharge; patient and wife voiced understanding of discharge instructions.  [name] wife had picked up pain medicine prescription at patient's neurologist office prior to patient being discharged at Dr. Jacquiline Doe instruction.

## 2013-04-22 NOTE — Telephone Encounter (Signed)
[name] wife stop by the office to pick patient's Rx up.

## 2013-04-22 NOTE — Care Management Note (Addendum)
  Page 2 of 2   04/22/2013     3:48:36 PM   CARE MANAGEMENT NOTE 04/22/2013  Patient:  [name], [name]   Account Number:  [id number]  Date Initiated:  04/22/2013  Documentation initiated by:  Mariann Laster  Subjective/Objective Assessment:   Chest Pain     Action/Plan:   CM to follow for disposition needs   Anticipated DC Date:  04/23/2013   Anticipated DC Plan:  HOME/[facility]         Choice offered to / List presented to:             Status of service:  Completed, signed off Medicare Important Message given?   (If response is "NO", the following Medicare IM given date fields will be blank) Date Medicare IM given:   Date Additional Medicare IM given:    Discharge Disposition:  HOME/[facility]  Per UR Regulation:  Reviewed for med. necessity/level of care/duration of stay  If discussed at [place] of Stay Meetings, dates discussed:    Comments:  04/22/2013 IP Review Hx/ called to Dr. Loni Muse Presented with CP, SOB, Opiate w/d for MS pain mgmt via ambulance. Narcotic Withdrawal (c Sxs of diaphoresis, insomnia, anxiety, dry mouth and nares, and severe aching pain throughout his body) from finishing his Complicated regimen early Increased stress d/t the loss of both parents over the past month Hx/o Oxycontin 80 5 times per day and up to 5 Hydrocodone/APAP 10/325 per day as Rxed per Dr Floyde Parkins for Neuropathic pains. Hx/o CAD, MI in 2006, a-fib , MS,  Hx/o wound in his L beard and a wound over L clavicle. Chest pain relieved with NTG CXR: IMPRESSION: COPD.  No acute abnormality. ECG:  SR 81  Nonspecific ST T wave changes IV Morphine x 2 doses. Psycho/Social: Hx/o Dad of 74 just died of a MI 5 weeks ago Mom (Alzheimers) died 56 days later and was buried yesterday. Hx/o MS, does not leave house.  Homebound and ambulates via walker in house.  Pt was unable to get to his parents funeral.  Moving causes pain.  They have trouble keeping up with his hygiene.  Psycho/Social:   Lives at home with wife who recently retired and son.  States wife prepares one meal / day and eats small meals other times of day.  States son and wife are non-supportive; and will not assist with bath, washing hair.  Member admits to being lonely and feeling blue. Patient states his biggest problem is managing his painful neuropathy but he hates he has been overusing narcotics for this need and is working on this problem. CM encouraged to discuss s/s of depression with Dr. Reynaldo Minium on next appt as many meds for depression can also help manage s/s of neuropathy. Wound Left neck - states d/t head leaning to the left from residual weakness of stroke. Transportation:  provided by wife and son. PCP:  Dr. Reynaldo Minium Specialist:  Dr. Jannifer Franklin for MS MGMT and pain mgmt DME: walker, and shower bench in the home. HHS:  RN, PT, OT, CNA [facility] notified)  Wound Left neck ADD:  today 04/22/2013 [name] RN, BSN, [place], CCM  04/22/2013

## 2013-04-23 NOTE — Telephone Encounter (Signed)
[name] wife was notified that the rx is ready for pickup at the front desk and was also advised to call the office with any questions or concerns.

## 2013-04-24 NOTE — Clinical Social Work Psychosocial (Addendum)
Clinical Social Work Department BRIEF PSYCHOSOCIAL ASSESSMENT 04/22/2013  Patient:  [name], [name]     Account Number:  [id number]     Admit date:  04/20/2013  Clinical Social Worker:  Iona Coach  Date/Time:  04/22/2013 12:15 PM  Referred by:  Physician  Date Referred:  04/22/2013 Referred for  SNF Placement   Other Referral:   Interview type:  Patient Other interview type:    PSYCHOSOCIAL DATA Living Status:  WIFE Admitted from facility:   Level of care:   Primary support name:  [name]  [place]   [phone] Primary support relationship to patient:  SPOUSE Degree of support available:   Strong support from wife and son    CURRENT CONCERNS Current Concerns  Other - See comment   Other Concerns:   Patient states that he has taken too many of his prescribed pain pills due to chronic pain issues    SOCIAL WORK ASSESSMENT / PLAN CSW met with patient today for a lengthy discussion as it was reported that patient required SNF placement- however- PT did not recommend SNF.  Patient states that he has become increasingly dependent on his family to help him to to severe and chronic pain issues.  He was diagnosed with MS when he was in his early 23's and has been dealing with neuropathic pain ever since.  He states that he was trying to cope at home and has current DME to support home care. As we continued to talk- patient related that both of his parents have recently passed away (witin a month of one another) and he was unable to attend the funeral services due to his inability to get around.  Patient was noted to be extremely emotional and is still in early grief stages. He admits to feeling depressed but denies any thoughts of SI or HI.  CSW discussed referral for SNF placement- patient stated that he has been placed in SNF's in the past but this did not help him. He stated "they wanted me to do therapy to get stronger but I'm already strong- that's not the issue. The issue is  that I cannot manage with the pain levels that I am in."  Patient tearfully related that he feels he used more pain medication recently as he was also trying to cope both physical pain and the emotional pain of loosing his parents.He feels that his wife and family are supportive but cannot really understand what is 'goes through" each day with pain.  CSW offered to provide patient with information re: grief support and counseling but he denied as he states he cannot get to the meetings. He does feel that he has good support through his Doristine Bosworth and that this has helped  Patient plans to return home with his wife and continued support of family.   Assessment/plan status:  No Further Intervention Required Other assessment/ plan:   Information/referral to community resources:   Offered grief support and counseling services but patient defers.  Also offered information re: SNF's and ALFs - he also deferred.    [name]/FAMILY'S RESPONSE TO PLAN OF CARE: Patient was noted throughout the interview to be alert, oriented and responsive in interacton with CSW.  He was noted to be quite tearful as the interview progressed as he spoke of his grief issues of loosing both parents. Patient was unable to attend their funerals and does not appear to have closure. He stated that his mother had Alzheimers and was in a facility but  his father was very healthy, active and fully of sound mind.  His death as so unexpected and patient relates that he is having a difficult time accepting their deaths.  Patient is aware of Grief support services such as Hospice and stated that he knows how to access services if needed.  He plans to return home with family and denies any further needs.  CSW signing off. Lorie Phenix. [place], [place]

## 2013-05-17 ENCOUNTER — Telehealth: Payer: Self-pay | Admitting: Neurology

## 2013-05-17 NOTE — Telephone Encounter (Signed)
Pt called and stated that he needs refills on HYDROcodone-acetaminophen (NORCO) 10-325 MG per tablet and OxyCODONE (OXYCONTIN) 80 mg T12A 12 hr tablet. Please call patient when they are ready for pick up.  Thank you

## 2013-05-17 NOTE — Telephone Encounter (Signed)
Rx's were last written on 03/16.  It is too soon to refill these meds.  I called the patient back.  He is aware.

## 2013-05-20 ENCOUNTER — Other Ambulatory Visit: Payer: Self-pay

## 2013-05-20 ENCOUNTER — Telehealth: Payer: Self-pay | Admitting: *Deleted

## 2013-05-20 MED ORDER — OXYCODONE HCL ER 80 MG PO T12A: 80.0000 mg | EXTENDED_RELEASE_TABLET | Freq: Four times a day (QID) | ORAL | Status: DC

## 2013-05-20 MED ORDER — HYDROCODONE-ACETAMINOPHEN 10-325 MG PO TABS: 1.0000 | ORAL_TABLET | Freq: Every day | ORAL | Status: DC

## 2013-05-20 NOTE — Telephone Encounter (Signed)
Patient called and requested pick up of RX for  HYDROcodone-acetaminophen (NORCO) 10-325 MG per tablet and OxyCODONE (OXYCONTIN) 80 mg T12A 12 hr tablet due to need to arrange his transportation for picking up prescription.  He's aware he can't refill until 05/23/13.  Could you please call patient when prescription in ready.  Thanks

## 2013-05-21 NOTE — Telephone Encounter (Signed)
[name] wife was notified that the prescriptions are ready for pickup at the front desk.

## 2013-06-06 ENCOUNTER — Ambulatory Visit: Payer: Medicare Other | Admitting: Neurology

## 2013-12-25 ENCOUNTER — Encounter: Payer: Self-pay | Admitting: Neurology

## 2013-12-31 ENCOUNTER — Encounter: Payer: Self-pay | Admitting: Neurology

## 2014-01-02 DIAGNOSIS — R7309 Other abnormal glucose: Secondary | ICD-10-CM | POA: Diagnosis not present

## 2014-01-02 DIAGNOSIS — E161 Other hypoglycemia: Secondary | ICD-10-CM | POA: Diagnosis not present

## 2014-02-19 DIAGNOSIS — E1059 Type 1 diabetes mellitus with other circulatory complications: Secondary | ICD-10-CM | POA: Diagnosis not present

## 2014-02-19 DIAGNOSIS — I2510 Atherosclerotic heart disease of native coronary artery without angina pectoris: Secondary | ICD-10-CM | POA: Diagnosis not present

## 2014-02-19 DIAGNOSIS — G894 Chronic pain syndrome: Secondary | ICD-10-CM | POA: Diagnosis not present

## 2014-02-19 DIAGNOSIS — Z23 Encounter for immunization: Secondary | ICD-10-CM | POA: Diagnosis not present

## 2014-02-19 DIAGNOSIS — I10 Essential (primary) hypertension: Secondary | ICD-10-CM | POA: Diagnosis not present

## 2014-02-19 DIAGNOSIS — N182 Chronic kidney disease, stage 2 (mild): Secondary | ICD-10-CM | POA: Diagnosis not present

## 2014-02-19 DIAGNOSIS — E1029 Type 1 diabetes mellitus with other diabetic kidney complication: Secondary | ICD-10-CM | POA: Diagnosis not present

## 2014-02-19 DIAGNOSIS — Z1389 Encounter for screening for other disorder: Secondary | ICD-10-CM | POA: Diagnosis not present

## 2014-02-25 DIAGNOSIS — Z79891 Long term (current) use of opiate analgesic: Secondary | ICD-10-CM | POA: Diagnosis not present

## 2014-02-25 DIAGNOSIS — E1042 Type 1 diabetes mellitus with diabetic polyneuropathy: Secondary | ICD-10-CM | POA: Diagnosis not present

## 2014-02-25 DIAGNOSIS — G894 Chronic pain syndrome: Secondary | ICD-10-CM | POA: Diagnosis not present

## 2014-03-27 DIAGNOSIS — Z79891 Long term (current) use of opiate analgesic: Secondary | ICD-10-CM | POA: Diagnosis not present

## 2014-03-27 DIAGNOSIS — G894 Chronic pain syndrome: Secondary | ICD-10-CM | POA: Diagnosis not present

## 2014-03-27 DIAGNOSIS — E1042 Type 1 diabetes mellitus with diabetic polyneuropathy: Secondary | ICD-10-CM | POA: Diagnosis not present

## 2014-04-23 DIAGNOSIS — Z79891 Long term (current) use of opiate analgesic: Secondary | ICD-10-CM | POA: Diagnosis not present

## 2014-04-23 DIAGNOSIS — G894 Chronic pain syndrome: Secondary | ICD-10-CM | POA: Diagnosis not present

## 2014-04-23 DIAGNOSIS — E1042 Type 1 diabetes mellitus with diabetic polyneuropathy: Secondary | ICD-10-CM | POA: Diagnosis not present

## 2014-06-24 DIAGNOSIS — G894 Chronic pain syndrome: Secondary | ICD-10-CM | POA: Diagnosis not present

## 2014-06-24 DIAGNOSIS — E1042 Type 1 diabetes mellitus with diabetic polyneuropathy: Secondary | ICD-10-CM | POA: Diagnosis not present

## 2014-06-24 DIAGNOSIS — Z79891 Long term (current) use of opiate analgesic: Secondary | ICD-10-CM | POA: Diagnosis not present

## 2014-07-03 DIAGNOSIS — E1029 Type 1 diabetes mellitus with other diabetic kidney complication: Secondary | ICD-10-CM | POA: Diagnosis not present

## 2014-07-03 DIAGNOSIS — N182 Chronic kidney disease, stage 2 (mild): Secondary | ICD-10-CM | POA: Diagnosis not present

## 2014-07-03 DIAGNOSIS — I2510 Atherosclerotic heart disease of native coronary artery without angina pectoris: Secondary | ICD-10-CM | POA: Diagnosis not present

## 2014-07-03 DIAGNOSIS — I69959 Hemiplegia and hemiparesis following unspecified cerebrovascular disease affecting unspecified side: Secondary | ICD-10-CM | POA: Diagnosis not present

## 2014-07-03 DIAGNOSIS — G894 Chronic pain syndrome: Secondary | ICD-10-CM | POA: Diagnosis not present

## 2014-07-03 DIAGNOSIS — N401 Enlarged prostate with lower urinary tract symptoms: Secondary | ICD-10-CM | POA: Diagnosis not present

## 2014-07-03 DIAGNOSIS — I10 Essential (primary) hypertension: Secondary | ICD-10-CM | POA: Diagnosis not present

## 2014-07-03 DIAGNOSIS — E785 Hyperlipidemia, unspecified: Secondary | ICD-10-CM | POA: Diagnosis not present

## 2014-07-03 DIAGNOSIS — E1059 Type 1 diabetes mellitus with other circulatory complications: Secondary | ICD-10-CM | POA: Diagnosis not present

## 2014-07-03 DIAGNOSIS — Z6826 Body mass index (BMI) 26.0-26.9, adult: Secondary | ICD-10-CM | POA: Diagnosis not present

## 2014-07-03 DIAGNOSIS — E1140 Type 2 diabetes mellitus with diabetic neuropathy, unspecified: Secondary | ICD-10-CM | POA: Diagnosis not present

## 2014-07-03 DIAGNOSIS — Z1389 Encounter for screening for other disorder: Secondary | ICD-10-CM | POA: Diagnosis not present

## 2014-08-19 DIAGNOSIS — G894 Chronic pain syndrome: Secondary | ICD-10-CM | POA: Diagnosis not present

## 2014-08-19 DIAGNOSIS — E1042 Type 1 diabetes mellitus with diabetic polyneuropathy: Secondary | ICD-10-CM | POA: Diagnosis not present

## 2014-08-19 DIAGNOSIS — Z79891 Long term (current) use of opiate analgesic: Secondary | ICD-10-CM | POA: Diagnosis not present

## 2014-09-05 ENCOUNTER — Ambulatory Visit: Payer: Self-pay | Admitting: Podiatry

## 2014-09-11 ENCOUNTER — Ambulatory Visit (INDEPENDENT_AMBULATORY_CARE_PROVIDER_SITE_OTHER): Payer: Medicare Other | Admitting: Podiatry

## 2014-09-11 ENCOUNTER — Encounter: Payer: Self-pay | Admitting: Podiatry

## 2014-09-11 VITALS — BP 160/80 | HR 65 | Resp 14

## 2014-09-11 DIAGNOSIS — M79676 Pain in unspecified toe(s): Secondary | ICD-10-CM | POA: Diagnosis not present

## 2014-09-11 DIAGNOSIS — E1151 Type 2 diabetes mellitus with diabetic peripheral angiopathy without gangrene: Secondary | ICD-10-CM

## 2014-09-11 DIAGNOSIS — E1040 Type 1 diabetes mellitus with diabetic neuropathy, unspecified: Secondary | ICD-10-CM

## 2014-09-11 DIAGNOSIS — B351 Tinea unguium: Secondary | ICD-10-CM | POA: Diagnosis not present

## 2014-09-11 NOTE — Progress Notes (Signed)
   Subjective:    Patient ID: [name], male    DOB: 1945/04/03, 69 y.o.   MRN: [id number]  HPI Patient presents here today for a B/L toenail trim, previously seen 5 years ago. This patient presents to the office with long thick nails.  He says the nails have grown long and thick since his last visit to the podiatrist.  Patient is type 1 diabetic with no feeling below his knees.  No redness or ulcers  Or infections noted both feet.  Review of Systems  All other systems reviewed and are negative.      Objective:   Physical Exam GENERAL APPEARANCE: Alert, conversant. Appropriately groomed. No acute distress.  VASCULAR: Pedal pulses absent bilateral.  Capillary refill time is slowed to his digits.,  Cold feet noted.  NEUROLOGIC: sensation is absent epicritically and protectively to 5.07 monofilament at 5/5 sites bilateral.    MUSCULOSKELETAL: acceptable muscle strength, tone and stability bilateral.  Intrinsic muscluature intact bilateral.  Rectus appearance of foot and digits noted bilateral. Severe pes planus B/L with surgery having been performed on his left foot by Dr. Sharol Given.  DERMATOLOGIC: skin color, texture, and turgor are within normal limits.  No preulcerative lesions or ulcers  are seen, no interdigital maceration noted.  No open lesions present.   No drainage noted.  NAILS  Thick disfigured discolored nails all ten digits both feet in the absence of infection.       Assessment & Plan:  Onychomycosis  B/L  IE  Debridement and grinding of nails both feet.

## 2014-09-18 DIAGNOSIS — H2513 Age-related nuclear cataract, bilateral: Secondary | ICD-10-CM | POA: Diagnosis not present

## 2014-09-18 DIAGNOSIS — H524 Presbyopia: Secondary | ICD-10-CM | POA: Diagnosis not present

## 2014-09-18 DIAGNOSIS — H25043 Posterior subcapsular polar age-related cataract, bilateral: Secondary | ICD-10-CM | POA: Diagnosis not present

## 2014-09-18 DIAGNOSIS — E119 Type 2 diabetes mellitus without complications: Secondary | ICD-10-CM | POA: Diagnosis not present

## 2014-10-14 DIAGNOSIS — E1042 Type 1 diabetes mellitus with diabetic polyneuropathy: Secondary | ICD-10-CM | POA: Diagnosis not present

## 2014-10-14 DIAGNOSIS — Z79891 Long term (current) use of opiate analgesic: Secondary | ICD-10-CM | POA: Diagnosis not present

## 2014-10-14 DIAGNOSIS — G894 Chronic pain syndrome: Secondary | ICD-10-CM | POA: Diagnosis not present

## 2014-10-20 DIAGNOSIS — N401 Enlarged prostate with lower urinary tract symptoms: Secondary | ICD-10-CM | POA: Diagnosis not present

## 2014-10-20 DIAGNOSIS — I2510 Atherosclerotic heart disease of native coronary artery without angina pectoris: Secondary | ICD-10-CM | POA: Diagnosis not present

## 2014-10-20 DIAGNOSIS — E1029 Type 1 diabetes mellitus with other diabetic kidney complication: Secondary | ICD-10-CM | POA: Diagnosis not present

## 2014-10-20 DIAGNOSIS — N182 Chronic kidney disease, stage 2 (mild): Secondary | ICD-10-CM | POA: Diagnosis not present

## 2014-10-20 DIAGNOSIS — E785 Hyperlipidemia, unspecified: Secondary | ICD-10-CM | POA: Diagnosis not present

## 2014-10-20 DIAGNOSIS — I69959 Hemiplegia and hemiparesis following unspecified cerebrovascular disease affecting unspecified side: Secondary | ICD-10-CM | POA: Diagnosis not present

## 2014-10-20 DIAGNOSIS — Z23 Encounter for immunization: Secondary | ICD-10-CM | POA: Diagnosis not present

## 2014-10-20 DIAGNOSIS — I10 Essential (primary) hypertension: Secondary | ICD-10-CM | POA: Diagnosis not present

## 2014-10-20 DIAGNOSIS — G894 Chronic pain syndrome: Secondary | ICD-10-CM | POA: Diagnosis not present

## 2014-10-20 DIAGNOSIS — I129 Hypertensive chronic kidney disease with stage 1 through stage 4 chronic kidney disease, or unspecified chronic kidney disease: Secondary | ICD-10-CM | POA: Diagnosis not present

## 2014-10-20 DIAGNOSIS — E1059 Type 1 diabetes mellitus with other circulatory complications: Secondary | ICD-10-CM | POA: Diagnosis not present

## 2014-10-21 DIAGNOSIS — H25811 Combined forms of age-related cataract, right eye: Secondary | ICD-10-CM | POA: Diagnosis not present

## 2014-10-21 DIAGNOSIS — H25041 Posterior subcapsular polar age-related cataract, right eye: Secondary | ICD-10-CM | POA: Diagnosis not present

## 2014-10-21 DIAGNOSIS — H2511 Age-related nuclear cataract, right eye: Secondary | ICD-10-CM | POA: Diagnosis not present

## 2014-11-12 DIAGNOSIS — K5909 Other constipation: Secondary | ICD-10-CM | POA: Diagnosis not present

## 2014-11-12 DIAGNOSIS — K5903 Drug induced constipation: Secondary | ICD-10-CM | POA: Diagnosis not present

## 2014-11-12 DIAGNOSIS — E1065 Type 1 diabetes mellitus with hyperglycemia: Secondary | ICD-10-CM | POA: Diagnosis not present

## 2014-11-12 DIAGNOSIS — Z87891 Personal history of nicotine dependence: Secondary | ICD-10-CM | POA: Diagnosis not present

## 2014-11-12 DIAGNOSIS — Z8673 Personal history of transient ischemic attack (TIA), and cerebral infarction without residual deficits: Secondary | ICD-10-CM | POA: Diagnosis not present

## 2014-11-12 DIAGNOSIS — K5641 Fecal impaction: Secondary | ICD-10-CM | POA: Diagnosis not present

## 2014-11-12 DIAGNOSIS — I10 Essential (primary) hypertension: Secondary | ICD-10-CM | POA: Diagnosis not present

## 2014-11-12 DIAGNOSIS — Z79899 Other long term (current) drug therapy: Secondary | ICD-10-CM | POA: Diagnosis not present

## 2014-11-12 DIAGNOSIS — K828 Other specified diseases of gallbladder: Secondary | ICD-10-CM | POA: Diagnosis not present

## 2014-11-12 DIAGNOSIS — K8020 Calculus of gallbladder without cholecystitis without obstruction: Secondary | ICD-10-CM | POA: Diagnosis not present

## 2014-11-12 DIAGNOSIS — G35 Multiple sclerosis: Secondary | ICD-10-CM | POA: Diagnosis not present

## 2014-11-12 DIAGNOSIS — K5900 Constipation, unspecified: Secondary | ICD-10-CM | POA: Diagnosis not present

## 2014-11-13 DIAGNOSIS — K828 Other specified diseases of gallbladder: Secondary | ICD-10-CM | POA: Diagnosis not present

## 2014-11-13 DIAGNOSIS — K8020 Calculus of gallbladder without cholecystitis without obstruction: Secondary | ICD-10-CM | POA: Diagnosis not present

## 2014-12-09 DIAGNOSIS — G894 Chronic pain syndrome: Secondary | ICD-10-CM | POA: Diagnosis not present

## 2014-12-09 DIAGNOSIS — E1042 Type 1 diabetes mellitus with diabetic polyneuropathy: Secondary | ICD-10-CM | POA: Diagnosis not present

## 2014-12-09 DIAGNOSIS — Z79891 Long term (current) use of opiate analgesic: Secondary | ICD-10-CM | POA: Diagnosis not present

## 2014-12-09 HISTORY — PX: CATARACT EXTRACTION W/ INTRAOCULAR LENS  IMPLANT, BILATERAL: SHX1307

## 2014-12-16 DIAGNOSIS — H2512 Age-related nuclear cataract, left eye: Secondary | ICD-10-CM | POA: Diagnosis not present

## 2014-12-16 DIAGNOSIS — H25042 Posterior subcapsular polar age-related cataract, left eye: Secondary | ICD-10-CM | POA: Diagnosis not present

## 2014-12-16 DIAGNOSIS — H25812 Combined forms of age-related cataract, left eye: Secondary | ICD-10-CM | POA: Diagnosis not present

## 2014-12-18 ENCOUNTER — Ambulatory Visit: Payer: Medicare Other | Admitting: Podiatry

## 2015-01-20 DIAGNOSIS — G894 Chronic pain syndrome: Secondary | ICD-10-CM | POA: Diagnosis not present

## 2015-01-20 DIAGNOSIS — Z79891 Long term (current) use of opiate analgesic: Secondary | ICD-10-CM | POA: Diagnosis not present

## 2015-01-20 DIAGNOSIS — E1042 Type 1 diabetes mellitus with diabetic polyneuropathy: Secondary | ICD-10-CM | POA: Diagnosis not present

## 2015-01-22 ENCOUNTER — Ambulatory Visit: Payer: Medicare Other | Admitting: Podiatry

## 2015-02-16 ENCOUNTER — Encounter (HOSPITAL_BASED_OUTPATIENT_CLINIC_OR_DEPARTMENT_OTHER): Payer: Self-pay | Admitting: *Deleted

## 2015-02-16 ENCOUNTER — Encounter (HOSPITAL_BASED_OUTPATIENT_CLINIC_OR_DEPARTMENT_OTHER): Payer: Self-pay | Admitting: Anesthesiology

## 2015-02-17 ENCOUNTER — Encounter (HOSPITAL_BASED_OUTPATIENT_CLINIC_OR_DEPARTMENT_OTHER): Payer: Self-pay | Admitting: *Deleted

## 2015-02-17 DIAGNOSIS — Z79891 Long term (current) use of opiate analgesic: Secondary | ICD-10-CM | POA: Diagnosis not present

## 2015-02-17 DIAGNOSIS — G894 Chronic pain syndrome: Secondary | ICD-10-CM | POA: Diagnosis not present

## 2015-02-17 DIAGNOSIS — E1042 Type 1 diabetes mellitus with diabetic polyneuropathy: Secondary | ICD-10-CM | POA: Diagnosis not present

## 2015-02-17 NOTE — Progress Notes (Addendum)
NPO AFTER MN.  ARRIVE AT 0600.  NEEDS ISTAT 8 AND EKG.  WILL TAKE AM MEDS DOS W/ SIPS OF WATER , WITH  EXCEPTION NO INSULIN.  PT WALKS W/ WALKER.   REVIEWED CHART W/ DR Gifford Shave MDA,  STATES WOULD NEED CARDIAC CLEARANCE BUT SINCE NO CARDIOLOGIST AND SEES HIS PCP DR ARONSON, PT NEEDS DR ARONSON TO TAKE RESPONSIBILITY THAT PT CLEARED FROM CARDIAC STANDPOINT.  CALLED AND SPOKE W/  MELISSA AT DR Reynaldo Minium OFFICE AND STATED SINCE DR ARONSON NOT IN OFFICE WILL LEAVE MESSAGE FOR HIM.  RECEIVED CALL BACK FROM DR Reynaldo Minium OFFICE, ERICA HIS MEDICAL ASSISTANT.  ERICA STATED THAT DR ARONSON NEEDED TO KNOW HOW EXTENSIVE THE PROCEDURE WOULD BE , PT MAY NEED MYOVIEW.   TOLD ERICA THAT I WOULD HAVE DR [place] CALL DR ARONSON.  SPOKE W/ RENEE ,  DR KNOX'S ASSISTANT,  SHE WILL LET DR KNOX KNOW.

## 2015-02-18 ENCOUNTER — Other Ambulatory Visit (HOSPITAL_COMMUNITY): Payer: Self-pay | Admitting: Internal Medicine

## 2015-02-18 DIAGNOSIS — Z01810 Encounter for preprocedural cardiovascular examination: Secondary | ICD-10-CM

## 2015-02-19 ENCOUNTER — Inpatient Hospital Stay (HOSPITAL_COMMUNITY): Admission: RE | Admit: 2015-02-19 | Payer: Medicare Other | Source: Ambulatory Visit

## 2015-02-20 ENCOUNTER — Ambulatory Visit (HOSPITAL_BASED_OUTPATIENT_CLINIC_OR_DEPARTMENT_OTHER): Admission: RE | Admit: 2015-02-20 | Payer: Medicare Other | Source: Ambulatory Visit | Admitting: Dentistry

## 2015-02-20 HISTORY — DX: Type 2 diabetes mellitus with diabetic neuropathy, unspecified: E11.40

## 2015-02-20 HISTORY — DX: Essential tremor: G25.0

## 2015-02-20 HISTORY — DX: Chronic kidney disease, stage 2 (mild): N18.2

## 2015-02-20 HISTORY — DX: Personal history of diabetic foot ulcer: Z86.31

## 2015-02-20 HISTORY — DX: Unspecified sequelae of cerebral infarction: I69.30

## 2015-02-20 HISTORY — DX: Hesitancy of micturition: R39.11

## 2015-02-20 HISTORY — DX: Presence of coronary angioplasty implant and graft: Z95.5

## 2015-02-20 HISTORY — DX: Type 1 diabetes mellitus without complications: E10.9

## 2015-02-20 HISTORY — DX: Chronic pain syndrome: G89.4

## 2015-02-20 HISTORY — DX: Benign prostatic hyperplasia with lower urinary tract symptoms: N40.1

## 2015-02-20 HISTORY — DX: Old myocardial infarction: I25.2

## 2015-02-20 HISTORY — DX: Personal history of transient ischemic attack (TIA), and cerebral infarction without residual deficits: Z86.73

## 2015-02-20 HISTORY — DX: Type 2 diabetes mellitus with diabetic neuropathic arthropathy: E11.610

## 2015-02-20 SURGERY — DENTAL RESTORATION/EXTRACTIONS
Anesthesia: General

## 2015-03-17 DIAGNOSIS — G894 Chronic pain syndrome: Secondary | ICD-10-CM | POA: Diagnosis not present

## 2015-03-17 DIAGNOSIS — E1042 Type 1 diabetes mellitus with diabetic polyneuropathy: Secondary | ICD-10-CM | POA: Diagnosis not present

## 2015-03-17 DIAGNOSIS — Z79891 Long term (current) use of opiate analgesic: Secondary | ICD-10-CM | POA: Diagnosis not present

## 2015-04-14 DIAGNOSIS — E1042 Type 1 diabetes mellitus with diabetic polyneuropathy: Secondary | ICD-10-CM | POA: Diagnosis not present

## 2015-04-14 DIAGNOSIS — Z79891 Long term (current) use of opiate analgesic: Secondary | ICD-10-CM | POA: Diagnosis not present

## 2015-04-14 DIAGNOSIS — G894 Chronic pain syndrome: Secondary | ICD-10-CM | POA: Diagnosis not present

## 2015-04-17 DIAGNOSIS — E538 Deficiency of other specified B group vitamins: Secondary | ICD-10-CM | POA: Diagnosis not present

## 2015-04-17 DIAGNOSIS — E1029 Type 1 diabetes mellitus with other diabetic kidney complication: Secondary | ICD-10-CM | POA: Diagnosis not present

## 2015-04-17 DIAGNOSIS — Z0000 Encounter for general adult medical examination without abnormal findings: Secondary | ICD-10-CM | POA: Diagnosis not present

## 2015-04-17 DIAGNOSIS — R945 Abnormal results of liver function studies: Secondary | ICD-10-CM | POA: Diagnosis not present

## 2015-04-17 DIAGNOSIS — I129 Hypertensive chronic kidney disease with stage 1 through stage 4 chronic kidney disease, or unspecified chronic kidney disease: Secondary | ICD-10-CM | POA: Diagnosis not present

## 2015-04-17 DIAGNOSIS — N182 Chronic kidney disease, stage 2 (mild): Secondary | ICD-10-CM | POA: Diagnosis not present

## 2015-04-17 DIAGNOSIS — E784 Other hyperlipidemia: Secondary | ICD-10-CM | POA: Diagnosis not present

## 2015-04-17 DIAGNOSIS — N401 Enlarged prostate with lower urinary tract symptoms: Secondary | ICD-10-CM | POA: Diagnosis not present

## 2015-04-17 DIAGNOSIS — H6121 Impacted cerumen, right ear: Secondary | ICD-10-CM | POA: Diagnosis not present

## 2015-04-17 DIAGNOSIS — I10 Essential (primary) hypertension: Secondary | ICD-10-CM | POA: Diagnosis not present

## 2015-04-17 DIAGNOSIS — Z6828 Body mass index (BMI) 28.0-28.9, adult: Secondary | ICD-10-CM | POA: Diagnosis not present

## 2015-04-17 DIAGNOSIS — I252 Old myocardial infarction: Secondary | ICD-10-CM | POA: Diagnosis not present

## 2015-04-17 DIAGNOSIS — Z1389 Encounter for screening for other disorder: Secondary | ICD-10-CM | POA: Diagnosis not present

## 2015-04-17 DIAGNOSIS — I69959 Hemiplegia and hemiparesis following unspecified cerebrovascular disease affecting unspecified side: Secondary | ICD-10-CM | POA: Diagnosis not present

## 2015-04-17 DIAGNOSIS — G35 Multiple sclerosis: Secondary | ICD-10-CM | POA: Diagnosis not present

## 2015-04-20 ENCOUNTER — Other Ambulatory Visit: Payer: Self-pay | Admitting: Internal Medicine

## 2015-04-20 DIAGNOSIS — R7989 Other specified abnormal findings of blood chemistry: Secondary | ICD-10-CM

## 2015-04-20 DIAGNOSIS — R945 Abnormal results of liver function studies: Principal | ICD-10-CM

## 2015-04-21 ENCOUNTER — Ambulatory Visit: Payer: Medicare Other | Admitting: Physician Assistant

## 2015-04-22 NOTE — Progress Notes (Signed)
Cardiology Office Note:    Date:  04/23/2015   ID:  [name] Mc., DOB 11/13/1945, MRN [id number]  PCP:  Geoffery Lyons, MD  Cardiologist:  Dr.  Bing Quarry >> Dr. Dorris Carnes (seen in [facility] 3/15)  Electrophysiologist:  n/a  Chief Complaint  Patient presents with  . Surgical Clearance    needs ECG    History of Present Illness:     [name]. is a 70 y.o. male with a hx of CAD s/p DES to RCA in 2005, DM, HTN, HL, CKD, Multiple Sclerosis, chronic pain, prior CVA.  prior patient of Dr.  Bing Quarry.  Last seen by our group in 3/15 by Dr. Dorris Carnes when he was admitted with chest pain and opiate withdrawal.  CEs were neg. No further testing was recommended at that time.     He apparently needs oral surgery (dental extraction) with Dr. [name] I do not have any records from Dr. Jackson Latino office.  The patient's wife works with Dr. Geralynn Ochs and his son called her while in the office.  Dr. Jackson Latino office is closed today. The plan, according to the wife, is to have his extraction done at [facility] surgical center. I assume he needs general anesthesia.  The patient is debilitated from severe neuropathy.  He walks with a walker.  He can only walk a few feet before he stops.  He is fairly sedentary at home according to his son.  The patient denies any chest pain, syncope, significant dyspnea. Denies orthopnea, PND, edema.  The patient tells me he had extensive surgery on his foot not long ago without needing a stress test.  I reviewed his chart.  This was in 2013.     Past Medical History  Diagnosis Date  . PONV (postoperative nausea and vomiting)   . Depression   . Hypertension     dr Reynaldo Minium  . MS (multiple sclerosis) ([place])     dx 1985  . Arthritis   . Gait disorder     uses walker  . History of MI (myocardial infarction)     2005  . History of CVA (cerebrovascular accident)     2011-  right subcortical cva--  residual left mininmal side weakness and no use of left head  . S/P  drug eluting coronary stent placement     2005  to [place]  . Type 1 diabetes mellitus on insulin therapy ([place])   . Diabetic neuropathy ([place])   . Chronic pain syndrome followed by dr mark phillps at pain clinic    secondary to [place] and diabetic neuropathy  . Charcot's arthropathy, diabetic ([place])     left foot  . History of diabetic ulcer of foot     2010  left foot  . CKD (chronic kidney disease), stage II     hypertensive per PCP note  . Essential tremor   . History of CVA with residual deficit     2011  --  right subcortical cva w/ mininmal left hemiparesis  . Benign prostatic hypertrophy with hesitancy   . Coronary artery disease hx MI  s/p  DES to mRCA 2005    hx cardiologist-- dr Lia Foyer until he retired,  now followed by pcp , dr Reynaldo Minium    Past Surgical History  Procedure Laterality Date  . Orif ankle fracture  12/30/2011    Procedure: OPEN REDUCTION INTERNAL FIXATION (ORIF) ANKLE FRACTURE;  Surgeon: Newt Minion, MD;  Location: [place];  Service: Orthopedics;  Laterality: Left;  Excision Bone, Abscess Left Charcot Foot, Place Antibiotic Beads, Fixation Charcot  . Hardware removal Left 04/20/2012    Procedure: Left foot removal deep retained screw;  Surgeon: Newt Minion, MD;  Location: [place];  Service: Orthopedics;  Laterality: Left;  . Coronary angioplasty with stent placement  10-18-2003  dr Lia Foyer    DES x1  to  mRCA (high-grade lesion), diffuse diabetic abnormalities throughout the entire coronary tree w/ diffuse calcification and segmental plaquing throughout but noncritical disease  . Removal submandidular gland, bilateral  1970's    benign  . Cataract extraction w/ intraocular lens  implant, bilateral  Nov 2016    Current Medications: Outpatient Prescriptions Prior to Visit  Medication Sig Dispense Refill  . amLODipine (NORVASC) 5 MG tablet Take 5 mg by mouth every morning.     Marland Kitchen aspirin 81 MG tablet Take 81 mg by mouth daily.    Marland Kitchen atorvastatin (LIPITOR) 10 MG tablet  Take 10 mg by mouth every morning.     . clopidogrel (PLAVIX) 75 MG tablet Take 75 mg by mouth daily.    . DULoxetine (CYMBALTA) 60 MG capsule Take 60 mg by mouth 2 (two) times daily.    . finasteride (PROSCAR) 5 MG tablet Take 5 mg by mouth every morning.     . insulin aspart (NOVOLOG FLEXPEN) 100 UNIT/ML FlexPen Inject into the skin 2 (two) times daily. Sliding scale    . Insulin NPH, Human,, Isophane, (HUMULIN N KWIKPEN) 100 UNIT/ML Kiwkpen Inject into the skin 2 (two) times daily. 38-39 units in AM  And 18-19  units in PM    . metoprolol tartrate (LOPRESSOR) 25 MG tablet Take 25 mg by mouth 2 (two) times daily.    . Multiple Vitamin (MULTIVITAMIN WITH MINERALS) TABS tablet Take 1 tablet by mouth daily.    . nitroGLYCERIN (NITROSTAT) 0.4 MG SL tablet Place 0.4 mg under the tongue every 5 (five) minutes as needed for chest pain (x 3 doses). For chest pain    . oxycodone (ROXICODONE) 30 MG immediate release tablet Take 30 mg by mouth 6 (six) times daily.    . polyethylene glycol (MIRALAX / GLYCOLAX) packet Take 17 g by mouth daily.    . Tapentadol HCl (NUCYNTA ER) 250 MG TB12 Take 1 tablet by mouth 2 (two) times daily.    . traZODone (DESYREL) 150 MG tablet Take 150 mg by mouth at bedtime.    . metoCLOPramide (REGLAN) 5 MG tablet Take 5 mg by mouth every 6 (six) hours as needed for nausea.     No facility-administered medications prior to visit.     Allergies:   Review of patient's allergies indicates no known allergies.   Social History   Social History  . Marital Status: Married    Spouse Name: N/A  . Number of Children: 2  . Years of Education: [employer]   Social History Main Topics  . Smoking status: Former Smoker -- 0.50 packs/day for 26 years    Types: Cigars, Cigarettes    Quit date: 02/17/2012  . Smokeless tobacco: Never Used     Comment: stopped cigar's 2014/  cigarette's stopped 1990's  . Alcohol Use: No  . Drug Use: No  . Sexual Activity: Not Asked   Other Topics  Concern  . None   Social History Narrative     Family History:  The patient's family history includes Cancer - Prostate in his paternal grandfather; Celiac disease in his brother;  Heart disease in his father; Tremor in his mother.   ROS:   Please see the history of present illness.    ROS All other systems reviewed and are negative.   Physical Exam:    VS:  BP 134/60 mmHg  Pulse 70  Ht 6\' 1"  (1.854 m)  Wt 207 lb (93.895 kg)  BMI 27.32 kg/m2   GEN: chronically ill appearing male in no acute distress HEENT: normal Neck: no JVD, no masses Cardiac: Normal S1/S2, RRR; no murmurs, rubs, or gallops, no edema;     Respiratory:  clear to auscultation bilaterally; no wheezing, rhonchi or rales GI: soft, nontender, nondistended MS: no deformity or atrophy Skin: warm and dry Neuro: No focal deficits  Psych: Alert and oriented x 3, normal affect  Wt Readings from Last 3 Encounters:  04/23/15 207 lb (93.895 kg)  02/17/15 170 lb (77.111 kg)  04/22/13 175 lb 12.8 oz (79.742 kg)      Studies/Labs Reviewed:     EKG:  EKG is  ordered today.  The ekg ordered today demonstrates NSR, HR 69, NSSTTW changes, QTc 424 ms, no sig changes.   Recent Labs: No results found for requested labs within last 365 days.  04/17/15: BUN 13, Cr 1.2, K 5.1, ALT 19, Hgb 14, LDL 64, HDL 42, TC 128, TG 112, TSH 1.99  Recent Lipid Panel No results found for: CHOL, TRIG, HDL, CHOLHDL, VLDL, LDLCALC, LDLDIRECT  Additional studies/ records that were reviewed today include:   LHC 9/05 EF 60% LM ok LAD diffuse abnormalities with typical diabetic appearance, ostial 40%, mid 50%, 30-40% after Dx, Dx ostial 40% LCx diff irregs RCA mid 80%, AM ostial 70%, PDA prox to mid 50%, PLB prox 70% PCI:  2.5 x 18 mm Cypher DES to mid RCA   ASSESSMENT:     1. Pre-operative cardiovascular examination   2. Coronary artery disease involving native coronary artery of native heart without angina pectoris   3. Essential  hypertension   4. Hyperlipidemia     PLAN:     In order of problems listed above:  1. Surgical clearance - The patient does not have any unstable cardiac conditions.  He is fairly sedentary.  He has no chest pain.  ECG is unchanged.  His surgical procedure is fairly low risk.  I reviewed his case with Dr. Daneen Schick (DOD).  Overall, we feel he requires no further cardiac workup prior to his noncardiac surgery and should be at acceptable risk.  Our service is available as necessary in the perioperative period.   2. CAD - Prior hx of Cypher DES to RCA in 2005. No ischemic testing since. He denies chest pain.   Continue ASA, Plavix, beta-blocker, statin.  It has been 12 years since his PCI.  So, it should be ok to hold Plavix. He should remain on ASA without interruption.  Continue beta-blocker, statin.   3. HTN - Controlled.  4. HL - Continue statin.  Managed by PCP.    Medication Adjustments/Labs and Tests Ordered: Current medicines are reviewed at length with the patient today.  Concerns regarding medicines are outlined above.  Medication changes, Labs and Tests ordered today are outlined in the Patient Instructions noted below. Patient Instructions  Medication Instructions:  Your physician recommends that you continue on your current medications as directed. Please refer to the Current Medication list given to you today.  Labwork: NONE  Testing/Procedures: NONE  Follow-Up: Your physician wants you to follow-up in: 6 MONTHS  WITH DR. Harrington Challenger. You will receive a reminder letter in the mail two months in advance. If you don't receive a letter, please call our office to schedule the follow-up appointment.  Any Other Special Instructions Will Be Listed Below (If Applicable).  If you need a refill on your cardiac medications before your next appointment, please call your pharmacy.   Signed, Richardson Dopp, PA-C  04/23/2015 4:30 PM    [facility] [address], [place], [place]  [address] Phone: [phone]; Fax: [phone]

## 2015-04-23 ENCOUNTER — Ambulatory Visit (INDEPENDENT_AMBULATORY_CARE_PROVIDER_SITE_OTHER): Payer: Medicare Other | Admitting: Physician Assistant

## 2015-04-23 ENCOUNTER — Encounter: Payer: Self-pay | Admitting: Physician Assistant

## 2015-04-23 VITALS — BP 134/60 | HR 70 | Ht 73.0 in | Wt 207.0 lb

## 2015-04-23 DIAGNOSIS — I2510 Atherosclerotic heart disease of native coronary artery without angina pectoris: Secondary | ICD-10-CM | POA: Diagnosis not present

## 2015-04-23 DIAGNOSIS — I10 Essential (primary) hypertension: Secondary | ICD-10-CM

## 2015-04-23 DIAGNOSIS — E785 Hyperlipidemia, unspecified: Secondary | ICD-10-CM | POA: Diagnosis not present

## 2015-04-23 DIAGNOSIS — Z01810 Encounter for preprocedural cardiovascular examination: Secondary | ICD-10-CM | POA: Diagnosis not present

## 2015-04-23 NOTE — Patient Instructions (Addendum)
Medication Instructions:  Your physician recommends that you continue on your current medications as directed. Please refer to the Current Medication list given to you today.   Labwork: NONE  Testing/Procedures: NONE  Follow-Up: Your physician wants you to follow-up in: 6 MONTHS WITH DR. ROSS... You will receive a reminder letter in the mail two months in advance. If you don't receive a letter, please call our office to schedule the follow-up appointment.   Any Other Special Instructions Will Be Listed Below (If Applicable).     If you need a refill on your cardiac medications before your next appointment, please call your pharmacy.   

## 2015-04-27 ENCOUNTER — Ambulatory Visit
Admission: RE | Admit: 2015-04-27 | Discharge: 2015-04-27 | Disposition: A | Discharge status: Home / Self Care | Payer: Medicare Other | Source: Ambulatory Visit | Attending: Internal Medicine | Admitting: Internal Medicine

## 2015-04-27 DIAGNOSIS — R7989 Other specified abnormal findings of blood chemistry: Secondary | ICD-10-CM

## 2015-04-27 DIAGNOSIS — R945 Abnormal results of liver function studies: Principal | ICD-10-CM

## 2015-04-30 DIAGNOSIS — R945 Abnormal results of liver function studies: Secondary | ICD-10-CM | POA: Diagnosis not present

## 2015-05-12 DIAGNOSIS — G894 Chronic pain syndrome: Secondary | ICD-10-CM | POA: Diagnosis not present

## 2015-05-12 DIAGNOSIS — E1042 Type 1 diabetes mellitus with diabetic polyneuropathy: Secondary | ICD-10-CM | POA: Diagnosis not present

## 2015-05-12 DIAGNOSIS — Z79891 Long term (current) use of opiate analgesic: Secondary | ICD-10-CM | POA: Diagnosis not present

## 2015-05-14 ENCOUNTER — Encounter (HOSPITAL_BASED_OUTPATIENT_CLINIC_OR_DEPARTMENT_OTHER): Payer: Self-pay | Admitting: *Deleted

## 2015-05-14 NOTE — Progress Notes (Signed)
NPO AFTER MN.  ARRIVE AT 0715.  CURRENT LAB RESULTS, EKG, AND H&P IN CHART.  WILL TAKE AM MEDS W/ SIPS OF WATER DOS.

## 2015-05-15 ENCOUNTER — Ambulatory Visit (HOSPITAL_BASED_OUTPATIENT_CLINIC_OR_DEPARTMENT_OTHER): Payer: Medicare Other | Admitting: Anesthesiology

## 2015-05-15 ENCOUNTER — Ambulatory Visit (HOSPITAL_BASED_OUTPATIENT_CLINIC_OR_DEPARTMENT_OTHER)
Admission: RE | Admit: 2015-05-15 | Discharge: 2015-05-15 | Disposition: A | Discharge status: Home / Self Care | Payer: Medicare Other | Source: Ambulatory Visit | Attending: Dentistry | Admitting: Dentistry

## 2015-05-15 ENCOUNTER — Encounter (HOSPITAL_BASED_OUTPATIENT_CLINIC_OR_DEPARTMENT_OTHER): Payer: Self-pay | Admitting: *Deleted

## 2015-05-15 ENCOUNTER — Encounter (HOSPITAL_BASED_OUTPATIENT_CLINIC_OR_DEPARTMENT_OTHER)
Admission: RE | Disposition: A | Discharge status: Home / Self Care | Payer: Self-pay | Source: Ambulatory Visit | Attending: Dentistry

## 2015-05-15 DIAGNOSIS — Z794 Long term (current) use of insulin: Secondary | ICD-10-CM | POA: Insufficient documentation

## 2015-05-15 DIAGNOSIS — I129 Hypertensive chronic kidney disease with stage 1 through stage 4 chronic kidney disease, or unspecified chronic kidney disease: Secondary | ICD-10-CM | POA: Insufficient documentation

## 2015-05-15 DIAGNOSIS — E1022 Type 1 diabetes mellitus with diabetic chronic kidney disease: Secondary | ICD-10-CM | POA: Diagnosis not present

## 2015-05-15 DIAGNOSIS — K056 Periodontal disease, unspecified: Secondary | ICD-10-CM | POA: Diagnosis not present

## 2015-05-15 DIAGNOSIS — Z87891 Personal history of nicotine dependence: Secondary | ICD-10-CM | POA: Insufficient documentation

## 2015-05-15 DIAGNOSIS — Z8673 Personal history of transient ischemic attack (TIA), and cerebral infarction without residual deficits: Secondary | ICD-10-CM | POA: Insufficient documentation

## 2015-05-15 DIAGNOSIS — G35 Multiple sclerosis: Secondary | ICD-10-CM | POA: Insufficient documentation

## 2015-05-15 DIAGNOSIS — Z955 Presence of coronary angioplasty implant and graft: Secondary | ICD-10-CM | POA: Insufficient documentation

## 2015-05-15 DIAGNOSIS — G894 Chronic pain syndrome: Secondary | ICD-10-CM | POA: Diagnosis not present

## 2015-05-15 DIAGNOSIS — I252 Old myocardial infarction: Secondary | ICD-10-CM | POA: Diagnosis not present

## 2015-05-15 DIAGNOSIS — I2510 Atherosclerotic heart disease of native coronary artery without angina pectoris: Secondary | ICD-10-CM | POA: Insufficient documentation

## 2015-05-15 DIAGNOSIS — G629 Polyneuropathy, unspecified: Secondary | ICD-10-CM | POA: Diagnosis not present

## 2015-05-15 DIAGNOSIS — N182 Chronic kidney disease, stage 2 (mild): Secondary | ICD-10-CM | POA: Diagnosis not present

## 2015-05-15 HISTORY — DX: Periodontal disease, unspecified: K05.6

## 2015-05-15 HISTORY — PX: MULTIPLE EXTRACTIONS WITH ALVEOLOPLASTY: SHX5342

## 2015-05-15 LAB — POCT I-STAT 4, (NA,K, GLUC, HGB,HCT)
Glucose, Bld: 91 mg/dL (ref 65–99)
HCT: 44 % (ref 39.0–52.0)
Hemoglobin: 15 g/dL (ref 13.0–17.0)
POTASSIUM: 4.5 mmol/L (ref 3.5–5.1)
SODIUM: 141 mmol/L (ref 135–145)

## 2015-05-15 LAB — GLUCOSE, CAPILLARY
GLUCOSE-CAPILLARY: 86 mg/dL (ref 65–99)
Glucose-Capillary: 207 mg/dL — ABNORMAL HIGH (ref 65–99)

## 2015-05-15 SURGERY — MULTIPLE EXTRACTION WITH ALVEOLOPLASTY
Anesthesia: General | Site: Mouth

## 2015-05-15 MED ORDER — FENTANYL CITRATE (PF) 100 MCG/2ML IJ SOLN
25.0000 ug | INTRAMUSCULAR | Status: DC | PRN
  Administered 2015-05-15 (×4): 25 ug via INTRAVENOUS
  Administered 2015-05-15: 50 ug via INTRAVENOUS
  Filled 2015-05-15: qty 1

## 2015-05-15 MED ORDER — MIDAZOLAM HCL 2 MG/2ML IJ SOLN
INTRAMUSCULAR | Status: AC
  Filled 2015-05-15: qty 2

## 2015-05-15 MED ORDER — ONDANSETRON HCL 4 MG/2ML IJ SOLN
INTRAMUSCULAR | Status: AC
  Filled 2015-05-15: qty 2

## 2015-05-15 MED ORDER — OXYCODONE HCL 5 MG PO TABS
ORAL_TABLET | ORAL | Status: AC
Start: 2015-05-15 — End: 2015-05-15
  Filled 2015-05-15: qty 6

## 2015-05-15 MED ORDER — DEXAMETHASONE SODIUM PHOSPHATE 10 MG/ML IJ SOLN
INTRAMUSCULAR | Status: AC
  Filled 2015-05-15: qty 1

## 2015-05-15 MED ORDER — PROPOFOL 10 MG/ML IV BOLUS
INTRAVENOUS | Status: DC | PRN
  Administered 2015-05-15: 170 mg via INTRAVENOUS

## 2015-05-15 MED ORDER — FENTANYL CITRATE (PF) 100 MCG/2ML IJ SOLN
INTRAMUSCULAR | Status: AC
Start: 2015-05-15 — End: 2015-05-15
  Filled 2015-05-15: qty 2

## 2015-05-15 MED ORDER — LIDOCAINE HCL (CARDIAC) 20 MG/ML IV SOLN: INTRAVENOUS | Status: DC | PRN

## 2015-05-15 MED ORDER — EPHEDRINE SULFATE 50 MG/ML IJ SOLN
INTRAMUSCULAR | Status: DC | PRN
  Administered 2015-05-15 (×2): 10 mg via INTRAVENOUS

## 2015-05-15 MED ORDER — DEXTROSE 5 % IV SOLN
10.0000 mg | INTRAVENOUS | Status: DC | PRN
  Administered 2015-05-15: 20 ug/min via INTRAVENOUS

## 2015-05-15 MED ORDER — LACTATED RINGERS IV SOLN
INTRAVENOUS | Status: DC
  Administered 2015-05-15: 12:00:00 via INTRAVENOUS
  Filled 2015-05-15: qty 1000

## 2015-05-15 MED ORDER — LIDOCAINE HCL (CARDIAC) 20 MG/ML IV SOLN
INTRAVENOUS | Status: AC
  Filled 2015-05-15: qty 5

## 2015-05-15 MED ORDER — FENTANYL CITRATE (PF) 100 MCG/2ML IJ SOLN
INTRAMUSCULAR | Status: DC | PRN
Start: 2015-05-15 — End: 2015-05-15
  Administered 2015-05-15: 50 ug via INTRAVENOUS

## 2015-05-15 MED ORDER — EPHEDRINE SULFATE 50 MG/ML IJ SOLN
INTRAMUSCULAR | Status: AC
  Filled 2015-05-15: qty 1

## 2015-05-15 MED ORDER — SUCCINYLCHOLINE CHLORIDE 20 MG/ML IJ SOLN
INTRAMUSCULAR | Status: DC | PRN
  Administered 2015-05-15: 100 mg via INTRAVENOUS

## 2015-05-15 MED ORDER — PROPOFOL 10 MG/ML IV BOLUS
INTRAVENOUS | Status: AC
  Filled 2015-05-15: qty 20

## 2015-05-15 MED ORDER — LIDOCAINE-EPINEPHRINE 2 %-1:100000 IJ SOLN
INTRAMUSCULAR | Status: DC | PRN
  Administered 2015-05-15: 15.3 mL

## 2015-05-15 MED ORDER — LACTATED RINGERS IV SOLN
INTRAVENOUS | Status: DC
  Administered 2015-05-15: 08:00:00 via INTRAVENOUS
  Filled 2015-05-15: qty 1000

## 2015-05-15 MED ORDER — LIDOCAINE HCL (CARDIAC) 20 MG/ML IV SOLN
INTRAVENOUS | Status: DC | PRN
  Administered 2015-05-15: 60 mg via INTRAVENOUS

## 2015-05-15 MED ORDER — OXYCODONE HCL 5 MG PO TABS
30.0000 mg | ORAL_TABLET | ORAL | Status: DC | PRN
  Administered 2015-05-15: 30 mg via ORAL
  Filled 2015-05-15: qty 6

## 2015-05-15 MED ORDER — FENTANYL CITRATE (PF) 100 MCG/2ML IJ SOLN
INTRAMUSCULAR | Status: AC
  Filled 2015-05-15: qty 2

## 2015-05-15 MED ORDER — MIDAZOLAM HCL 5 MG/5ML IJ SOLN
INTRAMUSCULAR | Status: DC | PRN
Start: 2015-05-15 — End: 2015-05-15
  Administered 2015-05-15 (×2): 1 mg via INTRAVENOUS

## 2015-05-15 MED ORDER — ONDANSETRON HCL 4 MG/2ML IJ SOLN
INTRAMUSCULAR | Status: DC | PRN
  Administered 2015-05-15: 4 mg via INTRAVENOUS

## 2015-05-15 MED ORDER — OXYMETAZOLINE HCL 0.05 % NA SOLN
3.0000 | NASAL | Status: DC | PRN
  Administered 2015-05-15: 3 via NASAL
  Filled 2015-05-15: qty 15

## 2015-05-15 MED ORDER — INSULIN ASPART 100 UNIT/ML ~~LOC~~ SOLN
0.0000 [IU] | SUBCUTANEOUS | Status: DC
  Filled 2015-05-15: qty 0.15

## 2015-05-15 MED ORDER — ARTIFICIAL TEARS OP OINT
TOPICAL_OINTMENT | OPHTHALMIC | Status: AC
  Filled 2015-05-15: qty 3.5

## 2015-05-15 MED ORDER — PHENYLEPHRINE HCL 10 MG/ML IJ SOLN
INTRAMUSCULAR | Status: AC
  Filled 2015-05-15: qty 1

## 2015-05-15 MED ORDER — DEXAMETHASONE SODIUM PHOSPHATE 4 MG/ML IJ SOLN
INTRAMUSCULAR | Status: DC | PRN
  Administered 2015-05-15: 10 mg via INTRAVENOUS

## 2015-05-15 SURGICAL SUPPLY — 19 items
BLADE SURG 15 STRL LF DISP TIS (BLADE) ×2 IMPLANT
BLADE SURG 15 STRL SS (BLADE) ×2
CANISTER SUCT 1200ML W/VALVE (MISCELLANEOUS) ×2 IMPLANT
COVER MAYO STAND STRL (DRAPES) ×2 IMPLANT
COVER SURGICAL LIGHT HANDLE (MISCELLANEOUS) ×2 IMPLANT
GLOVE BIO SURGEON STRL SZ 6.5 (GLOVE) ×2 IMPLANT
GLOVE BIO SURGEON STRL SZ7.5 (GLOVE) ×2 IMPLANT
GOWN STRL REUS W/ TWL LRG LVL3 (GOWN DISPOSABLE) ×1 IMPLANT
GOWN STRL REUS W/TWL LRG LVL3 (GOWN DISPOSABLE) ×1
PACK BASIN DAY SURGERY FS (CUSTOM PROCEDURE TRAY) ×2 IMPLANT
SHEET MEDIUM DRAPE 40X70 STRL (DRAPES) ×2 IMPLANT
SPONGE GAUZE 4X4 12PLY STER LF (GAUZE/BANDAGES/DRESSINGS) ×12 IMPLANT
SUCTION FRAZIER HANDLE 10FR (MISCELLANEOUS) ×1
SUCTION TUBE FRAZIER 10FR DISP (MISCELLANEOUS) ×1 IMPLANT
SUT SILK 4 0 PS 2 (SUTURE) IMPLANT
SYR BULB 3OZ (MISCELLANEOUS) ×2 IMPLANT
TUBE CONNECTING 20X1/4 (TUBING) ×2 IMPLANT
WATER STERILE IRR 1000ML POUR (IV SOLUTION) ×2 IMPLANT
YANKAUER SUCT BULB TIP NO VENT (SUCTIONS) ×2 IMPLANT

## 2015-05-15 NOTE — Anesthesia Procedure Notes (Signed)
Procedure Name: Intubation Date/Time: 05/15/2015 9:19 AM Performed by: Denna Haggard D Pre-anesthesia Checklist: Patient identified, Emergency Drugs available, Suction available and Patient being monitored Patient Re-evaluated:Patient Re-evaluated prior to inductionOxygen Delivery Method: Circle System Utilized Preoxygenation: Pre-oxygenation with 100% oxygen Intubation Type: IV induction Ventilation: Mask ventilation without difficulty Laryngoscope Size: Mac and 3 Grade View: Grade I Nasal Tubes: Nasal prep performed, Nasal Rae and Right Tube size: 7.0 mm Number of attempts: 1 Placement Confirmation: ETT inserted through vocal cords under direct vision,  positive ETCO2 and breath sounds checked- equal and bilateral Tube secured with: Tape Dental Injury: Teeth and Oropharynx as per pre-operative assessment  Comments: Afrin spray x 3 to right nare and red rubber catheter used

## 2015-05-15 NOTE — Anesthesia Postprocedure Evaluation (Signed)
Anesthesia Post Note  Patient: [name] Halfpenny Rennert Jr.  Procedure(s) Performed: Procedure(s) (LRB): MULTIPLE EXTRACTIONS (N/A)  Patient location during evaluation: PACU Anesthesia Type: General Level of consciousness: awake and alert Pain management: pain level controlled Vital Signs Assessment: post-procedure vital signs reviewed and stable Respiratory status: spontaneous breathing, nonlabored ventilation, respiratory function stable and patient connected to nasal cannula oxygen Cardiovascular status: blood pressure returned to baseline and stable Postop Assessment: no signs of nausea or vomiting Anesthetic complications: no    Last Vitals:  Filed Vitals:   05/15/15 1245 05/15/15 1300  BP: 159/78 165/112  Pulse: 69 69  Temp:    Resp: 16 15    Last Pain:  Filed Vitals:   05/15/15 1304  PainSc: 7                  [name]

## 2015-05-15 NOTE — Anesthesia Preprocedure Evaluation (Signed)
Anesthesia Evaluation  Patient identified by MRN, date of birth, ID band Patient awake    Reviewed: Allergy & Precautions, H&P , NPO status , [name] Chart, lab work & pertinent test results, reviewed documented beta blocker date and time   History of Anesthesia Complications (+) PONV  Airway Mallampati: II  TM Distance: >3 FB Neck ROM: full    Dental no notable dental hx. (+) Dental Advisory Given, Poor Dentition   Pulmonary neg pulmonary ROS, former smoker,    Pulmonary exam normal breath sounds clear to auscultation       Cardiovascular hypertension, Pt. on medications and Pt. on home beta blockers + CAD, + Past MI and + Cardiac Stents  Normal cardiovascular exam Rhythm:regular Rate:Normal     Neuro/Psych Depression Left side weakness. MS. Peripheral neuropathy  Neuromuscular disease CVA, Residual Symptoms negative psych ROS   GI/Hepatic negative GI ROS, Neg liver ROS,   Endo/Other  diabetes, Well Controlled, Type 1, Insulin Dependent  Renal/GU Renal diseaseStage 2 chronic kidney disease  negative genitourinary   Musculoskeletal  (+) Arthritis ,   Abdominal   Peds  Hematology negative hematology ROS (+)   Anesthesia Other Findings Chronic pain syndrome  Reproductive/Obstetrics negative OB ROS                             Anesthesia Physical Anesthesia Plan  ASA: III  Anesthesia Plan: General   Post-op Pain Management:    Induction: Intravenous  Airway Management Planned: Nasal ETT  Additional Equipment:   Intra-op Plan:   Post-operative Plan: Extubation in OR  Informed Consent: I have reviewed the patients History and Physical, chart, labs and discussed the procedure including the risks, benefits and alternatives for the proposed anesthesia with the patient or authorized representative who has indicated his/her understanding and acceptance.   Dental Advisory  Given  Plan Discussed with: CRNA and Surgeon  Anesthesia Plan Comments:         Anesthesia Quick Evaluation

## 2015-05-15 NOTE — Transfer of Care (Signed)
Immediate Anesthesia Transfer of Care Note  Patient: [name] Bullers Ozawa Jr.  Procedure(s) Performed: Procedure(s) (LRB): MULTIPLE EXTRACTIONS (N/A)  Patient Location: PACU  Anesthesia Type: General  Level of Consciousness: awake, oriented, sedated and patient cooperative  Airway & Oxygen Therapy: Patient Spontanous Breathing and Patient connected to face mask oxygen  Post-op Assessment: Report given to PACU RN and Post -op Vital signs reviewed and stable  Post vital signs: Reviewed and stable  Complications: No apparent anesthesia complications Last Vitals:  Filed Vitals:   05/15/15 0729 05/15/15 1133  BP: 155/55 155/83  Pulse: 61 70  Temp: 37.4 C 36.7 C  Resp: 18 17

## 2015-05-15 NOTE — Discharge Instructions (Signed)
°  Post Anesthesia Home Care Instructions  Activity: Get plenty of rest for the remainder of the day. A responsible adult should stay with you for 24 hours following the procedure.  For the next 24 hours, DO NOT: -Drive a car -Paediatric nurse -Drink alcoholic beverages -Take any medication unless instructed by your physician -Make any legal decisions or sign important papers.  Meals: Start with liquid foods such as gelatin or soup. Progress to regular foods as tolerated. Avoid greasy, spicy, heavy foods. If nausea and/or vomiting occur, drink only clear liquids until the nausea and/or vomiting subsides. Call your physician if vomiting continues.  Special Instructions/Symptoms: Your throat may feel dry or sore from the anesthesia or the breathing tube placed in your throat during surgery. If this causes discomfort, gargle with warm salt water. The discomfort should disappear within 24 hours.  If you had a scopolamine patch placed behind your ear for the management of post- operative nausea and/or vomiting:  1. The medication in the patch is effective for 72 hours, after which it should be removed.  Wrap patch in a tissue and discard in the trash. Wash hands thoroughly with soap and water. 2. You may remove the patch earlier than 72 hours if you experience unpleasant side effects which may include dry mouth, dizziness or visual disturbances. 3. Avoid touching the patch. Wash your hands with soap and water after contact with the patch.   HOME CARE INSTRUCTIONS DENTAL PROCEDURES  MEDICATION: Some soreness and discomfort is normal following a dental procedure.  Use of a non-aspirin pain product, like acetaminophen, is recommended.  If pain is not relieved, please call the dentist who performed the procedure.  ORAL HYGIENE: Brushing of the teeth should be resumed the day after surgery.  Begin slowly and softly.  In children, brushing should be done by the parent after every meal.  DIET: A  balanced diet is very important during the healing process.   Liquids and soft foods are advisable.  Drink clear liquids at first, then progress to other liquids as tolerated.  If teeth were removed, do not use a straw for at least 2 days.  Try to limit between-meal snacks which are high in sugar.  ACTIVITY: Limit to quiet indoor activities for 24 hours following surgery.  RETURN TO SCHOOL OR WORK: You may return to school or work in a day or two, or as indicated by your dentist.  GENERAL EXPECTATIONS:  -Bleeding is to be expected after teeth are removed.  The bleeding should slow   down after several hours.  -Stitches may be in place, which will fall out by themselves.  If the child pulls   them out, do not be concerned.  CALL YOUR DOCTOR IS THESE OCCUR:  -Temperature is 101 degrees or more.  -Persistent bright red bleeding.  -Severe pain.  Return to the doctor's office  Call to make an appointment.  Patient Signature:  ________________________________________________________  Nurse's Signature:  ________________________________________________________

## 2015-05-18 ENCOUNTER — Encounter (HOSPITAL_BASED_OUTPATIENT_CLINIC_OR_DEPARTMENT_OTHER): Payer: Self-pay | Admitting: Dentistry

## 2015-05-18 NOTE — Op Note (Unsigned)
NAMEDAMIR, FEEZOR NO.:  [id number]  MEDICAL RECORD NO.:  [id number]  LOCATION:                                FACILITY:  [facility]  PHYSICIAN:  Donna Christen., D.D.S.DATE OF BIRTH:  1945/08/12  DATE OF PROCEDURE:  05/15/2015 DATE OF DISCHARGE:  05/15/2015                              OPERATIVE REPORT   DESCRIPTION OF PROCEDURE:  The patient was premedicated and brought to OR and placed on the table in supine position in which he remained throughout the entire procedure.  There was successful nasoendotracheal tube placed for general anesthesia.  The patient was prepped and draped in the usual manner for an intraoral procedure.  There was no throat pack placed.  Gauze shielding was used throughout.  There was no bite block.  Maxillary, facial, and palate wall infiltration and bilateral mandibular block with posterior facial infiltration.  Total of 9 x 1.8 mL 2% Xylocaine 1:100,000 epinephrine.  The mandible was anesthetized at the conclusion of treatment of the maxillary arch, and the maxillofacial and palatal flaps were elevated.  Remaining teeth were extracted in toto using elevator and forceps and rongeur.  A very small apical root tip more than likely remained at #13, but due to sinus proximity and poor visualization after initial attempt at removal, prior attempts were aborted.  Alveoloplasty using rotary and rongeur.  Irrigate and flaps for  suture 4-0 chromic gut interrupted suture.  The mandibular arch, facial and lingual, intrasulcular incisions and full-thickness flaps were elevated.  Remaining mandibular teeth were also extracted in toto. Teeth 18 and 19 crowns were removed and teeth were sectioned with crosscut fissure burr.  The teeth were extracted in toto.  There was a root tip at #28 which was retrieved in toto.  The lower molars were also sectioned with crosscut fissure burr, and root tips removed.  Again, alveoloplasty with rotary instrument and  rongeur.  Irrigated, flaps sutured, 4-0 chromic gut, interrupted.  The mouth was then irrigated and suctioned dry.  The patient was then extubated on the table and found to be breathing well on his own.  Estimated blood loss was approximately 250 mL.  There were no cultures or drains.  There were no biopsies.  The procedure went well and lasted approximately 1 hour and 50 minutes.  The patient was returned to recovery room where he stayed until discharged.          ______________________________ Donna Christen., D.D.S.     RJK/MEDQ  D:  05/15/2015  T:  05/16/2015  Job:  [id number]

## 2015-05-27 DIAGNOSIS — K1230 Oral mucositis (ulcerative), unspecified: Secondary | ICD-10-CM | POA: Diagnosis not present

## 2015-06-08 DIAGNOSIS — G894 Chronic pain syndrome: Secondary | ICD-10-CM | POA: Diagnosis not present

## 2015-06-08 DIAGNOSIS — K1230 Oral mucositis (ulcerative), unspecified: Secondary | ICD-10-CM | POA: Diagnosis not present

## 2015-06-08 DIAGNOSIS — Z79891 Long term (current) use of opiate analgesic: Secondary | ICD-10-CM | POA: Diagnosis not present

## 2015-06-08 DIAGNOSIS — E1042 Type 1 diabetes mellitus with diabetic polyneuropathy: Secondary | ICD-10-CM | POA: Diagnosis not present

## 2015-07-09 DIAGNOSIS — E1042 Type 1 diabetes mellitus with diabetic polyneuropathy: Secondary | ICD-10-CM | POA: Diagnosis not present

## 2015-07-09 DIAGNOSIS — G894 Chronic pain syndrome: Secondary | ICD-10-CM | POA: Diagnosis not present

## 2015-07-09 DIAGNOSIS — Z79891 Long term (current) use of opiate analgesic: Secondary | ICD-10-CM | POA: Diagnosis not present

## 2015-07-15 DIAGNOSIS — K1379 Other lesions of oral mucosa: Secondary | ICD-10-CM | POA: Diagnosis not present

## 2015-07-15 DIAGNOSIS — K1230 Oral mucositis (ulcerative), unspecified: Secondary | ICD-10-CM | POA: Diagnosis not present

## 2015-07-27 DIAGNOSIS — E859 Amyloidosis, unspecified: Secondary | ICD-10-CM | POA: Diagnosis not present

## 2015-07-27 DIAGNOSIS — K1379 Other lesions of oral mucosa: Secondary | ICD-10-CM | POA: Diagnosis not present

## 2015-08-04 DIAGNOSIS — E1042 Type 1 diabetes mellitus with diabetic polyneuropathy: Secondary | ICD-10-CM | POA: Diagnosis not present

## 2015-08-04 DIAGNOSIS — G894 Chronic pain syndrome: Secondary | ICD-10-CM | POA: Diagnosis not present

## 2015-08-04 DIAGNOSIS — Z79891 Long term (current) use of opiate analgesic: Secondary | ICD-10-CM | POA: Diagnosis not present

## 2015-08-18 DIAGNOSIS — E859 Amyloidosis, unspecified: Secondary | ICD-10-CM | POA: Diagnosis not present

## 2015-08-18 DIAGNOSIS — R945 Abnormal results of liver function studies: Secondary | ICD-10-CM | POA: Diagnosis not present

## 2015-08-18 DIAGNOSIS — E1029 Type 1 diabetes mellitus with other diabetic kidney complication: Secondary | ICD-10-CM | POA: Diagnosis not present

## 2015-08-18 DIAGNOSIS — N401 Enlarged prostate with lower urinary tract symptoms: Secondary | ICD-10-CM | POA: Diagnosis not present

## 2015-08-18 DIAGNOSIS — I69959 Hemiplegia and hemiparesis following unspecified cerebrovascular disease affecting unspecified side: Secondary | ICD-10-CM | POA: Diagnosis not present

## 2015-08-18 DIAGNOSIS — N182 Chronic kidney disease, stage 2 (mild): Secondary | ICD-10-CM | POA: Diagnosis not present

## 2015-08-18 DIAGNOSIS — I252 Old myocardial infarction: Secondary | ICD-10-CM | POA: Diagnosis not present

## 2015-08-18 DIAGNOSIS — I129 Hypertensive chronic kidney disease with stage 1 through stage 4 chronic kidney disease, or unspecified chronic kidney disease: Secondary | ICD-10-CM | POA: Diagnosis not present

## 2015-08-18 DIAGNOSIS — I2510 Atherosclerotic heart disease of native coronary artery without angina pectoris: Secondary | ICD-10-CM | POA: Diagnosis not present

## 2015-08-18 DIAGNOSIS — E1059 Type 1 diabetes mellitus with other circulatory complications: Secondary | ICD-10-CM | POA: Diagnosis not present

## 2015-08-18 DIAGNOSIS — G894 Chronic pain syndrome: Secondary | ICD-10-CM | POA: Diagnosis not present

## 2015-09-02 DIAGNOSIS — Z79891 Long term (current) use of opiate analgesic: Secondary | ICD-10-CM | POA: Diagnosis not present

## 2015-09-02 DIAGNOSIS — G894 Chronic pain syndrome: Secondary | ICD-10-CM | POA: Diagnosis not present

## 2015-09-02 DIAGNOSIS — E1042 Type 1 diabetes mellitus with diabetic polyneuropathy: Secondary | ICD-10-CM | POA: Diagnosis not present

## 2015-09-16 ENCOUNTER — Telehealth: Payer: Self-pay | Admitting: *Deleted

## 2015-09-16 NOTE — Telephone Encounter (Signed)
Opened in error

## 2015-09-18 ENCOUNTER — Encounter: Payer: Self-pay | Admitting: Hematology and Oncology

## 2015-09-18 ENCOUNTER — Telehealth: Payer: Self-pay | Admitting: Hematology and Oncology

## 2015-09-18 NOTE — Telephone Encounter (Signed)
Appointment scheduled with Alvy Bimler on 8/28. Spoke to the patient's spouse. She wanted the appointment at the end of the month. Demographics was verified by the patient's wife. Letter to the referring and to the patient.

## 2015-09-30 DIAGNOSIS — G894 Chronic pain syndrome: Secondary | ICD-10-CM | POA: Diagnosis not present

## 2015-09-30 DIAGNOSIS — Z79891 Long term (current) use of opiate analgesic: Secondary | ICD-10-CM | POA: Diagnosis not present

## 2015-09-30 DIAGNOSIS — E1042 Type 1 diabetes mellitus with diabetic polyneuropathy: Secondary | ICD-10-CM | POA: Diagnosis not present

## 2015-10-01 ENCOUNTER — Telehealth: Payer: Self-pay | Admitting: *Deleted

## 2015-10-01 NOTE — Telephone Encounter (Signed)
Called Misty at [facility] today to request any additional labs/pathology reports be faxed to this office prior to appt on 8/28. Note provides information about Dr. Buelah Manis completing a biopsy and pathology report sent to [facility].   Advised Misty to rtn call as soon as possible.   Spoke to Dr. Valora Piccolo office- they did not review the chart but based on the diagnosis for which he was referred they advised follow up with Hematology.

## 2015-10-05 ENCOUNTER — Telehealth: Payer: Self-pay | Admitting: Hematology and Oncology

## 2015-10-05 ENCOUNTER — Ambulatory Visit (HOSPITAL_BASED_OUTPATIENT_CLINIC_OR_DEPARTMENT_OTHER): Payer: Medicare Other | Admitting: Hematology and Oncology

## 2015-10-05 ENCOUNTER — Encounter: Payer: Self-pay | Admitting: Hematology and Oncology

## 2015-10-05 VITALS — BP 123/43 | HR 77 | Temp 98.5°F | Resp 19 | Wt 194.3 lb

## 2015-10-05 DIAGNOSIS — E858 Other amyloidosis: Secondary | ICD-10-CM | POA: Diagnosis not present

## 2015-10-05 DIAGNOSIS — Z8673 Personal history of transient ischemic attack (TIA), and cerebral infarction without residual deficits: Secondary | ICD-10-CM | POA: Diagnosis not present

## 2015-10-05 DIAGNOSIS — G622 Polyneuropathy due to other toxic agents: Secondary | ICD-10-CM

## 2015-10-05 DIAGNOSIS — D472 Monoclonal gammopathy: Secondary | ICD-10-CM

## 2015-10-05 DIAGNOSIS — E8581 Light chain (AL) amyloidosis: Secondary | ICD-10-CM | POA: Insufficient documentation

## 2015-10-05 DIAGNOSIS — R0600 Dyspnea, unspecified: Secondary | ICD-10-CM | POA: Insufficient documentation

## 2015-10-05 DIAGNOSIS — Z8042 Family history of malignant neoplasm of prostate: Secondary | ICD-10-CM

## 2015-10-05 DIAGNOSIS — I2510 Atherosclerotic heart disease of native coronary artery without angina pectoris: Secondary | ICD-10-CM | POA: Diagnosis not present

## 2015-10-05 DIAGNOSIS — R5383 Other fatigue: Secondary | ICD-10-CM | POA: Insufficient documentation

## 2015-10-05 DIAGNOSIS — I6990 Unspecified sequelae of unspecified cerebrovascular disease: Secondary | ICD-10-CM

## 2015-10-05 DIAGNOSIS — G63 Polyneuropathy in diseases classified elsewhere: Secondary | ICD-10-CM

## 2015-10-05 NOTE — Telephone Encounter (Signed)
Gave relaive avs report and appointments for September. Relative sent back to lab for 24hr urnine collection container.

## 2015-10-06 DIAGNOSIS — D472 Monoclonal gammopathy: Secondary | ICD-10-CM | POA: Insufficient documentation

## 2015-10-06 DIAGNOSIS — G63 Polyneuropathy in diseases classified elsewhere: Secondary | ICD-10-CM

## 2015-10-06 NOTE — Assessment & Plan Note (Signed)
The patient has severe, chronic peripheral neuropathy which was attributed to diabetes. However, amyloidosis can also cause peripheral neuropathy. For now, I recommend continue current pain management

## 2015-10-06 NOTE — Progress Notes (Signed)
[facility] CONSULT NOTE  Patient Care Team: Burnard Bunting, MD as PCP - General (Internal Medicine)  CHIEF COMPLAINTS/PURPOSE OF CONSULTATION:  Amyloidosis of the tongue  HISTORY OF PRESENTING ILLNESS:  [name] M Dobbins Jr. 70 y.o. male is here because of recent diagnosis of amyloidosis of the tongue. We do not have extensive outside records. According to his wife, the patient had multiple comorbidities including poorly controlled diabetes, myocardial infarction, stroke and multiple sclerosis. The patient presented to his local dentist for evaluation due to poor dentition and was noted to have abnormal area at the posterior one third of the tongue in the midline. The lesion was excised and biopsy report showed evidence of focal amyloid deposition. The patient is subsequently referred here for further evaluation He denies history of abnormal bone pain or bone fracture. Patient denies history of recurrent infection or atypical infections such as shingles of meningitis. Denies chills, night sweats, anorexia or abnormal weight loss. 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 It was thought that his neuropathy was related to diabetes. The patient also had history of stroke and myocardial infarction. He has permanent left-sided weakness. The patient has no diagnosis of congestive heart failure. He denies changes to his bowel habits. The patient denies any recent signs or symptoms of bleeding such as spontaneous epistaxis, hematuria or hematochezia. He was evaluated by different neurologist who thought he might have multiple sclerosis but he has not received any treatment for multiple sclerosis  MEDICAL HISTORY:  Past Medical History:  Diagnosis Date  . Arthritis   . Benign prostatic hypertrophy with hesitancy   . 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 Chronic pain syndrome followed by dr mark phillps at pain clinic    secondary to [place] and diabetic neuropathy  . Chronic periodontal disease   . CKD (chronic kidney disease), stage II    hypertensive per PCP note  . 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 Depression   . Diabetic neuropathy ([place])   . Essential tremor   . Gait disorder    uses walker  . History of CVA (cerebrovascular accident)    2011-  right subcortical cva--  residual left mininmal side weakness and no use of left head  . 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 History of diabetic ulcer of foot    2010  left foot  . History of MI (myocardial infarction)    2005  . Hypertension    dr Reynaldo Minium  . MS (multiple sclerosis) ([place])    dx 1985  . PONV (postoperative nausea and vomiting)   . S/P drug eluting coronary stent placement    2005  to [place]  . Type 1 diabetes mellitus on insulin therapy ([place])     SURGICAL HISTORY: Past Surgical History:  Procedure Laterality Date  . CATARACT EXTRACTION W/ INTRAOCULAR LENS  IMPLANT, BILATERAL  Nov 2016  . CORONARY ANGIOPLASTY WITH STENT PLACEMENT  10-18-2003  dr Lia Foyer   DES x1  to  [facility] (high-grade lesion), diffuse diabetic abnormalities throughout the entire coronary tree w/ diffuse calcification and segmental plaquing throughout but noncritical disease  . HARDWARE REMOVAL Left 04/20/2012   Procedure: Left foot removal deep retained screw;  Surgeon:  Newt Minion, MD;  Location: [facility];  Service: Orthopedics;  Laterality: Left;  Marland Kitchen MULTIPLE EXTRACTIONS WITH ALVEOLOPLASTY N/A 05/15/2015   Procedure: MULTIPLE EXTRACTIONS;  Surgeon: Mikey Bussing, DDS;  Location: [facility];  Service: Dentistry;  Laterality: N/A;  . ORIF ANKLE FRACTURE  12/30/2011   Procedure: OPEN REDUCTION INTERNAL FIXATION (ORIF) ANKLE FRACTURE;  Surgeon: Newt Minion, MD;  Location: [facility];  Service:  Orthopedics;  Laterality: Left;  Excision Bone, Abscess Left Charcot Foot, Place Antibiotic Beads, Fixation Charcot  . REMOVAL SUBMANDIDULAR GLAND, BILATERAL  1970's   benign    SOCIAL HISTORY: Social History   Social History  . Marital status: Married    Spouse name: N/A  . Number of children: 2  . Years of education: College   Occupational History  . retired Chief Financial Officer    Social History Main Topics  . Smoking status: Former Smoker    Packs/day: 0.50    Years: 26.00    Types: Cigars, Cigarettes    Quit date: 02/17/2012  . Smokeless tobacco: Never Used     Comment: stopped cigar's 2014/  cigarette's stopped 1990's  . Alcohol use No  . Drug use: No  . Sexual activity: Not on file   Other Topics Concern  . Not on file   Social History Narrative  . No narrative on file    FAMILY HISTORY: Family History  Problem Relation Age of Onset  . Tremor Mother   . Heart disease Father   . Celiac disease Brother   . Cancer - Prostate Paternal Grandfather     ALLERGIES:  has No Known Allergies.  MEDICATIONS:  Current Outpatient Prescriptions  Medication Sig Dispense Refill  . amLODipine (NORVASC) 5 MG tablet Take 5 mg by mouth every morning.     Marland Kitchen amoxicillin (AMOXIL) 500 MG tablet Take 500 mg by mouth 3 (three) times daily.    Marland Kitchen aspirin 81 MG tablet Take 81 mg by mouth daily.    Marland Kitchen atorvastatin (LIPITOR) 10 MG tablet Take 10 mg by mouth every morning.     . clopidogrel (PLAVIX) 75 MG tablet Take 75 mg by mouth daily.    . DULoxetine (CYMBALTA) 60 MG capsule Take 60 mg by mouth 2 (two) times daily.    . finasteride (PROSCAR) 5 MG tablet Take 5 mg by mouth every morning.     . insulin aspart (NOVOLOG FLEXPEN) 100 UNIT/ML FlexPen Inject into the skin 2 (two) times daily. Sliding scale    . Insulin NPH, Human,, Isophane, (HUMULIN N KWIKPEN) 100 UNIT/ML Kiwkpen Inject into the skin 2 (two) times daily. 38-39 units in AM  And 18-19  units in PM    . metoprolol tartrate (LOPRESSOR)  25 MG tablet Take 25 mg by mouth 2 (two) times daily.    . Multiple Vitamin (MULTIVITAMIN WITH MINERALS) TABS tablet Take 1 tablet by mouth daily.    . nitroGLYCERIN (NITROSTAT) 0.4 MG SL tablet Place 0.4 mg under the tongue every 5 (five) minutes as needed for chest pain (x 3 doses). For chest pain    . oxycodone (ROXICODONE) 30 MG immediate release tablet Take 30 mg by mouth 6 (six) times daily.    . polyethylene glycol (MIRALAX / GLYCOLAX) packet Take 17 g by mouth daily.    . Tapentadol HCl (NUCYNTA ER) 250 MG TB12 Take 1 tablet by mouth 2 (two) times daily.    . traZODone (DESYREL) 150 MG tablet Take 150 mg by mouth at bedtime.  No current facility-administered medications for this visit.     REVIEW OF SYSTEMS:   Eyes: Denies blurriness of vision, double vision or watery eyes Ears, nose, mouth, throat, and face: Denies mucositis or sore throat Respiratory: Denies cough, dyspnea or wheezes Cardiovascular: Denies palpitation, chest discomfort or lower extremity swelling Gastrointestinal:  Denies nausea, heartburn or change in bowel habits Skin: Denies abnormal skin rashes Lymphatics: Denies new lymphadenopathy or easy bruising Neurological:Denies numbness, tingling or new weaknesses Behavioral/Psych: Mood is stable, no new changes  All other systems were reviewed with the patient and are negative.  PHYSICAL EXAMINATION: ECOG PERFORMANCE STATUS: 2 - Symptomatic, <50% confined to bed  Vitals:   10/05/15 1414  BP: (!) 123/43  Pulse: 77  Resp: 19  Temp: 98.5 F (36.9 C)   Filed Weights   10/05/15 1414  Weight: 194 lb 4.8 oz (88.1 kg)    GENERAL:alert, no distress and comfortable SKIN: skin color, texture, turgor are normal, no rashes or significant lesions EYES: normal, conjunctiva are pink and non-injected, sclera clear OROPHARYNX:no exudate, no erythema and lips, buccal mucosa, and tongue normal  NECK: supple, thyroid normal size, non-tender, without nodularity LYMPH:   no palpable lymphadenopathy in the cervical, axillary or inguinal LUNGS: clear to auscultation and percussion with normal breathing effort HEART: regular rate & rhythm and no murmurs and no lower extremity edema ABDOMEN:abdomen soft, non-tender and normal bowel sounds Musculoskeletal:no cyanosis of digits and no clubbing  PSYCH: alert & oriented x 3 with fluent speech NEURO: He has left sided weakness  LABORATORY DATA:  I have reviewed the data as listed Lab Results  Component Value Date   WBC 6.5 04/20/2013   HGB 15.0 05/15/2015   HCT 44.0 05/15/2015   MCV 93.6 04/20/2013   PLT 328 04/20/2013    ASSESSMENT & PLAN:  Primary amyloidosis ([place]) I will obtain outside records related to the final pathology report. We will try to locate the tissue block and consider, if possible, of getting the sample sent to the [facility] for mass spectrometry to determine the subtype of amyloidosis. I spent some time discussing the pathophysiology of amyloidosis with the patient and his wife. 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 However, amyloidosis can also cause peripheral neuropathy. For now, I recommend continue current pain management  Late effects of cerebrovascular accident The patient has significant neurological deficit from prior history of stroke and severe peripheral neuropathy. I will try to get all his tests done this Friday and see him back within 2 weeks to review test results  Coronary atherosclerosis He has significant cardiac risk factors for heart disease. Amyloidosis sometimes can cause heart damage. I will order EKG and cardiac enzymes along with BNP for further evaluation.  He may need dedicated echocardiogram to rule out cardiac  amyloidosis    Orders Placed This Encounter  Procedures  . DG Bone Survey Met    Standing Status:   Future    Standing Expiration Date:   12/04/2016    Order Specific Question:   Reason for Exam (SYMPTOM  OR DIAGNOSIS REQUIRED)    Answer:   staging myeloma    Order Specific Question:   Preferred imaging location?    Answer:   [facility]  . CBC with Differential/Platelet    Standing Status:  Future    Standing Expiration Date:   12/04/2016  . Comprehensive metabolic panel    Standing Status:   Future    Standing Expiration Date:   12/04/2016  . Kappa/lambda light chains    Standing Status:   Future    Standing Expiration Date:   12/04/2016  . Multiple Myeloma Panel (SPEP&IFE w/QIG)    Standing Status:   Future    Standing Expiration Date:   12/04/2016  . Beta 2 microglobulin, serum    Standing Status:   Future    Standing Expiration Date:   12/04/2016  . IFE/Light Chains/TP Qn, 24-Hr Ur    Standing Status:   Future    Standing Expiration Date:   12/04/2016  . Troponin I    Standing Status:   Future    Standing Expiration Date:   12/04/2016  . TSH    Standing Status:   Future    Standing Expiration Date:   12/04/2016  . BNP (Brain natriuretic peptide)    Standing Status:   Future    Standing Expiration Date:   12/04/2016  . EKG 12-Lead  . EKG 12-Lead    Ordered by an unspecified provider     All questions were answered. The patient knows to call the clinic with any problems, questions or concerns. I spent 55 minutes counseling the patient face to face. The total time spent in the appointment was 60 minutes and more than 50% was on counseling.     [place], [place], MD 10/06/15 11:04 AM

## 2015-10-06 NOTE — Assessment & Plan Note (Signed)
He has significant cardiac risk factors for heart disease. Amyloidosis sometimes can cause heart damage. I will order EKG and cardiac enzymes along with BNP for further evaluation.  He may need dedicated echocardiogram to rule out cardiac amyloidosis

## 2015-10-06 NOTE — Assessment & Plan Note (Signed)
I will obtain outside records related to the final pathology report. We will try to locate the tissue block and consider, if possible, of getting the sample sent to the [facility] for mass spectrometry to determine the subtype of amyloidosis. I spent some time discussing the pathophysiology of amyloidosis with the patient and his wife. I will order for workup including blood work, 24-hour urine collection and skeletal survey to look for signs of organ damage

## 2015-10-06 NOTE — Assessment & Plan Note (Signed)
The patient has significant neurological deficit from prior history of stroke and severe peripheral neuropathy. I will try to get all his tests done this Friday and see him back within 2 weeks to review test results

## 2015-10-07 ENCOUNTER — Telehealth: Payer: Self-pay | Admitting: *Deleted

## 2015-10-07 NOTE — Telephone Encounter (Signed)
Called Radiology Scheduler to get Bone Survey scheduled for Friday as ordered by Dr. Alvy Bimler.  It was scheduled for 8 am and I called [name] to notify and he says that is too early.  He says he can't even move that early in the morning and he also lives far away from [facility].   Called Radiology Scheduling and got Xray r/s to 11 am on Friday.   Lab appt moved from 11 am to 12 pm after xray.  LVM for wife informing of appts and asked her to return nurse's call to confirm.

## 2015-10-07 NOTE — Telephone Encounter (Signed)
Wife called back and LVM confirming appts on Friday 9/1.

## 2015-10-09 ENCOUNTER — Other Ambulatory Visit: Payer: Medicare Other

## 2015-10-09 ENCOUNTER — Other Ambulatory Visit (HOSPITAL_BASED_OUTPATIENT_CLINIC_OR_DEPARTMENT_OTHER): Payer: Medicare Other

## 2015-10-09 ENCOUNTER — Ambulatory Visit (HOSPITAL_COMMUNITY)
Admission: RE | Admit: 2015-10-09 | Discharge: 2015-10-09 | Disposition: A | Discharge status: Home / Self Care | Payer: Medicare Other | Source: Ambulatory Visit | Attending: Hematology and Oncology | Admitting: Hematology and Oncology

## 2015-10-09 ENCOUNTER — Ambulatory Visit (HOSPITAL_COMMUNITY): Payer: Medicare Other

## 2015-10-09 DIAGNOSIS — I700 Atherosclerosis of aorta: Secondary | ICD-10-CM | POA: Diagnosis not present

## 2015-10-09 DIAGNOSIS — R0600 Dyspnea, unspecified: Secondary | ICD-10-CM

## 2015-10-09 DIAGNOSIS — E858 Other amyloidosis: Secondary | ICD-10-CM | POA: Insufficient documentation

## 2015-10-09 DIAGNOSIS — I2510 Atherosclerotic heart disease of native coronary artery without angina pectoris: Secondary | ICD-10-CM | POA: Diagnosis not present

## 2015-10-09 DIAGNOSIS — M8580 Other specified disorders of bone density and structure, unspecified site: Secondary | ICD-10-CM | POA: Diagnosis not present

## 2015-10-09 DIAGNOSIS — R5383 Other fatigue: Secondary | ICD-10-CM | POA: Diagnosis not present

## 2015-10-09 DIAGNOSIS — C9000 Multiple myeloma not having achieved remission: Secondary | ICD-10-CM | POA: Diagnosis not present

## 2015-10-09 DIAGNOSIS — E8581 Light chain (AL) amyloidosis: Secondary | ICD-10-CM

## 2015-10-09 LAB — CBC WITH DIFFERENTIAL/PLATELET
BASO%: 0.7 % (ref 0.0–2.0)
BASOS ABS: 0 10*3/uL (ref 0.0–0.1)
EOS%: 4.6 % (ref 0.0–7.0)
Eosinophils Absolute: 0.3 10*3/uL (ref 0.0–0.5)
HEMATOCRIT: 43.7 % (ref 38.4–49.9)
HGB: 14.4 g/dL (ref 13.0–17.1)
LYMPH#: 1.2 10*3/uL (ref 0.9–3.3)
LYMPH%: 18.6 % (ref 14.0–49.0)
MCH: 32 pg (ref 27.2–33.4)
MCHC: 33 g/dL (ref 32.0–36.0)
MCV: 97.1 fL (ref 79.3–98.0)
MONO#: 0.5 10*3/uL (ref 0.1–0.9)
MONO%: 8.3 % (ref 0.0–14.0)
NEUT#: 4.4 10*3/uL (ref 1.5–6.5)
NEUT%: 67.8 % (ref 39.0–75.0)
Platelets: 275 10*3/uL (ref 140–400)
RBC: 4.5 10*6/uL (ref 4.20–5.82)
RDW: 14 % (ref 11.0–14.6)
WBC: 6.5 10*3/uL (ref 4.0–10.3)

## 2015-10-09 LAB — COMPREHENSIVE METABOLIC PANEL
ALT: 9 U/L (ref 0–55)
AST: 19 U/L (ref 5–34)
Albumin: 2.9 g/dL — ABNORMAL LOW (ref 3.5–5.0)
Alkaline Phosphatase: 211 U/L — ABNORMAL HIGH (ref 40–150)
Anion Gap: 8 mEq/L (ref 3–11)
BUN: 9.6 mg/dL (ref 7.0–26.0)
CALCIUM: 8.8 mg/dL (ref 8.4–10.4)
CHLORIDE: 100 meq/L (ref 98–109)
CO2: 30 mEq/L — ABNORMAL HIGH (ref 22–29)
Creatinine: 1.2 mg/dL (ref 0.7–1.3)
EGFR: 62 mL/min/{1.73_m2} — ABNORMAL LOW (ref >90)
Glucose: 252 mg/dl — ABNORMAL HIGH (ref 70–140)
POTASSIUM: 4.6 meq/L (ref 3.5–5.1)
Sodium: 138 mEq/L (ref 136–145)
Total Bilirubin: 0.46 mg/dL (ref 0.20–1.20)
Total Protein: 7.3 g/dL (ref 6.4–8.3)

## 2015-10-09 LAB — TSH: TSH: 1.022 m[IU]/L (ref 0.320–4.118)

## 2015-10-10 LAB — TROPONIN I

## 2015-10-10 LAB — BRAIN NATRIURETIC PEPTIDE: BNP: 27.4 pg/mL (ref 0.0–100.0)

## 2015-10-13 ENCOUNTER — Telehealth: Payer: Self-pay | Admitting: *Deleted

## 2015-10-13 LAB — KAPPA/LAMBDA LIGHT CHAINS
IG KAPPA FREE LIGHT CHAIN: 63.1 mg/L — AB (ref 3.3–19.4)
Ig Lambda Free Light Chain: 40.3 mg/L — ABNORMAL HIGH (ref 5.7–26.3)
Kappa/Lambda FluidC Ratio: 1.57 (ref 0.26–1.65)

## 2015-10-13 LAB — UIFE/LIGHT CHAINS/TP QN, 24-HR UR
FR KAPPA LT CH,24HR: 131 mg/24 hr
FR LAMBDA LT CH,24HR: 7 mg/(24.h)
Free Kappa Lt Chains,Ur: 74.8 mg/L — ABNORMAL HIGH (ref 1.35–24.19)
Free Lambda Lt Chains,Ur: 3.86 mg/L (ref 0.24–6.66)
KAPPA/LAMBDA RATIO, U: 19.38 — AB (ref 2.04–10.37)
PROTEIN 24H UR: 137 mg/(24.h) (ref 30–150)
PROTEIN UR: 7.8 mg/dL

## 2015-10-13 LAB — BETA 2 MICROGLOBULIN, SERUM: Beta-2: 3.8 mg/L — ABNORMAL HIGH (ref 0.6–2.4)

## 2015-10-13 NOTE — Telephone Encounter (Signed)
[facility], Attn: Eric  Specimen needed from surgery date 07/20/15   Accession # [id number].  We will be sending this sample to:   [facility]: [facility] [place] [address]  Contact: Sonia Baller [phone]  For mass spectometry, amyloidosis  Our contact: Dr Luther [facility] [address], [place] [address]  Phone: [phone] Constantino Starace or [place]

## 2015-10-16 LAB — MULTIPLE MYELOMA PANEL, SERUM
ALPHA 1: 0.2 g/dL (ref 0.0–0.4)
Albumin SerPl Elph-Mcnc: 3 g/dL (ref 2.9–4.4)
Albumin/Glob SerPl: 0.9 (ref 0.7–1.7)
Alpha2 Glob SerPl Elph-Mcnc: 0.8 g/dL (ref 0.4–1.0)
B-Globulin SerPl Elph-Mcnc: 0.9 g/dL (ref 0.7–1.3)
GAMMA GLOB SERPL ELPH-MCNC: 1.6 g/dL (ref 0.4–1.8)
GLOBULIN, TOTAL: 3.4 g/dL (ref 2.2–3.9)
IgA, Qn, Serum: 282 mg/dL (ref 61–437)
IgM, Qn, Serum: 239 mg/dL — ABNORMAL HIGH (ref 20–172)
Total Protein: 6.4 g/dL (ref 6.0–8.5)

## 2015-10-20 ENCOUNTER — Ambulatory Visit: Payer: Medicare Other | Admitting: Hematology and Oncology

## 2015-10-20 ENCOUNTER — Telehealth: Payer: Self-pay | Admitting: *Deleted

## 2015-10-20 NOTE — Telephone Encounter (Signed)
I spoke with the patient's wife to address all her concerns. She was under the impression that a staff member from Dr. Dorian Heckle office was trying to contact me 3 times and was not able to get an answer. I called Dr. Dorian Heckle office myself to clarify the situation. They were calling me to inquire about the address of [facility] pathology lab to send the tissue biopsy for diagnosis of amyloidosis. I spoke with Dr. Dorian Heckle nurse. I have enlisted the help of my staff nurse to arrange for transport of the tissue directly to [facility] without going through Dr. Dorian Heckle office. I addressed all her questions. Later, I spoke with the patient's wife. The patient's wife stated that it is difficult for her to bring the patient in for follow-up. I reviewed with her test results briefly. So far the blood tests and urine tests are inconclusive for amyloidosis. I told her it is reasonable not to keep her appointment today. I will call her myself as soon as I have tissue diagnosis report available

## 2015-10-20 NOTE — Telephone Encounter (Signed)
Spouse Hoyle Sauer Carvell called requesting "need for [place] to come in today.  Call me at work [phone] by 12:30 pm in order to go home to bring him in.  He's disabled with M.S.  Dr. Keane Police [phone]) oral surgeon has tried three times to call Dr.Gorsuch and needs to talk with her about the sample biopsy before the sample expires to send to [facility].  Could you ask her to call."  Collaborative nurse notified of call.  Sample has been requested from [facility] but not yet received.

## 2015-10-22 ENCOUNTER — Telehealth: Payer: Self-pay | Admitting: *Deleted

## 2015-10-22 NOTE — Telephone Encounter (Signed)
Linda from [facility] called back.  She says they did received request for slides on 9/5, but they were never sent out.  She is not sure why they had not been sent.  She is sending them today by [employer] and says we should have them by Monday.

## 2015-10-22 NOTE — Telephone Encounter (Signed)
Called [facility] Dept to check on status of pathology slide requested on 9/5. VM for [name] says he is on vacation and to call [phone].   I called this number and s/w someone named [name].  Asked her to check when slide was sent out and make sure it was sent to our address. We have not received it yet.  We need it as soon as possible so we can then send it to [facility] clinic for mass spectometry. [name] said she cannot find record of when or where it was sent.  She will check to see if they still have the slide or try to find record of where it was sent?  She said she will call nurse back when she finds this information.

## 2015-10-30 ENCOUNTER — Telehealth: Payer: Self-pay | Admitting: *Deleted

## 2015-10-30 NOTE — Telephone Encounter (Signed)
Called Eric at [facility]. We received slides rather than a paraffin block sample.  Randall Hiss was out of the office and someone else sent out the wrong sample. He requested that I email him our information and he will ship it out today.   I emailed to( [email or web address] ) the following:   [facility], Attn: Eric  Specimen needed from surgery date 07/20/15   Accession # [id number].  We will be sending this sample to:   [facility]: [facility] [place] [address]  Contact: Sonia Baller [phone]  For mass spectometry, amyloidosis  Our contact: Dr Luther [facility] [address], [place] [address]  Phone: [phone] Cherre Huger or Ethlyn Gallery in [facility] pathology was going to help [place] to ship this to [facility]. They will need to set it up as a consult and will be able to track the sample as a safeguard for [place]. Take sample to her when we receive it.

## 2015-11-02 DIAGNOSIS — E1042 Type 1 diabetes mellitus with diabetic polyneuropathy: Secondary | ICD-10-CM | POA: Diagnosis not present

## 2015-11-02 DIAGNOSIS — Z79891 Long term (current) use of opiate analgesic: Secondary | ICD-10-CM | POA: Diagnosis not present

## 2015-11-02 DIAGNOSIS — G894 Chronic pain syndrome: Secondary | ICD-10-CM | POA: Diagnosis not present

## 2015-11-03 DIAGNOSIS — Z79899 Other long term (current) drug therapy: Secondary | ICD-10-CM | POA: Diagnosis not present

## 2015-11-03 DIAGNOSIS — E119 Type 2 diabetes mellitus without complications: Secondary | ICD-10-CM | POA: Diagnosis not present

## 2015-11-03 DIAGNOSIS — K529 Noninfective gastroenteritis and colitis, unspecified: Secondary | ICD-10-CM | POA: Insufficient documentation

## 2015-11-03 DIAGNOSIS — K8020 Calculus of gallbladder without cholecystitis without obstruction: Secondary | ICD-10-CM | POA: Diagnosis not present

## 2015-11-03 DIAGNOSIS — K2970 Gastritis, unspecified, without bleeding: Secondary | ICD-10-CM | POA: Diagnosis not present

## 2015-11-03 DIAGNOSIS — Z87891 Personal history of nicotine dependence: Secondary | ICD-10-CM | POA: Diagnosis not present

## 2015-11-03 DIAGNOSIS — G35 Multiple sclerosis: Secondary | ICD-10-CM | POA: Diagnosis not present

## 2015-11-03 DIAGNOSIS — R110 Nausea: Secondary | ICD-10-CM | POA: Diagnosis not present

## 2015-11-03 DIAGNOSIS — K5900 Constipation, unspecified: Secondary | ICD-10-CM | POA: Diagnosis not present

## 2015-11-03 DIAGNOSIS — K3580 Unspecified acute appendicitis: Secondary | ICD-10-CM | POA: Diagnosis not present

## 2015-11-03 DIAGNOSIS — R339 Retention of urine, unspecified: Secondary | ICD-10-CM | POA: Diagnosis not present

## 2015-11-03 DIAGNOSIS — R1084 Generalized abdominal pain: Secondary | ICD-10-CM | POA: Diagnosis not present

## 2015-11-03 DIAGNOSIS — I10 Essential (primary) hypertension: Secondary | ICD-10-CM | POA: Diagnosis not present

## 2015-11-03 DIAGNOSIS — R935 Abnormal findings on diagnostic imaging of other abdominal regions, including retroperitoneum: Secondary | ICD-10-CM | POA: Diagnosis not present

## 2015-11-03 DIAGNOSIS — Z8673 Personal history of transient ischemic attack (TIA), and cerebral infarction without residual deficits: Secondary | ICD-10-CM | POA: Diagnosis not present

## 2015-11-04 ENCOUNTER — Telehealth: Payer: Self-pay | Admitting: *Deleted

## 2015-11-04 NOTE — Telephone Encounter (Signed)
Tissue sample/ block received via Fed Ex from [facility] yesterday.  Took to [employer] at [facility] Pathology Dept to send to [facility] (for mass spectrometry to assess for amyloidosis).   [name] says that in order to ship out and be able to track the specimen to [facility]- they will need to treat it as a consult for our Pathologist.   Pathologist here will need to review specimen and then they can send and track it to [facility].

## 2015-11-05 ENCOUNTER — Other Ambulatory Visit: Payer: Self-pay | Admitting: Hematology and Oncology

## 2015-11-05 DIAGNOSIS — K1379 Other lesions of oral mucosa: Secondary | ICD-10-CM | POA: Diagnosis not present

## 2015-11-12 ENCOUNTER — Telehealth: Payer: Self-pay | Admitting: Hematology and Oncology

## 2015-11-12 ENCOUNTER — Encounter (HOSPITAL_COMMUNITY): Payer: Self-pay

## 2015-11-12 NOTE — Telephone Encounter (Signed)
I reviewed the pathology reviewed of his case with Dr. Lyndon Code who had personally reviewed the slides and felt that he could see amyloid deposition. However, [place] red stain was negative. His pathology samples are sent to the [facility]. According to the interpretation, amyloid was not detected and therefore mass spectrometry was not performed. His blood work, urine tests and skeletal survey did not confirm the findings of amyloidosis. I reviewed all the above with his wife. His wife share with me that even if the patient has amyloidosis, he would not want any treatment. For that reason, we will not pursue any further testing. I addressed all her questions and concerns

## 2015-11-24 ENCOUNTER — Encounter (HOSPITAL_COMMUNITY): Payer: Self-pay

## 2015-11-30 DIAGNOSIS — K5900 Constipation, unspecified: Secondary | ICD-10-CM | POA: Diagnosis not present

## 2015-11-30 DIAGNOSIS — Z79891 Long term (current) use of opiate analgesic: Secondary | ICD-10-CM | POA: Diagnosis not present

## 2015-11-30 DIAGNOSIS — E1042 Type 1 diabetes mellitus with diabetic polyneuropathy: Secondary | ICD-10-CM | POA: Diagnosis not present

## 2015-11-30 DIAGNOSIS — G894 Chronic pain syndrome: Secondary | ICD-10-CM | POA: Diagnosis not present

## 2015-12-24 DIAGNOSIS — I2510 Atherosclerotic heart disease of native coronary artery without angina pectoris: Secondary | ICD-10-CM | POA: Diagnosis not present

## 2015-12-24 DIAGNOSIS — I69959 Hemiplegia and hemiparesis following unspecified cerebrovascular disease affecting unspecified side: Secondary | ICD-10-CM | POA: Diagnosis not present

## 2015-12-24 DIAGNOSIS — I252 Old myocardial infarction: Secondary | ICD-10-CM | POA: Diagnosis not present

## 2015-12-24 DIAGNOSIS — Z23 Encounter for immunization: Secondary | ICD-10-CM | POA: Diagnosis not present

## 2015-12-24 DIAGNOSIS — I129 Hypertensive chronic kidney disease with stage 1 through stage 4 chronic kidney disease, or unspecified chronic kidney disease: Secondary | ICD-10-CM | POA: Diagnosis not present

## 2015-12-24 DIAGNOSIS — Z6827 Body mass index (BMI) 27.0-27.9, adult: Secondary | ICD-10-CM | POA: Diagnosis not present

## 2015-12-24 DIAGNOSIS — R945 Abnormal results of liver function studies: Secondary | ICD-10-CM | POA: Diagnosis not present

## 2015-12-24 DIAGNOSIS — N401 Enlarged prostate with lower urinary tract symptoms: Secondary | ICD-10-CM | POA: Diagnosis not present

## 2015-12-24 DIAGNOSIS — E1059 Type 1 diabetes mellitus with other circulatory complications: Secondary | ICD-10-CM | POA: Diagnosis not present

## 2015-12-24 DIAGNOSIS — E1029 Type 1 diabetes mellitus with other diabetic kidney complication: Secondary | ICD-10-CM | POA: Diagnosis not present

## 2015-12-24 DIAGNOSIS — R309 Painful micturition, unspecified: Secondary | ICD-10-CM | POA: Diagnosis not present

## 2015-12-24 DIAGNOSIS — N182 Chronic kidney disease, stage 2 (mild): Secondary | ICD-10-CM | POA: Diagnosis not present

## 2015-12-28 DIAGNOSIS — E1042 Type 1 diabetes mellitus with diabetic polyneuropathy: Secondary | ICD-10-CM | POA: Diagnosis not present

## 2015-12-28 DIAGNOSIS — G894 Chronic pain syndrome: Secondary | ICD-10-CM | POA: Diagnosis not present

## 2015-12-28 DIAGNOSIS — Z79891 Long term (current) use of opiate analgesic: Secondary | ICD-10-CM | POA: Diagnosis not present

## 2015-12-28 DIAGNOSIS — K5900 Constipation, unspecified: Secondary | ICD-10-CM | POA: Diagnosis not present

## 2016-01-07 DIAGNOSIS — N390 Urinary tract infection, site not specified: Secondary | ICD-10-CM | POA: Diagnosis not present

## 2016-01-07 DIAGNOSIS — R8290 Unspecified abnormal findings in urine: Secondary | ICD-10-CM | POA: Diagnosis not present

## 2016-01-20 DIAGNOSIS — E1040 Type 1 diabetes mellitus with diabetic neuropathy, unspecified: Secondary | ICD-10-CM | POA: Diagnosis not present

## 2016-01-20 DIAGNOSIS — R2689 Other abnormalities of gait and mobility: Secondary | ICD-10-CM | POA: Diagnosis not present

## 2016-01-20 DIAGNOSIS — G35 Multiple sclerosis: Secondary | ICD-10-CM | POA: Diagnosis not present

## 2016-01-26 DIAGNOSIS — Z79891 Long term (current) use of opiate analgesic: Secondary | ICD-10-CM | POA: Diagnosis not present

## 2016-01-26 DIAGNOSIS — K5900 Constipation, unspecified: Secondary | ICD-10-CM | POA: Diagnosis not present

## 2016-01-26 DIAGNOSIS — E1042 Type 1 diabetes mellitus with diabetic polyneuropathy: Secondary | ICD-10-CM | POA: Diagnosis not present

## 2016-01-26 DIAGNOSIS — G894 Chronic pain syndrome: Secondary | ICD-10-CM | POA: Diagnosis not present

## 2016-02-23 DIAGNOSIS — E1042 Type 1 diabetes mellitus with diabetic polyneuropathy: Secondary | ICD-10-CM | POA: Diagnosis not present

## 2016-02-23 DIAGNOSIS — G894 Chronic pain syndrome: Secondary | ICD-10-CM | POA: Diagnosis not present

## 2016-02-23 DIAGNOSIS — Z79891 Long term (current) use of opiate analgesic: Secondary | ICD-10-CM | POA: Diagnosis not present

## 2016-02-23 DIAGNOSIS — K5900 Constipation, unspecified: Secondary | ICD-10-CM | POA: Diagnosis not present

## 2016-03-22 DIAGNOSIS — G894 Chronic pain syndrome: Secondary | ICD-10-CM | POA: Diagnosis not present

## 2016-03-22 DIAGNOSIS — E1042 Type 1 diabetes mellitus with diabetic polyneuropathy: Secondary | ICD-10-CM | POA: Diagnosis not present

## 2016-03-22 DIAGNOSIS — K5900 Constipation, unspecified: Secondary | ICD-10-CM | POA: Diagnosis not present

## 2016-03-22 DIAGNOSIS — Z79891 Long term (current) use of opiate analgesic: Secondary | ICD-10-CM | POA: Diagnosis not present

## 2016-04-07 ENCOUNTER — Ambulatory Visit (INDEPENDENT_AMBULATORY_CARE_PROVIDER_SITE_OTHER): Payer: Medicare Other | Admitting: Podiatry

## 2016-04-07 ENCOUNTER — Encounter: Payer: Self-pay | Admitting: Podiatry

## 2016-04-07 ENCOUNTER — Ambulatory Visit: Payer: Medicare Other | Admitting: Podiatry

## 2016-04-07 VITALS — Ht 73.0 in | Wt 194.0 lb

## 2016-04-07 DIAGNOSIS — M79676 Pain in unspecified toe(s): Secondary | ICD-10-CM | POA: Diagnosis not present

## 2016-04-07 DIAGNOSIS — B351 Tinea unguium: Secondary | ICD-10-CM | POA: Diagnosis not present

## 2016-04-07 DIAGNOSIS — E1151 Type 2 diabetes mellitus with diabetic peripheral angiopathy without gangrene: Secondary | ICD-10-CM | POA: Diagnosis not present

## 2016-04-07 NOTE — Progress Notes (Signed)
   Subjective:    Patient ID: [name] Mc., male    DOB: 03-11-1945, 71 y.o.   MRN: [id number]  HPI  This patient presents to the office with long thick nails.  He says the nails have grown long and thick since his last visit to the podiatrist.  Patient is type 1 diabetic with no feeling below his knees.  No redness or ulcers  Or infections.  Patient says nails are painful walking and wearing his shoes.  Review of Systems  All other systems reviewed and are negative.      Objective:   Physical Exam GENERAL APPEARANCE: Alert, conversant. Appropriately groomed. No acute distress.  VASCULAR: Pedal pulses absent bilateral.  Capillary refill time is slowed to his digits.,  Cold feet noted.  NEUROLOGIC: sensation is absent epicritically and protectively to 5.07 monofilament at 5/5 sites bilateral.    MUSCULOSKELETAL: acceptable muscle strength, tone and stability bilateral.  Intrinsic muscluature intact bilateral.  Rectus appearance of foot and digits noted bilateral. Severe pes planus B/L with surgery having been performed on his left foot by Dr. Sharol Given.  DERMATOLOGIC: skin color, texture, and turgor are within normal limits.  No preulcerative lesions or ulcers  are seen, no interdigital maceration noted.  No open lesions present.   No drainage noted.  NAILS  Thick disfigured discolored nails all ten digits both feet in the absence of infection.       Assessment & Plan:  Onychomycosis  B/L  IE  Debridement and grinding of nails both feet.   Gardiner Barefoot DPM

## 2016-04-19 DIAGNOSIS — Z79891 Long term (current) use of opiate analgesic: Secondary | ICD-10-CM | POA: Diagnosis not present

## 2016-04-19 DIAGNOSIS — K5900 Constipation, unspecified: Secondary | ICD-10-CM | POA: Diagnosis not present

## 2016-04-19 DIAGNOSIS — G894 Chronic pain syndrome: Secondary | ICD-10-CM | POA: Diagnosis not present

## 2016-04-19 DIAGNOSIS — E1042 Type 1 diabetes mellitus with diabetic polyneuropathy: Secondary | ICD-10-CM | POA: Diagnosis not present

## 2016-04-27 DIAGNOSIS — N401 Enlarged prostate with lower urinary tract symptoms: Secondary | ICD-10-CM | POA: Diagnosis not present

## 2016-04-27 DIAGNOSIS — F329 Major depressive disorder, single episode, unspecified: Secondary | ICD-10-CM | POA: Diagnosis not present

## 2016-04-27 DIAGNOSIS — R945 Abnormal results of liver function studies: Secondary | ICD-10-CM | POA: Diagnosis not present

## 2016-04-27 DIAGNOSIS — I252 Old myocardial infarction: Secondary | ICD-10-CM | POA: Diagnosis not present

## 2016-04-27 DIAGNOSIS — I2510 Atherosclerotic heart disease of native coronary artery without angina pectoris: Secondary | ICD-10-CM | POA: Diagnosis not present

## 2016-04-27 DIAGNOSIS — Z1389 Encounter for screening for other disorder: Secondary | ICD-10-CM | POA: Diagnosis not present

## 2016-04-27 DIAGNOSIS — Z6828 Body mass index (BMI) 28.0-28.9, adult: Secondary | ICD-10-CM | POA: Diagnosis not present

## 2016-04-27 DIAGNOSIS — N182 Chronic kidney disease, stage 2 (mild): Secondary | ICD-10-CM | POA: Diagnosis not present

## 2016-04-27 DIAGNOSIS — I69959 Hemiplegia and hemiparesis following unspecified cerebrovascular disease affecting unspecified side: Secondary | ICD-10-CM | POA: Diagnosis not present

## 2016-04-27 DIAGNOSIS — E1059 Type 1 diabetes mellitus with other circulatory complications: Secondary | ICD-10-CM | POA: Diagnosis not present

## 2016-04-27 DIAGNOSIS — E1029 Type 1 diabetes mellitus with other diabetic kidney complication: Secondary | ICD-10-CM | POA: Diagnosis not present

## 2016-04-27 DIAGNOSIS — I129 Hypertensive chronic kidney disease with stage 1 through stage 4 chronic kidney disease, or unspecified chronic kidney disease: Secondary | ICD-10-CM | POA: Diagnosis not present

## 2016-05-18 DIAGNOSIS — G894 Chronic pain syndrome: Secondary | ICD-10-CM | POA: Diagnosis not present

## 2016-05-18 DIAGNOSIS — Z79891 Long term (current) use of opiate analgesic: Secondary | ICD-10-CM | POA: Diagnosis not present

## 2016-05-18 DIAGNOSIS — K5900 Constipation, unspecified: Secondary | ICD-10-CM | POA: Diagnosis not present

## 2016-05-18 DIAGNOSIS — E1042 Type 1 diabetes mellitus with diabetic polyneuropathy: Secondary | ICD-10-CM | POA: Diagnosis not present

## 2016-06-14 DIAGNOSIS — E1042 Type 1 diabetes mellitus with diabetic polyneuropathy: Secondary | ICD-10-CM | POA: Diagnosis not present

## 2016-06-14 DIAGNOSIS — G894 Chronic pain syndrome: Secondary | ICD-10-CM | POA: Diagnosis not present

## 2016-06-14 DIAGNOSIS — Z79891 Long term (current) use of opiate analgesic: Secondary | ICD-10-CM | POA: Diagnosis not present

## 2016-06-14 DIAGNOSIS — K5900 Constipation, unspecified: Secondary | ICD-10-CM | POA: Diagnosis not present

## 2016-07-13 DIAGNOSIS — Z79891 Long term (current) use of opiate analgesic: Secondary | ICD-10-CM | POA: Diagnosis not present

## 2016-07-13 DIAGNOSIS — E1042 Type 1 diabetes mellitus with diabetic polyneuropathy: Secondary | ICD-10-CM | POA: Diagnosis not present

## 2016-07-13 DIAGNOSIS — K5900 Constipation, unspecified: Secondary | ICD-10-CM | POA: Diagnosis not present

## 2016-07-13 DIAGNOSIS — G894 Chronic pain syndrome: Secondary | ICD-10-CM | POA: Diagnosis not present

## 2016-07-15 DIAGNOSIS — G35 Multiple sclerosis: Secondary | ICD-10-CM | POA: Diagnosis not present

## 2016-07-15 DIAGNOSIS — Z6828 Body mass index (BMI) 28.0-28.9, adult: Secondary | ICD-10-CM | POA: Diagnosis not present

## 2016-07-15 DIAGNOSIS — Z7409 Other reduced mobility: Secondary | ICD-10-CM | POA: Diagnosis not present

## 2016-07-15 DIAGNOSIS — I69959 Hemiplegia and hemiparesis following unspecified cerebrovascular disease affecting unspecified side: Secondary | ICD-10-CM | POA: Diagnosis not present

## 2016-07-15 DIAGNOSIS — E1140 Type 2 diabetes mellitus with diabetic neuropathy, unspecified: Secondary | ICD-10-CM | POA: Diagnosis not present

## 2016-08-03 DIAGNOSIS — Z961 Presence of intraocular lens: Secondary | ICD-10-CM | POA: Diagnosis not present

## 2016-08-03 DIAGNOSIS — H26493 Other secondary cataract, bilateral: Secondary | ICD-10-CM | POA: Diagnosis not present

## 2016-08-03 DIAGNOSIS — H5202 Hypermetropia, left eye: Secondary | ICD-10-CM | POA: Diagnosis not present

## 2016-08-03 DIAGNOSIS — E119 Type 2 diabetes mellitus without complications: Secondary | ICD-10-CM | POA: Diagnosis not present

## 2016-08-11 DIAGNOSIS — K5900 Constipation, unspecified: Secondary | ICD-10-CM | POA: Diagnosis not present

## 2016-08-11 DIAGNOSIS — G894 Chronic pain syndrome: Secondary | ICD-10-CM | POA: Diagnosis not present

## 2016-08-11 DIAGNOSIS — E1042 Type 1 diabetes mellitus with diabetic polyneuropathy: Secondary | ICD-10-CM | POA: Diagnosis not present

## 2016-08-11 DIAGNOSIS — Z79891 Long term (current) use of opiate analgesic: Secondary | ICD-10-CM | POA: Diagnosis not present

## 2016-08-25 DIAGNOSIS — Z6829 Body mass index (BMI) 29.0-29.9, adult: Secondary | ICD-10-CM | POA: Diagnosis not present

## 2016-08-25 DIAGNOSIS — F329 Major depressive disorder, single episode, unspecified: Secondary | ICD-10-CM | POA: Diagnosis not present

## 2016-08-25 DIAGNOSIS — I129 Hypertensive chronic kidney disease with stage 1 through stage 4 chronic kidney disease, or unspecified chronic kidney disease: Secondary | ICD-10-CM | POA: Diagnosis not present

## 2016-08-25 DIAGNOSIS — I10 Essential (primary) hypertension: Secondary | ICD-10-CM | POA: Diagnosis not present

## 2016-08-25 DIAGNOSIS — N401 Enlarged prostate with lower urinary tract symptoms: Secondary | ICD-10-CM | POA: Diagnosis not present

## 2016-08-25 DIAGNOSIS — R945 Abnormal results of liver function studies: Secondary | ICD-10-CM | POA: Diagnosis not present

## 2016-08-25 DIAGNOSIS — N182 Chronic kidney disease, stage 2 (mild): Secondary | ICD-10-CM | POA: Diagnosis not present

## 2016-08-25 DIAGNOSIS — Z125 Encounter for screening for malignant neoplasm of prostate: Secondary | ICD-10-CM | POA: Diagnosis not present

## 2016-08-25 DIAGNOSIS — G35 Multiple sclerosis: Secondary | ICD-10-CM | POA: Diagnosis not present

## 2016-08-25 DIAGNOSIS — E538 Deficiency of other specified B group vitamins: Secondary | ICD-10-CM | POA: Diagnosis not present

## 2016-08-25 DIAGNOSIS — E784 Other hyperlipidemia: Secondary | ICD-10-CM | POA: Diagnosis not present

## 2016-08-25 DIAGNOSIS — Z23 Encounter for immunization: Secondary | ICD-10-CM | POA: Diagnosis not present

## 2016-08-25 DIAGNOSIS — Z0000 Encounter for general adult medical examination without abnormal findings: Secondary | ICD-10-CM | POA: Diagnosis not present

## 2016-08-25 DIAGNOSIS — E1059 Type 1 diabetes mellitus with other circulatory complications: Secondary | ICD-10-CM | POA: Diagnosis not present

## 2016-08-25 DIAGNOSIS — Z1329 Encounter for screening for other suspected endocrine disorder: Secondary | ICD-10-CM | POA: Diagnosis not present

## 2016-09-13 DIAGNOSIS — E1042 Type 1 diabetes mellitus with diabetic polyneuropathy: Secondary | ICD-10-CM | POA: Diagnosis not present

## 2016-09-13 DIAGNOSIS — K5900 Constipation, unspecified: Secondary | ICD-10-CM | POA: Diagnosis not present

## 2016-09-13 DIAGNOSIS — G894 Chronic pain syndrome: Secondary | ICD-10-CM | POA: Diagnosis not present

## 2016-09-13 DIAGNOSIS — Z79891 Long term (current) use of opiate analgesic: Secondary | ICD-10-CM | POA: Diagnosis not present

## 2016-10-11 DIAGNOSIS — Z79891 Long term (current) use of opiate analgesic: Secondary | ICD-10-CM | POA: Diagnosis not present

## 2016-10-11 DIAGNOSIS — G894 Chronic pain syndrome: Secondary | ICD-10-CM | POA: Diagnosis not present

## 2016-10-11 DIAGNOSIS — R251 Tremor, unspecified: Secondary | ICD-10-CM | POA: Diagnosis not present

## 2016-10-11 DIAGNOSIS — E1042 Type 1 diabetes mellitus with diabetic polyneuropathy: Secondary | ICD-10-CM | POA: Diagnosis not present

## 2016-11-09 DIAGNOSIS — E1042 Type 1 diabetes mellitus with diabetic polyneuropathy: Secondary | ICD-10-CM | POA: Diagnosis not present

## 2016-11-09 DIAGNOSIS — Z79891 Long term (current) use of opiate analgesic: Secondary | ICD-10-CM | POA: Diagnosis not present

## 2016-11-09 DIAGNOSIS — R251 Tremor, unspecified: Secondary | ICD-10-CM | POA: Diagnosis not present

## 2016-11-09 DIAGNOSIS — G894 Chronic pain syndrome: Secondary | ICD-10-CM | POA: Diagnosis not present

## 2016-11-09 IMAGING — US US ABDOMEN LIMITED
1 series · 14 of 25 positions shown · non-contrast
Comparison: None in PACs

CLINICAL DATA: Elevated liver function studies, no associated
symptoms; history of diabetes.

EXAM:
US ABDOMEN LIMITED - RIGHT UPPER QUADRANT

[Series 1: us abdomen limited · 0.26mm/px · 14 of 37 slices shown]
[im 1/37]
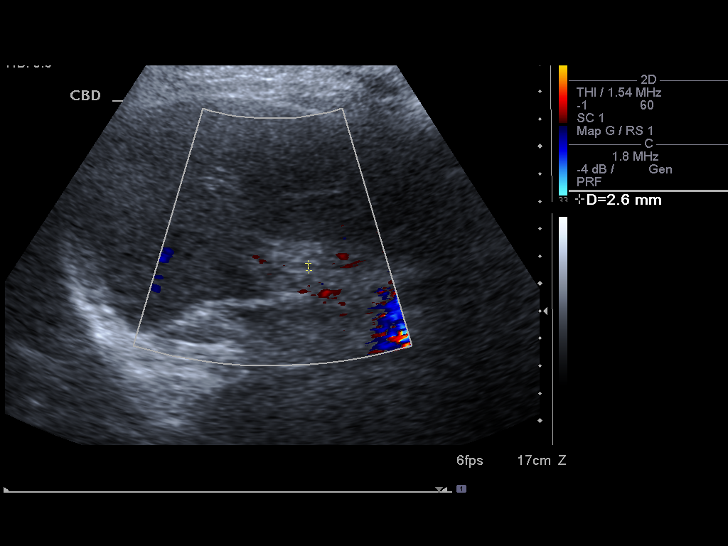
[im 4/37]
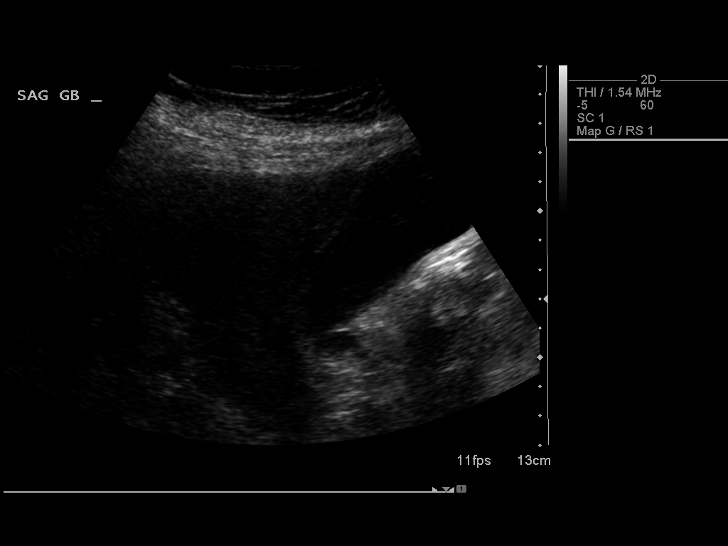
[im 7/37]
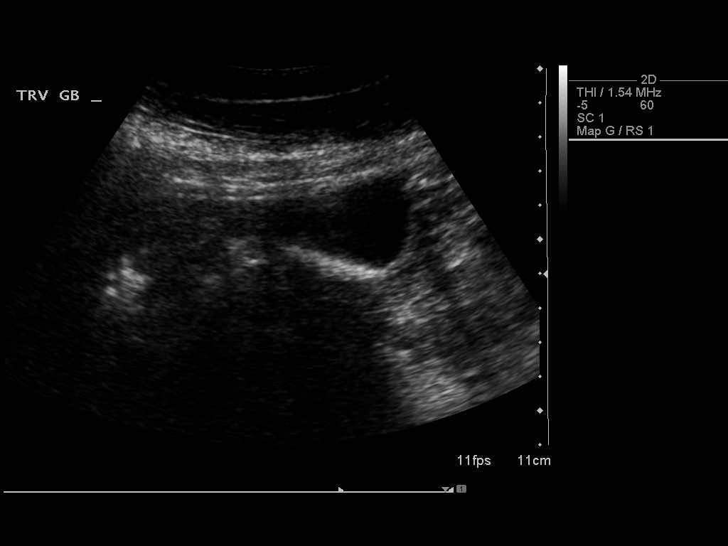
[im 10/37]
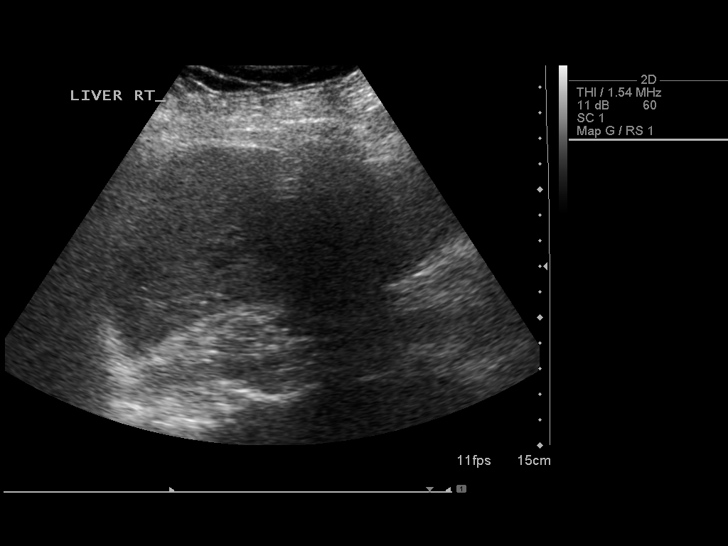
[im 13/37]
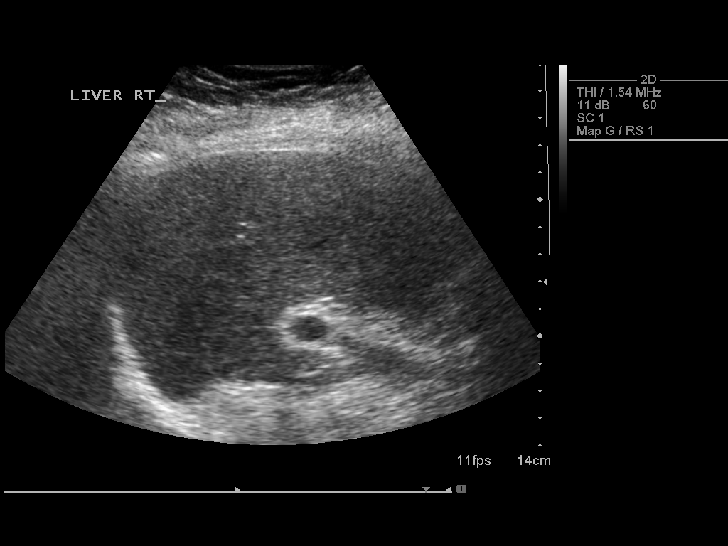
[im 14/37]
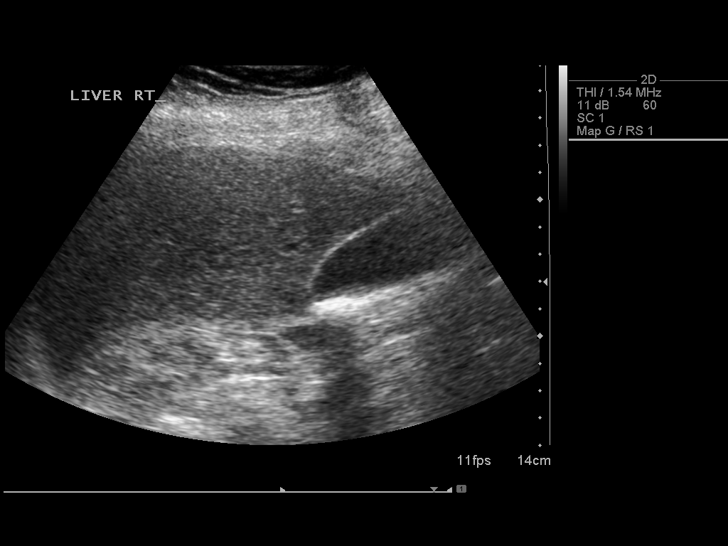
[im 17/37]
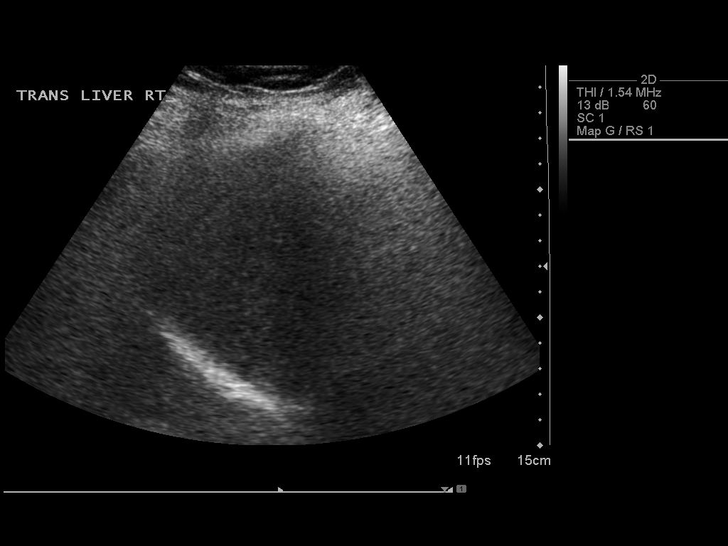
[im 20/37]
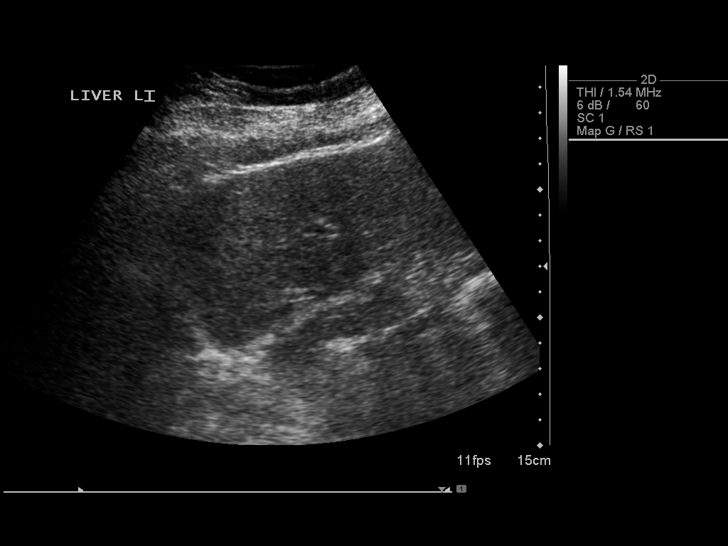
[im 23/37]
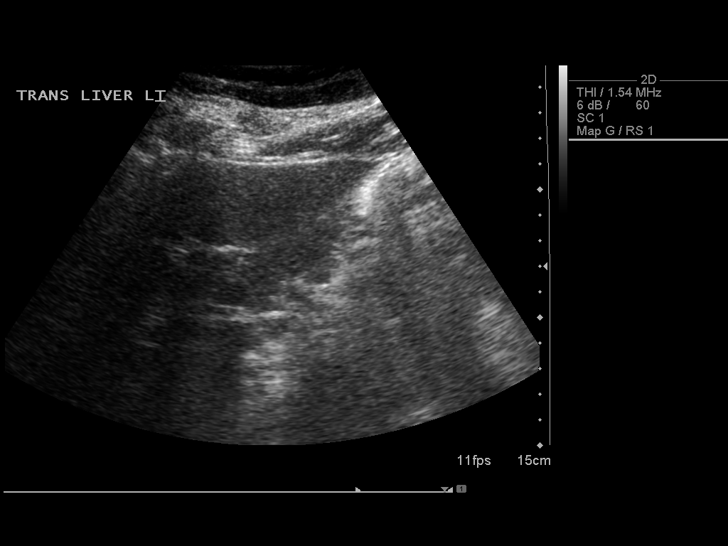
[im 25/37]
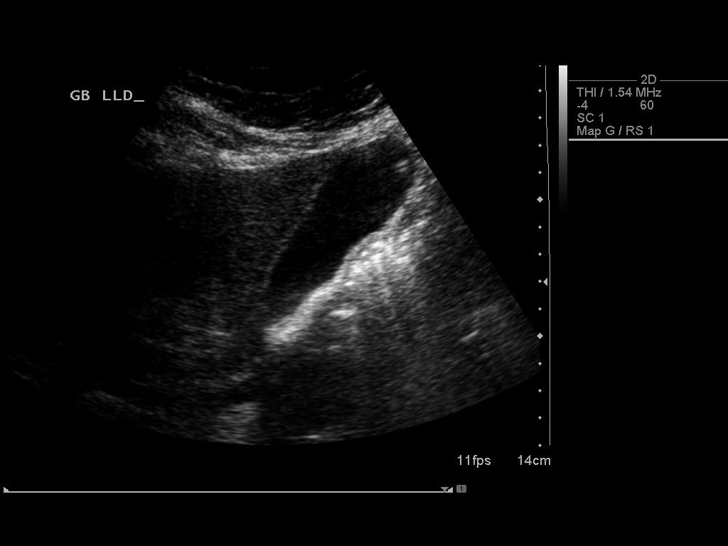
[im 28/37]
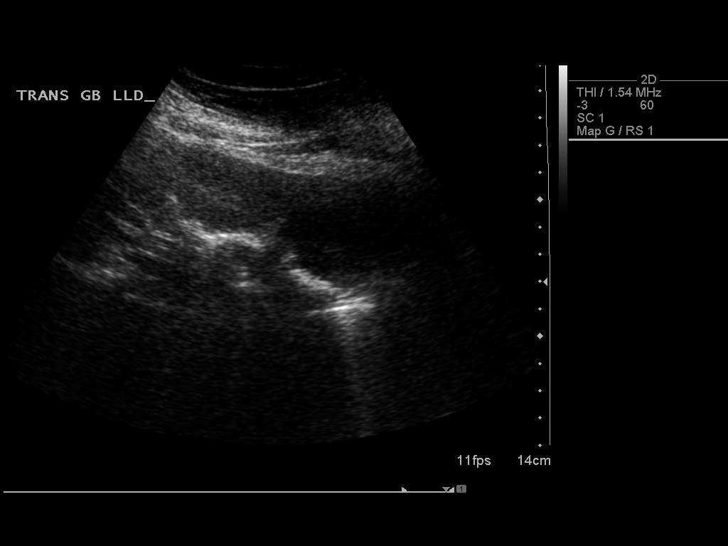
[im 31/37]
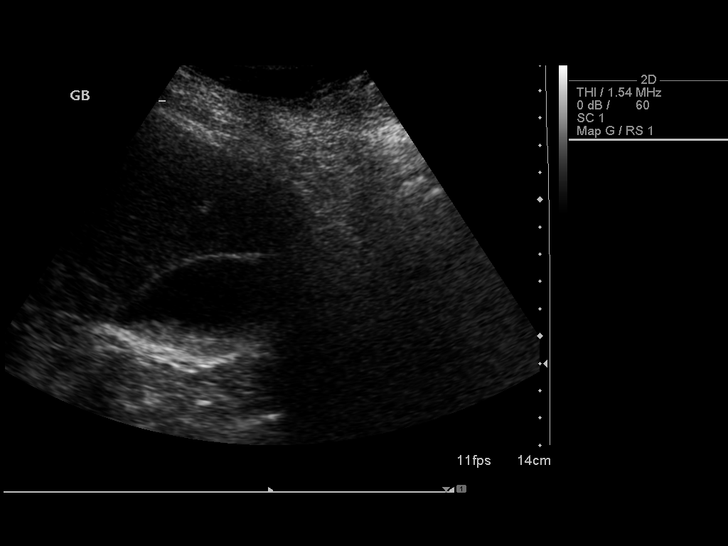
[im 34/37]
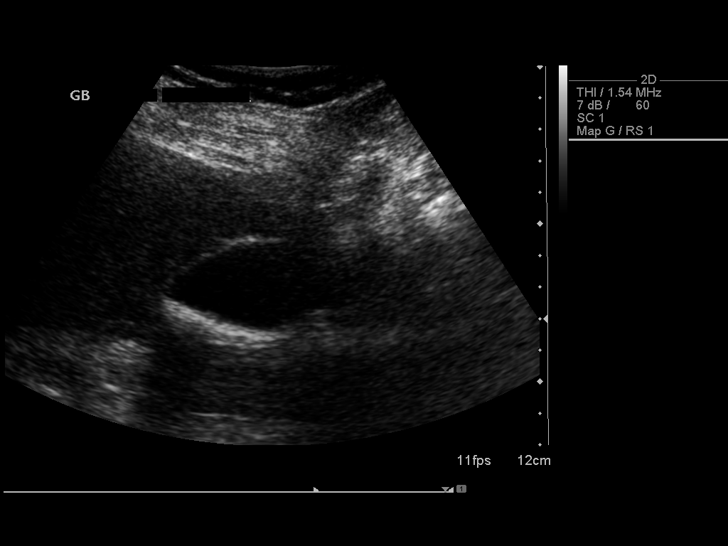
[im 37/37]
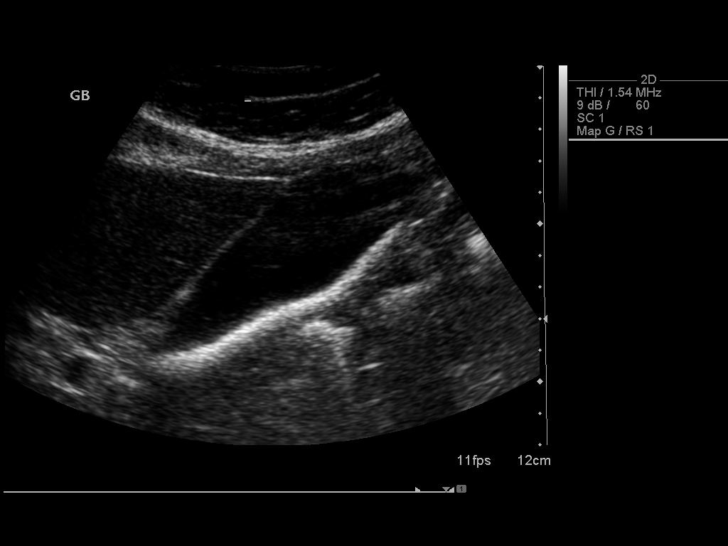

[14 of 25 positions shown; findings below may reference images not displayed]

FINDINGS: Gallbladder:

The gallbladder is adequately distended. There are tiny echogenic
mobile shadowing stones versus sludge. There is no gallbladder wall
thickening, pericholecystic fluid, or positive sonographic Murphy's
sign.

Common bile duct:

Diameter: 2.6 mm

Liver:

The hepatic echotexture appears normal. No focal mass or ductal
dilation is observed. Evaluation of the organ is limited due to the
patient's clinical condition.
IMPRESSION: Tiny gallstones versus sludge. No sonographic evidence of acute
cholecystitis. Normal appearance of the common bile duct and
visualized portions of the liver.

## 2016-12-13 DIAGNOSIS — E1042 Type 1 diabetes mellitus with diabetic polyneuropathy: Secondary | ICD-10-CM | POA: Diagnosis not present

## 2016-12-13 DIAGNOSIS — G894 Chronic pain syndrome: Secondary | ICD-10-CM | POA: Diagnosis not present

## 2016-12-13 DIAGNOSIS — R251 Tremor, unspecified: Secondary | ICD-10-CM | POA: Diagnosis not present

## 2016-12-13 DIAGNOSIS — Z79891 Long term (current) use of opiate analgesic: Secondary | ICD-10-CM | POA: Diagnosis not present

## 2016-12-26 DIAGNOSIS — G35 Multiple sclerosis: Secondary | ICD-10-CM | POA: Diagnosis not present

## 2016-12-26 DIAGNOSIS — G6289 Other specified polyneuropathies: Secondary | ICD-10-CM | POA: Diagnosis not present

## 2016-12-26 DIAGNOSIS — Z6828 Body mass index (BMI) 28.0-28.9, adult: Secondary | ICD-10-CM | POA: Diagnosis not present

## 2016-12-26 DIAGNOSIS — E7849 Other hyperlipidemia: Secondary | ICD-10-CM | POA: Diagnosis not present

## 2016-12-26 DIAGNOSIS — I10 Essential (primary) hypertension: Secondary | ICD-10-CM | POA: Diagnosis not present

## 2016-12-26 DIAGNOSIS — Z7409 Other reduced mobility: Secondary | ICD-10-CM | POA: Diagnosis not present

## 2016-12-26 DIAGNOSIS — E538 Deficiency of other specified B group vitamins: Secondary | ICD-10-CM | POA: Diagnosis not present

## 2016-12-26 DIAGNOSIS — E1140 Type 2 diabetes mellitus with diabetic neuropathy, unspecified: Secondary | ICD-10-CM | POA: Diagnosis not present

## 2016-12-26 DIAGNOSIS — E1059 Type 1 diabetes mellitus with other circulatory complications: Secondary | ICD-10-CM | POA: Diagnosis not present

## 2016-12-26 DIAGNOSIS — E1029 Type 1 diabetes mellitus with other diabetic kidney complication: Secondary | ICD-10-CM | POA: Diagnosis not present

## 2016-12-26 DIAGNOSIS — Z23 Encounter for immunization: Secondary | ICD-10-CM | POA: Diagnosis not present

## 2016-12-26 DIAGNOSIS — G894 Chronic pain syndrome: Secondary | ICD-10-CM | POA: Diagnosis not present

## 2017-01-09 DIAGNOSIS — Z79891 Long term (current) use of opiate analgesic: Secondary | ICD-10-CM | POA: Diagnosis not present

## 2017-01-09 DIAGNOSIS — G894 Chronic pain syndrome: Secondary | ICD-10-CM | POA: Diagnosis not present

## 2017-01-09 DIAGNOSIS — E1042 Type 1 diabetes mellitus with diabetic polyneuropathy: Secondary | ICD-10-CM | POA: Diagnosis not present

## 2017-01-09 DIAGNOSIS — R251 Tremor, unspecified: Secondary | ICD-10-CM | POA: Diagnosis not present

## 2017-02-09 DIAGNOSIS — Z79891 Long term (current) use of opiate analgesic: Secondary | ICD-10-CM | POA: Diagnosis not present

## 2017-02-09 DIAGNOSIS — R251 Tremor, unspecified: Secondary | ICD-10-CM | POA: Diagnosis not present

## 2017-02-09 DIAGNOSIS — E1042 Type 1 diabetes mellitus with diabetic polyneuropathy: Secondary | ICD-10-CM | POA: Diagnosis not present

## 2017-02-09 DIAGNOSIS — G894 Chronic pain syndrome: Secondary | ICD-10-CM | POA: Diagnosis not present

## 2017-03-13 DIAGNOSIS — R251 Tremor, unspecified: Secondary | ICD-10-CM | POA: Diagnosis not present

## 2017-03-13 DIAGNOSIS — E1042 Type 1 diabetes mellitus with diabetic polyneuropathy: Secondary | ICD-10-CM | POA: Diagnosis not present

## 2017-03-13 DIAGNOSIS — Z79891 Long term (current) use of opiate analgesic: Secondary | ICD-10-CM | POA: Diagnosis not present

## 2017-03-13 DIAGNOSIS — G894 Chronic pain syndrome: Secondary | ICD-10-CM | POA: Diagnosis not present

## 2017-03-31 DIAGNOSIS — I2510 Atherosclerotic heart disease of native coronary artery without angina pectoris: Secondary | ICD-10-CM | POA: Insufficient documentation

## 2017-03-31 DIAGNOSIS — E872 Acidosis, unspecified: Secondary | ICD-10-CM | POA: Insufficient documentation

## 2017-03-31 DIAGNOSIS — F329 Major depressive disorder, single episode, unspecified: Secondary | ICD-10-CM | POA: Diagnosis not present

## 2017-03-31 DIAGNOSIS — K8689 Other specified diseases of pancreas: Secondary | ICD-10-CM | POA: Diagnosis not present

## 2017-03-31 DIAGNOSIS — F32A Depression, unspecified: Secondary | ICD-10-CM | POA: Insufficient documentation

## 2017-03-31 DIAGNOSIS — Z7982 Long term (current) use of aspirin: Secondary | ICD-10-CM | POA: Diagnosis not present

## 2017-03-31 DIAGNOSIS — F418 Other specified anxiety disorders: Secondary | ICD-10-CM | POA: Insufficient documentation

## 2017-03-31 DIAGNOSIS — N390 Urinary tract infection, site not specified: Secondary | ICD-10-CM | POA: Diagnosis not present

## 2017-03-31 DIAGNOSIS — Z8673 Personal history of transient ischemic attack (TIA), and cerebral infarction without residual deficits: Secondary | ICD-10-CM | POA: Insufficient documentation

## 2017-03-31 DIAGNOSIS — K5641 Fecal impaction: Secondary | ICD-10-CM | POA: Diagnosis not present

## 2017-03-31 DIAGNOSIS — E1322 Other specified diabetes mellitus with diabetic chronic kidney disease: Secondary | ICD-10-CM | POA: Diagnosis not present

## 2017-03-31 DIAGNOSIS — E1165 Type 2 diabetes mellitus with hyperglycemia: Secondary | ICD-10-CM | POA: Diagnosis not present

## 2017-03-31 DIAGNOSIS — G35 Multiple sclerosis: Secondary | ICD-10-CM | POA: Diagnosis not present

## 2017-03-31 DIAGNOSIS — K5900 Constipation, unspecified: Secondary | ICD-10-CM | POA: Diagnosis not present

## 2017-03-31 DIAGNOSIS — I129 Hypertensive chronic kidney disease with stage 1 through stage 4 chronic kidney disease, or unspecified chronic kidney disease: Secondary | ICD-10-CM | POA: Diagnosis not present

## 2017-03-31 DIAGNOSIS — Z79899 Other long term (current) drug therapy: Secondary | ICD-10-CM | POA: Diagnosis not present

## 2017-03-31 DIAGNOSIS — E1101 Type 2 diabetes mellitus with hyperosmolarity with coma: Secondary | ICD-10-CM | POA: Diagnosis not present

## 2017-03-31 DIAGNOSIS — E1100 Type 2 diabetes mellitus with hyperosmolarity without nonketotic hyperglycemic-hyperosmolar coma (NKHHC): Secondary | ICD-10-CM | POA: Insufficient documentation

## 2017-03-31 DIAGNOSIS — N281 Cyst of kidney, acquired: Secondary | ICD-10-CM | POA: Diagnosis not present

## 2017-03-31 DIAGNOSIS — R109 Unspecified abdominal pain: Secondary | ICD-10-CM | POA: Diagnosis not present

## 2017-03-31 DIAGNOSIS — Z794 Long term (current) use of insulin: Secondary | ICD-10-CM | POA: Diagnosis not present

## 2017-03-31 DIAGNOSIS — E1143 Type 2 diabetes mellitus with diabetic autonomic (poly)neuropathy: Secondary | ICD-10-CM | POA: Insufficient documentation

## 2017-03-31 DIAGNOSIS — N182 Chronic kidney disease, stage 2 (mild): Secondary | ICD-10-CM | POA: Diagnosis not present

## 2017-03-31 DIAGNOSIS — Z8719 Personal history of other diseases of the digestive system: Secondary | ICD-10-CM | POA: Diagnosis not present

## 2017-03-31 DIAGNOSIS — K8020 Calculus of gallbladder without cholecystitis without obstruction: Secondary | ICD-10-CM | POA: Diagnosis not present

## 2017-03-31 DIAGNOSIS — E875 Hyperkalemia: Secondary | ICD-10-CM | POA: Insufficient documentation

## 2017-03-31 DIAGNOSIS — G894 Chronic pain syndrome: Secondary | ICD-10-CM | POA: Diagnosis not present

## 2017-03-31 DIAGNOSIS — N3001 Acute cystitis with hematuria: Secondary | ICD-10-CM | POA: Insufficient documentation

## 2017-03-31 HISTORY — DX: Fecal impaction: K56.41

## 2017-04-01 DIAGNOSIS — I2510 Atherosclerotic heart disease of native coronary artery without angina pectoris: Secondary | ICD-10-CM | POA: Diagnosis not present

## 2017-04-01 DIAGNOSIS — G35 Multiple sclerosis: Secondary | ICD-10-CM | POA: Diagnosis not present

## 2017-04-01 DIAGNOSIS — E875 Hyperkalemia: Secondary | ICD-10-CM | POA: Diagnosis not present

## 2017-04-01 DIAGNOSIS — N182 Chronic kidney disease, stage 2 (mild): Secondary | ICD-10-CM | POA: Diagnosis not present

## 2017-04-01 DIAGNOSIS — Z8673 Personal history of transient ischemic attack (TIA), and cerebral infarction without residual deficits: Secondary | ICD-10-CM | POA: Diagnosis not present

## 2017-04-01 DIAGNOSIS — E1101 Type 2 diabetes mellitus with hyperosmolarity with coma: Secondary | ICD-10-CM | POA: Diagnosis not present

## 2017-04-01 DIAGNOSIS — N3001 Acute cystitis with hematuria: Secondary | ICD-10-CM | POA: Diagnosis not present

## 2017-04-01 DIAGNOSIS — F329 Major depressive disorder, single episode, unspecified: Secondary | ICD-10-CM | POA: Diagnosis not present

## 2017-04-01 DIAGNOSIS — G894 Chronic pain syndrome: Secondary | ICD-10-CM | POA: Diagnosis not present

## 2017-04-01 DIAGNOSIS — K5641 Fecal impaction: Secondary | ICD-10-CM | POA: Diagnosis not present

## 2017-04-02 DIAGNOSIS — Z8673 Personal history of transient ischemic attack (TIA), and cerebral infarction without residual deficits: Secondary | ICD-10-CM | POA: Diagnosis not present

## 2017-04-02 DIAGNOSIS — E875 Hyperkalemia: Secondary | ICD-10-CM | POA: Diagnosis not present

## 2017-04-02 DIAGNOSIS — E1101 Type 2 diabetes mellitus with hyperosmolarity with coma: Secondary | ICD-10-CM | POA: Diagnosis not present

## 2017-04-02 DIAGNOSIS — N3001 Acute cystitis with hematuria: Secondary | ICD-10-CM | POA: Diagnosis not present

## 2017-04-02 DIAGNOSIS — K5641 Fecal impaction: Secondary | ICD-10-CM | POA: Diagnosis not present

## 2017-04-02 DIAGNOSIS — I2510 Atherosclerotic heart disease of native coronary artery without angina pectoris: Secondary | ICD-10-CM | POA: Diagnosis not present

## 2017-04-02 DIAGNOSIS — F329 Major depressive disorder, single episode, unspecified: Secondary | ICD-10-CM | POA: Diagnosis not present

## 2017-04-02 DIAGNOSIS — G894 Chronic pain syndrome: Secondary | ICD-10-CM | POA: Diagnosis not present

## 2017-04-02 DIAGNOSIS — N182 Chronic kidney disease, stage 2 (mild): Secondary | ICD-10-CM | POA: Diagnosis not present

## 2017-04-02 DIAGNOSIS — G35 Multiple sclerosis: Secondary | ICD-10-CM | POA: Diagnosis not present

## 2017-04-11 DIAGNOSIS — E1042 Type 1 diabetes mellitus with diabetic polyneuropathy: Secondary | ICD-10-CM | POA: Diagnosis not present

## 2017-04-11 DIAGNOSIS — Z79891 Long term (current) use of opiate analgesic: Secondary | ICD-10-CM | POA: Diagnosis not present

## 2017-04-11 DIAGNOSIS — G894 Chronic pain syndrome: Secondary | ICD-10-CM | POA: Diagnosis not present

## 2017-04-11 DIAGNOSIS — R251 Tremor, unspecified: Secondary | ICD-10-CM | POA: Diagnosis not present

## 2017-04-12 ENCOUNTER — Ambulatory Visit: Payer: Medicare Other | Admitting: Podiatry

## 2017-04-17 DIAGNOSIS — Z961 Presence of intraocular lens: Secondary | ICD-10-CM | POA: Diagnosis not present

## 2017-04-17 DIAGNOSIS — H26491 Other secondary cataract, right eye: Secondary | ICD-10-CM | POA: Diagnosis not present

## 2017-04-19 DIAGNOSIS — R945 Abnormal results of liver function studies: Secondary | ICD-10-CM | POA: Diagnosis not present

## 2017-04-19 DIAGNOSIS — N401 Enlarged prostate with lower urinary tract symptoms: Secondary | ICD-10-CM | POA: Diagnosis not present

## 2017-04-19 DIAGNOSIS — G35 Multiple sclerosis: Secondary | ICD-10-CM | POA: Diagnosis not present

## 2017-04-19 DIAGNOSIS — I10 Essential (primary) hypertension: Secondary | ICD-10-CM | POA: Diagnosis not present

## 2017-04-19 DIAGNOSIS — Z6828 Body mass index (BMI) 28.0-28.9, adult: Secondary | ICD-10-CM | POA: Diagnosis not present

## 2017-04-19 DIAGNOSIS — E1029 Type 1 diabetes mellitus with other diabetic kidney complication: Secondary | ICD-10-CM | POA: Diagnosis not present

## 2017-04-19 DIAGNOSIS — F3289 Other specified depressive episodes: Secondary | ICD-10-CM | POA: Diagnosis not present

## 2017-04-19 DIAGNOSIS — N182 Chronic kidney disease, stage 2 (mild): Secondary | ICD-10-CM | POA: Diagnosis not present

## 2017-04-19 DIAGNOSIS — E1059 Type 1 diabetes mellitus with other circulatory complications: Secondary | ICD-10-CM | POA: Diagnosis not present

## 2017-04-19 DIAGNOSIS — I2589 Other forms of chronic ischemic heart disease: Secondary | ICD-10-CM | POA: Diagnosis not present

## 2017-04-23 IMAGING — DX DG BONE SURVEY MET
9 of 10 series · 9 of 10 positions shown · non-contrast
Comparison: Chest radiograph 04/20/2013

CLINICAL DATA: Staging of myeloma, chronic back and BILATERAL hip
pain, coronary artery disease, hypertension, multiple sclerosis,
type I diabetes mellitus, prior stroke, former smoker

EXAM:
METASTATIC BONE SURVEY

[skull lat]
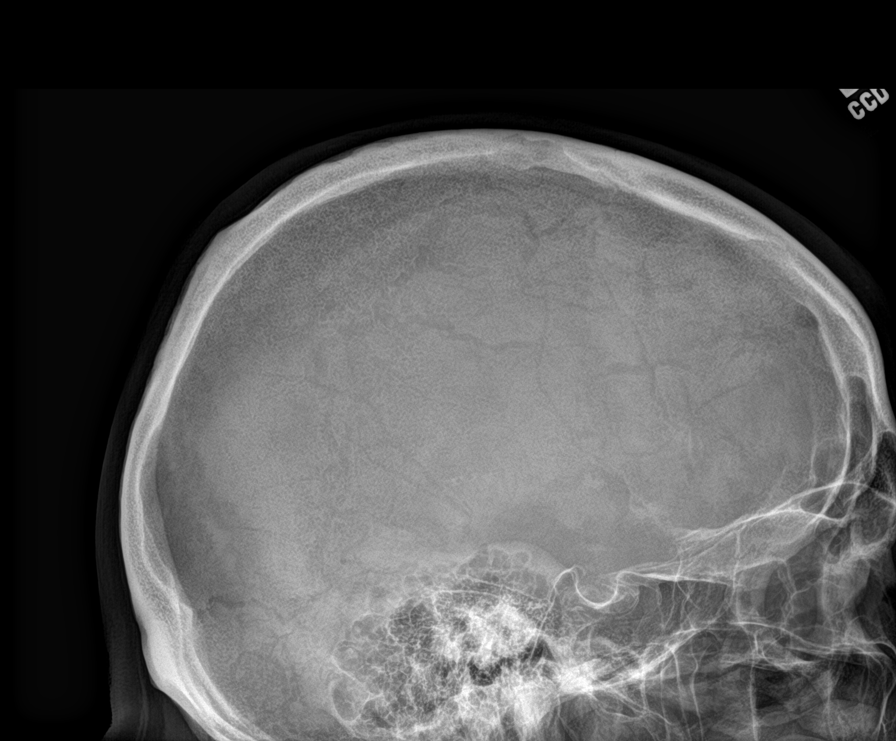

[shoulder ap (1 of 2)]
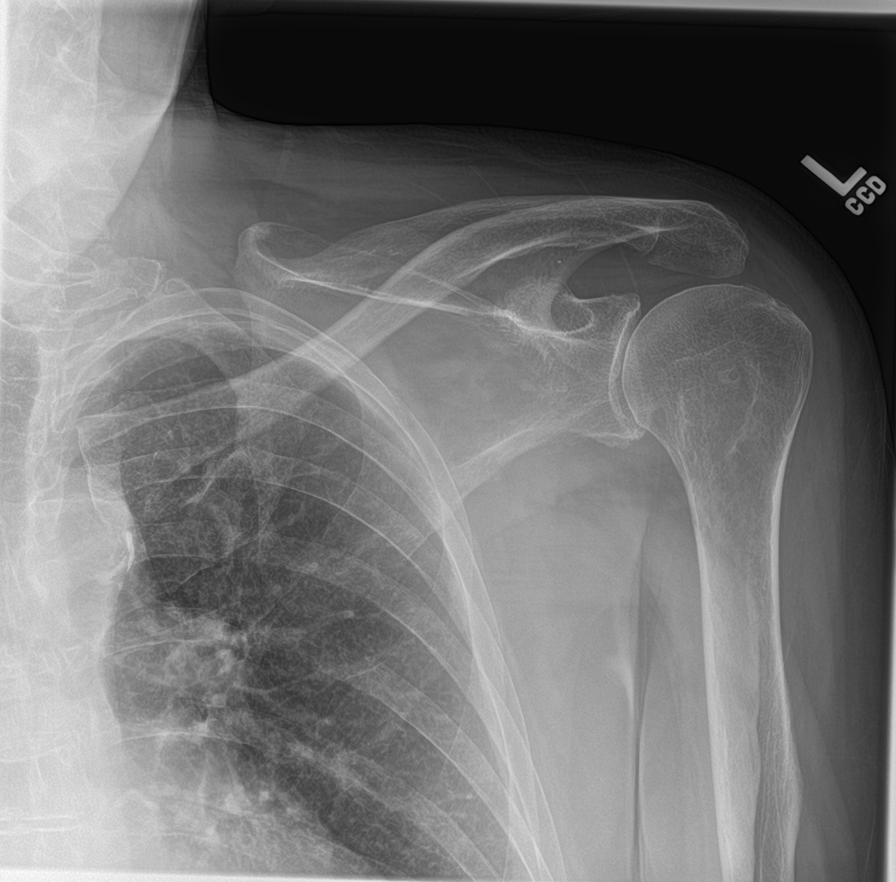

[shoulder ap (2 of 2)]
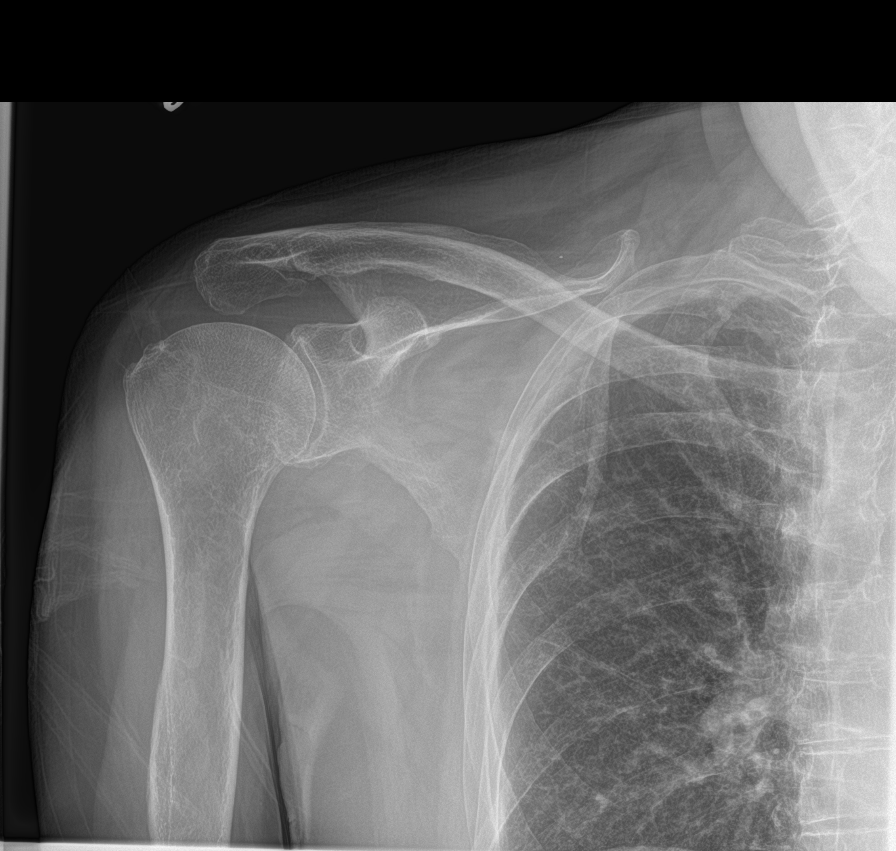

[humerus ap (1 of 2)]
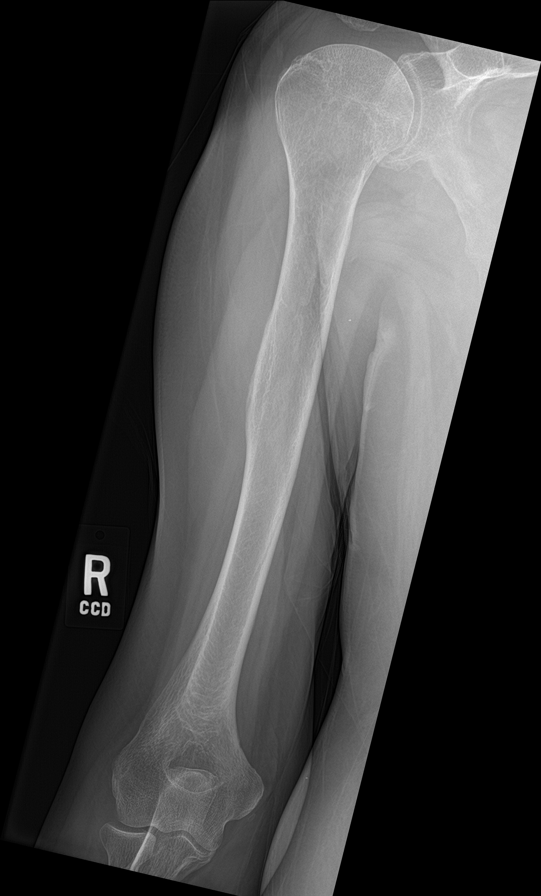

[humerus ap (2 of 2)]
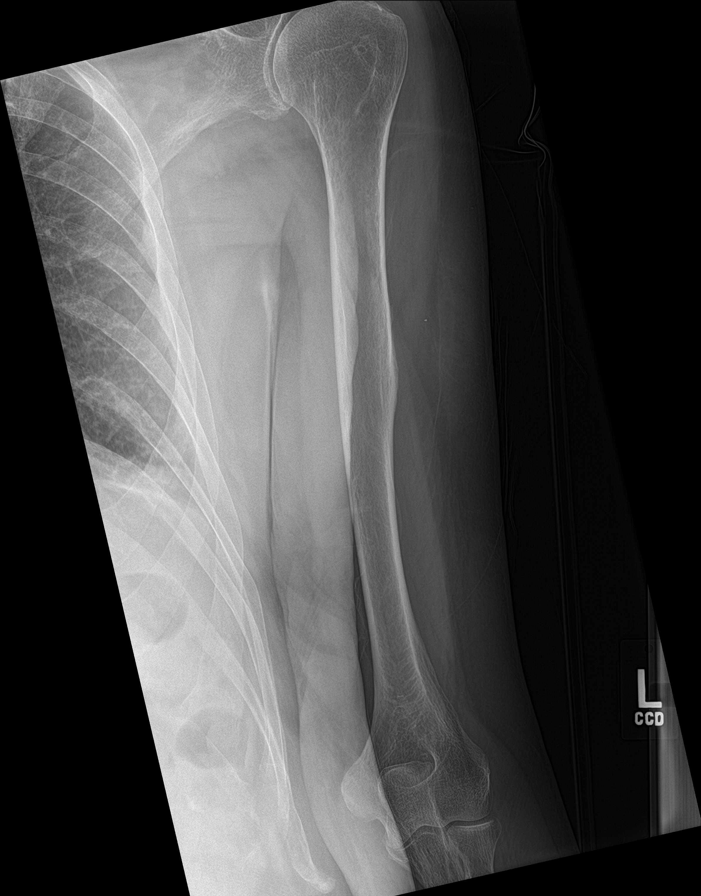

[forearm ap (1 of 2)]
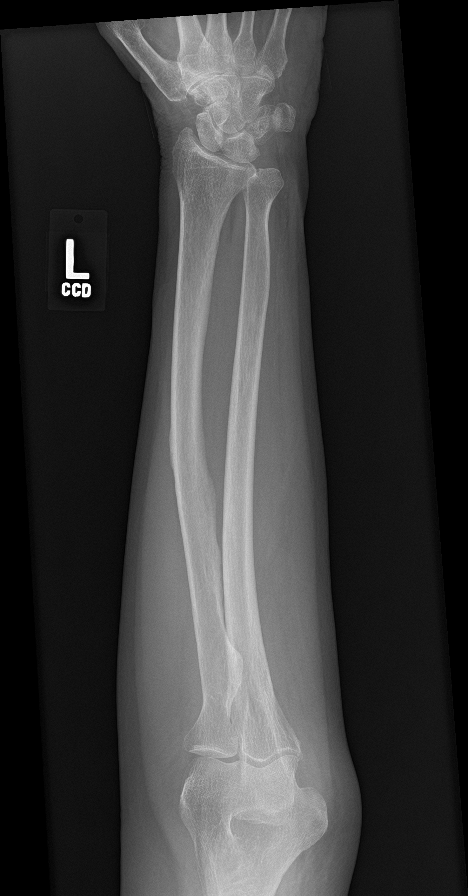

[forearm ap (2 of 2)]
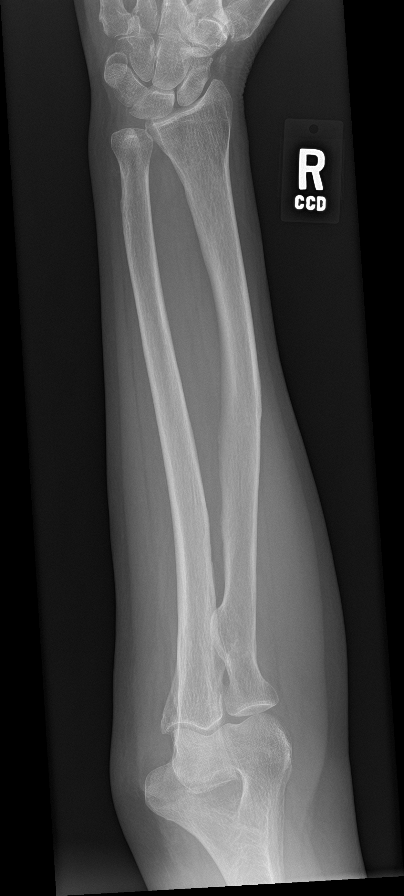

[c-spine ap]
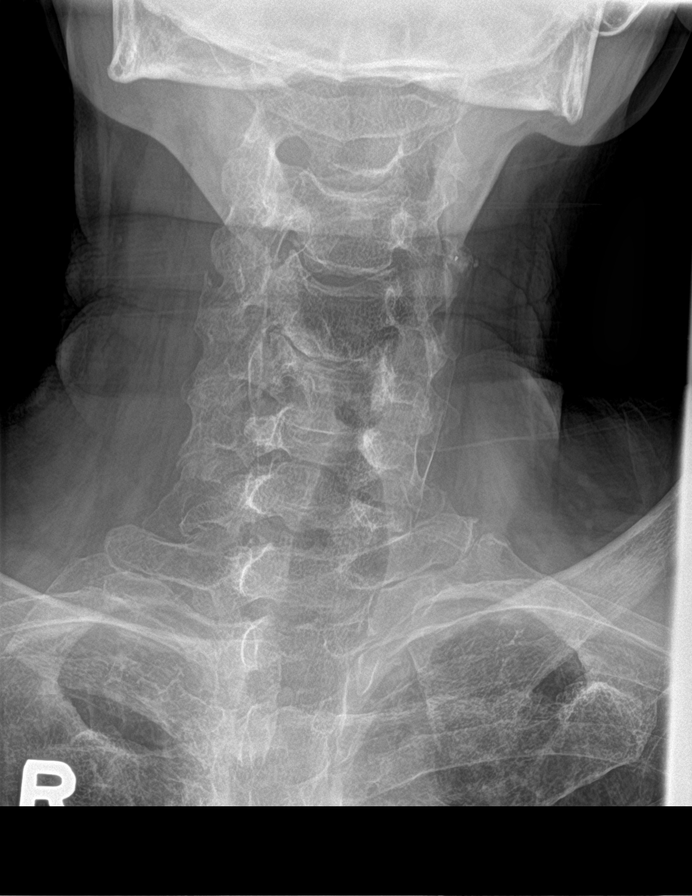

[c-spine lat]
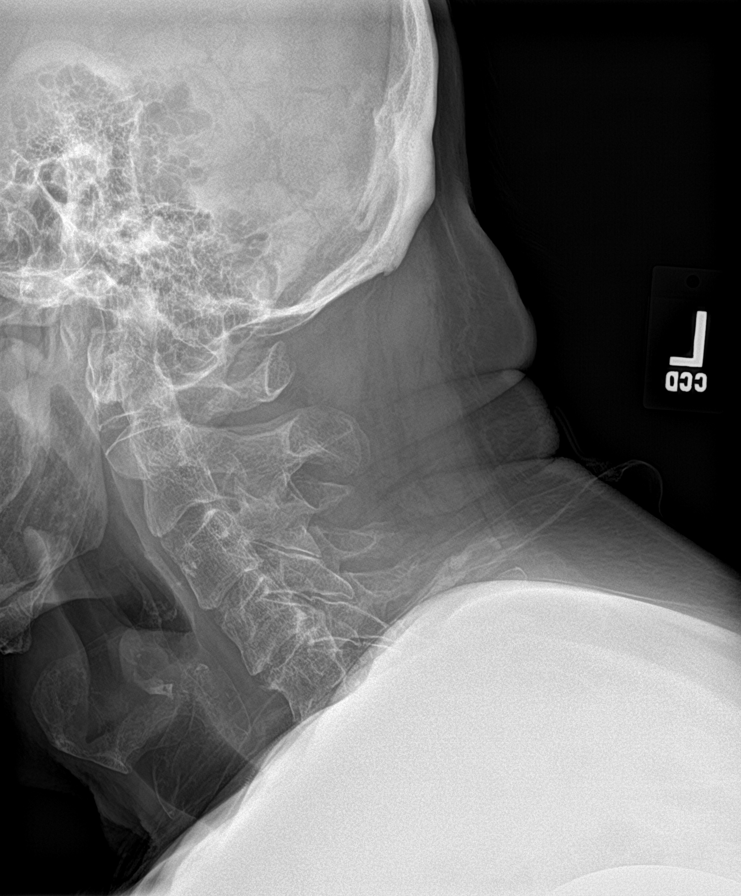

[9 of 10 positions shown; findings below may reference images not displayed]

FINDINGS: Normal heart size, mediastinal contours, and pulmonary vascularity.

Atherosclerotic calcification aorta.

Lungs emphysematous with mild accentuation of basilar interstitial
markings.

No definite acute infiltrate, pleural effusion or pneumothorax.

Significant diffuse osseous demineralization.

Joint space narrowing BILATERAL hips with spur formation at RIGHT
hip.

No definite lytic or destructive bone lesions identified.
IMPRESSION: Marked osseous demineralization.

No lytic lesions identified within visualized osseous structures to
suggest multiple myeloma.

Aortic atherosclerosis.

## 2017-05-09 DIAGNOSIS — H26491 Other secondary cataract, right eye: Secondary | ICD-10-CM | POA: Diagnosis not present

## 2017-05-10 DIAGNOSIS — Z79891 Long term (current) use of opiate analgesic: Secondary | ICD-10-CM | POA: Diagnosis not present

## 2017-05-10 DIAGNOSIS — E1042 Type 1 diabetes mellitus with diabetic polyneuropathy: Secondary | ICD-10-CM | POA: Diagnosis not present

## 2017-05-10 DIAGNOSIS — R251 Tremor, unspecified: Secondary | ICD-10-CM | POA: Diagnosis not present

## 2017-05-10 DIAGNOSIS — G894 Chronic pain syndrome: Secondary | ICD-10-CM | POA: Diagnosis not present

## 2017-06-06 DIAGNOSIS — G894 Chronic pain syndrome: Secondary | ICD-10-CM | POA: Diagnosis not present

## 2017-06-06 DIAGNOSIS — R251 Tremor, unspecified: Secondary | ICD-10-CM | POA: Diagnosis not present

## 2017-06-06 DIAGNOSIS — E1042 Type 1 diabetes mellitus with diabetic polyneuropathy: Secondary | ICD-10-CM | POA: Diagnosis not present

## 2017-06-06 DIAGNOSIS — Z79891 Long term (current) use of opiate analgesic: Secondary | ICD-10-CM | POA: Diagnosis not present

## 2017-06-22 ENCOUNTER — Ambulatory Visit (INDEPENDENT_AMBULATORY_CARE_PROVIDER_SITE_OTHER): Payer: Medicare Other | Admitting: Podiatry

## 2017-06-22 DIAGNOSIS — D689 Coagulation defect, unspecified: Secondary | ICD-10-CM

## 2017-06-22 DIAGNOSIS — B351 Tinea unguium: Secondary | ICD-10-CM

## 2017-07-04 ENCOUNTER — Telehealth: Payer: Self-pay | Admitting: *Deleted

## 2017-07-04 ENCOUNTER — Ambulatory Visit (INDEPENDENT_AMBULATORY_CARE_PROVIDER_SITE_OTHER): Payer: Medicare Other | Admitting: Podiatry

## 2017-07-04 ENCOUNTER — Encounter: Payer: Self-pay | Admitting: Podiatry

## 2017-07-04 DIAGNOSIS — E10621 Type 1 diabetes mellitus with foot ulcer: Secondary | ICD-10-CM | POA: Diagnosis not present

## 2017-07-04 DIAGNOSIS — D689 Coagulation defect, unspecified: Secondary | ICD-10-CM

## 2017-07-04 DIAGNOSIS — L97509 Non-pressure chronic ulcer of other part of unspecified foot with unspecified severity: Secondary | ICD-10-CM

## 2017-07-04 DIAGNOSIS — L97511 Non-pressure chronic ulcer of other part of right foot limited to breakdown of skin: Secondary | ICD-10-CM | POA: Diagnosis not present

## 2017-07-04 DIAGNOSIS — E1159 Type 2 diabetes mellitus with other circulatory complications: Secondary | ICD-10-CM | POA: Diagnosis not present

## 2017-07-04 DIAGNOSIS — E1042 Type 1 diabetes mellitus with diabetic polyneuropathy: Secondary | ICD-10-CM | POA: Diagnosis not present

## 2017-07-04 MED ORDER — NITROGLYCERIN 0.4 MG/HR TD PT24: MEDICATED_PATCH | TRANSDERMAL | 0 refills | Status: DC

## 2017-07-04 NOTE — Telephone Encounter (Signed)
I informed pt's wife, Hoyle Sauer of Dr. Burnell Blanks orders and she states she will go get it tomorrow. I told Hoyle Sauer pt needed to start this medication therapy tonight. Hoyle Sauer states she will pick up tonight.

## 2017-07-04 NOTE — Telephone Encounter (Signed)
Dr. Prudence Davidson states pt needs Nitro patch 0.4mg  applied to white flesh of the toe daily.

## 2017-07-04 NOTE — Progress Notes (Signed)
This patient presents the office today for an evaluation of his third toe right foot.  He presents to the office with his daughter.  She says that he came to the office 2 weeks ago for preventative foot care services.  She says that there was significant bleeding to the third toe, right foot  due to the preventative foot care services provided. There was a compression dressing  applied to the third toe and this patient was sent home.  She says this compression dressing was held in place with coban.  It was difficult getting a proper history .  The patient says he noticed that the there was a black discoloration at the base of the third toe right foot.  He then says this black discoloration caused him to to remove the bandage.   He removed the compression dressing from his toe and noted black discoloration  over the top of the third toe .   He then says he made an appointment for further evaluation and treatment.    He and his daughter then provided local wound care for this third toe right foot.  He presents to the office for evaluation of this third toe. Patient is taking plavix.    General Appearance  Alert, conversant and in no acute stress.  Vascular  Dorsalis pedis and posterior tibial  pulses are not  palpable  bilaterally.  Capillary return is within normal limits  bilaterally. Temperature is within normal limits  bilaterally.  Neurologic  Senn-Weinstein monofilament wire test absent  bilaterally. Muscle power within normal limits bilaterally.  Nails Thick disfigured discolored nails with subungual debris  from hallux to fifth toes bilaterally. No evidence of bacterial infection or drainage bilaterally.  Orthopedic  No limitations of motion of motion feet .  No crepitus or effusions noted.  No bony pathology or digital deformities noted.Pes planus noted  B/L.  Skin  Proximal to the third toe right is a tinge of red discoloration over 3rd MPJ right foot.  The third toe is covered with black skin  necrosis and red granulation tissue noted on dorsum of the third toe.  See picture below.      Diagnosis  Skin ulceration third toe due to compression dressing third toe right foot.  Diabetes with angiopathy and neuropathy.     ROV  Debride necrotic tissue third toe right foot.  Home instructions given for home soaks and bandaging.  Presented and explained the bandage for covering third toe.  Showed and discussed this patient with Dr. Jacqualyn Posey and he recommended a nitro patch to promote healing.  This is to be ordered by  [facility].   Patient to return to the office in 10-14 days for reevaluation.  If this condition worsens or becomes very painful, the patient was told to contact this office or go to the Emergency Department at the hospital.  Gardiner Barefoot DPM

## 2017-07-05 DIAGNOSIS — G894 Chronic pain syndrome: Secondary | ICD-10-CM | POA: Diagnosis not present

## 2017-07-05 DIAGNOSIS — R251 Tremor, unspecified: Secondary | ICD-10-CM | POA: Diagnosis not present

## 2017-07-05 DIAGNOSIS — E1042 Type 1 diabetes mellitus with diabetic polyneuropathy: Secondary | ICD-10-CM | POA: Diagnosis not present

## 2017-07-05 DIAGNOSIS — Z79891 Long term (current) use of opiate analgesic: Secondary | ICD-10-CM | POA: Diagnosis not present

## 2017-07-19 ENCOUNTER — Ambulatory Visit (INDEPENDENT_AMBULATORY_CARE_PROVIDER_SITE_OTHER): Payer: Medicare Other | Admitting: Podiatry

## 2017-07-19 ENCOUNTER — Encounter: Payer: Self-pay | Admitting: Podiatry

## 2017-07-19 DIAGNOSIS — L97511 Non-pressure chronic ulcer of other part of right foot limited to breakdown of skin: Secondary | ICD-10-CM

## 2017-07-19 DIAGNOSIS — L97509 Non-pressure chronic ulcer of other part of unspecified foot with unspecified severity: Secondary | ICD-10-CM

## 2017-07-19 DIAGNOSIS — E10621 Type 1 diabetes mellitus with foot ulcer: Secondary | ICD-10-CM

## 2017-07-20 NOTE — Progress Notes (Signed)
This patient  Presents to the office for continued evaluation for diagnosis of an ulcer of the third toe of the right foot.  Patient had  preventative foot care services provided four weeks ago.He developed a ulceration on the third toe of the right foot.  He was seen by myself approximately 2 weeks ago in the office for this ulceration that was present on both the top and the bottom of his third toe, right foot.  He also had redness and inflammation noted at the base of the third toe of the right foot.  Patient was instructed to provide home soaking instructions and bandaging on this third toe.  He was also called in a nitroglycerin patch, which his daughter has been placing on the bottom of the third toe, right foot and not at the base of the third toe, right foot.  The third toe in the foot has been healing  and the daughter states that she is having difficulty applying the nitroglycerin patch on the bottom of the third toe.  This area on the bottom of the third toe  has remained white with maceration noted.  No evidence of any infection is noted. He returns to the office for continued evaluation and treatment of this healing third toe.  General Appearance  Alert, conversant and in no acute stress.  Vascular  Dorsalis pedis and posterior tibial  pulses are not  palpable  bilaterally.  Capillary return is within normal limits  bilaterally. Temperature is within normal limits  bilaterally.  Neurologic  Senn-Weinstein monofilament wire test absent   bilaterally. Muscle power within normal limits bilaterally.  Nails Thick disfigured discolored nails with subungual debris  from hallux to fifth toes bilaterally. No evidence of bacterial infection or drainage bilaterally.  Orthopedic  No limitations of motion of motion feet .  No crepitus or effusions noted.  No bony pathology or digital deformities noted.  Skin  normotropic skin with no porokeratosis noted bilaterally.  The ulcer on the dorsum of the third  toe reveals marked improvement with a small circular area of ulcer noted over the PIPJ.  No redness, swelling or infection or drainage is noted.  The plantar aspect of the third toe is macerated with drainage noted.     Diabetic ulcer right foot.    ROV.  This diabetic ulcer is healing nicely and closing.  No evidence of infection is noted.  There does appear to be a skin reaction because the daughter was applying the Nitro patch to the bottom of the third toe, causing reactivity.  She should have been placing the Nitro patch on the base of the third toe.  Patient and his daughter were instructed on the proper way of bandaging this toe.  Continue soaks.   He is to return to  the office if the toe   worsens or nonhealing occurs.  This patient has difficulty getting into the office  so therefore recommended he return as needed for toe evaluation.  He should return for preventative foot care services.  Gardiner Barefoot DPM

## 2017-07-31 DIAGNOSIS — G894 Chronic pain syndrome: Secondary | ICD-10-CM | POA: Diagnosis not present

## 2017-07-31 DIAGNOSIS — R251 Tremor, unspecified: Secondary | ICD-10-CM | POA: Diagnosis not present

## 2017-07-31 DIAGNOSIS — E1042 Type 1 diabetes mellitus with diabetic polyneuropathy: Secondary | ICD-10-CM | POA: Diagnosis not present

## 2017-07-31 DIAGNOSIS — Z79891 Long term (current) use of opiate analgesic: Secondary | ICD-10-CM | POA: Diagnosis not present

## 2017-08-29 DIAGNOSIS — R251 Tremor, unspecified: Secondary | ICD-10-CM | POA: Diagnosis not present

## 2017-08-29 DIAGNOSIS — Z79891 Long term (current) use of opiate analgesic: Secondary | ICD-10-CM | POA: Diagnosis not present

## 2017-08-29 DIAGNOSIS — G894 Chronic pain syndrome: Secondary | ICD-10-CM | POA: Diagnosis not present

## 2017-08-29 DIAGNOSIS — E1042 Type 1 diabetes mellitus with diabetic polyneuropathy: Secondary | ICD-10-CM | POA: Diagnosis not present

## 2017-09-27 DIAGNOSIS — Z79891 Long term (current) use of opiate analgesic: Secondary | ICD-10-CM | POA: Diagnosis not present

## 2017-09-27 DIAGNOSIS — G894 Chronic pain syndrome: Secondary | ICD-10-CM | POA: Diagnosis not present

## 2017-09-27 DIAGNOSIS — E1042 Type 1 diabetes mellitus with diabetic polyneuropathy: Secondary | ICD-10-CM | POA: Diagnosis not present

## 2017-09-27 DIAGNOSIS — R251 Tremor, unspecified: Secondary | ICD-10-CM | POA: Diagnosis not present

## 2017-09-28 DIAGNOSIS — N182 Chronic kidney disease, stage 2 (mild): Secondary | ICD-10-CM | POA: Diagnosis not present

## 2017-09-28 DIAGNOSIS — E1140 Type 2 diabetes mellitus with diabetic neuropathy, unspecified: Secondary | ICD-10-CM | POA: Diagnosis not present

## 2017-09-28 DIAGNOSIS — G894 Chronic pain syndrome: Secondary | ICD-10-CM | POA: Diagnosis not present

## 2017-09-28 DIAGNOSIS — E1059 Type 1 diabetes mellitus with other circulatory complications: Secondary | ICD-10-CM | POA: Diagnosis not present

## 2017-09-28 DIAGNOSIS — Z23 Encounter for immunization: Secondary | ICD-10-CM | POA: Diagnosis not present

## 2017-09-28 DIAGNOSIS — Z6828 Body mass index (BMI) 28.0-28.9, adult: Secondary | ICD-10-CM | POA: Diagnosis not present

## 2017-09-28 DIAGNOSIS — G35 Multiple sclerosis: Secondary | ICD-10-CM | POA: Diagnosis not present

## 2017-09-28 DIAGNOSIS — E7849 Other hyperlipidemia: Secondary | ICD-10-CM | POA: Diagnosis not present

## 2017-09-28 DIAGNOSIS — E1029 Type 1 diabetes mellitus with other diabetic kidney complication: Secondary | ICD-10-CM | POA: Diagnosis not present

## 2017-09-28 DIAGNOSIS — I129 Hypertensive chronic kidney disease with stage 1 through stage 4 chronic kidney disease, or unspecified chronic kidney disease: Secondary | ICD-10-CM | POA: Diagnosis not present

## 2017-09-28 DIAGNOSIS — Z1389 Encounter for screening for other disorder: Secondary | ICD-10-CM | POA: Diagnosis not present

## 2017-09-28 DIAGNOSIS — I10 Essential (primary) hypertension: Secondary | ICD-10-CM | POA: Diagnosis not present

## 2017-09-28 DIAGNOSIS — Z0000 Encounter for general adult medical examination without abnormal findings: Secondary | ICD-10-CM | POA: Diagnosis not present

## 2017-10-03 DIAGNOSIS — E1029 Type 1 diabetes mellitus with other diabetic kidney complication: Secondary | ICD-10-CM | POA: Diagnosis not present

## 2017-10-18 NOTE — Progress Notes (Signed)
Subjective:  Patient ID: [name] Mc., male    DOB: 06/20/1945,  MRN: [id number]  Chief Complaint  Patient presents with  . Nail Problem    60 + day nail trim - accidentally knocked off a few toenails during recent hospital stay on the right    72 y.o. male presents with the above complaint.  On Plavix for anticoagulation.  Since the toenails been coming up after recent hospital stay.   Review of Systems: Negative except as noted in the HPI. Denies N/V/F/Ch.  Past Medical History:  Diagnosis Date  . Arthritis   . Benign prostatic hypertrophy with hesitancy   . 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 Chronic pain syndrome followed by dr mark phillps at pain clinic   secondary to [place] and diabetic neuropathy  . Chronic periodontal disease   . CKD (chronic kidney disease), stage II    hypertensive per PCP note  . 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 Depression   . Diabetic neuropathy ([place])   . Essential tremor   . Gait disorder    uses walker  . History of CVA (cerebrovascular accident)    2011-  right subcortical cva--  residual left mininmal side weakness and no use of left head  . 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 History of diabetic ulcer of foot    2010  left foot  . History of MI (myocardial infarction)    2005  . Hypertension    dr Reynaldo Minium  . MS (multiple sclerosis) ([place])    dx 1985  . PONV (postoperative nausea and vomiting)   . S/P drug eluting coronary stent placement    2005  to [place]  . Type 1 diabetes mellitus on insulin therapy (HCC)     Current Outpatient Medications:  .  amLODipine (NORVASC) 5 MG tablet, Take 5 mg by mouth every morning. , Disp: , Rfl:  .  amoxicillin (AMOXIL) 500 MG tablet, Take 500 mg by mouth 3 (three) times daily., Disp: , Rfl:  .  aspirin 81  MG tablet, Take 81 mg by mouth daily., Disp: , Rfl:  .  atorvastatin (LIPITOR) 10 MG tablet, Take 10 mg by mouth every morning. , Disp: , Rfl:  .  clopidogrel (PLAVIX) 75 MG tablet, Take 75 mg by mouth daily., Disp: , Rfl:  .  DULoxetine (CYMBALTA) 60 MG capsule, Take 60 mg by mouth 2 (two) times daily., Disp: , Rfl:  .  finasteride (PROSCAR) 5 MG tablet, Take 5 mg by mouth every morning. , Disp: , Rfl:  .  insulin aspart (NOVOLOG FLEXPEN) 100 UNIT/ML FlexPen, Inject into the skin 2 (two) times daily. Sliding scale, Disp: , Rfl:  .  Insulin NPH, Human,, Isophane, (HUMULIN N KWIKPEN) 100 UNIT/ML Kiwkpen, Inject into the skin 2 (two) times daily. 38-39 units in AM  And 18-19  units in PM, Disp: , Rfl:  .  metoprolol tartrate (LOPRESSOR) 25 MG tablet, Take 25 mg by mouth 2 (two) times daily., Disp: , Rfl:  .  Multiple Vitamin (MULTIVITAMIN WITH MINERALS) TABS tablet, Take 1 tablet by mouth daily., Disp: , Rfl:  .  nitroGLYCERIN (NITRO-DUR) 0.4 mg/hr patch, Apply patch to the white flesh of the toe daily., Disp:  30 patch, Rfl: 0 .  nitroGLYCERIN (NITROSTAT) 0.4 MG SL tablet, Place 0.4 mg under the tongue every 5 (five) minutes as needed for chest pain (x 3 doses). For chest pain, Disp: , Rfl:  .  oxycodone (ROXICODONE) 30 MG immediate release tablet, Take 30 mg by mouth 6 (six) times daily., Disp: , Rfl:  .  polyethylene glycol (MIRALAX / GLYCOLAX) packet, Take 17 g by mouth daily., Disp: , Rfl:  .  Tapentadol HCl (NUCYNTA ER) 250 MG TB12, Take 1 tablet by mouth 2 (two) times daily., Disp: , Rfl:  .  traZODone (DESYREL) 150 MG tablet, Take 150 mg by mouth at bedtime., Disp: , Rfl:   Social History   Tobacco Use  Smoking Status Former Smoker  . Packs/day: 0.50  . Years: 26.00  . Pack years: 13.00  . Types: Cigars, Cigarettes  . Last attempt to quit: 02/17/2012  . Years since quitting: 5.6  Smokeless Tobacco Never Used  Tobacco Comment   stopped cigar's 2014/  cigarette's stopped 1990's    No  Known Allergies Objective:  There were no vitals filed for this visit. There is no height or weight on file to calculate BMI. Constitutional Well developed. Well nourished.  Vascular Dorsalis pedis pulses palpable bilaterally. Posterior tibial pulses palpable bilaterally. Capillary refill normal to all digits.  No cyanosis or clubbing noted. Pedal hair growth normal.  Neurologic Normal speech. Oriented to person, place, and time. Epicritic sensation to light touch grossly present bilaterally.  Dermatologic Nails elongated and dystrophic. No open wounds. No skin lesions.  Orthopedic: Normal joint ROM without pain or crepitus bilaterally. No visible deformities. No bony tenderness.   Radiographs: None Assessment:   1. 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  Onychomycosis with coagulation defect -Nails debrided x6 -R 3rd toe cut during debridement. Advised to apply abx ointment. Gentle compression dressing applied advised to remove tonight.  Procedure: Nail Debridement Rationale: Patient meets criteria for routine foot care due to coag defect. Type of Debridement: manual, sharp debridement. Instrumentation: Nail nipper, rotary burr. Number of Nails: 6     No follow-ups on file.

## 2017-10-24 DIAGNOSIS — R251 Tremor, unspecified: Secondary | ICD-10-CM | POA: Diagnosis not present

## 2017-10-24 DIAGNOSIS — G894 Chronic pain syndrome: Secondary | ICD-10-CM | POA: Diagnosis not present

## 2017-10-24 DIAGNOSIS — E1042 Type 1 diabetes mellitus with diabetic polyneuropathy: Secondary | ICD-10-CM | POA: Diagnosis not present

## 2017-10-24 DIAGNOSIS — Z79891 Long term (current) use of opiate analgesic: Secondary | ICD-10-CM | POA: Diagnosis not present

## 2017-11-21 DIAGNOSIS — G894 Chronic pain syndrome: Secondary | ICD-10-CM | POA: Diagnosis not present

## 2017-11-21 DIAGNOSIS — R251 Tremor, unspecified: Secondary | ICD-10-CM | POA: Diagnosis not present

## 2017-11-21 DIAGNOSIS — E1042 Type 1 diabetes mellitus with diabetic polyneuropathy: Secondary | ICD-10-CM | POA: Diagnosis not present

## 2017-11-21 DIAGNOSIS — Z79891 Long term (current) use of opiate analgesic: Secondary | ICD-10-CM | POA: Diagnosis not present

## 2017-12-26 DIAGNOSIS — E1042 Type 1 diabetes mellitus with diabetic polyneuropathy: Secondary | ICD-10-CM | POA: Diagnosis not present

## 2017-12-26 DIAGNOSIS — G894 Chronic pain syndrome: Secondary | ICD-10-CM | POA: Diagnosis not present

## 2017-12-26 DIAGNOSIS — Z79891 Long term (current) use of opiate analgesic: Secondary | ICD-10-CM | POA: Diagnosis not present

## 2017-12-26 DIAGNOSIS — R251 Tremor, unspecified: Secondary | ICD-10-CM | POA: Diagnosis not present

## 2018-01-29 DIAGNOSIS — E1042 Type 1 diabetes mellitus with diabetic polyneuropathy: Secondary | ICD-10-CM | POA: Diagnosis not present

## 2018-01-29 DIAGNOSIS — R251 Tremor, unspecified: Secondary | ICD-10-CM | POA: Diagnosis not present

## 2018-01-29 DIAGNOSIS — Z79891 Long term (current) use of opiate analgesic: Secondary | ICD-10-CM | POA: Diagnosis not present

## 2018-01-29 DIAGNOSIS — G894 Chronic pain syndrome: Secondary | ICD-10-CM | POA: Diagnosis not present

## 2018-02-27 DIAGNOSIS — E1042 Type 1 diabetes mellitus with diabetic polyneuropathy: Secondary | ICD-10-CM | POA: Diagnosis not present

## 2018-02-27 DIAGNOSIS — G894 Chronic pain syndrome: Secondary | ICD-10-CM | POA: Diagnosis not present

## 2018-02-27 DIAGNOSIS — R251 Tremor, unspecified: Secondary | ICD-10-CM | POA: Diagnosis not present

## 2018-02-27 DIAGNOSIS — Z79891 Long term (current) use of opiate analgesic: Secondary | ICD-10-CM | POA: Diagnosis not present

## 2018-02-28 DIAGNOSIS — E1029 Type 1 diabetes mellitus with other diabetic kidney complication: Secondary | ICD-10-CM | POA: Diagnosis not present

## 2018-02-28 DIAGNOSIS — G629 Polyneuropathy, unspecified: Secondary | ICD-10-CM | POA: Diagnosis not present

## 2018-02-28 DIAGNOSIS — R251 Tremor, unspecified: Secondary | ICD-10-CM | POA: Diagnosis not present

## 2018-02-28 DIAGNOSIS — I10 Essential (primary) hypertension: Secondary | ICD-10-CM | POA: Diagnosis not present

## 2018-02-28 DIAGNOSIS — E7849 Other hyperlipidemia: Secondary | ICD-10-CM | POA: Diagnosis not present

## 2018-02-28 DIAGNOSIS — R296 Repeated falls: Secondary | ICD-10-CM | POA: Diagnosis not present

## 2018-02-28 DIAGNOSIS — Z1331 Encounter for screening for depression: Secondary | ICD-10-CM | POA: Diagnosis not present

## 2018-02-28 DIAGNOSIS — I69959 Hemiplegia and hemiparesis following unspecified cerebrovascular disease affecting unspecified side: Secondary | ICD-10-CM | POA: Diagnosis not present

## 2018-02-28 DIAGNOSIS — E1059 Type 1 diabetes mellitus with other circulatory complications: Secondary | ICD-10-CM | POA: Diagnosis not present

## 2018-02-28 DIAGNOSIS — G894 Chronic pain syndrome: Secondary | ICD-10-CM | POA: Diagnosis not present

## 2018-02-28 DIAGNOSIS — I2510 Atherosclerotic heart disease of native coronary artery without angina pectoris: Secondary | ICD-10-CM | POA: Diagnosis not present

## 2018-03-14 DIAGNOSIS — I69354 Hemiplegia and hemiparesis following cerebral infarction affecting left non-dominant side: Secondary | ICD-10-CM | POA: Diagnosis not present

## 2018-03-14 DIAGNOSIS — E1059 Type 1 diabetes mellitus with other circulatory complications: Secondary | ICD-10-CM | POA: Diagnosis not present

## 2018-03-14 DIAGNOSIS — Z87891 Personal history of nicotine dependence: Secondary | ICD-10-CM | POA: Diagnosis not present

## 2018-03-14 DIAGNOSIS — N182 Chronic kidney disease, stage 2 (mild): Secondary | ICD-10-CM | POA: Diagnosis not present

## 2018-03-14 DIAGNOSIS — I2510 Atherosclerotic heart disease of native coronary artery without angina pectoris: Secondary | ICD-10-CM | POA: Diagnosis not present

## 2018-03-14 DIAGNOSIS — Z9181 History of falling: Secondary | ICD-10-CM | POA: Diagnosis not present

## 2018-03-14 DIAGNOSIS — Z79899 Other long term (current) drug therapy: Secondary | ICD-10-CM | POA: Diagnosis not present

## 2018-03-14 DIAGNOSIS — G894 Chronic pain syndrome: Secondary | ICD-10-CM | POA: Diagnosis not present

## 2018-03-14 DIAGNOSIS — I129 Hypertensive chronic kidney disease with stage 1 through stage 4 chronic kidney disease, or unspecified chronic kidney disease: Secondary | ICD-10-CM | POA: Diagnosis not present

## 2018-03-14 DIAGNOSIS — Z7982 Long term (current) use of aspirin: Secondary | ICD-10-CM | POA: Diagnosis not present

## 2018-03-14 DIAGNOSIS — Z7902 Long term (current) use of antithrombotics/antiplatelets: Secondary | ICD-10-CM | POA: Diagnosis not present

## 2018-03-14 DIAGNOSIS — E1022 Type 1 diabetes mellitus with diabetic chronic kidney disease: Secondary | ICD-10-CM | POA: Diagnosis not present

## 2018-03-14 DIAGNOSIS — E1042 Type 1 diabetes mellitus with diabetic polyneuropathy: Secondary | ICD-10-CM | POA: Diagnosis not present

## 2018-03-14 DIAGNOSIS — G35 Multiple sclerosis: Secondary | ICD-10-CM | POA: Diagnosis not present

## 2018-03-20 DIAGNOSIS — E1042 Type 1 diabetes mellitus with diabetic polyneuropathy: Secondary | ICD-10-CM | POA: Diagnosis not present

## 2018-03-20 DIAGNOSIS — I2510 Atherosclerotic heart disease of native coronary artery without angina pectoris: Secondary | ICD-10-CM | POA: Diagnosis not present

## 2018-03-20 DIAGNOSIS — E1059 Type 1 diabetes mellitus with other circulatory complications: Secondary | ICD-10-CM | POA: Diagnosis not present

## 2018-03-20 DIAGNOSIS — G35 Multiple sclerosis: Secondary | ICD-10-CM | POA: Diagnosis not present

## 2018-03-20 DIAGNOSIS — I69354 Hemiplegia and hemiparesis following cerebral infarction affecting left non-dominant side: Secondary | ICD-10-CM | POA: Diagnosis not present

## 2018-03-20 DIAGNOSIS — G894 Chronic pain syndrome: Secondary | ICD-10-CM | POA: Diagnosis not present

## 2018-03-22 DIAGNOSIS — I69354 Hemiplegia and hemiparesis following cerebral infarction affecting left non-dominant side: Secondary | ICD-10-CM | POA: Diagnosis not present

## 2018-03-22 DIAGNOSIS — E1042 Type 1 diabetes mellitus with diabetic polyneuropathy: Secondary | ICD-10-CM | POA: Diagnosis not present

## 2018-03-22 DIAGNOSIS — E1059 Type 1 diabetes mellitus with other circulatory complications: Secondary | ICD-10-CM | POA: Diagnosis not present

## 2018-03-22 DIAGNOSIS — I2510 Atherosclerotic heart disease of native coronary artery without angina pectoris: Secondary | ICD-10-CM | POA: Diagnosis not present

## 2018-03-22 DIAGNOSIS — G35 Multiple sclerosis: Secondary | ICD-10-CM | POA: Diagnosis not present

## 2018-03-22 DIAGNOSIS — G894 Chronic pain syndrome: Secondary | ICD-10-CM | POA: Diagnosis not present

## 2018-03-27 DIAGNOSIS — Z79891 Long term (current) use of opiate analgesic: Secondary | ICD-10-CM | POA: Diagnosis not present

## 2018-03-27 DIAGNOSIS — G894 Chronic pain syndrome: Secondary | ICD-10-CM | POA: Diagnosis not present

## 2018-03-27 DIAGNOSIS — R251 Tremor, unspecified: Secondary | ICD-10-CM | POA: Diagnosis not present

## 2018-03-27 DIAGNOSIS — E1042 Type 1 diabetes mellitus with diabetic polyneuropathy: Secondary | ICD-10-CM | POA: Diagnosis not present

## 2018-03-28 DIAGNOSIS — G894 Chronic pain syndrome: Secondary | ICD-10-CM | POA: Diagnosis not present

## 2018-03-28 DIAGNOSIS — E1059 Type 1 diabetes mellitus with other circulatory complications: Secondary | ICD-10-CM | POA: Diagnosis not present

## 2018-03-28 DIAGNOSIS — E1042 Type 1 diabetes mellitus with diabetic polyneuropathy: Secondary | ICD-10-CM | POA: Diagnosis not present

## 2018-03-28 DIAGNOSIS — I2510 Atherosclerotic heart disease of native coronary artery without angina pectoris: Secondary | ICD-10-CM | POA: Diagnosis not present

## 2018-03-28 DIAGNOSIS — G35 Multiple sclerosis: Secondary | ICD-10-CM | POA: Diagnosis not present

## 2018-03-28 DIAGNOSIS — I69354 Hemiplegia and hemiparesis following cerebral infarction affecting left non-dominant side: Secondary | ICD-10-CM | POA: Diagnosis not present

## 2018-04-02 DIAGNOSIS — I69354 Hemiplegia and hemiparesis following cerebral infarction affecting left non-dominant side: Secondary | ICD-10-CM | POA: Diagnosis not present

## 2018-04-02 DIAGNOSIS — G35 Multiple sclerosis: Secondary | ICD-10-CM | POA: Diagnosis not present

## 2018-04-02 DIAGNOSIS — E1042 Type 1 diabetes mellitus with diabetic polyneuropathy: Secondary | ICD-10-CM | POA: Diagnosis not present

## 2018-04-02 DIAGNOSIS — I2510 Atherosclerotic heart disease of native coronary artery without angina pectoris: Secondary | ICD-10-CM | POA: Diagnosis not present

## 2018-04-02 DIAGNOSIS — G894 Chronic pain syndrome: Secondary | ICD-10-CM | POA: Diagnosis not present

## 2018-04-02 DIAGNOSIS — E1059 Type 1 diabetes mellitus with other circulatory complications: Secondary | ICD-10-CM | POA: Diagnosis not present

## 2018-04-04 DIAGNOSIS — I2510 Atherosclerotic heart disease of native coronary artery without angina pectoris: Secondary | ICD-10-CM | POA: Diagnosis not present

## 2018-04-04 DIAGNOSIS — E1042 Type 1 diabetes mellitus with diabetic polyneuropathy: Secondary | ICD-10-CM | POA: Diagnosis not present

## 2018-04-04 DIAGNOSIS — E1059 Type 1 diabetes mellitus with other circulatory complications: Secondary | ICD-10-CM | POA: Diagnosis not present

## 2018-04-04 DIAGNOSIS — G894 Chronic pain syndrome: Secondary | ICD-10-CM | POA: Diagnosis not present

## 2018-04-04 DIAGNOSIS — G35 Multiple sclerosis: Secondary | ICD-10-CM | POA: Diagnosis not present

## 2018-04-04 DIAGNOSIS — I69354 Hemiplegia and hemiparesis following cerebral infarction affecting left non-dominant side: Secondary | ICD-10-CM | POA: Diagnosis not present

## 2018-04-24 DIAGNOSIS — R251 Tremor, unspecified: Secondary | ICD-10-CM | POA: Diagnosis not present

## 2018-04-24 DIAGNOSIS — E1042 Type 1 diabetes mellitus with diabetic polyneuropathy: Secondary | ICD-10-CM | POA: Diagnosis not present

## 2018-04-24 DIAGNOSIS — Z79891 Long term (current) use of opiate analgesic: Secondary | ICD-10-CM | POA: Diagnosis not present

## 2018-04-24 DIAGNOSIS — G894 Chronic pain syndrome: Secondary | ICD-10-CM | POA: Diagnosis not present

## 2018-05-02 ENCOUNTER — Ambulatory Visit: Payer: Medicare Other | Admitting: Podiatry

## 2018-05-22 DIAGNOSIS — G894 Chronic pain syndrome: Secondary | ICD-10-CM | POA: Diagnosis not present

## 2018-05-22 DIAGNOSIS — Z79891 Long term (current) use of opiate analgesic: Secondary | ICD-10-CM | POA: Diagnosis not present

## 2018-05-22 DIAGNOSIS — E1042 Type 1 diabetes mellitus with diabetic polyneuropathy: Secondary | ICD-10-CM | POA: Diagnosis not present

## 2018-05-22 DIAGNOSIS — R251 Tremor, unspecified: Secondary | ICD-10-CM | POA: Diagnosis not present

## 2018-06-13 ENCOUNTER — Encounter: Payer: Self-pay | Admitting: Podiatry

## 2018-06-13 ENCOUNTER — Other Ambulatory Visit: Payer: Self-pay

## 2018-06-13 ENCOUNTER — Ambulatory Visit (INDEPENDENT_AMBULATORY_CARE_PROVIDER_SITE_OTHER): Payer: Medicare Other | Admitting: Podiatry

## 2018-06-13 ENCOUNTER — Encounter

## 2018-06-13 VITALS — Temp 98.1°F

## 2018-06-13 DIAGNOSIS — E1142 Type 2 diabetes mellitus with diabetic polyneuropathy: Secondary | ICD-10-CM

## 2018-06-13 DIAGNOSIS — M79676 Pain in unspecified toe(s): Secondary | ICD-10-CM

## 2018-06-13 DIAGNOSIS — D689 Coagulation defect, unspecified: Secondary | ICD-10-CM

## 2018-06-13 DIAGNOSIS — B351 Tinea unguium: Secondary | ICD-10-CM | POA: Diagnosis not present

## 2018-06-13 DIAGNOSIS — E1159 Type 2 diabetes mellitus with other circulatory complications: Secondary | ICD-10-CM

## 2018-06-13 NOTE — Progress Notes (Signed)
Complaint:  Visit Type: Patient returns to my office for continued preventative foot care services. Complaint: Patient states" my nails have grown long and thick and become painful to walk and wear shoes" Patient has been diagnosed with DM with neuropathy and angiopathy. The patient presents for preventative foot care services. No changes to ROS  Podiatric Exam: Vascular: dorsalis pedis and posterior tibial pulses are not  palpable bilateral. Capillary return is diminished. Cold feet noted.. Skin turgor WNL  Sensorium: Absent Semmes Weinstein monofilament test. Absent tactile sensation bilaterally. Nail Exam: Pt has thick disfigured discolored nails with subungual debris noted bilateral entire nail hallux through fifth toenails Ulcer Exam: There is no evidence of ulcer or pre-ulcerative changes or infection. Orthopedic Exam: Muscle tone and strength are WNL. No limitations in general ROM. No crepitus or effusions noted. Foot type and digits show no abnormalities. Bony prominences are unremarkable. PTTD left foot. Skin: No Porokeratosis. No infection or ulcers  Diagnosis:  Onychomycosis, , Pain in right toe, pain in left toes  Treatment & Plan Procedures and Treatment: Consent by patient was obtained for treatment procedures.   Debridement of mycotic and hypertrophic toenails, 1 through 5 bilateral and clearing of subungual debris. No ulceration, no infection noted.  Discussed this discussed this patient's feet with this patient and his daughter.  This patient is scheduled to be evaluated by [facility] for shoes and/or braces.  He qualifies for shoes due to DPN and Diabetes with vascular  findings and history of ulcer digit right foot. Return Visit-Office Procedure: Patient instructed to return to the office for a follow up visit 3 months for continued evaluation and treatment.    Gardiner Barefoot DPM

## 2018-06-19 DIAGNOSIS — G894 Chronic pain syndrome: Secondary | ICD-10-CM | POA: Diagnosis not present

## 2018-06-19 DIAGNOSIS — E1042 Type 1 diabetes mellitus with diabetic polyneuropathy: Secondary | ICD-10-CM | POA: Diagnosis not present

## 2018-06-19 DIAGNOSIS — Z79891 Long term (current) use of opiate analgesic: Secondary | ICD-10-CM | POA: Diagnosis not present

## 2018-06-19 DIAGNOSIS — R251 Tremor, unspecified: Secondary | ICD-10-CM | POA: Diagnosis not present

## 2018-06-20 DIAGNOSIS — H43811 Vitreous degeneration, right eye: Secondary | ICD-10-CM | POA: Diagnosis not present

## 2018-06-20 DIAGNOSIS — E119 Type 2 diabetes mellitus without complications: Secondary | ICD-10-CM | POA: Diagnosis not present

## 2018-06-22 DIAGNOSIS — E1029 Type 1 diabetes mellitus with other diabetic kidney complication: Secondary | ICD-10-CM | POA: Diagnosis not present

## 2018-06-26 ENCOUNTER — Other Ambulatory Visit: Payer: Self-pay

## 2018-06-26 ENCOUNTER — Ambulatory Visit: Payer: Medicare Other | Admitting: Orthotics

## 2018-06-26 DIAGNOSIS — E1142 Type 2 diabetes mellitus with diabetic polyneuropathy: Secondary | ICD-10-CM

## 2018-06-26 DIAGNOSIS — E1042 Type 1 diabetes mellitus with diabetic polyneuropathy: Secondary | ICD-10-CM

## 2018-06-26 DIAGNOSIS — E1159 Type 2 diabetes mellitus with other circulatory complications: Secondary | ICD-10-CM

## 2018-06-26 DIAGNOSIS — L97511 Non-pressure chronic ulcer of other part of right foot limited to breakdown of skin: Secondary | ICD-10-CM

## 2018-06-26 NOTE — Progress Notes (Signed)
Patient will return to be cast for custom diabetic shoes due to foot deformity, chronic ulceration.

## 2018-07-04 DIAGNOSIS — G629 Polyneuropathy, unspecified: Secondary | ICD-10-CM | POA: Diagnosis not present

## 2018-07-04 DIAGNOSIS — E1040 Type 1 diabetes mellitus with diabetic neuropathy, unspecified: Secondary | ICD-10-CM | POA: Diagnosis not present

## 2018-07-04 DIAGNOSIS — I129 Hypertensive chronic kidney disease with stage 1 through stage 4 chronic kidney disease, or unspecified chronic kidney disease: Secondary | ICD-10-CM | POA: Diagnosis not present

## 2018-07-04 DIAGNOSIS — I69959 Hemiplegia and hemiparesis following unspecified cerebrovascular disease affecting unspecified side: Secondary | ICD-10-CM | POA: Diagnosis not present

## 2018-07-04 DIAGNOSIS — R251 Tremor, unspecified: Secondary | ICD-10-CM | POA: Diagnosis not present

## 2018-07-04 DIAGNOSIS — E1029 Type 1 diabetes mellitus with other diabetic kidney complication: Secondary | ICD-10-CM | POA: Diagnosis not present

## 2018-07-04 DIAGNOSIS — N182 Chronic kidney disease, stage 2 (mild): Secondary | ICD-10-CM | POA: Diagnosis not present

## 2018-07-04 DIAGNOSIS — E1059 Type 1 diabetes mellitus with other circulatory complications: Secondary | ICD-10-CM | POA: Diagnosis not present

## 2018-07-04 DIAGNOSIS — I2510 Atherosclerotic heart disease of native coronary artery without angina pectoris: Secondary | ICD-10-CM | POA: Diagnosis not present

## 2018-07-04 DIAGNOSIS — R296 Repeated falls: Secondary | ICD-10-CM | POA: Diagnosis not present

## 2018-07-04 DIAGNOSIS — I10 Essential (primary) hypertension: Secondary | ICD-10-CM | POA: Diagnosis not present

## 2018-07-04 DIAGNOSIS — E785 Hyperlipidemia, unspecified: Secondary | ICD-10-CM | POA: Diagnosis not present

## 2018-07-05 ENCOUNTER — Ambulatory Visit: Payer: Medicare Other | Admitting: Orthotics

## 2018-07-05 ENCOUNTER — Other Ambulatory Visit: Payer: Self-pay

## 2018-07-05 DIAGNOSIS — E1159 Type 2 diabetes mellitus with other circulatory complications: Secondary | ICD-10-CM

## 2018-07-05 DIAGNOSIS — E1042 Type 1 diabetes mellitus with diabetic polyneuropathy: Secondary | ICD-10-CM

## 2018-07-05 NOTE — Progress Notes (Signed)
Patient was measured for med necessity CUSTOM extra depth shoes (x2) w/ 3 pair (x6) custom f/o. Patient was measured w/ brannock to determine size and width.  Foam impression mold was achieved and deemed appropriate for fabrication of  cmfo.   See DPM chart notes for further documentation and dx codes for determination of medical necessity.  Appropriate forms will be sent to PCP to verify and sign off on medical necessity.   Patient presents w bilateral foot deformities, hx of ulcer, and MS.

## 2018-07-20 DIAGNOSIS — Z961 Presence of intraocular lens: Secondary | ICD-10-CM | POA: Diagnosis not present

## 2018-07-20 DIAGNOSIS — H43811 Vitreous degeneration, right eye: Secondary | ICD-10-CM | POA: Diagnosis not present

## 2018-07-20 DIAGNOSIS — H26492 Other secondary cataract, left eye: Secondary | ICD-10-CM | POA: Diagnosis not present

## 2018-07-24 DIAGNOSIS — E1042 Type 1 diabetes mellitus with diabetic polyneuropathy: Secondary | ICD-10-CM | POA: Diagnosis not present

## 2018-07-24 DIAGNOSIS — R251 Tremor, unspecified: Secondary | ICD-10-CM | POA: Diagnosis not present

## 2018-07-24 DIAGNOSIS — Z79891 Long term (current) use of opiate analgesic: Secondary | ICD-10-CM | POA: Diagnosis not present

## 2018-07-24 DIAGNOSIS — G894 Chronic pain syndrome: Secondary | ICD-10-CM | POA: Diagnosis not present

## 2018-08-21 DIAGNOSIS — H26492 Other secondary cataract, left eye: Secondary | ICD-10-CM | POA: Diagnosis not present

## 2018-08-22 DIAGNOSIS — Z79891 Long term (current) use of opiate analgesic: Secondary | ICD-10-CM | POA: Diagnosis not present

## 2018-08-22 DIAGNOSIS — R251 Tremor, unspecified: Secondary | ICD-10-CM | POA: Diagnosis not present

## 2018-08-22 DIAGNOSIS — G894 Chronic pain syndrome: Secondary | ICD-10-CM | POA: Diagnosis not present

## 2018-08-22 DIAGNOSIS — E1042 Type 1 diabetes mellitus with diabetic polyneuropathy: Secondary | ICD-10-CM | POA: Diagnosis not present

## 2018-08-27 ENCOUNTER — Other Ambulatory Visit: Payer: Self-pay

## 2018-08-27 ENCOUNTER — Ambulatory Visit (INDEPENDENT_AMBULATORY_CARE_PROVIDER_SITE_OTHER): Payer: Medicare Other | Admitting: Orthotics

## 2018-08-27 DIAGNOSIS — E10621 Type 1 diabetes mellitus with foot ulcer: Secondary | ICD-10-CM

## 2018-08-27 DIAGNOSIS — E1159 Type 2 diabetes mellitus with other circulatory complications: Secondary | ICD-10-CM

## 2018-08-27 DIAGNOSIS — E1042 Type 1 diabetes mellitus with diabetic polyneuropathy: Secondary | ICD-10-CM | POA: Diagnosis not present

## 2018-08-27 DIAGNOSIS — M216X2 Other acquired deformities of left foot: Secondary | ICD-10-CM

## 2018-08-27 DIAGNOSIS — L97511 Non-pressure chronic ulcer of other part of right foot limited to breakdown of skin: Secondary | ICD-10-CM

## 2018-08-27 DIAGNOSIS — M216X1 Other acquired deformities of right foot: Secondary | ICD-10-CM | POA: Diagnosis not present

## 2018-08-27 DIAGNOSIS — E1142 Type 2 diabetes mellitus with diabetic polyneuropathy: Secondary | ICD-10-CM

## 2018-08-27 DIAGNOSIS — L84 Corns and callosities: Secondary | ICD-10-CM | POA: Diagnosis not present

## 2018-08-27 DIAGNOSIS — Z8673 Personal history of transient ischemic attack (TIA), and cerebral infarction without residual deficits: Secondary | ICD-10-CM

## 2018-08-27 DIAGNOSIS — L97509 Non-pressure chronic ulcer of other part of unspecified foot with unspecified severity: Secondary | ICD-10-CM

## 2018-08-27 DIAGNOSIS — N3001 Acute cystitis with hematuria: Secondary | ICD-10-CM

## 2018-09-20 DIAGNOSIS — G894 Chronic pain syndrome: Secondary | ICD-10-CM | POA: Diagnosis not present

## 2018-09-20 DIAGNOSIS — E1042 Type 1 diabetes mellitus with diabetic polyneuropathy: Secondary | ICD-10-CM | POA: Diagnosis not present

## 2018-09-20 DIAGNOSIS — Z79891 Long term (current) use of opiate analgesic: Secondary | ICD-10-CM | POA: Diagnosis not present

## 2018-09-20 DIAGNOSIS — R251 Tremor, unspecified: Secondary | ICD-10-CM | POA: Diagnosis not present

## 2018-10-17 DIAGNOSIS — E1042 Type 1 diabetes mellitus with diabetic polyneuropathy: Secondary | ICD-10-CM | POA: Diagnosis not present

## 2018-10-17 DIAGNOSIS — G894 Chronic pain syndrome: Secondary | ICD-10-CM | POA: Diagnosis not present

## 2018-10-17 DIAGNOSIS — Z79891 Long term (current) use of opiate analgesic: Secondary | ICD-10-CM | POA: Diagnosis not present

## 2018-10-17 DIAGNOSIS — R251 Tremor, unspecified: Secondary | ICD-10-CM | POA: Diagnosis not present

## 2018-10-29 DIAGNOSIS — I10 Essential (primary) hypertension: Secondary | ICD-10-CM | POA: Diagnosis not present

## 2018-11-05 DIAGNOSIS — Z0000 Encounter for general adult medical examination without abnormal findings: Secondary | ICD-10-CM | POA: Diagnosis not present

## 2018-11-05 DIAGNOSIS — G629 Polyneuropathy, unspecified: Secondary | ICD-10-CM | POA: Diagnosis not present

## 2018-11-05 DIAGNOSIS — I2510 Atherosclerotic heart disease of native coronary artery without angina pectoris: Secondary | ICD-10-CM | POA: Diagnosis not present

## 2018-11-05 DIAGNOSIS — E538 Deficiency of other specified B group vitamins: Secondary | ICD-10-CM | POA: Diagnosis not present

## 2018-11-05 DIAGNOSIS — E1040 Type 1 diabetes mellitus with diabetic neuropathy, unspecified: Secondary | ICD-10-CM | POA: Diagnosis not present

## 2018-11-05 DIAGNOSIS — Z125 Encounter for screening for malignant neoplasm of prostate: Secondary | ICD-10-CM | POA: Diagnosis not present

## 2018-11-05 DIAGNOSIS — I69959 Hemiplegia and hemiparesis following unspecified cerebrovascular disease affecting unspecified side: Secondary | ICD-10-CM | POA: Diagnosis not present

## 2018-11-05 DIAGNOSIS — E1029 Type 1 diabetes mellitus with other diabetic kidney complication: Secondary | ICD-10-CM | POA: Diagnosis not present

## 2018-11-05 DIAGNOSIS — E7849 Other hyperlipidemia: Secondary | ICD-10-CM | POA: Diagnosis not present

## 2018-11-05 DIAGNOSIS — Z23 Encounter for immunization: Secondary | ICD-10-CM | POA: Diagnosis not present

## 2018-11-05 DIAGNOSIS — E1059 Type 1 diabetes mellitus with other circulatory complications: Secondary | ICD-10-CM | POA: Diagnosis not present

## 2018-11-05 DIAGNOSIS — I10 Essential (primary) hypertension: Secondary | ICD-10-CM | POA: Diagnosis not present

## 2018-11-05 DIAGNOSIS — N401 Enlarged prostate with lower urinary tract symptoms: Secondary | ICD-10-CM | POA: Diagnosis not present

## 2018-11-08 DIAGNOSIS — R82998 Other abnormal findings in urine: Secondary | ICD-10-CM | POA: Diagnosis not present

## 2018-11-12 DIAGNOSIS — Z1212 Encounter for screening for malignant neoplasm of rectum: Secondary | ICD-10-CM | POA: Diagnosis not present

## 2018-11-14 DIAGNOSIS — R251 Tremor, unspecified: Secondary | ICD-10-CM | POA: Diagnosis not present

## 2018-11-14 DIAGNOSIS — G894 Chronic pain syndrome: Secondary | ICD-10-CM | POA: Diagnosis not present

## 2018-11-14 DIAGNOSIS — Z79891 Long term (current) use of opiate analgesic: Secondary | ICD-10-CM | POA: Diagnosis not present

## 2018-11-14 DIAGNOSIS — E1042 Type 1 diabetes mellitus with diabetic polyneuropathy: Secondary | ICD-10-CM | POA: Diagnosis not present

## 2018-12-20 DIAGNOSIS — R251 Tremor, unspecified: Secondary | ICD-10-CM | POA: Diagnosis not present

## 2018-12-20 DIAGNOSIS — E1042 Type 1 diabetes mellitus with diabetic polyneuropathy: Secondary | ICD-10-CM | POA: Diagnosis not present

## 2018-12-20 DIAGNOSIS — Z79891 Long term (current) use of opiate analgesic: Secondary | ICD-10-CM | POA: Diagnosis not present

## 2018-12-20 DIAGNOSIS — G894 Chronic pain syndrome: Secondary | ICD-10-CM | POA: Diagnosis not present

## 2019-01-23 ENCOUNTER — Ambulatory Visit (INDEPENDENT_AMBULATORY_CARE_PROVIDER_SITE_OTHER): Payer: Medicare Other | Admitting: Podiatry

## 2019-01-23 ENCOUNTER — Other Ambulatory Visit: Payer: Self-pay

## 2019-01-23 ENCOUNTER — Encounter: Payer: Self-pay | Admitting: Podiatry

## 2019-01-23 DIAGNOSIS — E1142 Type 2 diabetes mellitus with diabetic polyneuropathy: Secondary | ICD-10-CM

## 2019-01-23 DIAGNOSIS — B351 Tinea unguium: Secondary | ICD-10-CM | POA: Diagnosis not present

## 2019-01-23 DIAGNOSIS — D689 Coagulation defect, unspecified: Secondary | ICD-10-CM

## 2019-01-23 NOTE — Progress Notes (Signed)
Complaint:  Visit Type: Patient returns to my office for continued preventative foot care services. Complaint: Patient states" my nails have grown long and thick and become painful to walk and wear shoes" Patient has been diagnosed with DM with neuropathy and angiopathy. The patient presents for preventative foot care services. No changes to ROS  Podiatric Exam: Vascular: dorsalis pedis and posterior tibial pulses are not  palpable bilateral. Capillary return is diminished. Cold feet noted.. Skin turgor WNL  Sensorium: Absent Semmes Weinstein monofilament test. Absent tactile sensation bilaterally. Nail Exam: Pt has thick disfigured discolored nails with subungual debris noted bilateral entire nail hallux through fifth toenails Ulcer Exam: There is no evidence of ulcer or pre-ulcerative changes or infection. Orthopedic Exam: Muscle tone and strength are WNL. No limitations in general ROM. No crepitus or effusions noted. Foot type and digits show no abnormalities. Bony prominences are unremarkable. PTTD left foot. Skin: No Porokeratosis. No infection or ulcers  Diagnosis:  Onychomycosis, , Pain in right toe, pain in left toes  Treatment & Plan Procedures and Treatment: Consent by patient was obtained for treatment procedures.   Debridement of mycotic and hypertrophic toenails, 1 through 5 bilateral and clearing of subungual debris. No ulceration, no infection noted.  Discussed this discussed this patient's feet with this patient and his daughter.This patient is wearing his shoes. Return Visit-Office Procedure: Patient instructed to return to the office for a follow up visit 3 months for continued evaluation and treatment.    Gardiner Barefoot DPM

## 2019-03-07 DIAGNOSIS — I252 Old myocardial infarction: Secondary | ICD-10-CM | POA: Diagnosis not present

## 2019-03-07 DIAGNOSIS — E1029 Type 1 diabetes mellitus with other diabetic kidney complication: Secondary | ICD-10-CM | POA: Diagnosis not present

## 2019-03-07 DIAGNOSIS — I69959 Hemiplegia and hemiparesis following unspecified cerebrovascular disease affecting unspecified side: Secondary | ICD-10-CM | POA: Diagnosis not present

## 2019-03-07 DIAGNOSIS — E785 Hyperlipidemia, unspecified: Secondary | ICD-10-CM | POA: Diagnosis not present

## 2019-03-07 DIAGNOSIS — G35 Multiple sclerosis: Secondary | ICD-10-CM | POA: Diagnosis not present

## 2019-03-07 DIAGNOSIS — G629 Polyneuropathy, unspecified: Secondary | ICD-10-CM | POA: Diagnosis not present

## 2019-03-07 DIAGNOSIS — G894 Chronic pain syndrome: Secondary | ICD-10-CM | POA: Diagnosis not present

## 2019-03-07 DIAGNOSIS — E1059 Type 1 diabetes mellitus with other circulatory complications: Secondary | ICD-10-CM | POA: Diagnosis not present

## 2019-03-07 DIAGNOSIS — E1040 Type 1 diabetes mellitus with diabetic neuropathy, unspecified: Secondary | ICD-10-CM | POA: Diagnosis not present

## 2019-03-07 DIAGNOSIS — I10 Essential (primary) hypertension: Secondary | ICD-10-CM | POA: Diagnosis not present

## 2019-03-07 DIAGNOSIS — I2510 Atherosclerotic heart disease of native coronary artery without angina pectoris: Secondary | ICD-10-CM | POA: Diagnosis not present

## 2019-06-12 DIAGNOSIS — E1042 Type 1 diabetes mellitus with diabetic polyneuropathy: Secondary | ICD-10-CM | POA: Diagnosis not present

## 2019-06-12 DIAGNOSIS — G894 Chronic pain syndrome: Secondary | ICD-10-CM | POA: Diagnosis not present

## 2019-06-12 DIAGNOSIS — R251 Tremor, unspecified: Secondary | ICD-10-CM | POA: Diagnosis not present

## 2019-06-12 DIAGNOSIS — Z79891 Long term (current) use of opiate analgesic: Secondary | ICD-10-CM | POA: Diagnosis not present

## 2019-06-19 DIAGNOSIS — I2510 Atherosclerotic heart disease of native coronary artery without angina pectoris: Secondary | ICD-10-CM | POA: Diagnosis not present

## 2019-06-19 DIAGNOSIS — G35 Multiple sclerosis: Secondary | ICD-10-CM | POA: Diagnosis not present

## 2019-06-19 DIAGNOSIS — E1059 Type 1 diabetes mellitus with other circulatory complications: Secondary | ICD-10-CM | POA: Diagnosis not present

## 2019-06-19 DIAGNOSIS — H6121 Impacted cerumen, right ear: Secondary | ICD-10-CM | POA: Diagnosis not present

## 2019-06-19 DIAGNOSIS — I10 Essential (primary) hypertension: Secondary | ICD-10-CM | POA: Diagnosis not present

## 2019-06-19 DIAGNOSIS — G629 Polyneuropathy, unspecified: Secondary | ICD-10-CM | POA: Diagnosis not present

## 2019-06-19 DIAGNOSIS — F329 Major depressive disorder, single episode, unspecified: Secondary | ICD-10-CM | POA: Diagnosis not present

## 2019-06-19 DIAGNOSIS — R251 Tremor, unspecified: Secondary | ICD-10-CM | POA: Diagnosis not present

## 2019-06-19 DIAGNOSIS — I69959 Hemiplegia and hemiparesis following unspecified cerebrovascular disease affecting unspecified side: Secondary | ICD-10-CM | POA: Diagnosis not present

## 2019-06-19 DIAGNOSIS — E1029 Type 1 diabetes mellitus with other diabetic kidney complication: Secondary | ICD-10-CM | POA: Diagnosis not present

## 2019-06-19 DIAGNOSIS — E1040 Type 1 diabetes mellitus with diabetic neuropathy, unspecified: Secondary | ICD-10-CM | POA: Diagnosis not present

## 2019-06-19 DIAGNOSIS — G894 Chronic pain syndrome: Secondary | ICD-10-CM | POA: Diagnosis not present

## 2019-07-10 DIAGNOSIS — E1042 Type 1 diabetes mellitus with diabetic polyneuropathy: Secondary | ICD-10-CM | POA: Diagnosis not present

## 2019-07-10 DIAGNOSIS — Z79891 Long term (current) use of opiate analgesic: Secondary | ICD-10-CM | POA: Diagnosis not present

## 2019-07-10 DIAGNOSIS — G894 Chronic pain syndrome: Secondary | ICD-10-CM | POA: Diagnosis not present

## 2019-07-10 DIAGNOSIS — R251 Tremor, unspecified: Secondary | ICD-10-CM | POA: Diagnosis not present

## 2019-09-04 DIAGNOSIS — R251 Tremor, unspecified: Secondary | ICD-10-CM | POA: Diagnosis not present

## 2019-09-04 DIAGNOSIS — G894 Chronic pain syndrome: Secondary | ICD-10-CM | POA: Diagnosis not present

## 2019-09-04 DIAGNOSIS — Z79891 Long term (current) use of opiate analgesic: Secondary | ICD-10-CM | POA: Diagnosis not present

## 2019-09-04 DIAGNOSIS — E1042 Type 1 diabetes mellitus with diabetic polyneuropathy: Secondary | ICD-10-CM | POA: Diagnosis not present

## 2019-09-19 DIAGNOSIS — I69959 Hemiplegia and hemiparesis following unspecified cerebrovascular disease affecting unspecified side: Secondary | ICD-10-CM | POA: Diagnosis not present

## 2019-09-19 DIAGNOSIS — G894 Chronic pain syndrome: Secondary | ICD-10-CM | POA: Diagnosis not present

## 2019-09-19 DIAGNOSIS — I129 Hypertensive chronic kidney disease with stage 1 through stage 4 chronic kidney disease, or unspecified chronic kidney disease: Secondary | ICD-10-CM | POA: Diagnosis not present

## 2019-09-19 DIAGNOSIS — E1059 Type 1 diabetes mellitus with other circulatory complications: Secondary | ICD-10-CM | POA: Diagnosis not present

## 2019-09-19 DIAGNOSIS — N182 Chronic kidney disease, stage 2 (mild): Secondary | ICD-10-CM | POA: Diagnosis not present

## 2019-09-19 DIAGNOSIS — M25531 Pain in right wrist: Secondary | ICD-10-CM | POA: Diagnosis not present

## 2019-09-19 DIAGNOSIS — M79641 Pain in right hand: Secondary | ICD-10-CM | POA: Diagnosis not present

## 2019-09-19 DIAGNOSIS — Z7409 Other reduced mobility: Secondary | ICD-10-CM | POA: Diagnosis not present

## 2019-09-19 DIAGNOSIS — I10 Essential (primary) hypertension: Secondary | ICD-10-CM | POA: Diagnosis not present

## 2019-10-03 DIAGNOSIS — Z79891 Long term (current) use of opiate analgesic: Secondary | ICD-10-CM | POA: Diagnosis not present

## 2019-10-03 DIAGNOSIS — R251 Tremor, unspecified: Secondary | ICD-10-CM | POA: Diagnosis not present

## 2019-10-03 DIAGNOSIS — E1042 Type 1 diabetes mellitus with diabetic polyneuropathy: Secondary | ICD-10-CM | POA: Diagnosis not present

## 2019-10-03 DIAGNOSIS — G894 Chronic pain syndrome: Secondary | ICD-10-CM | POA: Diagnosis not present

## 2019-10-22 ENCOUNTER — Ambulatory Visit (INDEPENDENT_AMBULATORY_CARE_PROVIDER_SITE_OTHER): Payer: Medicare Other | Admitting: Podiatry

## 2019-10-22 ENCOUNTER — Encounter: Payer: Self-pay | Admitting: Podiatry

## 2019-10-22 ENCOUNTER — Other Ambulatory Visit: Payer: Self-pay

## 2019-10-22 DIAGNOSIS — D689 Coagulation defect, unspecified: Secondary | ICD-10-CM | POA: Diagnosis not present

## 2019-10-22 DIAGNOSIS — B351 Tinea unguium: Secondary | ICD-10-CM

## 2019-10-22 DIAGNOSIS — E1142 Type 2 diabetes mellitus with diabetic polyneuropathy: Secondary | ICD-10-CM

## 2019-10-22 NOTE — Progress Notes (Signed)
This patient returns to my office for at risk foot care.  This patient requires this care by a professional since this patient will be at risk due to having chronic kidney disease and diabetes.This patient  is unable to cut nails himself since the patient cannot reach his nails.These nails are painful walking and wearing shoes.  This patient presents for at risk foot care today. He presents to the office with his daughter.  General Appearance  Alert, conversant and in no acute stress.  Vascular  Dorsalis pedis and posterior tibial  pulses are not  palpable  bilaterally.  Capillary return is diminished   bilaterally. Temperature is within normal limits  bilaterally.  Neurologic  Senn-Weinstein monofilament wire test absent   bilaterally. Muscle power within normal limits bilaterally.  Nails Thick disfigured discolored nails with subungual debris  from hallux to fifth toes bilaterally. No evidence of bacterial infection or drainage bilaterally.  Orthopedic  No limitations of motion  feet .  No crepitus or effusions noted.  No bony pathology or digital deformities noted.  Skin  normotropic skin with no porokeratosis noted bilaterally.  No signs of infections or ulcers noted.     Onychomycosis  Pain in right toes  Pain in left toes  Consent was obtained for treatment procedures.   Mechanical debridement of nails 1-5  bilaterally performed with a nail nipper.  Filed with dremel without incident.    Return office visit   6 months                  Told patient to return for periodic foot care and evaluation due to potential at risk complications.   Gardiner Barefoot DPM

## 2019-10-30 DIAGNOSIS — R251 Tremor, unspecified: Secondary | ICD-10-CM | POA: Diagnosis not present

## 2019-10-30 DIAGNOSIS — E1042 Type 1 diabetes mellitus with diabetic polyneuropathy: Secondary | ICD-10-CM | POA: Diagnosis not present

## 2019-10-30 DIAGNOSIS — G894 Chronic pain syndrome: Secondary | ICD-10-CM | POA: Diagnosis not present

## 2019-10-30 DIAGNOSIS — Z79891 Long term (current) use of opiate analgesic: Secondary | ICD-10-CM | POA: Diagnosis not present

## 2019-11-27 DIAGNOSIS — E1042 Type 1 diabetes mellitus with diabetic polyneuropathy: Secondary | ICD-10-CM | POA: Diagnosis not present

## 2019-11-27 DIAGNOSIS — G894 Chronic pain syndrome: Secondary | ICD-10-CM | POA: Diagnosis not present

## 2019-11-27 DIAGNOSIS — Z79891 Long term (current) use of opiate analgesic: Secondary | ICD-10-CM | POA: Diagnosis not present

## 2019-11-27 DIAGNOSIS — R251 Tremor, unspecified: Secondary | ICD-10-CM | POA: Diagnosis not present

## 2019-12-23 DIAGNOSIS — E1040 Type 1 diabetes mellitus with diabetic neuropathy, unspecified: Secondary | ICD-10-CM | POA: Diagnosis not present

## 2019-12-23 DIAGNOSIS — I129 Hypertensive chronic kidney disease with stage 1 through stage 4 chronic kidney disease, or unspecified chronic kidney disease: Secondary | ICD-10-CM | POA: Diagnosis not present

## 2019-12-23 DIAGNOSIS — Z794 Long term (current) use of insulin: Secondary | ICD-10-CM | POA: Diagnosis not present

## 2019-12-23 DIAGNOSIS — Z23 Encounter for immunization: Secondary | ICD-10-CM | POA: Diagnosis not present

## 2019-12-23 DIAGNOSIS — G35 Multiple sclerosis: Secondary | ICD-10-CM | POA: Diagnosis not present

## 2019-12-23 DIAGNOSIS — E1029 Type 1 diabetes mellitus with other diabetic kidney complication: Secondary | ICD-10-CM | POA: Diagnosis not present

## 2019-12-23 DIAGNOSIS — I10 Essential (primary) hypertension: Secondary | ICD-10-CM | POA: Diagnosis not present

## 2019-12-25 DIAGNOSIS — E1042 Type 1 diabetes mellitus with diabetic polyneuropathy: Secondary | ICD-10-CM | POA: Diagnosis not present

## 2019-12-25 DIAGNOSIS — R251 Tremor, unspecified: Secondary | ICD-10-CM | POA: Diagnosis not present

## 2019-12-25 DIAGNOSIS — Z79891 Long term (current) use of opiate analgesic: Secondary | ICD-10-CM | POA: Diagnosis not present

## 2019-12-25 DIAGNOSIS — G894 Chronic pain syndrome: Secondary | ICD-10-CM | POA: Diagnosis not present

## 2020-01-01 DIAGNOSIS — Z961 Presence of intraocular lens: Secondary | ICD-10-CM | POA: Diagnosis not present

## 2020-01-01 DIAGNOSIS — E119 Type 2 diabetes mellitus without complications: Secondary | ICD-10-CM | POA: Diagnosis not present

## 2020-01-13 ENCOUNTER — Ambulatory Visit: Payer: Medicare Other | Attending: Internal Medicine

## 2020-01-13 ENCOUNTER — Other Ambulatory Visit (HOSPITAL_BASED_OUTPATIENT_CLINIC_OR_DEPARTMENT_OTHER): Payer: Self-pay | Admitting: Internal Medicine

## 2020-01-13 DIAGNOSIS — Z23 Encounter for immunization: Secondary | ICD-10-CM

## 2020-01-21 MED FILL — PFIZER-BIONTECH COVID-19 VA: 30 | 1 days supply | Qty: 0 | Fill #0

## 2020-01-22 DIAGNOSIS — Z79891 Long term (current) use of opiate analgesic: Secondary | ICD-10-CM | POA: Diagnosis not present

## 2020-01-22 DIAGNOSIS — G894 Chronic pain syndrome: Secondary | ICD-10-CM | POA: Diagnosis not present

## 2020-01-22 DIAGNOSIS — E1042 Type 1 diabetes mellitus with diabetic polyneuropathy: Secondary | ICD-10-CM | POA: Diagnosis not present

## 2020-01-22 DIAGNOSIS — R251 Tremor, unspecified: Secondary | ICD-10-CM | POA: Diagnosis not present

## 2020-02-26 DIAGNOSIS — E1042 Type 1 diabetes mellitus with diabetic polyneuropathy: Secondary | ICD-10-CM | POA: Diagnosis not present

## 2020-02-26 DIAGNOSIS — Z79891 Long term (current) use of opiate analgesic: Secondary | ICD-10-CM | POA: Diagnosis not present

## 2020-02-26 DIAGNOSIS — R251 Tremor, unspecified: Secondary | ICD-10-CM | POA: Diagnosis not present

## 2020-02-26 DIAGNOSIS — G894 Chronic pain syndrome: Secondary | ICD-10-CM | POA: Diagnosis not present

## 2020-03-24 DIAGNOSIS — G894 Chronic pain syndrome: Secondary | ICD-10-CM | POA: Diagnosis not present

## 2020-03-24 DIAGNOSIS — Z79891 Long term (current) use of opiate analgesic: Secondary | ICD-10-CM | POA: Diagnosis not present

## 2020-03-24 DIAGNOSIS — R251 Tremor, unspecified: Secondary | ICD-10-CM | POA: Diagnosis not present

## 2020-03-24 DIAGNOSIS — E1042 Type 1 diabetes mellitus with diabetic polyneuropathy: Secondary | ICD-10-CM | POA: Diagnosis not present

## 2020-04-13 DIAGNOSIS — E1059 Type 1 diabetes mellitus with other circulatory complications: Secondary | ICD-10-CM | POA: Diagnosis not present

## 2020-04-13 DIAGNOSIS — I2510 Atherosclerotic heart disease of native coronary artery without angina pectoris: Secondary | ICD-10-CM | POA: Diagnosis not present

## 2020-04-13 DIAGNOSIS — F329 Major depressive disorder, single episode, unspecified: Secondary | ICD-10-CM | POA: Diagnosis not present

## 2020-04-13 DIAGNOSIS — E785 Hyperlipidemia, unspecified: Secondary | ICD-10-CM | POA: Diagnosis not present

## 2020-04-13 DIAGNOSIS — G35 Multiple sclerosis: Secondary | ICD-10-CM | POA: Diagnosis not present

## 2020-04-13 DIAGNOSIS — Z794 Long term (current) use of insulin: Secondary | ICD-10-CM | POA: Diagnosis not present

## 2020-04-13 DIAGNOSIS — E859 Amyloidosis, unspecified: Secondary | ICD-10-CM | POA: Diagnosis not present

## 2020-04-13 DIAGNOSIS — G894 Chronic pain syndrome: Secondary | ICD-10-CM | POA: Diagnosis not present

## 2020-04-13 DIAGNOSIS — E1029 Type 1 diabetes mellitus with other diabetic kidney complication: Secondary | ICD-10-CM | POA: Diagnosis not present

## 2020-04-13 DIAGNOSIS — E1040 Type 1 diabetes mellitus with diabetic neuropathy, unspecified: Secondary | ICD-10-CM | POA: Diagnosis not present

## 2020-04-13 DIAGNOSIS — G629 Polyneuropathy, unspecified: Secondary | ICD-10-CM | POA: Diagnosis not present

## 2020-04-13 DIAGNOSIS — I129 Hypertensive chronic kidney disease with stage 1 through stage 4 chronic kidney disease, or unspecified chronic kidney disease: Secondary | ICD-10-CM | POA: Diagnosis not present

## 2020-04-21 ENCOUNTER — Ambulatory Visit: Payer: Medicare Other | Admitting: Podiatry

## 2020-04-21 DIAGNOSIS — Z79891 Long term (current) use of opiate analgesic: Secondary | ICD-10-CM | POA: Diagnosis not present

## 2020-04-21 DIAGNOSIS — E1042 Type 1 diabetes mellitus with diabetic polyneuropathy: Secondary | ICD-10-CM | POA: Diagnosis not present

## 2020-04-21 DIAGNOSIS — R251 Tremor, unspecified: Secondary | ICD-10-CM | POA: Diagnosis not present

## 2020-04-21 DIAGNOSIS — G894 Chronic pain syndrome: Secondary | ICD-10-CM | POA: Diagnosis not present

## 2020-04-28 ENCOUNTER — Other Ambulatory Visit: Payer: Self-pay

## 2020-04-28 ENCOUNTER — Ambulatory Visit (INDEPENDENT_AMBULATORY_CARE_PROVIDER_SITE_OTHER): Payer: Medicare Other | Admitting: Podiatry

## 2020-04-28 ENCOUNTER — Encounter: Payer: Self-pay | Admitting: Podiatry

## 2020-04-28 DIAGNOSIS — B351 Tinea unguium: Secondary | ICD-10-CM | POA: Diagnosis not present

## 2020-04-28 DIAGNOSIS — E1142 Type 2 diabetes mellitus with diabetic polyneuropathy: Secondary | ICD-10-CM

## 2020-04-28 DIAGNOSIS — D689 Coagulation defect, unspecified: Secondary | ICD-10-CM

## 2020-04-28 NOTE — Progress Notes (Signed)
This patient returns to my office for at risk foot care.  This patient requires this care by a professional since this patient will be at risk due to having chronic kidney disease and diabetes.This patient  is unable to cut nails himself since the patient cannot reach his nails.These nails are painful walking and wearing shoes.  This patient presents for at risk foot care today in a wheelchair. He presents to the office with his wife..  General Appearance  Alert, conversant and in no acute stress.  Vascular  Dorsalis pedis and posterior tibial  pulses are not  palpable  bilaterally.  Capillary return is diminished   bilaterally. Temperature is within normal limits  bilaterally.  Neurologic  Senn-Weinstein monofilament wire test absent   bilaterally. Muscle power within normal limits bilaterally.  Nails Thick disfigured discolored nails with subungual debris  from hallux to fifth toes bilaterally. No evidence of bacterial infection or drainage bilaterally.  Orthopedic  No limitations of motion  feet .  No crepitus or effusions noted.  No bony pathology or digital deformities noted.  Skin  normotropic skin with no porokeratosis noted bilaterally.  No signs of infections or ulcers noted.     Onychomycosis  Pain in right toes  Pain in left toes  Consent was obtained for treatment procedures.   Mechanical debridement of nails 1-5  bilaterally performed with a nail nipper.  Filed with dremel without incident.    Return office visit   4 months                  Told patient to return for periodic foot care and evaluation due to potential at risk complications.   Gardiner Barefoot DPM

## 2020-05-20 DIAGNOSIS — R251 Tremor, unspecified: Secondary | ICD-10-CM | POA: Diagnosis not present

## 2020-05-20 DIAGNOSIS — E1042 Type 1 diabetes mellitus with diabetic polyneuropathy: Secondary | ICD-10-CM | POA: Diagnosis not present

## 2020-05-20 DIAGNOSIS — Z79891 Long term (current) use of opiate analgesic: Secondary | ICD-10-CM | POA: Diagnosis not present

## 2020-05-20 DIAGNOSIS — G894 Chronic pain syndrome: Secondary | ICD-10-CM | POA: Diagnosis not present

## 2020-06-17 DIAGNOSIS — Z79891 Long term (current) use of opiate analgesic: Secondary | ICD-10-CM | POA: Diagnosis not present

## 2020-06-17 DIAGNOSIS — R251 Tremor, unspecified: Secondary | ICD-10-CM | POA: Diagnosis not present

## 2020-06-17 DIAGNOSIS — G894 Chronic pain syndrome: Secondary | ICD-10-CM | POA: Diagnosis not present

## 2020-06-17 DIAGNOSIS — E1042 Type 1 diabetes mellitus with diabetic polyneuropathy: Secondary | ICD-10-CM | POA: Diagnosis not present

## 2020-07-15 DIAGNOSIS — E1042 Type 1 diabetes mellitus with diabetic polyneuropathy: Secondary | ICD-10-CM | POA: Diagnosis not present

## 2020-07-15 DIAGNOSIS — Z79891 Long term (current) use of opiate analgesic: Secondary | ICD-10-CM | POA: Diagnosis not present

## 2020-07-15 DIAGNOSIS — G894 Chronic pain syndrome: Secondary | ICD-10-CM | POA: Diagnosis not present

## 2020-07-15 DIAGNOSIS — R251 Tremor, unspecified: Secondary | ICD-10-CM | POA: Diagnosis not present

## 2020-07-27 DIAGNOSIS — I129 Hypertensive chronic kidney disease with stage 1 through stage 4 chronic kidney disease, or unspecified chronic kidney disease: Secondary | ICD-10-CM | POA: Diagnosis not present

## 2020-07-27 DIAGNOSIS — E1059 Type 1 diabetes mellitus with other circulatory complications: Secondary | ICD-10-CM | POA: Diagnosis not present

## 2020-07-27 DIAGNOSIS — Z125 Encounter for screening for malignant neoplasm of prostate: Secondary | ICD-10-CM | POA: Diagnosis not present

## 2020-07-27 DIAGNOSIS — E785 Hyperlipidemia, unspecified: Secondary | ICD-10-CM | POA: Diagnosis not present

## 2020-07-27 DIAGNOSIS — I10 Essential (primary) hypertension: Secondary | ICD-10-CM | POA: Diagnosis not present

## 2020-07-27 DIAGNOSIS — G35 Multiple sclerosis: Secondary | ICD-10-CM | POA: Diagnosis not present

## 2020-07-27 DIAGNOSIS — G629 Polyneuropathy, unspecified: Secondary | ICD-10-CM | POA: Diagnosis not present

## 2020-07-27 DIAGNOSIS — B372 Candidiasis of skin and nail: Secondary | ICD-10-CM | POA: Diagnosis not present

## 2020-07-27 DIAGNOSIS — Z0000 Encounter for general adult medical examination without abnormal findings: Secondary | ICD-10-CM | POA: Diagnosis not present

## 2020-07-27 DIAGNOSIS — E1029 Type 1 diabetes mellitus with other diabetic kidney complication: Secondary | ICD-10-CM | POA: Diagnosis not present

## 2020-07-27 DIAGNOSIS — T63301A Toxic effect of unspecified spider venom, accidental (unintentional), initial encounter: Secondary | ICD-10-CM | POA: Diagnosis not present

## 2020-07-27 DIAGNOSIS — I69959 Hemiplegia and hemiparesis following unspecified cerebrovascular disease affecting unspecified side: Secondary | ICD-10-CM | POA: Diagnosis not present

## 2020-07-27 DIAGNOSIS — E1040 Type 1 diabetes mellitus with diabetic neuropathy, unspecified: Secondary | ICD-10-CM | POA: Diagnosis not present

## 2020-07-27 DIAGNOSIS — Z794 Long term (current) use of insulin: Secondary | ICD-10-CM | POA: Diagnosis not present

## 2020-07-29 ENCOUNTER — Ambulatory Visit (INDEPENDENT_AMBULATORY_CARE_PROVIDER_SITE_OTHER): Payer: Medicare Other | Admitting: Podiatry

## 2020-07-29 ENCOUNTER — Other Ambulatory Visit: Payer: Self-pay

## 2020-07-29 ENCOUNTER — Encounter: Payer: Self-pay | Admitting: Podiatry

## 2020-07-29 ENCOUNTER — Ambulatory Visit: Payer: Medicare Other | Admitting: Podiatry

## 2020-07-29 DIAGNOSIS — B351 Tinea unguium: Secondary | ICD-10-CM

## 2020-07-29 DIAGNOSIS — D689 Coagulation defect, unspecified: Secondary | ICD-10-CM

## 2020-07-29 DIAGNOSIS — E1142 Type 2 diabetes mellitus with diabetic polyneuropathy: Secondary | ICD-10-CM

## 2020-07-29 NOTE — Progress Notes (Signed)
This patient returns to my office for at risk foot care.  This patient requires this care by a professional since this patient will be at risk due to having chronic kidney disease and diabetes.This patient  is unable to cut nails himself since the patient cannot reach his nails.These nails are painful walking and wearing shoes.  This patient presents for at risk foot care today in a wheelchair. He presents to the office with his wife..  General Appearance  Alert, conversant and in no acute stress.  Vascular  Dorsalis pedis and posterior tibial  pulses are not  palpable  bilaterally.  Capillary return is diminished   bilaterally. Temperature is within normal limits  bilaterally.  Neurologic  Senn-Weinstein monofilament wire test absent   bilaterally. Muscle power within normal limits bilaterally.  Nails Thick disfigured discolored nails with subungual debris  from hallux to fifth toes bilaterally. No evidence of bacterial infection or drainage bilaterally.  Orthopedic  No limitations of motion  feet .  No crepitus or effusions noted.  No bony pathology or digital deformities noted.  Skin  normotropic skin with no porokeratosis noted bilaterally.  No signs of infections or ulcers noted.     Onychomycosis  Pain in right toes  Pain in left toes  Consent was obtained for treatment procedures.   Mechanical debridement of nails 1-5  bilaterally performed with a nail nipper.  Filed with dremel without incident.    Return office visit   4 months                  Told patient to return for periodic foot care and evaluation due to potential at risk complications.   Gardiner Barefoot DPM

## 2020-08-19 DIAGNOSIS — R251 Tremor, unspecified: Secondary | ICD-10-CM | POA: Diagnosis not present

## 2020-08-19 DIAGNOSIS — G894 Chronic pain syndrome: Secondary | ICD-10-CM | POA: Diagnosis not present

## 2020-08-19 DIAGNOSIS — E1042 Type 1 diabetes mellitus with diabetic polyneuropathy: Secondary | ICD-10-CM | POA: Diagnosis not present

## 2020-08-19 DIAGNOSIS — Z79891 Long term (current) use of opiate analgesic: Secondary | ICD-10-CM | POA: Diagnosis not present

## 2020-09-16 DIAGNOSIS — M79672 Pain in left foot: Secondary | ICD-10-CM | POA: Diagnosis not present

## 2020-09-16 DIAGNOSIS — Z79891 Long term (current) use of opiate analgesic: Secondary | ICD-10-CM | POA: Diagnosis not present

## 2020-09-16 DIAGNOSIS — E1042 Type 1 diabetes mellitus with diabetic polyneuropathy: Secondary | ICD-10-CM | POA: Diagnosis not present

## 2020-09-16 DIAGNOSIS — M79671 Pain in right foot: Secondary | ICD-10-CM | POA: Diagnosis not present

## 2020-10-21 DIAGNOSIS — M79672 Pain in left foot: Secondary | ICD-10-CM | POA: Diagnosis not present

## 2020-10-21 DIAGNOSIS — E1042 Type 1 diabetes mellitus with diabetic polyneuropathy: Secondary | ICD-10-CM | POA: Diagnosis not present

## 2020-10-21 DIAGNOSIS — M79671 Pain in right foot: Secondary | ICD-10-CM | POA: Diagnosis not present

## 2020-10-21 DIAGNOSIS — Z79891 Long term (current) use of opiate analgesic: Secondary | ICD-10-CM | POA: Diagnosis not present

## 2020-11-17 DIAGNOSIS — E1042 Type 1 diabetes mellitus with diabetic polyneuropathy: Secondary | ICD-10-CM | POA: Diagnosis not present

## 2020-11-17 DIAGNOSIS — M79671 Pain in right foot: Secondary | ICD-10-CM | POA: Diagnosis not present

## 2020-11-17 DIAGNOSIS — Z79891 Long term (current) use of opiate analgesic: Secondary | ICD-10-CM | POA: Diagnosis not present

## 2020-11-17 DIAGNOSIS — M79672 Pain in left foot: Secondary | ICD-10-CM | POA: Diagnosis not present

## 2020-11-18 DIAGNOSIS — Z79891 Long term (current) use of opiate analgesic: Secondary | ICD-10-CM | POA: Diagnosis not present

## 2020-11-18 DIAGNOSIS — G894 Chronic pain syndrome: Secondary | ICD-10-CM | POA: Diagnosis not present

## 2020-11-30 DIAGNOSIS — Z23 Encounter for immunization: Secondary | ICD-10-CM | POA: Diagnosis not present

## 2020-11-30 DIAGNOSIS — E1059 Type 1 diabetes mellitus with other circulatory complications: Secondary | ICD-10-CM | POA: Diagnosis not present

## 2020-11-30 DIAGNOSIS — E538 Deficiency of other specified B group vitamins: Secondary | ICD-10-CM | POA: Diagnosis not present

## 2020-11-30 DIAGNOSIS — I129 Hypertensive chronic kidney disease with stage 1 through stage 4 chronic kidney disease, or unspecified chronic kidney disease: Secondary | ICD-10-CM | POA: Diagnosis not present

## 2020-11-30 DIAGNOSIS — Z794 Long term (current) use of insulin: Secondary | ICD-10-CM | POA: Diagnosis not present

## 2020-11-30 DIAGNOSIS — G35 Multiple sclerosis: Secondary | ICD-10-CM | POA: Diagnosis not present

## 2020-12-15 DIAGNOSIS — Z79891 Long term (current) use of opiate analgesic: Secondary | ICD-10-CM | POA: Diagnosis not present

## 2020-12-15 DIAGNOSIS — E1042 Type 1 diabetes mellitus with diabetic polyneuropathy: Secondary | ICD-10-CM | POA: Diagnosis not present

## 2020-12-15 DIAGNOSIS — M79671 Pain in right foot: Secondary | ICD-10-CM | POA: Diagnosis not present

## 2020-12-15 DIAGNOSIS — M79672 Pain in left foot: Secondary | ICD-10-CM | POA: Diagnosis not present

## 2021-01-13 DIAGNOSIS — M79671 Pain in right foot: Secondary | ICD-10-CM | POA: Diagnosis not present

## 2021-01-13 DIAGNOSIS — E1042 Type 1 diabetes mellitus with diabetic polyneuropathy: Secondary | ICD-10-CM | POA: Diagnosis not present

## 2021-01-13 DIAGNOSIS — M79672 Pain in left foot: Secondary | ICD-10-CM | POA: Diagnosis not present

## 2021-01-13 DIAGNOSIS — Z79891 Long term (current) use of opiate analgesic: Secondary | ICD-10-CM | POA: Diagnosis not present

## 2021-02-10 DIAGNOSIS — M79672 Pain in left foot: Secondary | ICD-10-CM | POA: Diagnosis not present

## 2021-02-10 DIAGNOSIS — G894 Chronic pain syndrome: Secondary | ICD-10-CM | POA: Diagnosis not present

## 2021-02-10 DIAGNOSIS — Z79891 Long term (current) use of opiate analgesic: Secondary | ICD-10-CM | POA: Diagnosis not present

## 2021-02-10 DIAGNOSIS — M79671 Pain in right foot: Secondary | ICD-10-CM | POA: Diagnosis not present

## 2021-02-10 DIAGNOSIS — E1042 Type 1 diabetes mellitus with diabetic polyneuropathy: Secondary | ICD-10-CM | POA: Diagnosis not present

## 2021-03-10 DIAGNOSIS — M79671 Pain in right foot: Secondary | ICD-10-CM | POA: Diagnosis not present

## 2021-03-10 DIAGNOSIS — E1042 Type 1 diabetes mellitus with diabetic polyneuropathy: Secondary | ICD-10-CM | POA: Diagnosis not present

## 2021-03-10 DIAGNOSIS — M79672 Pain in left foot: Secondary | ICD-10-CM | POA: Diagnosis not present

## 2021-03-10 DIAGNOSIS — Z79891 Long term (current) use of opiate analgesic: Secondary | ICD-10-CM | POA: Diagnosis not present

## 2021-04-05 DIAGNOSIS — Z794 Long term (current) use of insulin: Secondary | ICD-10-CM | POA: Diagnosis not present

## 2021-04-05 DIAGNOSIS — G629 Polyneuropathy, unspecified: Secondary | ICD-10-CM | POA: Diagnosis not present

## 2021-04-05 DIAGNOSIS — Z7409 Other reduced mobility: Secondary | ICD-10-CM | POA: Diagnosis not present

## 2021-04-05 DIAGNOSIS — G894 Chronic pain syndrome: Secondary | ICD-10-CM | POA: Diagnosis not present

## 2021-04-05 DIAGNOSIS — N182 Chronic kidney disease, stage 2 (mild): Secondary | ICD-10-CM | POA: Diagnosis not present

## 2021-04-05 DIAGNOSIS — E663 Overweight: Secondary | ICD-10-CM | POA: Diagnosis not present

## 2021-04-05 DIAGNOSIS — E1029 Type 1 diabetes mellitus with other diabetic kidney complication: Secondary | ICD-10-CM | POA: Diagnosis not present

## 2021-04-07 DIAGNOSIS — E1042 Type 1 diabetes mellitus with diabetic polyneuropathy: Secondary | ICD-10-CM | POA: Diagnosis not present

## 2021-04-07 DIAGNOSIS — Z79891 Long term (current) use of opiate analgesic: Secondary | ICD-10-CM | POA: Diagnosis not present

## 2021-04-07 DIAGNOSIS — M79671 Pain in right foot: Secondary | ICD-10-CM | POA: Diagnosis not present

## 2021-04-07 DIAGNOSIS — M79672 Pain in left foot: Secondary | ICD-10-CM | POA: Diagnosis not present

## 2021-04-29 DIAGNOSIS — H40013 Open angle with borderline findings, low risk, bilateral: Secondary | ICD-10-CM | POA: Diagnosis not present

## 2021-04-29 DIAGNOSIS — E119 Type 2 diabetes mellitus without complications: Secondary | ICD-10-CM | POA: Diagnosis not present

## 2021-04-29 DIAGNOSIS — H524 Presbyopia: Secondary | ICD-10-CM | POA: Diagnosis not present

## 2021-04-29 DIAGNOSIS — Z961 Presence of intraocular lens: Secondary | ICD-10-CM | POA: Diagnosis not present

## 2021-06-02 DIAGNOSIS — M79672 Pain in left foot: Secondary | ICD-10-CM | POA: Diagnosis not present

## 2021-06-02 DIAGNOSIS — E1042 Type 1 diabetes mellitus with diabetic polyneuropathy: Secondary | ICD-10-CM | POA: Diagnosis not present

## 2021-06-02 DIAGNOSIS — M79671 Pain in right foot: Secondary | ICD-10-CM | POA: Diagnosis not present

## 2021-06-02 DIAGNOSIS — Z79891 Long term (current) use of opiate analgesic: Secondary | ICD-10-CM | POA: Diagnosis not present

## 2021-06-08 ENCOUNTER — Ambulatory Visit (INDEPENDENT_AMBULATORY_CARE_PROVIDER_SITE_OTHER): Payer: Medicare Other | Admitting: Podiatry

## 2021-06-08 ENCOUNTER — Encounter: Payer: Self-pay | Admitting: Podiatry

## 2021-06-08 DIAGNOSIS — D689 Coagulation defect, unspecified: Secondary | ICD-10-CM | POA: Diagnosis not present

## 2021-06-08 DIAGNOSIS — E1142 Type 2 diabetes mellitus with diabetic polyneuropathy: Secondary | ICD-10-CM | POA: Diagnosis not present

## 2021-06-08 DIAGNOSIS — B351 Tinea unguium: Secondary | ICD-10-CM

## 2021-06-08 NOTE — Progress Notes (Signed)
This patient returns to my office for at risk foot care.  This patient requires this care by a professional since this patient will be at risk due to having chronic kidney disease and diabetes.This patient  is unable to cut nails himself since the patient cannot reach his nails.These nails are painful walking and wearing shoes.  This patient presents for at risk foot care today in a wheelchair. He presents to the office with his wife.  He is in a wheelchair..Patient has not been seen in over 10 months. ? ?General Appearance  Alert, conversant and in no acute stress. ? ?Vascular  Dorsalis pedis and posterior tibial  pulses are not  palpable  bilaterally.  Capillary return is diminished   bilaterally.Cold purple feet   bilaterally. ? ?Neurologic  Senn-Weinstein monofilament wire test absent   bilaterally. Muscle power within normal limits bilaterally. ? ?Nails Thick disfigured discolored nails with subungual debris  from hallux to fifth toes bilaterally. No evidence of bacterial infection or drainage bilaterally. ? ?Orthopedic  No limitations of motion  feet .  No crepitus or effusions noted.  No bony pathology or digital deformities noted. ? ?Skin  normotropic skin with no porokeratosis noted bilaterally.  No signs of infections or ulcers noted.  Necrotic tissue removed from second toe left exposing small ulcer over DIPJ.  ? ?Onychomycosis  Pain in right toes  Pain in left toes ? ?Consent was obtained for treatment procedures.   Mechanical debridement of nails 1-5  bilaterally performed with a nail nipper.  Filed with dremel without incident.  Told his wife to peroxide his open skin lesion. ? ? ?Return office visit   4 months                  Told patient to return for periodic foot care and evaluation due to potential at risk complications. ? ? ?Gardiner Barefoot DPM  ?

## 2021-07-07 DIAGNOSIS — G629 Polyneuropathy, unspecified: Secondary | ICD-10-CM | POA: Diagnosis not present

## 2021-07-07 DIAGNOSIS — N1831 Chronic kidney disease, stage 3a: Secondary | ICD-10-CM | POA: Diagnosis not present

## 2021-07-07 DIAGNOSIS — Z794 Long term (current) use of insulin: Secondary | ICD-10-CM | POA: Diagnosis not present

## 2021-07-07 DIAGNOSIS — E785 Hyperlipidemia, unspecified: Secondary | ICD-10-CM | POA: Diagnosis not present

## 2021-07-07 DIAGNOSIS — I2510 Atherosclerotic heart disease of native coronary artery without angina pectoris: Secondary | ICD-10-CM | POA: Diagnosis not present

## 2021-07-07 DIAGNOSIS — E663 Overweight: Secondary | ICD-10-CM | POA: Diagnosis not present

## 2021-07-07 DIAGNOSIS — E1040 Type 1 diabetes mellitus with diabetic neuropathy, unspecified: Secondary | ICD-10-CM | POA: Diagnosis not present

## 2021-07-07 DIAGNOSIS — G894 Chronic pain syndrome: Secondary | ICD-10-CM | POA: Diagnosis not present

## 2021-07-07 DIAGNOSIS — I129 Hypertensive chronic kidney disease with stage 1 through stage 4 chronic kidney disease, or unspecified chronic kidney disease: Secondary | ICD-10-CM | POA: Diagnosis not present

## 2021-07-07 DIAGNOSIS — G4700 Insomnia, unspecified: Secondary | ICD-10-CM | POA: Diagnosis not present

## 2021-07-07 DIAGNOSIS — E1029 Type 1 diabetes mellitus with other diabetic kidney complication: Secondary | ICD-10-CM | POA: Diagnosis not present

## 2021-07-07 DIAGNOSIS — Z7409 Other reduced mobility: Secondary | ICD-10-CM | POA: Diagnosis not present

## 2021-08-04 DIAGNOSIS — M79671 Pain in right foot: Secondary | ICD-10-CM | POA: Diagnosis not present

## 2021-08-04 DIAGNOSIS — M79672 Pain in left foot: Secondary | ICD-10-CM | POA: Diagnosis not present

## 2021-08-04 DIAGNOSIS — E1042 Type 1 diabetes mellitus with diabetic polyneuropathy: Secondary | ICD-10-CM | POA: Diagnosis not present

## 2021-08-04 DIAGNOSIS — Z79891 Long term (current) use of opiate analgesic: Secondary | ICD-10-CM | POA: Diagnosis not present

## 2021-09-29 DIAGNOSIS — M79672 Pain in left foot: Secondary | ICD-10-CM | POA: Diagnosis not present

## 2021-09-29 DIAGNOSIS — E1042 Type 1 diabetes mellitus with diabetic polyneuropathy: Secondary | ICD-10-CM | POA: Diagnosis not present

## 2021-09-29 DIAGNOSIS — M79671 Pain in right foot: Secondary | ICD-10-CM | POA: Diagnosis not present

## 2021-09-29 DIAGNOSIS — Z79891 Long term (current) use of opiate analgesic: Secondary | ICD-10-CM | POA: Diagnosis not present

## 2021-11-03 DIAGNOSIS — E1040 Type 1 diabetes mellitus with diabetic neuropathy, unspecified: Secondary | ICD-10-CM | POA: Diagnosis not present

## 2021-11-03 DIAGNOSIS — L89302 Pressure ulcer of unspecified buttock, stage 2: Secondary | ICD-10-CM | POA: Diagnosis not present

## 2021-11-03 DIAGNOSIS — D225 Melanocytic nevi of trunk: Secondary | ICD-10-CM | POA: Diagnosis not present

## 2021-11-03 DIAGNOSIS — G894 Chronic pain syndrome: Secondary | ICD-10-CM | POA: Diagnosis not present

## 2021-11-03 DIAGNOSIS — Z794 Long term (current) use of insulin: Secondary | ICD-10-CM | POA: Diagnosis not present

## 2021-11-03 DIAGNOSIS — E538 Deficiency of other specified B group vitamins: Secondary | ICD-10-CM | POA: Diagnosis not present

## 2021-11-03 DIAGNOSIS — G35 Multiple sclerosis: Secondary | ICD-10-CM | POA: Diagnosis not present

## 2021-11-03 DIAGNOSIS — Z0000 Encounter for general adult medical examination without abnormal findings: Secondary | ICD-10-CM | POA: Diagnosis not present

## 2021-11-03 DIAGNOSIS — I10 Essential (primary) hypertension: Secondary | ICD-10-CM | POA: Diagnosis not present

## 2021-11-03 DIAGNOSIS — E785 Hyperlipidemia, unspecified: Secondary | ICD-10-CM | POA: Diagnosis not present

## 2021-11-03 DIAGNOSIS — Z125 Encounter for screening for malignant neoplasm of prostate: Secondary | ICD-10-CM | POA: Diagnosis not present

## 2021-11-03 DIAGNOSIS — E1059 Type 1 diabetes mellitus with other circulatory complications: Secondary | ICD-10-CM | POA: Diagnosis not present

## 2021-11-24 DIAGNOSIS — M79672 Pain in left foot: Secondary | ICD-10-CM | POA: Diagnosis not present

## 2021-11-24 DIAGNOSIS — L820 Inflamed seborrheic keratosis: Secondary | ICD-10-CM | POA: Diagnosis not present

## 2021-11-24 DIAGNOSIS — E1042 Type 1 diabetes mellitus with diabetic polyneuropathy: Secondary | ICD-10-CM | POA: Diagnosis not present

## 2021-11-24 DIAGNOSIS — L231 Allergic contact dermatitis due to adhesives: Secondary | ICD-10-CM | POA: Diagnosis not present

## 2021-11-24 DIAGNOSIS — Z79891 Long term (current) use of opiate analgesic: Secondary | ICD-10-CM | POA: Diagnosis not present

## 2021-11-24 DIAGNOSIS — M79671 Pain in right foot: Secondary | ICD-10-CM | POA: Diagnosis not present

## 2021-12-15 ENCOUNTER — Encounter: Payer: Self-pay | Admitting: Podiatry

## 2021-12-15 ENCOUNTER — Ambulatory Visit (INDEPENDENT_AMBULATORY_CARE_PROVIDER_SITE_OTHER): Payer: Medicare Other | Admitting: Podiatry

## 2021-12-15 DIAGNOSIS — B351 Tinea unguium: Secondary | ICD-10-CM | POA: Diagnosis not present

## 2021-12-15 DIAGNOSIS — E1142 Type 2 diabetes mellitus with diabetic polyneuropathy: Secondary | ICD-10-CM | POA: Diagnosis not present

## 2021-12-15 DIAGNOSIS — G35 Multiple sclerosis: Secondary | ICD-10-CM | POA: Diagnosis not present

## 2021-12-15 DIAGNOSIS — N182 Chronic kidney disease, stage 2 (mild): Secondary | ICD-10-CM | POA: Diagnosis not present

## 2021-12-15 DIAGNOSIS — D689 Coagulation defect, unspecified: Secondary | ICD-10-CM | POA: Diagnosis not present

## 2021-12-15 NOTE — Progress Notes (Signed)
This patient returns to my office for at risk foot care.  This patient requires this care by a professional since this patient will be at risk due to having chronic kidney disease and diabetes.This patient  is unable to cut nails himself since the patient cannot reach his nails.These nails are painful walking and wearing shoes.  This patient presents for at risk foot care today in a wheelchair. He presents to the office with his wife.  He is in a wheelchair..Patient has not been seen in over 10 months.  General Appearance  Alert, conversant and in no acute stress.  Vascular  Dorsalis pedis and posterior tibial  pulses are not  palpable  bilaterally.  Capillary return is diminished   bilaterally.Cold purple feet   bilaterally.  Neurologic  Senn-Weinstein monofilament wire test absent   bilaterally. Muscle power within normal limits bilaterally.  Nails Thick disfigured discolored nails with subungual debris  from hallux to fifth toes bilaterally. No evidence of bacterial infection or drainage bilaterally.  Orthopedic  No limitations of motion  feet .  No crepitus or effusions noted.  No bony pathology or digital deformities noted.  Skin  normotropic skin with no porokeratosis noted bilaterally.  No signs of infections or ulcers noted.    Onychomycosis  Pain in right toes  Pain in left toes  Consent was obtained for treatment procedures.   Mechanical debridement of nails 1-5  bilaterally performed with a nail nipper.  Filed with dremel without incident.     Return office visit   3  months                  Told patient to return for periodic foot care and evaluation due to potential at risk complications.   Gardiner Barefoot DPM

## 2021-12-22 DIAGNOSIS — Z7409 Other reduced mobility: Secondary | ICD-10-CM | POA: Diagnosis not present

## 2021-12-22 DIAGNOSIS — L89153 Pressure ulcer of sacral region, stage 3: Secondary | ICD-10-CM | POA: Diagnosis not present

## 2021-12-22 DIAGNOSIS — G629 Polyneuropathy, unspecified: Secondary | ICD-10-CM | POA: Diagnosis not present

## 2021-12-22 DIAGNOSIS — G35 Multiple sclerosis: Secondary | ICD-10-CM | POA: Diagnosis not present

## 2021-12-27 DIAGNOSIS — E119 Type 2 diabetes mellitus without complications: Secondary | ICD-10-CM | POA: Diagnosis not present

## 2021-12-27 DIAGNOSIS — Z794 Long term (current) use of insulin: Secondary | ICD-10-CM | POA: Diagnosis not present

## 2021-12-27 DIAGNOSIS — G35 Multiple sclerosis: Secondary | ICD-10-CM | POA: Diagnosis not present

## 2021-12-27 DIAGNOSIS — G629 Polyneuropathy, unspecified: Secondary | ICD-10-CM | POA: Diagnosis not present

## 2021-12-27 DIAGNOSIS — L89153 Pressure ulcer of sacral region, stage 3: Secondary | ICD-10-CM | POA: Diagnosis not present

## 2021-12-27 DIAGNOSIS — Z7982 Long term (current) use of aspirin: Secondary | ICD-10-CM | POA: Diagnosis not present

## 2021-12-27 DIAGNOSIS — Z7409 Other reduced mobility: Secondary | ICD-10-CM | POA: Diagnosis not present

## 2021-12-27 DIAGNOSIS — Z7902 Long term (current) use of antithrombotics/antiplatelets: Secondary | ICD-10-CM | POA: Diagnosis not present

## 2022-01-03 DIAGNOSIS — E119 Type 2 diabetes mellitus without complications: Secondary | ICD-10-CM | POA: Diagnosis not present

## 2022-01-03 DIAGNOSIS — G629 Polyneuropathy, unspecified: Secondary | ICD-10-CM | POA: Diagnosis not present

## 2022-01-03 DIAGNOSIS — Z794 Long term (current) use of insulin: Secondary | ICD-10-CM | POA: Diagnosis not present

## 2022-01-03 DIAGNOSIS — G35 Multiple sclerosis: Secondary | ICD-10-CM | POA: Diagnosis not present

## 2022-01-03 DIAGNOSIS — Z7409 Other reduced mobility: Secondary | ICD-10-CM | POA: Diagnosis not present

## 2022-01-03 DIAGNOSIS — L89153 Pressure ulcer of sacral region, stage 3: Secondary | ICD-10-CM | POA: Diagnosis not present

## 2022-01-06 DIAGNOSIS — G629 Polyneuropathy, unspecified: Secondary | ICD-10-CM | POA: Diagnosis not present

## 2022-01-06 DIAGNOSIS — L89153 Pressure ulcer of sacral region, stage 3: Secondary | ICD-10-CM | POA: Diagnosis not present

## 2022-01-06 DIAGNOSIS — E119 Type 2 diabetes mellitus without complications: Secondary | ICD-10-CM | POA: Diagnosis not present

## 2022-01-06 DIAGNOSIS — Z7409 Other reduced mobility: Secondary | ICD-10-CM | POA: Diagnosis not present

## 2022-01-06 DIAGNOSIS — G35 Multiple sclerosis: Secondary | ICD-10-CM | POA: Diagnosis not present

## 2022-01-06 DIAGNOSIS — Z794 Long term (current) use of insulin: Secondary | ICD-10-CM | POA: Diagnosis not present

## 2022-01-11 DIAGNOSIS — Z7409 Other reduced mobility: Secondary | ICD-10-CM | POA: Diagnosis not present

## 2022-01-11 DIAGNOSIS — Z794 Long term (current) use of insulin: Secondary | ICD-10-CM | POA: Diagnosis not present

## 2022-01-11 DIAGNOSIS — L89153 Pressure ulcer of sacral region, stage 3: Secondary | ICD-10-CM | POA: Diagnosis not present

## 2022-01-11 DIAGNOSIS — G629 Polyneuropathy, unspecified: Secondary | ICD-10-CM | POA: Diagnosis not present

## 2022-01-11 DIAGNOSIS — G35 Multiple sclerosis: Secondary | ICD-10-CM | POA: Diagnosis not present

## 2022-01-11 DIAGNOSIS — E119 Type 2 diabetes mellitus without complications: Secondary | ICD-10-CM | POA: Diagnosis not present

## 2022-01-13 ENCOUNTER — Other Ambulatory Visit (HOSPITAL_BASED_OUTPATIENT_CLINIC_OR_DEPARTMENT_OTHER): Payer: Self-pay

## 2022-01-13 DIAGNOSIS — G629 Polyneuropathy, unspecified: Secondary | ICD-10-CM | POA: Diagnosis not present

## 2022-01-13 DIAGNOSIS — L89153 Pressure ulcer of sacral region, stage 3: Secondary | ICD-10-CM | POA: Diagnosis not present

## 2022-01-13 DIAGNOSIS — G35 Multiple sclerosis: Secondary | ICD-10-CM | POA: Diagnosis not present

## 2022-01-13 DIAGNOSIS — Z7409 Other reduced mobility: Secondary | ICD-10-CM | POA: Diagnosis not present

## 2022-01-13 DIAGNOSIS — Z794 Long term (current) use of insulin: Secondary | ICD-10-CM | POA: Diagnosis not present

## 2022-01-13 DIAGNOSIS — E119 Type 2 diabetes mellitus without complications: Secondary | ICD-10-CM | POA: Diagnosis not present

## 2022-01-18 DIAGNOSIS — E785 Hyperlipidemia, unspecified: Secondary | ICD-10-CM | POA: Diagnosis not present

## 2022-01-18 DIAGNOSIS — Z794 Long term (current) use of insulin: Secondary | ICD-10-CM | POA: Diagnosis not present

## 2022-01-18 DIAGNOSIS — F32A Depression, unspecified: Secondary | ICD-10-CM | POA: Diagnosis not present

## 2022-01-18 DIAGNOSIS — L89153 Pressure ulcer of sacral region, stage 3: Secondary | ICD-10-CM | POA: Diagnosis not present

## 2022-01-18 DIAGNOSIS — I2510 Atherosclerotic heart disease of native coronary artery without angina pectoris: Secondary | ICD-10-CM | POA: Diagnosis not present

## 2022-01-18 DIAGNOSIS — R1310 Dysphagia, unspecified: Secondary | ICD-10-CM | POA: Diagnosis not present

## 2022-01-18 DIAGNOSIS — G35 Multiple sclerosis: Secondary | ICD-10-CM | POA: Diagnosis not present

## 2022-01-18 DIAGNOSIS — E1142 Type 2 diabetes mellitus with diabetic polyneuropathy: Secondary | ICD-10-CM | POA: Diagnosis not present

## 2022-01-18 DIAGNOSIS — N400 Enlarged prostate without lower urinary tract symptoms: Secondary | ICD-10-CM | POA: Diagnosis not present

## 2022-01-18 DIAGNOSIS — I69318 Other symptoms and signs involving cognitive functions following cerebral infarction: Secondary | ICD-10-CM | POA: Diagnosis not present

## 2022-01-18 DIAGNOSIS — I10 Essential (primary) hypertension: Secondary | ICD-10-CM | POA: Diagnosis not present

## 2022-01-19 DIAGNOSIS — M79672 Pain in left foot: Secondary | ICD-10-CM | POA: Diagnosis not present

## 2022-01-19 DIAGNOSIS — E1042 Type 1 diabetes mellitus with diabetic polyneuropathy: Secondary | ICD-10-CM | POA: Diagnosis not present

## 2022-01-19 DIAGNOSIS — Z79891 Long term (current) use of opiate analgesic: Secondary | ICD-10-CM | POA: Diagnosis not present

## 2022-01-19 DIAGNOSIS — M79671 Pain in right foot: Secondary | ICD-10-CM | POA: Diagnosis not present

## 2022-01-20 DIAGNOSIS — I10 Essential (primary) hypertension: Secondary | ICD-10-CM | POA: Diagnosis not present

## 2022-01-20 DIAGNOSIS — I69318 Other symptoms and signs involving cognitive functions following cerebral infarction: Secondary | ICD-10-CM | POA: Diagnosis not present

## 2022-01-20 DIAGNOSIS — I2510 Atherosclerotic heart disease of native coronary artery without angina pectoris: Secondary | ICD-10-CM | POA: Diagnosis not present

## 2022-01-20 DIAGNOSIS — R1310 Dysphagia, unspecified: Secondary | ICD-10-CM | POA: Diagnosis not present

## 2022-01-20 DIAGNOSIS — G35 Multiple sclerosis: Secondary | ICD-10-CM | POA: Diagnosis not present

## 2022-01-20 DIAGNOSIS — L89153 Pressure ulcer of sacral region, stage 3: Secondary | ICD-10-CM | POA: Diagnosis not present

## 2022-01-26 DIAGNOSIS — L89153 Pressure ulcer of sacral region, stage 3: Secondary | ICD-10-CM | POA: Diagnosis not present

## 2022-01-26 DIAGNOSIS — I10 Essential (primary) hypertension: Secondary | ICD-10-CM | POA: Diagnosis not present

## 2022-01-26 DIAGNOSIS — R1310 Dysphagia, unspecified: Secondary | ICD-10-CM | POA: Diagnosis not present

## 2022-01-26 DIAGNOSIS — G35 Multiple sclerosis: Secondary | ICD-10-CM | POA: Diagnosis not present

## 2022-01-26 DIAGNOSIS — I69318 Other symptoms and signs involving cognitive functions following cerebral infarction: Secondary | ICD-10-CM | POA: Diagnosis not present

## 2022-01-26 DIAGNOSIS — I2510 Atherosclerotic heart disease of native coronary artery without angina pectoris: Secondary | ICD-10-CM | POA: Diagnosis not present

## 2022-02-02 DIAGNOSIS — L89153 Pressure ulcer of sacral region, stage 3: Secondary | ICD-10-CM | POA: Diagnosis not present

## 2022-02-02 DIAGNOSIS — I10 Essential (primary) hypertension: Secondary | ICD-10-CM | POA: Diagnosis not present

## 2022-02-02 DIAGNOSIS — I69318 Other symptoms and signs involving cognitive functions following cerebral infarction: Secondary | ICD-10-CM | POA: Diagnosis not present

## 2022-02-02 DIAGNOSIS — I2510 Atherosclerotic heart disease of native coronary artery without angina pectoris: Secondary | ICD-10-CM | POA: Diagnosis not present

## 2022-02-02 DIAGNOSIS — G35 Multiple sclerosis: Secondary | ICD-10-CM | POA: Diagnosis not present

## 2022-02-02 DIAGNOSIS — R1310 Dysphagia, unspecified: Secondary | ICD-10-CM | POA: Diagnosis not present

## 2022-02-09 DIAGNOSIS — I10 Essential (primary) hypertension: Secondary | ICD-10-CM | POA: Diagnosis not present

## 2022-02-09 DIAGNOSIS — E1029 Type 1 diabetes mellitus with other diabetic kidney complication: Secondary | ICD-10-CM | POA: Diagnosis not present

## 2022-02-09 DIAGNOSIS — Z794 Long term (current) use of insulin: Secondary | ICD-10-CM | POA: Diagnosis not present

## 2022-02-09 DIAGNOSIS — N1831 Chronic kidney disease, stage 3a: Secondary | ICD-10-CM | POA: Diagnosis not present

## 2022-02-09 DIAGNOSIS — G35 Multiple sclerosis: Secondary | ICD-10-CM | POA: Diagnosis not present

## 2022-02-09 DIAGNOSIS — I129 Hypertensive chronic kidney disease with stage 1 through stage 4 chronic kidney disease, or unspecified chronic kidney disease: Secondary | ICD-10-CM | POA: Diagnosis not present

## 2022-02-09 DIAGNOSIS — G894 Chronic pain syndrome: Secondary | ICD-10-CM | POA: Diagnosis not present

## 2022-02-11 DIAGNOSIS — G894 Chronic pain syndrome: Secondary | ICD-10-CM | POA: Diagnosis not present

## 2022-02-11 DIAGNOSIS — Z79891 Long term (current) use of opiate analgesic: Secondary | ICD-10-CM | POA: Diagnosis not present

## 2022-02-17 ENCOUNTER — Other Ambulatory Visit (HOSPITAL_BASED_OUTPATIENT_CLINIC_OR_DEPARTMENT_OTHER): Payer: Self-pay

## 2022-02-17 DIAGNOSIS — E1042 Type 1 diabetes mellitus with diabetic polyneuropathy: Secondary | ICD-10-CM | POA: Diagnosis not present

## 2022-02-17 DIAGNOSIS — Z79891 Long term (current) use of opiate analgesic: Secondary | ICD-10-CM | POA: Diagnosis not present

## 2022-02-17 DIAGNOSIS — M79672 Pain in left foot: Secondary | ICD-10-CM | POA: Diagnosis not present

## 2022-02-17 DIAGNOSIS — M79671 Pain in right foot: Secondary | ICD-10-CM | POA: Diagnosis not present

## 2022-02-17 MED ORDER — NUCYNTA ER 200 MG PO TB12
200.0000 mg | ORAL_TABLET | Freq: Two times a day (BID) | ORAL | 0 refills | Status: DC
  Filled 2022-03-04: qty 60, 30d supply, fill #0

## 2022-02-17 MED ORDER — OXYCODONE HCL 15 MG PO TABS
15.0000 mg | ORAL_TABLET | Freq: Four times a day (QID) | ORAL | 0 refills | Status: DC | PRN
  Filled 2022-03-04 (×2): qty 120, 30d supply, fill #0

## 2022-02-17 MED ORDER — NUCYNTA ER 200 MG PO TB12
200.0000 mg | ORAL_TABLET | Freq: Two times a day (BID) | ORAL | 0 refills | Status: DC
  Filled 2022-04-04: qty 60, 30d supply, fill #0

## 2022-02-17 MED ORDER — OXYCODONE HCL 15 MG PO TABS
15.0000 mg | ORAL_TABLET | Freq: Four times a day (QID) | ORAL | 0 refills | Status: DC | PRN
  Filled 2022-04-04: qty 120, 30d supply, fill #0

## 2022-02-24 ENCOUNTER — Other Ambulatory Visit (HOSPITAL_BASED_OUTPATIENT_CLINIC_OR_DEPARTMENT_OTHER): Payer: Self-pay

## 2022-02-24 DIAGNOSIS — I69354 Hemiplegia and hemiparesis following cerebral infarction affecting left non-dominant side: Secondary | ICD-10-CM | POA: Diagnosis not present

## 2022-02-24 DIAGNOSIS — E538 Deficiency of other specified B group vitamins: Secondary | ICD-10-CM | POA: Diagnosis not present

## 2022-02-24 DIAGNOSIS — I2510 Atherosclerotic heart disease of native coronary artery without angina pectoris: Secondary | ICD-10-CM | POA: Diagnosis not present

## 2022-02-24 DIAGNOSIS — E1042 Type 1 diabetes mellitus with diabetic polyneuropathy: Secondary | ICD-10-CM | POA: Diagnosis not present

## 2022-02-24 DIAGNOSIS — G894 Chronic pain syndrome: Secondary | ICD-10-CM | POA: Diagnosis not present

## 2022-02-24 DIAGNOSIS — Z7902 Long term (current) use of antithrombotics/antiplatelets: Secondary | ICD-10-CM | POA: Diagnosis not present

## 2022-02-24 DIAGNOSIS — E1029 Type 1 diabetes mellitus with other diabetic kidney complication: Secondary | ICD-10-CM | POA: Diagnosis not present

## 2022-02-24 DIAGNOSIS — E1044 Type 1 diabetes mellitus with diabetic amyotrophy: Secondary | ICD-10-CM | POA: Diagnosis not present

## 2022-02-24 DIAGNOSIS — L89153 Pressure ulcer of sacral region, stage 3: Secondary | ICD-10-CM | POA: Diagnosis not present

## 2022-02-24 DIAGNOSIS — R809 Proteinuria, unspecified: Secondary | ICD-10-CM | POA: Diagnosis not present

## 2022-02-24 DIAGNOSIS — Z556 Problems related to health literacy: Secondary | ICD-10-CM | POA: Diagnosis not present

## 2022-02-24 DIAGNOSIS — Z9181 History of falling: Secondary | ICD-10-CM | POA: Diagnosis not present

## 2022-02-24 DIAGNOSIS — G35 Multiple sclerosis: Secondary | ICD-10-CM | POA: Diagnosis not present

## 2022-02-24 DIAGNOSIS — I10 Essential (primary) hypertension: Secondary | ICD-10-CM | POA: Diagnosis not present

## 2022-03-04 ENCOUNTER — Other Ambulatory Visit: Payer: Self-pay

## 2022-03-04 ENCOUNTER — Other Ambulatory Visit (HOSPITAL_BASED_OUTPATIENT_CLINIC_OR_DEPARTMENT_OTHER): Payer: Self-pay

## 2022-03-04 ENCOUNTER — Other Ambulatory Visit (HOSPITAL_COMMUNITY): Payer: Self-pay

## 2022-03-08 ENCOUNTER — Other Ambulatory Visit (HOSPITAL_BASED_OUTPATIENT_CLINIC_OR_DEPARTMENT_OTHER): Payer: Self-pay

## 2022-03-16 DIAGNOSIS — G35 Multiple sclerosis: Secondary | ICD-10-CM | POA: Diagnosis not present

## 2022-03-16 DIAGNOSIS — E1042 Type 1 diabetes mellitus with diabetic polyneuropathy: Secondary | ICD-10-CM | POA: Diagnosis not present

## 2022-03-16 DIAGNOSIS — L89153 Pressure ulcer of sacral region, stage 3: Secondary | ICD-10-CM | POA: Diagnosis not present

## 2022-03-16 DIAGNOSIS — I69354 Hemiplegia and hemiparesis following cerebral infarction affecting left non-dominant side: Secondary | ICD-10-CM | POA: Diagnosis not present

## 2022-03-16 DIAGNOSIS — E1029 Type 1 diabetes mellitus with other diabetic kidney complication: Secondary | ICD-10-CM | POA: Diagnosis not present

## 2022-03-16 DIAGNOSIS — R809 Proteinuria, unspecified: Secondary | ICD-10-CM | POA: Diagnosis not present

## 2022-03-17 DIAGNOSIS — G35 Multiple sclerosis: Secondary | ICD-10-CM | POA: Diagnosis not present

## 2022-03-17 DIAGNOSIS — L89153 Pressure ulcer of sacral region, stage 3: Secondary | ICD-10-CM | POA: Diagnosis not present

## 2022-03-17 DIAGNOSIS — E1042 Type 1 diabetes mellitus with diabetic polyneuropathy: Secondary | ICD-10-CM | POA: Diagnosis not present

## 2022-03-17 DIAGNOSIS — E1029 Type 1 diabetes mellitus with other diabetic kidney complication: Secondary | ICD-10-CM | POA: Diagnosis not present

## 2022-03-17 DIAGNOSIS — I69354 Hemiplegia and hemiparesis following cerebral infarction affecting left non-dominant side: Secondary | ICD-10-CM | POA: Diagnosis not present

## 2022-03-17 DIAGNOSIS — R809 Proteinuria, unspecified: Secondary | ICD-10-CM | POA: Diagnosis not present

## 2022-03-22 ENCOUNTER — Encounter: Payer: Self-pay | Admitting: Podiatry

## 2022-03-22 ENCOUNTER — Ambulatory Visit (INDEPENDENT_AMBULATORY_CARE_PROVIDER_SITE_OTHER): Payer: Medicare Other | Admitting: Podiatry

## 2022-03-22 DIAGNOSIS — E1142 Type 2 diabetes mellitus with diabetic polyneuropathy: Secondary | ICD-10-CM | POA: Diagnosis not present

## 2022-03-22 DIAGNOSIS — B351 Tinea unguium: Secondary | ICD-10-CM

## 2022-03-22 NOTE — Progress Notes (Signed)
This patient returns to my office for at risk foot care.  This patient requires this care by a professional since this patient will be at risk due to having chronic kidney disease and diabetes.This patient  is unable to cut nails himself since the patient cannot reach his nails.These nails are painful walking and wearing shoes.  This patient presents for at risk foot care today in a wheelchair. He presents to the office with his wife.  He is in a wheelchair..Patient has not been seen in over 10 months.  General Appearance  Alert, conversant and in no acute stress.  Vascular  Dorsalis pedis and posterior tibial  pulses are not  palpable  bilaterally.  Capillary return is diminished   bilaterally.Cold purple feet   bilaterally.  Neurologic  Senn-Weinstein monofilament wire test absent   bilaterally. Muscle power within normal limits bilaterally.  Nails Thick disfigured discolored nails with subungual debris  from hallux to fifth toes bilaterally. No evidence of bacterial infection or drainage bilaterally.  Orthopedic  No limitations of motion  feet .  No crepitus or effusions noted.  No bony pathology or digital deformities noted.  Skin  normotropic skin with no porokeratosis noted bilaterally.  No signs of infections or ulcers noted.    Onychomycosis  Pain in right toes  Pain in left toes  Consent was obtained for treatment procedures.   Mechanical debridement of nails 1-5  bilaterally performed with a nail nipper.  Filed with dremel without incident.  Cauterized third toenail right foot   Return office visit   3  months                  Told patient to return for periodic foot care and evaluation due to potential at risk complications.   Gardiner Barefoot DPM

## 2022-03-23 DIAGNOSIS — E1042 Type 1 diabetes mellitus with diabetic polyneuropathy: Secondary | ICD-10-CM | POA: Diagnosis not present

## 2022-03-23 DIAGNOSIS — I69354 Hemiplegia and hemiparesis following cerebral infarction affecting left non-dominant side: Secondary | ICD-10-CM | POA: Diagnosis not present

## 2022-03-23 DIAGNOSIS — E1029 Type 1 diabetes mellitus with other diabetic kidney complication: Secondary | ICD-10-CM | POA: Diagnosis not present

## 2022-03-23 DIAGNOSIS — G35 Multiple sclerosis: Secondary | ICD-10-CM | POA: Diagnosis not present

## 2022-03-23 DIAGNOSIS — L89153 Pressure ulcer of sacral region, stage 3: Secondary | ICD-10-CM | POA: Diagnosis not present

## 2022-03-23 DIAGNOSIS — R809 Proteinuria, unspecified: Secondary | ICD-10-CM | POA: Diagnosis not present

## 2022-03-24 DIAGNOSIS — E1029 Type 1 diabetes mellitus with other diabetic kidney complication: Secondary | ICD-10-CM | POA: Diagnosis not present

## 2022-03-24 DIAGNOSIS — R809 Proteinuria, unspecified: Secondary | ICD-10-CM | POA: Diagnosis not present

## 2022-03-24 DIAGNOSIS — L89153 Pressure ulcer of sacral region, stage 3: Secondary | ICD-10-CM | POA: Diagnosis not present

## 2022-03-24 DIAGNOSIS — G35 Multiple sclerosis: Secondary | ICD-10-CM | POA: Diagnosis not present

## 2022-03-24 DIAGNOSIS — I69354 Hemiplegia and hemiparesis following cerebral infarction affecting left non-dominant side: Secondary | ICD-10-CM | POA: Diagnosis not present

## 2022-03-24 DIAGNOSIS — E1042 Type 1 diabetes mellitus with diabetic polyneuropathy: Secondary | ICD-10-CM | POA: Diagnosis not present

## 2022-03-26 DIAGNOSIS — Z556 Problems related to health literacy: Secondary | ICD-10-CM | POA: Diagnosis not present

## 2022-03-26 DIAGNOSIS — Z7902 Long term (current) use of antithrombotics/antiplatelets: Secondary | ICD-10-CM | POA: Diagnosis not present

## 2022-03-26 DIAGNOSIS — I10 Essential (primary) hypertension: Secondary | ICD-10-CM | POA: Diagnosis not present

## 2022-03-26 DIAGNOSIS — Z9181 History of falling: Secondary | ICD-10-CM | POA: Diagnosis not present

## 2022-03-26 DIAGNOSIS — L89153 Pressure ulcer of sacral region, stage 3: Secondary | ICD-10-CM | POA: Diagnosis not present

## 2022-03-26 DIAGNOSIS — I69354 Hemiplegia and hemiparesis following cerebral infarction affecting left non-dominant side: Secondary | ICD-10-CM | POA: Diagnosis not present

## 2022-03-26 DIAGNOSIS — E1044 Type 1 diabetes mellitus with diabetic amyotrophy: Secondary | ICD-10-CM | POA: Diagnosis not present

## 2022-03-26 DIAGNOSIS — G894 Chronic pain syndrome: Secondary | ICD-10-CM | POA: Diagnosis not present

## 2022-03-26 DIAGNOSIS — E1042 Type 1 diabetes mellitus with diabetic polyneuropathy: Secondary | ICD-10-CM | POA: Diagnosis not present

## 2022-03-26 DIAGNOSIS — E538 Deficiency of other specified B group vitamins: Secondary | ICD-10-CM | POA: Diagnosis not present

## 2022-03-26 DIAGNOSIS — R809 Proteinuria, unspecified: Secondary | ICD-10-CM | POA: Diagnosis not present

## 2022-03-26 DIAGNOSIS — E1029 Type 1 diabetes mellitus with other diabetic kidney complication: Secondary | ICD-10-CM | POA: Diagnosis not present

## 2022-03-26 DIAGNOSIS — G35 Multiple sclerosis: Secondary | ICD-10-CM | POA: Diagnosis not present

## 2022-03-26 DIAGNOSIS — I2510 Atherosclerotic heart disease of native coronary artery without angina pectoris: Secondary | ICD-10-CM | POA: Diagnosis not present

## 2022-03-29 DIAGNOSIS — R809 Proteinuria, unspecified: Secondary | ICD-10-CM | POA: Diagnosis not present

## 2022-03-29 DIAGNOSIS — L89153 Pressure ulcer of sacral region, stage 3: Secondary | ICD-10-CM | POA: Diagnosis not present

## 2022-03-29 DIAGNOSIS — G35 Multiple sclerosis: Secondary | ICD-10-CM | POA: Diagnosis not present

## 2022-03-29 DIAGNOSIS — E1042 Type 1 diabetes mellitus with diabetic polyneuropathy: Secondary | ICD-10-CM | POA: Diagnosis not present

## 2022-03-29 DIAGNOSIS — E1029 Type 1 diabetes mellitus with other diabetic kidney complication: Secondary | ICD-10-CM | POA: Diagnosis not present

## 2022-03-29 DIAGNOSIS — I69354 Hemiplegia and hemiparesis following cerebral infarction affecting left non-dominant side: Secondary | ICD-10-CM | POA: Diagnosis not present

## 2022-03-30 DIAGNOSIS — R809 Proteinuria, unspecified: Secondary | ICD-10-CM | POA: Diagnosis not present

## 2022-03-30 DIAGNOSIS — E1029 Type 1 diabetes mellitus with other diabetic kidney complication: Secondary | ICD-10-CM | POA: Diagnosis not present

## 2022-03-30 DIAGNOSIS — L89153 Pressure ulcer of sacral region, stage 3: Secondary | ICD-10-CM | POA: Diagnosis not present

## 2022-03-30 DIAGNOSIS — I69354 Hemiplegia and hemiparesis following cerebral infarction affecting left non-dominant side: Secondary | ICD-10-CM | POA: Diagnosis not present

## 2022-03-30 DIAGNOSIS — E1042 Type 1 diabetes mellitus with diabetic polyneuropathy: Secondary | ICD-10-CM | POA: Diagnosis not present

## 2022-03-30 DIAGNOSIS — G35 Multiple sclerosis: Secondary | ICD-10-CM | POA: Diagnosis not present

## 2022-04-04 ENCOUNTER — Other Ambulatory Visit (HOSPITAL_BASED_OUTPATIENT_CLINIC_OR_DEPARTMENT_OTHER): Payer: Self-pay

## 2022-04-05 ENCOUNTER — Other Ambulatory Visit (HOSPITAL_BASED_OUTPATIENT_CLINIC_OR_DEPARTMENT_OTHER): Payer: Self-pay

## 2022-04-06 DIAGNOSIS — L89153 Pressure ulcer of sacral region, stage 3: Secondary | ICD-10-CM | POA: Diagnosis not present

## 2022-04-06 DIAGNOSIS — R809 Proteinuria, unspecified: Secondary | ICD-10-CM | POA: Diagnosis not present

## 2022-04-06 DIAGNOSIS — I69354 Hemiplegia and hemiparesis following cerebral infarction affecting left non-dominant side: Secondary | ICD-10-CM | POA: Diagnosis not present

## 2022-04-06 DIAGNOSIS — G35 Multiple sclerosis: Secondary | ICD-10-CM | POA: Diagnosis not present

## 2022-04-06 DIAGNOSIS — E1042 Type 1 diabetes mellitus with diabetic polyneuropathy: Secondary | ICD-10-CM | POA: Diagnosis not present

## 2022-04-06 DIAGNOSIS — E1029 Type 1 diabetes mellitus with other diabetic kidney complication: Secondary | ICD-10-CM | POA: Diagnosis not present

## 2022-04-07 DIAGNOSIS — E1042 Type 1 diabetes mellitus with diabetic polyneuropathy: Secondary | ICD-10-CM | POA: Diagnosis not present

## 2022-04-07 DIAGNOSIS — R809 Proteinuria, unspecified: Secondary | ICD-10-CM | POA: Diagnosis not present

## 2022-04-07 DIAGNOSIS — I69354 Hemiplegia and hemiparesis following cerebral infarction affecting left non-dominant side: Secondary | ICD-10-CM | POA: Diagnosis not present

## 2022-04-07 DIAGNOSIS — L89153 Pressure ulcer of sacral region, stage 3: Secondary | ICD-10-CM | POA: Diagnosis not present

## 2022-04-07 DIAGNOSIS — E1029 Type 1 diabetes mellitus with other diabetic kidney complication: Secondary | ICD-10-CM | POA: Diagnosis not present

## 2022-04-07 DIAGNOSIS — G35 Multiple sclerosis: Secondary | ICD-10-CM | POA: Diagnosis not present

## 2022-04-12 DIAGNOSIS — G35 Multiple sclerosis: Secondary | ICD-10-CM | POA: Diagnosis not present

## 2022-04-12 DIAGNOSIS — I69354 Hemiplegia and hemiparesis following cerebral infarction affecting left non-dominant side: Secondary | ICD-10-CM | POA: Diagnosis not present

## 2022-04-12 DIAGNOSIS — E1042 Type 1 diabetes mellitus with diabetic polyneuropathy: Secondary | ICD-10-CM | POA: Diagnosis not present

## 2022-04-12 DIAGNOSIS — L89153 Pressure ulcer of sacral region, stage 3: Secondary | ICD-10-CM | POA: Diagnosis not present

## 2022-04-12 DIAGNOSIS — R809 Proteinuria, unspecified: Secondary | ICD-10-CM | POA: Diagnosis not present

## 2022-04-12 DIAGNOSIS — E1029 Type 1 diabetes mellitus with other diabetic kidney complication: Secondary | ICD-10-CM | POA: Diagnosis not present

## 2022-04-13 DIAGNOSIS — R809 Proteinuria, unspecified: Secondary | ICD-10-CM | POA: Diagnosis not present

## 2022-04-13 DIAGNOSIS — G35 Multiple sclerosis: Secondary | ICD-10-CM | POA: Diagnosis not present

## 2022-04-13 DIAGNOSIS — E1042 Type 1 diabetes mellitus with diabetic polyneuropathy: Secondary | ICD-10-CM | POA: Diagnosis not present

## 2022-04-13 DIAGNOSIS — E1029 Type 1 diabetes mellitus with other diabetic kidney complication: Secondary | ICD-10-CM | POA: Diagnosis not present

## 2022-04-13 DIAGNOSIS — I69354 Hemiplegia and hemiparesis following cerebral infarction affecting left non-dominant side: Secondary | ICD-10-CM | POA: Diagnosis not present

## 2022-04-13 DIAGNOSIS — L89153 Pressure ulcer of sacral region, stage 3: Secondary | ICD-10-CM | POA: Diagnosis not present

## 2022-04-14 ENCOUNTER — Other Ambulatory Visit (HOSPITAL_BASED_OUTPATIENT_CLINIC_OR_DEPARTMENT_OTHER): Payer: Self-pay

## 2022-04-14 DIAGNOSIS — E1042 Type 1 diabetes mellitus with diabetic polyneuropathy: Secondary | ICD-10-CM | POA: Diagnosis not present

## 2022-04-14 DIAGNOSIS — Z79891 Long term (current) use of opiate analgesic: Secondary | ICD-10-CM | POA: Diagnosis not present

## 2022-04-14 DIAGNOSIS — M79672 Pain in left foot: Secondary | ICD-10-CM | POA: Diagnosis not present

## 2022-04-14 DIAGNOSIS — M79671 Pain in right foot: Secondary | ICD-10-CM | POA: Diagnosis not present

## 2022-04-14 MED ORDER — OXYCODONE HCL 15 MG PO TABS
15.0000 mg | ORAL_TABLET | Freq: Four times a day (QID) | ORAL | 0 refills | Status: DC | PRN
  Filled 2022-06-01: qty 120, 30d supply, fill #0

## 2022-04-14 MED ORDER — OXYCODONE HCL 15 MG PO TABS
15.0000 mg | ORAL_TABLET | Freq: Four times a day (QID) | ORAL | 0 refills | Status: DC | PRN
  Filled 2022-05-03: qty 120, 30d supply, fill #0

## 2022-04-14 MED ORDER — NUCYNTA ER 200 MG PO TB12
200.0000 mg | ORAL_TABLET | Freq: Two times a day (BID) | ORAL | 0 refills | Status: DC
  Filled 2022-06-01: qty 60, 30d supply, fill #0

## 2022-04-14 MED ORDER — NUCYNTA ER 200 MG PO TB12
200.0000 mg | ORAL_TABLET | Freq: Two times a day (BID) | ORAL | 0 refills | Status: DC
  Filled 2022-05-03: qty 60, 30d supply, fill #0

## 2022-04-22 DIAGNOSIS — R809 Proteinuria, unspecified: Secondary | ICD-10-CM | POA: Diagnosis not present

## 2022-04-22 DIAGNOSIS — E1042 Type 1 diabetes mellitus with diabetic polyneuropathy: Secondary | ICD-10-CM | POA: Diagnosis not present

## 2022-04-22 DIAGNOSIS — L89153 Pressure ulcer of sacral region, stage 3: Secondary | ICD-10-CM | POA: Diagnosis not present

## 2022-04-22 DIAGNOSIS — E1029 Type 1 diabetes mellitus with other diabetic kidney complication: Secondary | ICD-10-CM | POA: Diagnosis not present

## 2022-04-22 DIAGNOSIS — G35 Multiple sclerosis: Secondary | ICD-10-CM | POA: Diagnosis not present

## 2022-04-22 DIAGNOSIS — I69354 Hemiplegia and hemiparesis following cerebral infarction affecting left non-dominant side: Secondary | ICD-10-CM | POA: Diagnosis not present

## 2022-04-25 DIAGNOSIS — E1044 Type 1 diabetes mellitus with diabetic amyotrophy: Secondary | ICD-10-CM | POA: Diagnosis not present

## 2022-04-25 DIAGNOSIS — I10 Essential (primary) hypertension: Secondary | ICD-10-CM | POA: Diagnosis not present

## 2022-04-25 DIAGNOSIS — L89153 Pressure ulcer of sacral region, stage 3: Secondary | ICD-10-CM | POA: Diagnosis not present

## 2022-04-25 DIAGNOSIS — G35 Multiple sclerosis: Secondary | ICD-10-CM | POA: Diagnosis not present

## 2022-04-25 DIAGNOSIS — E1029 Type 1 diabetes mellitus with other diabetic kidney complication: Secondary | ICD-10-CM | POA: Diagnosis not present

## 2022-04-25 DIAGNOSIS — Z7902 Long term (current) use of antithrombotics/antiplatelets: Secondary | ICD-10-CM | POA: Diagnosis not present

## 2022-04-25 DIAGNOSIS — R809 Proteinuria, unspecified: Secondary | ICD-10-CM | POA: Diagnosis not present

## 2022-04-25 DIAGNOSIS — I2510 Atherosclerotic heart disease of native coronary artery without angina pectoris: Secondary | ICD-10-CM | POA: Diagnosis not present

## 2022-04-25 DIAGNOSIS — Z9181 History of falling: Secondary | ICD-10-CM | POA: Diagnosis not present

## 2022-04-25 DIAGNOSIS — G894 Chronic pain syndrome: Secondary | ICD-10-CM | POA: Diagnosis not present

## 2022-04-25 DIAGNOSIS — I69354 Hemiplegia and hemiparesis following cerebral infarction affecting left non-dominant side: Secondary | ICD-10-CM | POA: Diagnosis not present

## 2022-04-25 DIAGNOSIS — E538 Deficiency of other specified B group vitamins: Secondary | ICD-10-CM | POA: Diagnosis not present

## 2022-04-25 DIAGNOSIS — E1042 Type 1 diabetes mellitus with diabetic polyneuropathy: Secondary | ICD-10-CM | POA: Diagnosis not present

## 2022-04-27 DIAGNOSIS — R809 Proteinuria, unspecified: Secondary | ICD-10-CM | POA: Diagnosis not present

## 2022-04-27 DIAGNOSIS — L89153 Pressure ulcer of sacral region, stage 3: Secondary | ICD-10-CM | POA: Diagnosis not present

## 2022-04-27 DIAGNOSIS — E1042 Type 1 diabetes mellitus with diabetic polyneuropathy: Secondary | ICD-10-CM | POA: Diagnosis not present

## 2022-04-27 DIAGNOSIS — I69354 Hemiplegia and hemiparesis following cerebral infarction affecting left non-dominant side: Secondary | ICD-10-CM | POA: Diagnosis not present

## 2022-04-27 DIAGNOSIS — G35 Multiple sclerosis: Secondary | ICD-10-CM | POA: Diagnosis not present

## 2022-04-27 DIAGNOSIS — E1029 Type 1 diabetes mellitus with other diabetic kidney complication: Secondary | ICD-10-CM | POA: Diagnosis not present

## 2022-04-29 ENCOUNTER — Other Ambulatory Visit (HOSPITAL_BASED_OUTPATIENT_CLINIC_OR_DEPARTMENT_OTHER): Payer: Self-pay

## 2022-05-03 ENCOUNTER — Other Ambulatory Visit (HOSPITAL_BASED_OUTPATIENT_CLINIC_OR_DEPARTMENT_OTHER): Payer: Self-pay

## 2022-05-03 DIAGNOSIS — G629 Polyneuropathy, unspecified: Secondary | ICD-10-CM | POA: Diagnosis not present

## 2022-05-03 DIAGNOSIS — L89153 Pressure ulcer of sacral region, stage 3: Secondary | ICD-10-CM | POA: Diagnosis not present

## 2022-05-03 DIAGNOSIS — G35 Multiple sclerosis: Secondary | ICD-10-CM | POA: Diagnosis not present

## 2022-05-03 DIAGNOSIS — Z7409 Other reduced mobility: Secondary | ICD-10-CM | POA: Diagnosis not present

## 2022-05-04 DIAGNOSIS — R809 Proteinuria, unspecified: Secondary | ICD-10-CM | POA: Diagnosis not present

## 2022-05-04 DIAGNOSIS — L89153 Pressure ulcer of sacral region, stage 3: Secondary | ICD-10-CM | POA: Diagnosis not present

## 2022-05-04 DIAGNOSIS — E1029 Type 1 diabetes mellitus with other diabetic kidney complication: Secondary | ICD-10-CM | POA: Diagnosis not present

## 2022-05-04 DIAGNOSIS — E1042 Type 1 diabetes mellitus with diabetic polyneuropathy: Secondary | ICD-10-CM | POA: Diagnosis not present

## 2022-05-04 DIAGNOSIS — I69354 Hemiplegia and hemiparesis following cerebral infarction affecting left non-dominant side: Secondary | ICD-10-CM | POA: Diagnosis not present

## 2022-05-04 DIAGNOSIS — G35 Multiple sclerosis: Secondary | ICD-10-CM | POA: Diagnosis not present

## 2022-05-11 DIAGNOSIS — R809 Proteinuria, unspecified: Secondary | ICD-10-CM | POA: Diagnosis not present

## 2022-05-11 DIAGNOSIS — G35 Multiple sclerosis: Secondary | ICD-10-CM | POA: Diagnosis not present

## 2022-05-11 DIAGNOSIS — I69354 Hemiplegia and hemiparesis following cerebral infarction affecting left non-dominant side: Secondary | ICD-10-CM | POA: Diagnosis not present

## 2022-05-11 DIAGNOSIS — E1029 Type 1 diabetes mellitus with other diabetic kidney complication: Secondary | ICD-10-CM | POA: Diagnosis not present

## 2022-05-11 DIAGNOSIS — L89153 Pressure ulcer of sacral region, stage 3: Secondary | ICD-10-CM | POA: Diagnosis not present

## 2022-05-11 DIAGNOSIS — E1042 Type 1 diabetes mellitus with diabetic polyneuropathy: Secondary | ICD-10-CM | POA: Diagnosis not present

## 2022-05-17 DIAGNOSIS — I69354 Hemiplegia and hemiparesis following cerebral infarction affecting left non-dominant side: Secondary | ICD-10-CM | POA: Diagnosis not present

## 2022-05-17 DIAGNOSIS — R809 Proteinuria, unspecified: Secondary | ICD-10-CM | POA: Diagnosis not present

## 2022-05-17 DIAGNOSIS — E1042 Type 1 diabetes mellitus with diabetic polyneuropathy: Secondary | ICD-10-CM | POA: Diagnosis not present

## 2022-05-17 DIAGNOSIS — L89153 Pressure ulcer of sacral region, stage 3: Secondary | ICD-10-CM | POA: Diagnosis not present

## 2022-05-17 DIAGNOSIS — E1029 Type 1 diabetes mellitus with other diabetic kidney complication: Secondary | ICD-10-CM | POA: Diagnosis not present

## 2022-05-17 DIAGNOSIS — G35 Multiple sclerosis: Secondary | ICD-10-CM | POA: Diagnosis not present

## 2022-05-18 ENCOUNTER — Other Ambulatory Visit (HOSPITAL_BASED_OUTPATIENT_CLINIC_OR_DEPARTMENT_OTHER): Payer: Self-pay

## 2022-05-18 DIAGNOSIS — L89153 Pressure ulcer of sacral region, stage 3: Secondary | ICD-10-CM | POA: Diagnosis not present

## 2022-05-18 DIAGNOSIS — G894 Chronic pain syndrome: Secondary | ICD-10-CM | POA: Diagnosis not present

## 2022-05-18 DIAGNOSIS — I129 Hypertensive chronic kidney disease with stage 1 through stage 4 chronic kidney disease, or unspecified chronic kidney disease: Secondary | ICD-10-CM | POA: Diagnosis not present

## 2022-05-18 DIAGNOSIS — E1029 Type 1 diabetes mellitus with other diabetic kidney complication: Secondary | ICD-10-CM | POA: Diagnosis not present

## 2022-05-18 DIAGNOSIS — N1831 Chronic kidney disease, stage 3a: Secondary | ICD-10-CM | POA: Diagnosis not present

## 2022-05-18 DIAGNOSIS — G35 Multiple sclerosis: Secondary | ICD-10-CM | POA: Diagnosis not present

## 2022-05-18 MED ORDER — CEFDINIR 300 MG PO CAPS
300.0000 mg | ORAL_CAPSULE | Freq: Two times a day (BID) | ORAL | 0 refills | Status: DC
  Filled 2022-05-18: qty 14, 7d supply, fill #0
  Filled 2022-05-18: qty 6, 3d supply, fill #0
  Filled 2022-05-18: qty 8, 4d supply, fill #0

## 2022-05-19 ENCOUNTER — Other Ambulatory Visit (HOSPITAL_BASED_OUTPATIENT_CLINIC_OR_DEPARTMENT_OTHER): Payer: Self-pay

## 2022-05-23 DIAGNOSIS — H40013 Open angle with borderline findings, low risk, bilateral: Secondary | ICD-10-CM | POA: Diagnosis not present

## 2022-05-23 DIAGNOSIS — H524 Presbyopia: Secondary | ICD-10-CM | POA: Diagnosis not present

## 2022-05-23 DIAGNOSIS — Z961 Presence of intraocular lens: Secondary | ICD-10-CM | POA: Diagnosis not present

## 2022-05-23 DIAGNOSIS — E119 Type 2 diabetes mellitus without complications: Secondary | ICD-10-CM | POA: Diagnosis not present

## 2022-05-25 DIAGNOSIS — I69354 Hemiplegia and hemiparesis following cerebral infarction affecting left non-dominant side: Secondary | ICD-10-CM | POA: Diagnosis not present

## 2022-05-25 DIAGNOSIS — L89153 Pressure ulcer of sacral region, stage 3: Secondary | ICD-10-CM | POA: Diagnosis not present

## 2022-05-25 DIAGNOSIS — I10 Essential (primary) hypertension: Secondary | ICD-10-CM | POA: Diagnosis not present

## 2022-05-25 DIAGNOSIS — I2510 Atherosclerotic heart disease of native coronary artery without angina pectoris: Secondary | ICD-10-CM | POA: Diagnosis not present

## 2022-05-25 DIAGNOSIS — G35 Multiple sclerosis: Secondary | ICD-10-CM | POA: Diagnosis not present

## 2022-05-25 DIAGNOSIS — E1042 Type 1 diabetes mellitus with diabetic polyneuropathy: Secondary | ICD-10-CM | POA: Diagnosis not present

## 2022-05-25 DIAGNOSIS — Z7902 Long term (current) use of antithrombotics/antiplatelets: Secondary | ICD-10-CM | POA: Diagnosis not present

## 2022-05-25 DIAGNOSIS — G894 Chronic pain syndrome: Secondary | ICD-10-CM | POA: Diagnosis not present

## 2022-05-25 DIAGNOSIS — E538 Deficiency of other specified B group vitamins: Secondary | ICD-10-CM | POA: Diagnosis not present

## 2022-05-25 DIAGNOSIS — E1029 Type 1 diabetes mellitus with other diabetic kidney complication: Secondary | ICD-10-CM | POA: Diagnosis not present

## 2022-05-25 DIAGNOSIS — E1044 Type 1 diabetes mellitus with diabetic amyotrophy: Secondary | ICD-10-CM | POA: Diagnosis not present

## 2022-05-25 DIAGNOSIS — Z9181 History of falling: Secondary | ICD-10-CM | POA: Diagnosis not present

## 2022-05-25 DIAGNOSIS — R809 Proteinuria, unspecified: Secondary | ICD-10-CM | POA: Diagnosis not present

## 2022-05-31 ENCOUNTER — Other Ambulatory Visit (HOSPITAL_COMMUNITY): Payer: Self-pay

## 2022-05-31 ENCOUNTER — Other Ambulatory Visit (HOSPITAL_BASED_OUTPATIENT_CLINIC_OR_DEPARTMENT_OTHER): Payer: Self-pay

## 2022-06-01 ENCOUNTER — Other Ambulatory Visit (HOSPITAL_BASED_OUTPATIENT_CLINIC_OR_DEPARTMENT_OTHER): Payer: Self-pay

## 2022-06-01 DIAGNOSIS — I69354 Hemiplegia and hemiparesis following cerebral infarction affecting left non-dominant side: Secondary | ICD-10-CM | POA: Diagnosis not present

## 2022-06-01 DIAGNOSIS — E1042 Type 1 diabetes mellitus with diabetic polyneuropathy: Secondary | ICD-10-CM | POA: Diagnosis not present

## 2022-06-01 DIAGNOSIS — R809 Proteinuria, unspecified: Secondary | ICD-10-CM | POA: Diagnosis not present

## 2022-06-01 DIAGNOSIS — E1029 Type 1 diabetes mellitus with other diabetic kidney complication: Secondary | ICD-10-CM | POA: Diagnosis not present

## 2022-06-01 DIAGNOSIS — G35 Multiple sclerosis: Secondary | ICD-10-CM | POA: Diagnosis not present

## 2022-06-01 DIAGNOSIS — L89153 Pressure ulcer of sacral region, stage 3: Secondary | ICD-10-CM | POA: Diagnosis not present

## 2022-06-08 DIAGNOSIS — I69354 Hemiplegia and hemiparesis following cerebral infarction affecting left non-dominant side: Secondary | ICD-10-CM | POA: Diagnosis not present

## 2022-06-08 DIAGNOSIS — E1042 Type 1 diabetes mellitus with diabetic polyneuropathy: Secondary | ICD-10-CM | POA: Diagnosis not present

## 2022-06-08 DIAGNOSIS — G35 Multiple sclerosis: Secondary | ICD-10-CM | POA: Diagnosis not present

## 2022-06-08 DIAGNOSIS — E1029 Type 1 diabetes mellitus with other diabetic kidney complication: Secondary | ICD-10-CM | POA: Diagnosis not present

## 2022-06-08 DIAGNOSIS — L89153 Pressure ulcer of sacral region, stage 3: Secondary | ICD-10-CM | POA: Diagnosis not present

## 2022-06-08 DIAGNOSIS — R809 Proteinuria, unspecified: Secondary | ICD-10-CM | POA: Diagnosis not present

## 2022-06-15 DIAGNOSIS — L89153 Pressure ulcer of sacral region, stage 3: Secondary | ICD-10-CM | POA: Diagnosis not present

## 2022-06-15 DIAGNOSIS — I69354 Hemiplegia and hemiparesis following cerebral infarction affecting left non-dominant side: Secondary | ICD-10-CM | POA: Diagnosis not present

## 2022-06-15 DIAGNOSIS — G35 Multiple sclerosis: Secondary | ICD-10-CM | POA: Diagnosis not present

## 2022-06-15 DIAGNOSIS — E1029 Type 1 diabetes mellitus with other diabetic kidney complication: Secondary | ICD-10-CM | POA: Diagnosis not present

## 2022-06-15 DIAGNOSIS — E1042 Type 1 diabetes mellitus with diabetic polyneuropathy: Secondary | ICD-10-CM | POA: Diagnosis not present

## 2022-06-15 DIAGNOSIS — R809 Proteinuria, unspecified: Secondary | ICD-10-CM | POA: Diagnosis not present

## 2022-06-21 ENCOUNTER — Encounter: Payer: Self-pay | Admitting: Podiatry

## 2022-06-21 ENCOUNTER — Ambulatory Visit (INDEPENDENT_AMBULATORY_CARE_PROVIDER_SITE_OTHER): Payer: Medicare Other | Admitting: Podiatry

## 2022-06-21 DIAGNOSIS — B351 Tinea unguium: Secondary | ICD-10-CM

## 2022-06-21 DIAGNOSIS — E1142 Type 2 diabetes mellitus with diabetic polyneuropathy: Secondary | ICD-10-CM

## 2022-06-21 NOTE — Progress Notes (Signed)
This patient returns to my office for at risk foot care.  This patient requires this care by a professional since this patient will be at risk due to having chronic kidney disease and diabetes.This patient  is unable to cut nails himself since the patient cannot reach his nails.These nails are painful walking and wearing shoes.  This patient presents for at risk foot care today in a wheelchair. He presents to the office with his wife.  He is in a wheelchair..Patient has not been seen in over 10 months.  General Appearance  Alert, conversant and in no acute stress.  Vascular  Dorsalis pedis and posterior tibial  pulses are not  palpable  bilaterally.  Capillary return is diminished   bilaterally.Cold purple feet   bilaterally.  Neurologic  Senn-Weinstein monofilament wire test absent   bilaterally. Muscle power within normal limits bilaterally.  Nails Thick disfigured discolored nails with subungual debris  from hallux to fifth toes bilaterally. No evidence of bacterial infection or drainage bilaterally.  Orthopedic  No limitations of motion  feet .  No crepitus or effusions noted.  No bony pathology or digital deformities noted.  Skin  normotropic skin with no porokeratosis noted bilaterally.  No signs of infections or ulcers noted.    Onychomycosis  Pain in right toes  Pain in left toes  Consent was obtained for treatment procedures.   Mechanical debridement of nails 1-5  bilaterally performed with a nail nipper.  Filed with dremel without incident.  Cauterized hallux right foot   Return office visit   3  months                  Told patient to return for periodic foot care and evaluation due to potential at risk complications.   Helane Gunther DPM

## 2022-06-22 DIAGNOSIS — E1029 Type 1 diabetes mellitus with other diabetic kidney complication: Secondary | ICD-10-CM | POA: Diagnosis not present

## 2022-06-22 DIAGNOSIS — G35 Multiple sclerosis: Secondary | ICD-10-CM | POA: Diagnosis not present

## 2022-06-22 DIAGNOSIS — E1042 Type 1 diabetes mellitus with diabetic polyneuropathy: Secondary | ICD-10-CM | POA: Diagnosis not present

## 2022-06-22 DIAGNOSIS — R809 Proteinuria, unspecified: Secondary | ICD-10-CM | POA: Diagnosis not present

## 2022-06-22 DIAGNOSIS — I69354 Hemiplegia and hemiparesis following cerebral infarction affecting left non-dominant side: Secondary | ICD-10-CM | POA: Diagnosis not present

## 2022-06-22 DIAGNOSIS — L89153 Pressure ulcer of sacral region, stage 3: Secondary | ICD-10-CM | POA: Diagnosis not present

## 2022-06-23 ENCOUNTER — Other Ambulatory Visit (HOSPITAL_BASED_OUTPATIENT_CLINIC_OR_DEPARTMENT_OTHER): Payer: Self-pay

## 2022-06-23 DIAGNOSIS — E1042 Type 1 diabetes mellitus with diabetic polyneuropathy: Secondary | ICD-10-CM | POA: Diagnosis not present

## 2022-06-23 DIAGNOSIS — M79672 Pain in left foot: Secondary | ICD-10-CM | POA: Diagnosis not present

## 2022-06-23 DIAGNOSIS — Z79891 Long term (current) use of opiate analgesic: Secondary | ICD-10-CM | POA: Diagnosis not present

## 2022-06-23 DIAGNOSIS — M79671 Pain in right foot: Secondary | ICD-10-CM | POA: Diagnosis not present

## 2022-06-23 MED ORDER — NUCYNTA ER 200 MG PO TB12
200.0000 mg | ORAL_TABLET | Freq: Two times a day (BID) | ORAL | 0 refills | Status: DC
  Filled 2022-07-05: qty 60, 30d supply, fill #0

## 2022-06-23 MED ORDER — NUCYNTA ER 200 MG PO TB12
200.0000 mg | ORAL_TABLET | Freq: Two times a day (BID) | ORAL | 0 refills | Status: DC
  Filled 2022-08-03: qty 60, 30d supply, fill #0

## 2022-06-23 MED ORDER — OXYCODONE HCL 15 MG PO TABS
15.0000 mg | ORAL_TABLET | Freq: Four times a day (QID) | ORAL | 0 refills | Status: DC | PRN
  Filled 2022-07-05: qty 120, 30d supply, fill #0

## 2022-06-23 MED ORDER — OXYCODONE HCL 15 MG PO TABS
15.0000 mg | ORAL_TABLET | Freq: Four times a day (QID) | ORAL | 0 refills | Status: DC | PRN
  Filled 2022-08-03: qty 120, 30d supply, fill #0

## 2022-07-05 ENCOUNTER — Other Ambulatory Visit (HOSPITAL_BASED_OUTPATIENT_CLINIC_OR_DEPARTMENT_OTHER): Payer: Self-pay

## 2022-07-13 ENCOUNTER — Ambulatory Visit: Payer: Medicare Other

## 2022-07-13 DIAGNOSIS — E1142 Type 2 diabetes mellitus with diabetic polyneuropathy: Secondary | ICD-10-CM

## 2022-07-13 NOTE — Progress Notes (Signed)
Patient presents today to be measured for diabetic shoes and insoles.  Patient was measure 1 pair of diabetic shoes and 3 pairs of foam casted diabetic insoles.   Wt 200lbs  Shoe size 11.5 xw  [id number] style   Dr Geoffry Paradise  Re-appointment for regularly scheduled diabetic foot care visits or if they should experience any trouble with the shoes or insoles.

## 2022-07-19 ENCOUNTER — Other Ambulatory Visit (HOSPITAL_BASED_OUTPATIENT_CLINIC_OR_DEPARTMENT_OTHER): Payer: Self-pay

## 2022-08-02 ENCOUNTER — Other Ambulatory Visit (HOSPITAL_BASED_OUTPATIENT_CLINIC_OR_DEPARTMENT_OTHER): Payer: Self-pay

## 2022-08-03 ENCOUNTER — Other Ambulatory Visit (HOSPITAL_BASED_OUTPATIENT_CLINIC_OR_DEPARTMENT_OTHER): Payer: Self-pay

## 2022-08-23 ENCOUNTER — Other Ambulatory Visit (HOSPITAL_BASED_OUTPATIENT_CLINIC_OR_DEPARTMENT_OTHER): Payer: Self-pay

## 2022-08-23 DIAGNOSIS — E1042 Type 1 diabetes mellitus with diabetic polyneuropathy: Secondary | ICD-10-CM | POA: Diagnosis not present

## 2022-08-23 DIAGNOSIS — Z79891 Long term (current) use of opiate analgesic: Secondary | ICD-10-CM | POA: Diagnosis not present

## 2022-08-23 DIAGNOSIS — M79671 Pain in right foot: Secondary | ICD-10-CM | POA: Diagnosis not present

## 2022-08-23 DIAGNOSIS — M79672 Pain in left foot: Secondary | ICD-10-CM | POA: Diagnosis not present

## 2022-08-23 MED ORDER — NUCYNTA ER 200 MG PO TB12
200.0000 mg | ORAL_TABLET | Freq: Two times a day (BID) | ORAL | 0 refills | Status: DC
  Filled 2022-08-31: qty 60, 30d supply, fill #0

## 2022-08-23 MED ORDER — OXYCODONE HCL 15 MG PO TABS
15.0000 mg | ORAL_TABLET | Freq: Four times a day (QID) | ORAL | 0 refills | Status: DC | PRN
  Filled 2022-09-29: qty 120, 30d supply, fill #0

## 2022-08-23 MED ORDER — NUCYNTA ER 200 MG PO TB12
200.0000 mg | ORAL_TABLET | Freq: Two times a day (BID) | ORAL | 0 refills | Status: DC
  Filled 2022-09-29: qty 60, 30d supply, fill #0

## 2022-08-23 MED ORDER — OXYCODONE HCL 15 MG PO TABS
15.0000 mg | ORAL_TABLET | Freq: Four times a day (QID) | ORAL | 0 refills | Status: DC | PRN
  Filled 2022-08-31: qty 120, 30d supply, fill #0

## 2022-08-30 ENCOUNTER — Other Ambulatory Visit (HOSPITAL_BASED_OUTPATIENT_CLINIC_OR_DEPARTMENT_OTHER): Payer: Self-pay

## 2022-08-31 ENCOUNTER — Other Ambulatory Visit (HOSPITAL_BASED_OUTPATIENT_CLINIC_OR_DEPARTMENT_OTHER): Payer: Self-pay

## 2022-09-07 DIAGNOSIS — G35 Multiple sclerosis: Secondary | ICD-10-CM | POA: Diagnosis not present

## 2022-09-07 DIAGNOSIS — N1831 Chronic kidney disease, stage 3a: Secondary | ICD-10-CM | POA: Diagnosis not present

## 2022-09-07 DIAGNOSIS — E1029 Type 1 diabetes mellitus with other diabetic kidney complication: Secondary | ICD-10-CM | POA: Diagnosis not present

## 2022-09-07 DIAGNOSIS — G894 Chronic pain syndrome: Secondary | ICD-10-CM | POA: Diagnosis not present

## 2022-09-07 DIAGNOSIS — I129 Hypertensive chronic kidney disease with stage 1 through stage 4 chronic kidney disease, or unspecified chronic kidney disease: Secondary | ICD-10-CM | POA: Diagnosis not present

## 2022-09-12 ENCOUNTER — Telehealth: Payer: Self-pay | Admitting: Podiatry

## 2022-09-12 NOTE — Telephone Encounter (Signed)
Patient Wife called to see if orthotics are back. [name] was casted on 07/13/2022 and they have not heard anything. He is scheduled for 09/21/2022 @ 2:15 and wanted to pick them up at that time if possible.

## 2022-09-21 ENCOUNTER — Other Ambulatory Visit: Payer: Self-pay | Admitting: Podiatry

## 2022-09-21 ENCOUNTER — Other Ambulatory Visit (HOSPITAL_BASED_OUTPATIENT_CLINIC_OR_DEPARTMENT_OTHER): Payer: Self-pay

## 2022-09-21 ENCOUNTER — Ambulatory Visit (INDEPENDENT_AMBULATORY_CARE_PROVIDER_SITE_OTHER): Payer: Medicare Other | Admitting: Podiatry

## 2022-09-21 ENCOUNTER — Ambulatory Visit (INDEPENDENT_AMBULATORY_CARE_PROVIDER_SITE_OTHER): Payer: Medicare Other

## 2022-09-21 DIAGNOSIS — E1142 Type 2 diabetes mellitus with diabetic polyneuropathy: Secondary | ICD-10-CM

## 2022-09-21 DIAGNOSIS — M79671 Pain in right foot: Secondary | ICD-10-CM

## 2022-09-21 DIAGNOSIS — I739 Peripheral vascular disease, unspecified: Secondary | ICD-10-CM

## 2022-09-21 DIAGNOSIS — M216X9 Other acquired deformities of unspecified foot: Secondary | ICD-10-CM | POA: Diagnosis not present

## 2022-09-21 DIAGNOSIS — L089 Local infection of the skin and subcutaneous tissue, unspecified: Secondary | ICD-10-CM | POA: Diagnosis not present

## 2022-09-21 DIAGNOSIS — D689 Coagulation defect, unspecified: Secondary | ICD-10-CM

## 2022-09-21 MED ORDER — DOXYCYCLINE HYCLATE 100 MG PO TABS
100.0000 mg | ORAL_TABLET | Freq: Two times a day (BID) | ORAL | 0 refills | Status: DC
  Filled 2022-09-21: qty 20, 10d supply, fill #0

## 2022-09-21 NOTE — Addendum Note (Signed)
Addended byMaury Dus,  L on: 09/21/2022 04:38 PM   Modules accepted: Orders

## 2022-09-21 NOTE — Progress Notes (Signed)
This patient presents to the office for evaluation of his right foot.  He presents to the office with reddish purplish top of right foot.  His wife says he injured his foot on the door entering our building.   She also says this discoloration was not present yesterday.  Marland KitchenHe also has blackened skin lesion on his second toe right foot.  He also presents to the office to pick up his diabetic shoes.  He presents for nail care but I believe the discoloration needs to be addressed.  Patient is taking plavix.  He is also diabetic.  He is wheelchair bound.  General Appearance  Alert, conversant and in no acute stress.  Vascular  Dorsalis pedis and posterior tibial  pulses are absent  bilaterally.  Capillary return is within normal limits  bilaterally. Cold feet  bilaterally. Swelling right foot.Evans recommended an X-ray.  Evans  Neurologic  Senn-Weinstein monofilament wire test absent  bilaterally. Muscle power within normal limits bilaterally.  Nails Thick disfigured discolored nails with subungual debris  from hallux to fifth toes bilaterally. No evidence of bacterial infection or drainage bilaterally.  Orthopedic  No limitations of motion  feet .  No crepitus or effusions noted.  No bony pathology or digital deformities noted.  Skin  normotropic skin with no porokeratosis noted bilaterally.  No signs of infections or ulcers noted.  Black skin lesion second toe right foot.  Reddish purplish discoloration on the dorsum of right foot extending from his toes extending laterally.    Infection right foot.  PVD.  Onychomycosis  B/L.  Diabetic neuropathy  ROV 3.  Xray taken right foot.  Upon examining his feet prior to nail care I chose to seek a consult from Dr.  Logan Bores about his purplish discoloration.  Dr.  Logan Bores recommended an X-ray to rule out bony pathology. No evidence of bony fracture to metatarsals or toes right foot.  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  He recommended an antibiotic to be  prescribed.  Prescribed doxycycline.  Dr.  Logan Bores also recommended vascular studies.  Patient had second toe bandaged.  He was also told not to wear footgear over his toes.  He was dispensed diabetic shoes.    RTC 10 days to see Dr.  Logan Bores.  Helane Gunther DPM     .

## 2022-09-22 ENCOUNTER — Other Ambulatory Visit: Payer: Self-pay | Admitting: Podiatry

## 2022-09-22 DIAGNOSIS — E1142 Type 2 diabetes mellitus with diabetic polyneuropathy: Secondary | ICD-10-CM

## 2022-09-22 DIAGNOSIS — D689 Coagulation defect, unspecified: Secondary | ICD-10-CM

## 2022-09-22 DIAGNOSIS — I739 Peripheral vascular disease, unspecified: Secondary | ICD-10-CM

## 2022-09-22 DIAGNOSIS — M79671 Pain in right foot: Secondary | ICD-10-CM

## 2022-09-22 DIAGNOSIS — L089 Local infection of the skin and subcutaneous tissue, unspecified: Secondary | ICD-10-CM

## 2022-09-23 ENCOUNTER — Ambulatory Visit (HOSPITAL_COMMUNITY)
Admission: RE | Admit: 2022-09-23 | Discharge: 2022-09-23 | Disposition: A | Discharge status: Home / Self Care | Payer: Medicare Other | Source: Ambulatory Visit | Attending: Vascular Surgery | Admitting: Vascular Surgery

## 2022-09-23 DIAGNOSIS — E1142 Type 2 diabetes mellitus with diabetic polyneuropathy: Secondary | ICD-10-CM | POA: Diagnosis not present

## 2022-09-23 DIAGNOSIS — I739 Peripheral vascular disease, unspecified: Secondary | ICD-10-CM

## 2022-09-23 LAB — VAS US ABI WITH/WO TBI

## 2022-09-28 ENCOUNTER — Other Ambulatory Visit (HOSPITAL_BASED_OUTPATIENT_CLINIC_OR_DEPARTMENT_OTHER): Payer: Self-pay

## 2022-09-29 ENCOUNTER — Other Ambulatory Visit (HOSPITAL_BASED_OUTPATIENT_CLINIC_OR_DEPARTMENT_OTHER): Payer: Self-pay

## 2022-10-05 ENCOUNTER — Encounter: Payer: Self-pay | Admitting: Podiatry

## 2022-10-05 ENCOUNTER — Ambulatory Visit (INDEPENDENT_AMBULATORY_CARE_PROVIDER_SITE_OTHER): Payer: Medicare Other | Admitting: Podiatry

## 2022-10-05 DIAGNOSIS — L97512 Non-pressure chronic ulcer of other part of right foot with fat layer exposed: Secondary | ICD-10-CM | POA: Diagnosis not present

## 2022-10-05 DIAGNOSIS — E0843 Diabetes mellitus due to underlying condition with diabetic autonomic (poly)neuropathy: Secondary | ICD-10-CM | POA: Diagnosis not present

## 2022-10-05 NOTE — Progress Notes (Signed)
Chief Complaint  Patient presents with   Diabetic Ulcer    Patient is here for F/U on right foot ulcers and redness    Subjective:  77 y.o. male with PMHx of diabetes mellitus nonambulatory presenting for follow-up evaluation of an ulcer to the PIPJ of the second digit of the right foot.  They have been applying Betadine and dressings.  Vascular studies also obtained.  They do have a follow-up appointment with [facility] on 11/02/2022.  Presenting for further treatment evaluation  Past Medical History:  Diagnosis Date   Arthritis    Benign prostatic hypertrophy with hesitancy    Charcot's arthropathy, diabetic (HCC)    left foot   Chronic pain syndrome followed by dr mark phillps at pain clinic   secondary to MS and diabetic neuropathy   Chronic periodontal disease    CKD (chronic kidney disease), stage II    hypertensive per PCP note   Coronary artery disease hx MI  s/p  DES to [facility] 2005   hx cardiologist-- dr Riley Kill until he retired,  now followed by pcp , dr Jacky Kindle   Depression    Diabetic neuropathy [facility])    Essential tremor    Gait disorder    uses walker   History of CVA (cerebrovascular accident)    2011-  right subcortical cva--  residual left mininmal side weakness and no use of left head   History of CVA with residual deficit    2011  --  right subcortical cva w/ mininmal left hemiparesis   History of diabetic ulcer of foot    2010  left foot   History of MI (myocardial infarction)    2005   Hypertension    dr Jacky Kindle   MS (multiple sclerosis) (HCC)    dx 1985   PONV (postoperative nausea and vomiting)    S/P drug eluting coronary stent placement    2005  to midRCA   Type 1 diabetes mellitus on insulin therapy [facility])     Past Surgical History:  Procedure Laterality Date   CATARACT EXTRACTION W/ INTRAOCULAR LENS  IMPLANT, BILATERAL  Nov 2016   CORONARY ANGIOPLASTY WITH STENT PLACEMENT  10-18-2003  dr Riley Kill   DES x1  to  mRCA (high-grade lesion), diffuse  diabetic abnormalities throughout the entire coronary tree w/ diffuse calcification and segmental plaquing throughout but noncritical disease   HARDWARE REMOVAL Left 04/20/2012   Procedure: Left foot removal deep retained screw;  Surgeon: Nadara Mustard, MD;  Location: [facility] OR;  Service: Orthopedics;  Laterality: Left;   MULTIPLE EXTRACTIONS WITH ALVEOLOPLASTY N/A 05/15/2015   Procedure: MULTIPLE EXTRACTIONS;  Surgeon: Filbert Berthold, DDS;  Location: [facility];  Service: Dentistry;  Laterality: N/A;   ORIF ANKLE FRACTURE  12/30/2011   Procedure: OPEN REDUCTION INTERNAL FIXATION (ORIF) ANKLE FRACTURE;  Surgeon: Nadara Mustard, MD;  Location: [facility] OR;  Service: Orthopedics;  Laterality: Left;  Excision Bone, Abscess Left Charcot Foot, Place Antibiotic Beads, Fixation Charcot   REMOVAL SUBMANDIDULAR GLAND, BILATERAL  1970's   benign    No Known Allergies   Objective/Physical Exam General: The patient is alert and oriented x3 in no acute distress.  Dermatology:  Wound #1 noted to the dorsal last of the right second toe overlying the PIPJ.  The wound measures approximately 0.3 x 0.4 x 0.2 cm (LxWxD).  There is some slight concern since the phalanx bone is not far from the wound but currently there is no obvious exposure of bone.  To the noted ulceration(s), there is no eschar. There is a moderate amount of slough, fibrin, and necrotic tissue noted. Granulation tissue and wound base is red. There is a minimal amount of serosanguineous drainage noted. There is no exposed bone muscle-tendon ligament or joint. There is no malodor. Periwound integrity is intact. Skin is warm, dry and supple bilateral lower extremities.  Vascular: VAS [place] ABI WITH/WO TBI 09/23/2022 ABI Findings:  +---------+------------------+-----+--------+--------+  Right   Rt Pressure (mmHg)IndexWaveformComment   +---------+------------------+-----+--------+--------+  Brachial 159                                       +---------+------------------+-----+--------+--------+  PTA     255               1.55 biphasic          +---------+------------------+-----+--------+--------+  DP      202               1.23 biphasic          +---------+------------------+-----+--------+--------+  Great Toe124               0.76 Normal            +---------+------------------+-----+--------+--------+   +---------+------------------+-----+-----------+-------+  Left    Lt Pressure (mmHg)IndexWaveform   Comment  +---------+------------------+-----+-----------+-------+  Brachial 164                                        +---------+------------------+-----+-----------+-------+  PTA     255               1.55 multiphasic         +---------+------------------+-----+-----------+-------+  DP      148               0.90 multiphasic         +---------+------------------+-----+-----------+-------+  Great Toe126               0.77 Normal              +---------+------------------+-----+-----------+-------+   +-------+-----------+-----------+------------+------------+  ABI/TBIToday's ABIToday's TBIPrevious ABIPrevious TBI  +-------+-----------+-----------+------------+------------+  Right [place]         0.76                                 +-------+-----------+-----------+------------+------------+  Left  [place]         0.77                                 +-------+-----------+-----------+------------+------------+  Summary:  Right: Resting right ankle-brachial index indicates noncompressible right lower extremity arteries. The right toe-brachial index is normal.  Left: Resting left ankle-brachial index indicates noncompressible left  lower extremity arteries. The left toe-brachial index is normal.   Neurological: Light touch and protective threshold absent  Musculoskeletal Exam: Nonambulatory. No prior amputations.   Assessment: 1. Ulcer right second toe secondary  to diabetes mellitus 2. diabetes mellitus w/ peripheral neuropathy 3. Peripheral vascular disease    Plan of Care:  -Patient was evaluated. -medically necessary excisional debridement including subcutaneous tissue was performed using a tissue nipper and a chisel blade. Excisional debridement of all the necrotic nonviable tissue down to healthy bleeding  viable tissue was performed with post-debridement measurements same as pre-. -the wound was cleansed and dry sterile dressing applied. -Patient has high risk of losing the toe but the wound does not heal.  For now recommend Betadine ointment daily to the wound.  Provided. -Return to clinic 3 weeks   Felecia Shelling, DPM [facility]  Dr. Felecia Shelling, DPM    [address], [place] [address]                Office [phone]  Fax [phone]

## 2022-10-10 ENCOUNTER — Other Ambulatory Visit: Payer: Self-pay

## 2022-10-10 ENCOUNTER — Emergency Department (HOSPITAL_COMMUNITY): Payer: Medicare Other

## 2022-10-10 ENCOUNTER — Inpatient Hospital Stay (HOSPITAL_COMMUNITY)
Admission: EM | Admit: 2022-10-10 | Discharge: 2022-10-14 | DRG: 392 | Disposition: A | Discharge status: Home / Self Care | Payer: Medicare Other | Attending: Internal Medicine | Admitting: Internal Medicine

## 2022-10-10 DIAGNOSIS — K5641 Fecal impaction: Secondary | ICD-10-CM | POA: Diagnosis not present

## 2022-10-10 DIAGNOSIS — G35 Multiple sclerosis: Secondary | ICD-10-CM | POA: Diagnosis present

## 2022-10-10 DIAGNOSIS — K529 Noninfective gastroenteritis and colitis, unspecified: Secondary | ICD-10-CM | POA: Diagnosis not present

## 2022-10-10 DIAGNOSIS — E08621 Diabetes mellitus due to underlying condition with foot ulcer: Secondary | ICD-10-CM

## 2022-10-10 DIAGNOSIS — E11621 Type 2 diabetes mellitus with foot ulcer: Secondary | ICD-10-CM | POA: Diagnosis not present

## 2022-10-10 DIAGNOSIS — I69354 Hemiplegia and hemiparesis following cerebral infarction affecting left non-dominant side: Secondary | ICD-10-CM

## 2022-10-10 DIAGNOSIS — Z79891 Long term (current) use of opiate analgesic: Secondary | ICD-10-CM

## 2022-10-10 DIAGNOSIS — N400 Enlarged prostate without lower urinary tract symptoms: Secondary | ICD-10-CM | POA: Diagnosis present

## 2022-10-10 DIAGNOSIS — Z79899 Other long term (current) drug therapy: Secondary | ICD-10-CM

## 2022-10-10 DIAGNOSIS — K5289 Other specified noninfective gastroenteritis and colitis: Secondary | ICD-10-CM | POA: Diagnosis present

## 2022-10-10 DIAGNOSIS — E108 Type 1 diabetes mellitus with unspecified complications: Secondary | ICD-10-CM | POA: Diagnosis not present

## 2022-10-10 DIAGNOSIS — L97512 Non-pressure chronic ulcer of other part of right foot with fat layer exposed: Secondary | ICD-10-CM | POA: Diagnosis not present

## 2022-10-10 DIAGNOSIS — E663 Overweight: Secondary | ICD-10-CM | POA: Diagnosis present

## 2022-10-10 DIAGNOSIS — E8581 Light chain (AL) amyloidosis: Secondary | ICD-10-CM | POA: Diagnosis not present

## 2022-10-10 DIAGNOSIS — R933 Abnormal findings on diagnostic imaging of other parts of digestive tract: Secondary | ICD-10-CM | POA: Diagnosis not present

## 2022-10-10 DIAGNOSIS — K5903 Drug induced constipation: Secondary | ICD-10-CM | POA: Diagnosis present

## 2022-10-10 DIAGNOSIS — I252 Old myocardial infarction: Secondary | ICD-10-CM | POA: Diagnosis not present

## 2022-10-10 DIAGNOSIS — E10621 Type 1 diabetes mellitus with foot ulcer: Secondary | ICD-10-CM | POA: Diagnosis not present

## 2022-10-10 DIAGNOSIS — K8689 Other specified diseases of pancreas: Secondary | ICD-10-CM | POA: Diagnosis not present

## 2022-10-10 DIAGNOSIS — R1031 Right lower quadrant pain: Secondary | ICD-10-CM | POA: Diagnosis not present

## 2022-10-10 DIAGNOSIS — E10649 Type 1 diabetes mellitus with hypoglycemia without coma: Secondary | ICD-10-CM | POA: Diagnosis not present

## 2022-10-10 DIAGNOSIS — F32A Depression, unspecified: Secondary | ICD-10-CM | POA: Diagnosis present

## 2022-10-10 DIAGNOSIS — E1040 Type 1 diabetes mellitus with diabetic neuropathy, unspecified: Secondary | ICD-10-CM | POA: Diagnosis present

## 2022-10-10 DIAGNOSIS — D696 Thrombocytopenia, unspecified: Secondary | ICD-10-CM

## 2022-10-10 DIAGNOSIS — Z6828 Body mass index (BMI) 28.0-28.9, adult: Secondary | ICD-10-CM

## 2022-10-10 DIAGNOSIS — R269 Unspecified abnormalities of gait and mobility: Secondary | ICD-10-CM | POA: Diagnosis not present

## 2022-10-10 DIAGNOSIS — E1022 Type 1 diabetes mellitus with diabetic chronic kidney disease: Secondary | ICD-10-CM | POA: Diagnosis present

## 2022-10-10 DIAGNOSIS — I2510 Atherosclerotic heart disease of native coronary artery without angina pectoris: Secondary | ICD-10-CM | POA: Diagnosis present

## 2022-10-10 DIAGNOSIS — R1032 Left lower quadrant pain: Secondary | ICD-10-CM | POA: Diagnosis not present

## 2022-10-10 DIAGNOSIS — Z955 Presence of coronary angioplasty implant and graft: Secondary | ICD-10-CM

## 2022-10-10 DIAGNOSIS — E7800 Pure hypercholesterolemia, unspecified: Secondary | ICD-10-CM | POA: Diagnosis not present

## 2022-10-10 DIAGNOSIS — G894 Chronic pain syndrome: Secondary | ICD-10-CM | POA: Diagnosis present

## 2022-10-10 DIAGNOSIS — I10 Essential (primary) hypertension: Secondary | ICD-10-CM | POA: Diagnosis not present

## 2022-10-10 DIAGNOSIS — K5900 Constipation, unspecified: Secondary | ICD-10-CM | POA: Diagnosis not present

## 2022-10-10 DIAGNOSIS — Z961 Presence of intraocular lens: Secondary | ICD-10-CM | POA: Diagnosis present

## 2022-10-10 DIAGNOSIS — Z794 Long term (current) use of insulin: Secondary | ICD-10-CM

## 2022-10-10 DIAGNOSIS — L97518 Non-pressure chronic ulcer of other part of right foot with other specified severity: Secondary | ICD-10-CM | POA: Diagnosis present

## 2022-10-10 DIAGNOSIS — E8809 Other disorders of plasma-protein metabolism, not elsewhere classified: Secondary | ICD-10-CM | POA: Diagnosis present

## 2022-10-10 DIAGNOSIS — E10610 Type 1 diabetes mellitus with diabetic neuropathic arthropathy: Secondary | ICD-10-CM | POA: Diagnosis present

## 2022-10-10 DIAGNOSIS — N1831 Chronic kidney disease, stage 3a: Secondary | ICD-10-CM | POA: Diagnosis not present

## 2022-10-10 DIAGNOSIS — R109 Unspecified abdominal pain: Secondary | ICD-10-CM | POA: Diagnosis not present

## 2022-10-10 DIAGNOSIS — I129 Hypertensive chronic kidney disease with stage 1 through stage 4 chronic kidney disease, or unspecified chronic kidney disease: Secondary | ICD-10-CM | POA: Diagnosis present

## 2022-10-10 DIAGNOSIS — Z87891 Personal history of nicotine dependence: Secondary | ICD-10-CM

## 2022-10-10 DIAGNOSIS — Z8249 Family history of ischemic heart disease and other diseases of the circulatory system: Secondary | ICD-10-CM

## 2022-10-10 DIAGNOSIS — L97528 Non-pressure chronic ulcer of other part of left foot with other specified severity: Secondary | ICD-10-CM | POA: Diagnosis present

## 2022-10-10 DIAGNOSIS — T402X5A Adverse effect of other opioids, initial encounter: Secondary | ICD-10-CM | POA: Diagnosis present

## 2022-10-10 DIAGNOSIS — Z713 Dietary counseling and surveillance: Secondary | ICD-10-CM

## 2022-10-10 DIAGNOSIS — Z7982 Long term (current) use of aspirin: Secondary | ICD-10-CM

## 2022-10-10 DIAGNOSIS — Z7902 Long term (current) use of antithrombotics/antiplatelets: Secondary | ICD-10-CM

## 2022-10-10 DIAGNOSIS — K8020 Calculus of gallbladder without cholecystitis without obstruction: Secondary | ICD-10-CM | POA: Diagnosis not present

## 2022-10-10 LAB — COMPREHENSIVE METABOLIC PANEL
ALT: 26 U/L (ref 0–44)
AST: 37 U/L (ref 15–41)
Albumin: 3.2 g/dL — ABNORMAL LOW (ref 3.5–5.0)
Alkaline Phosphatase: 239 U/L — ABNORMAL HIGH (ref 38–126)
Anion gap: 11 (ref 5–15)
BUN: 33 mg/dL — ABNORMAL HIGH (ref 8–23)
CO2: 24 mmol/L (ref 22–32)
Calcium: 8.8 mg/dL — ABNORMAL LOW (ref 8.9–10.3)
Chloride: 101 mmol/L (ref 98–111)
Creatinine, Ser: 1.5 mg/dL — ABNORMAL HIGH (ref 0.61–1.24)
GFR, Estimated: 48 mL/min — ABNORMAL LOW (ref >60)
Glucose, Bld: 157 mg/dL — ABNORMAL HIGH (ref 70–99)
Potassium: 4.3 mmol/L (ref 3.5–5.1)
Sodium: 136 mmol/L (ref 135–145)
Total Bilirubin: 1 mg/dL (ref 0.3–1.2)
Total Protein: 8.3 g/dL — ABNORMAL HIGH (ref 6.5–8.1)

## 2022-10-10 LAB — CBC WITH DIFFERENTIAL/PLATELET
Abs Immature Granulocytes: 0.05 10*3/uL (ref 0.00–0.07)
Basophils Absolute: 0.1 10*3/uL (ref 0.0–0.1)
Basophils Relative: 0 %
Eosinophils Absolute: 0 10*3/uL (ref 0.0–0.5)
Eosinophils Relative: 0 %
HCT: 46.7 % (ref 39.0–52.0)
Hemoglobin: 15.8 g/dL (ref 13.0–17.0)
Immature Granulocytes: 0 %
Lymphocytes Relative: 10 %
Lymphs Abs: 1.4 10*3/uL (ref 0.7–4.0)
MCH: 33.7 pg (ref 26.0–34.0)
MCHC: 33.8 g/dL (ref 30.0–36.0)
MCV: 99.6 fL (ref 80.0–100.0)
Monocytes Absolute: 0.8 10*3/uL (ref 0.1–1.0)
Monocytes Relative: 6 %
Neutro Abs: 11.4 10*3/uL — ABNORMAL HIGH (ref 1.7–7.7)
Neutrophils Relative %: 84 %
Platelets: 104 10*3/uL — ABNORMAL LOW (ref 150–400)
RBC: 4.69 MIL/uL (ref 4.22–5.81)
RDW: 12.9 % (ref 11.5–15.5)
WBC: 13.7 10*3/uL — ABNORMAL HIGH (ref 4.0–10.5)
nRBC: 0 % (ref 0.0–0.2)

## 2022-10-10 LAB — URINALYSIS, ROUTINE W REFLEX MICROSCOPIC
Bacteria, UA: NONE SEEN
Bilirubin Urine: NEGATIVE
Glucose, UA: 50 mg/dL — AB
Hgb urine dipstick: NEGATIVE
Ketones, ur: 5 mg/dL — AB
Leukocytes,Ua: NEGATIVE
Nitrite: NEGATIVE
Protein, ur: 100 mg/dL — AB
Specific Gravity, Urine: 1.023 (ref 1.005–1.030)
pH: 7 (ref 5.0–8.0)

## 2022-10-10 LAB — CBG MONITORING, ED: Glucose-Capillary: 89 mg/dL (ref 70–99)

## 2022-10-10 LAB — LIPASE, BLOOD: Lipase: 20 U/L (ref 11–51)

## 2022-10-10 MED ORDER — IOHEXOL 300 MG/ML  SOLN
100.0000 mL | Freq: Once | INTRAMUSCULAR | Status: AC | PRN
  Administered 2022-10-10: 100 mL via INTRAVENOUS

## 2022-10-10 MED ORDER — AMLODIPINE BESYLATE 5 MG PO TABS
5.0000 mg | ORAL_TABLET | Freq: Every morning | ORAL | Status: DC
  Administered 2022-10-11 – 2022-10-14 (×4): 5 mg via ORAL
  Filled 2022-10-10 (×4): qty 1

## 2022-10-10 MED ORDER — ONDANSETRON HCL 4 MG/2ML IJ SOLN
4.0000 mg | Freq: Once | INTRAMUSCULAR | Status: AC
  Administered 2022-10-10: 4 mg via INTRAVENOUS
  Filled 2022-10-10: qty 2

## 2022-10-10 MED ORDER — NALOXEGOL OXALATE 12.5 MG PO TABS
12.5000 mg | ORAL_TABLET | Freq: Every day | ORAL | Status: AC
  Administered 2022-10-11 – 2022-10-12 (×3): 12.5 mg via ORAL
  Filled 2022-10-10 (×3): qty 1

## 2022-10-10 MED ORDER — FINASTERIDE 5 MG PO TABS
5.0000 mg | ORAL_TABLET | Freq: Every morning | ORAL | Status: DC
  Administered 2022-10-11 – 2022-10-14 (×4): 5 mg via ORAL
  Filled 2022-10-10 (×4): qty 1

## 2022-10-10 MED ORDER — CLOPIDOGREL BISULFATE 75 MG PO TABS
75.0000 mg | ORAL_TABLET | Freq: Every day | ORAL | Status: DC
  Administered 2022-10-11 – 2022-10-14 (×4): 75 mg via ORAL
  Filled 2022-10-10 (×4): qty 1

## 2022-10-10 MED ORDER — TAPENTADOL HCL ER 100 MG PO TB12
200.0000 mg | ORAL_TABLET | Freq: Two times a day (BID) | ORAL | Status: DC
  Administered 2022-10-11 – 2022-10-14 (×8): 200 mg via ORAL
  Filled 2022-10-10 (×9): qty 2

## 2022-10-10 MED ORDER — METRONIDAZOLE 500 MG PO TABS
500.0000 mg | ORAL_TABLET | Freq: Two times a day (BID) | ORAL | Status: DC
  Administered 2022-10-11 – 2022-10-14 (×7): 500 mg via ORAL
  Filled 2022-10-10 (×8): qty 1

## 2022-10-10 MED ORDER — MELATONIN 5 MG PO TABS
10.0000 mg | ORAL_TABLET | Freq: Every evening | ORAL | Status: DC | PRN
  Administered 2022-10-11 – 2022-10-12 (×2): 10 mg via ORAL
  Filled 2022-10-10 (×2): qty 2

## 2022-10-10 MED ORDER — ATORVASTATIN CALCIUM 10 MG PO TABS
10.0000 mg | ORAL_TABLET | Freq: Every morning | ORAL | Status: DC
  Administered 2022-10-11 – 2022-10-14 (×4): 10 mg via ORAL
  Filled 2022-10-10 (×4): qty 1

## 2022-10-10 MED ORDER — MORPHINE SULFATE (PF) 4 MG/ML IV SOLN
4.0000 mg | Freq: Once | INTRAVENOUS | Status: AC
  Administered 2022-10-10: 4 mg via INTRAVENOUS
  Filled 2022-10-10: qty 1

## 2022-10-10 MED ORDER — OXYCODONE HCL 5 MG PO TABS
15.0000 mg | ORAL_TABLET | Freq: Four times a day (QID) | ORAL | Status: DC | PRN
  Administered 2022-10-10 – 2022-10-14 (×12): 15 mg via ORAL
  Filled 2022-10-10 (×12): qty 3

## 2022-10-10 MED ORDER — ACETAMINOPHEN 325 MG PO TABS
650.0000 mg | ORAL_TABLET | Freq: Four times a day (QID) | ORAL | Status: DC | PRN
  Filled 2022-10-10: qty 2

## 2022-10-10 MED ORDER — SORBITOL 70 % SOLN
400.0000 mL | TOPICAL_OIL | ORAL | Status: AC
  Administered 2022-10-11 (×2): 400 mL via RECTAL
  Filled 2022-10-10 (×4): qty 120

## 2022-10-10 MED ORDER — INSULIN ASPART 100 UNIT/ML IJ SOLN
0.0000 [IU] | Freq: Three times a day (TID) | INTRAMUSCULAR | Status: DC
  Administered 2022-10-11: 2 [IU] via SUBCUTANEOUS
  Administered 2022-10-11: 1 [IU] via SUBCUTANEOUS
  Administered 2022-10-11 – 2022-10-12 (×2): 2 [IU] via SUBCUTANEOUS
  Administered 2022-10-13: 9 [IU] via SUBCUTANEOUS
  Administered 2022-10-13 – 2022-10-14 (×2): 3 [IU] via SUBCUTANEOUS
  Administered 2022-10-14: 7 [IU] via SUBCUTANEOUS
  Filled 2022-10-10: qty 0.09

## 2022-10-10 MED ORDER — TRAZODONE HCL 50 MG PO TABS
150.0000 mg | ORAL_TABLET | Freq: Every day | ORAL | Status: DC
  Administered 2022-10-10 – 2022-10-13 (×4): 150 mg via ORAL
  Filled 2022-10-10 (×2): qty 1
  Filled 2022-10-10: qty 2
  Filled 2022-10-10: qty 1

## 2022-10-10 MED ORDER — INSULIN ISOPHANE HUMAN 100 UNIT/ML KWIKPEN: 10.0000 [IU] | PEN_INJECTOR | Freq: Two times a day (BID) | SUBCUTANEOUS | Status: DC

## 2022-10-10 MED ORDER — ACETAMINOPHEN 650 MG RE SUPP: 650.0000 mg | Freq: Four times a day (QID) | RECTAL | Status: DC | PRN

## 2022-10-10 MED ORDER — ASPIRIN 81 MG PO CHEW
81.0000 mg | CHEWABLE_TABLET | Freq: Every day | ORAL | Status: DC
  Administered 2022-10-11 – 2022-10-14 (×4): 81 mg via ORAL
  Filled 2022-10-10 (×4): qty 1

## 2022-10-10 MED ORDER — DULOXETINE HCL 60 MG PO CPEP
60.0000 mg | ORAL_CAPSULE | Freq: Two times a day (BID) | ORAL | Status: DC
  Administered 2022-10-10 – 2022-10-14 (×8): 60 mg via ORAL
  Filled 2022-10-10: qty 2
  Filled 2022-10-10 (×7): qty 1

## 2022-10-10 MED ORDER — INSULIN NPH (HUMAN) (ISOPHANE) 100 UNIT/ML ~~LOC~~ SUSP
30.0000 [IU] | Freq: Every day | SUBCUTANEOUS | Status: DC
  Administered 2022-10-11 – 2022-10-13 (×3): 30 [IU] via SUBCUTANEOUS

## 2022-10-10 MED ORDER — INSULIN NPH (HUMAN) (ISOPHANE) 100 UNIT/ML ~~LOC~~ SUSP
15.0000 [IU] | Freq: Every day | SUBCUTANEOUS | Status: DC
  Administered 2022-10-11 – 2022-10-12 (×3): 15 [IU] via SUBCUTANEOUS
  Filled 2022-10-10: qty 10

## 2022-10-10 MED ORDER — INSULIN ASPART 100 UNIT/ML IJ SOLN
0.0000 [IU] | Freq: Every day | INTRAMUSCULAR | Status: DC
  Administered 2022-10-11: 2 [IU] via SUBCUTANEOUS
  Administered 2022-10-13: 5 [IU] via SUBCUTANEOUS
  Filled 2022-10-10: qty 0.05

## 2022-10-10 MED ORDER — ONDANSETRON HCL 4 MG PO TABS: 4.0000 mg | ORAL_TABLET | Freq: Four times a day (QID) | ORAL | Status: DC | PRN

## 2022-10-10 MED ORDER — BISACODYL 5 MG PO TBEC
10.0000 mg | DELAYED_RELEASE_TABLET | Freq: Every day | ORAL | Status: DC
  Administered 2022-10-11 – 2022-10-14 (×5): 10 mg via ORAL
  Filled 2022-10-10 (×5): qty 2

## 2022-10-10 MED ORDER — METOPROLOL TARTRATE 25 MG PO TABS
25.0000 mg | ORAL_TABLET | Freq: Two times a day (BID) | ORAL | Status: DC
  Administered 2022-10-10 – 2022-10-14 (×8): 25 mg via ORAL
  Filled 2022-10-10 (×8): qty 1

## 2022-10-10 MED ORDER — ONDANSETRON HCL 4 MG/2ML IJ SOLN: 4.0000 mg | Freq: Four times a day (QID) | INTRAMUSCULAR | Status: DC | PRN

## 2022-10-10 MED ORDER — POLYETHYLENE GLYCOL 3350 17 G PO PACK
34.0000 g | PACK | Freq: Every day | ORAL | Status: DC
  Administered 2022-10-11 – 2022-10-14 (×5): 34 g via ORAL
  Filled 2022-10-10 (×5): qty 2

## 2022-10-10 NOTE — Assessment & Plan Note (Signed)
Stable. Not on any specific therapy.

## 2022-10-10 NOTE — Assessment & Plan Note (Addendum)
Pt seen by podiatry 4 days ago and had debridement of his ulcer. Podiatry did not think his ulcer was infected. Neither do I. Will not continue abx started by EDP.   Pt's foot appears improved compared to picture taken in podiatry office on 10-05-2022.

## 2022-10-10 NOTE — Assessment & Plan Note (Signed)
On chronic Nucynta and oxycodone. His chronic opiate therapy a cause of his chronic constipation. Add po movantik

## 2022-10-10 NOTE — Assessment & Plan Note (Signed)
Admit to obs med/surg bed. Continue with po miralax, SMOG enemas, dulcolax. Add movantik as pt on chronic long term opiates. Add po flagyl for stercoral colitis. Keep on clear liquid diet. Consider repeat CT abd/pelvis without contrast to document resolution of fecal impaction. Pt states he has been having bowel movements this week. He's probably leaking around this gigantic stool ball in his rectum.

## 2022-10-10 NOTE — Assessment & Plan Note (Signed)
Pt able to stand and transfer. Walks with assistance a short distance to kitchen(no more than 20 feet).

## 2022-10-10 NOTE — ED Provider Notes (Signed)
[place] EMERGENCY DEPARTMENT AT [facility] Provider Note   CSN: [id number] Arrival date & time: 10/10/22  1535     History {Add pertinent medical, surgical, social history, OB history to HPI:1} Chief Complaint  Patient presents with   Constipation    [name]. is a 77 y.o. male.  77 year old male with past medical history of diabetes, MS, coronary artery disease, and CVA presenting to the emergency department today with abdominal pain.  The patient states that he has not had a bowel movement in 2 days which is abnormal for him.  He states that he is still passing flatus.  He states that he is developing left-sided abdominal pain.  He reports some nausea but denies any associated vomiting.  He reports decreased appetite with this.  He did try an enema today as well as some oral over-the-counter medications which did not help and his pain was worsening so he came to the emergency department for further evaluation.  The patient denies any fevers.  Prior to this he denies any blood in his stool or dark stools.  He denies any history of abdominal surgeries.   Constipation Associated symptoms: abdominal pain and nausea        Home Medications Prior to Admission medications   Medication Sig Start Date End Date Taking? Authorizing Provider  amLODipine (NORVASC) 5 MG tablet Take 5 mg by mouth every morning.     [provider]  aspirin 81 MG tablet Take 81 mg by mouth daily.    [provider]  atorvastatin (LIPITOR) 10 MG tablet Take 10 mg by mouth every morning.     [provider]  clopidogrel (PLAVIX) 75 MG tablet Take 75 mg by mouth daily.    [provider]  doxycycline (VIBRA-TABS) 100 MG tablet Take 1 tablet (100 mg total) by mouth 2 (two) times daily. 09/21/22   Helane Gunther, DPM  DULoxetine (CYMBALTA) 60 MG capsule Take 60 mg by mouth 2 (two) times daily.    [provider]  finasteride (PROSCAR) 5 MG tablet Take  5 mg by mouth every morning.     [provider]  insulin aspart (NOVOLOG FLEXPEN) 100 UNIT/ML FlexPen Inject into the skin 2 (two) times daily. Sliding scale    [provider]  Insulin NPH, Human,, Isophane, (HUMULIN N KWIKPEN) 100 UNIT/ML Kiwkpen Inject into the skin 2 (two) times daily. 38-39 units in AM  And 18-19  units in PM    [provider]  metoprolol tartrate (LOPRESSOR) 25 MG tablet Take 25 mg by mouth 2 (two) times daily.    [provider]  Multiple Vitamin (MULTIVITAMIN WITH MINERALS) TABS tablet Take 1 tablet by mouth daily.    [provider]  nitroGLYCERIN (NITRO-DUR) 0.4 mg/hr patch Apply patch to the white flesh of the toe daily. 07/04/17   Helane Gunther, DPM  nitroGLYCERIN (NITROSTAT) 0.4 MG SL tablet Place 0.4 mg under the tongue every 5 (five) minutes as needed for chest pain (x 3 doses). For chest pain    [provider]  oxyCODONE (ROXICODONE) 15 MG immediate release tablet Take 1 tablet (15 mg total) by mouth every 6 (six) hours as needed for pain. 04/01/22     oxyCODONE (ROXICODONE) 15 MG immediate release tablet Take 1 tablet (15 mg total) by mouth every 6 (six) hours as needed for pain. 03/04/22     oxyCODONE (ROXICODONE) 15 MG immediate release tablet Take 1 tablet (15 mg total)  by mouth every 6 (six) hours as needed for pain. 05/03/22     oxyCODONE (ROXICODONE) 15 MG immediate release tablet Take 1 tablet (15 mg total) by mouth every 6 (six) hours as needed for pain. 06/01/22     oxyCODONE (ROXICODONE) 15 MG immediate release tablet Take 1 tablet (15 mg total) by mouth every 6 (six) hours as needed for pain. 07/28/22     oxyCODONE (ROXICODONE) 15 MG immediate release tablet Take 1 tablet (15 mg total) by mouth every 6 (six) hours as needed for pain. 06/29/22     oxyCODONE (ROXICODONE) 15 MG immediate release tablet Take 1 tablet (15 mg total) by mouth every 6 (six) hours as needed for pain. 08/31/22     oxyCODONE  (ROXICODONE) 15 MG immediate release tablet Take 1 tablet (15 mg total) by mouth every 6 (six) hours as needed for pain. 09/29/22     oxycodone (ROXICODONE) 30 MG immediate release tablet Take 30 mg by mouth 6 (six) times daily.    [provider]  polyethylene glycol (MIRALAX / GLYCOLAX) packet Take 17 g by mouth daily.    [provider]  Tapentadol HCl (NUCYNTA ER) 200 MG TB12 Take 200 mg by mouth every 12 (twelve) hours. 03/04/22     Tapentadol HCl (NUCYNTA ER) 200 MG TB12 Take 200 mg by mouth every 12 (twelve) hours. 04/01/22     Tapentadol HCl (NUCYNTA ER) 200 MG TB12 Take 200 mg by mouth every 12 (twelve) hours. 06/01/22     Tapentadol HCl (NUCYNTA ER) 200 MG TB12 Take 200 mg by mouth every 12 (twelve) hours. 05/03/22     Tapentadol HCl (NUCYNTA ER) 200 MG TB12 Take 200 mg by mouth every 12 (twelve) hours. 06/29/22     Tapentadol HCl (NUCYNTA ER) 200 MG TB12 Take 200 mg by mouth every 12 (twelve) hours. 07/28/22     Tapentadol HCl (NUCYNTA ER) 200 MG TB12 Take 1 tablet (200 mg total) by mouth every 12 (twelve) hours. 09/29/22     Tapentadol HCl (NUCYNTA ER) 200 MG TB12 Take 1 tablet (200 mg total) by mouth every 12 (twelve) hours. 08/31/22     Tapentadol HCl (NUCYNTA ER) 250 MG TB12 Take 1 tablet by mouth 2 (two) times daily.    [provider]  traZODone (DESYREL) 150 MG tablet Take 150 mg by mouth at bedtime.    [provider]  Vitamins/Minerals TABS Take by mouth.    [provider]      Allergies    Patient has no known allergies.    Review of Systems   Review of Systems  Gastrointestinal:  Positive for abdominal pain, constipation and nausea.  All other systems reviewed and are negative.   Physical Exam Updated Vital Signs BP (!) 165/76   Pulse 80   Temp 97.9 F (36.6 C) (Oral)   Resp 18   Ht 6' (1.829 m)   Wt 95.3 kg   SpO2 96%   BMI 28.48 kg/m  Physical Exam Vitals and nursing note reviewed.   Gen: Chronically ill-appearing,  NAD Eyes: PERRL, EOMI HEENT: no oropharyngeal swelling Neck: trachea midline Resp: clear to auscultation bilaterally Card: RRR, no murmurs, rubs, or gallops Abd: The patient's abdomen is mildly distended, he is diffusely tender with maximal tenderness over the left periumbilical region left lower quadrant without guarding or rebound Extremities: Patient has erythema over the second toe on the right foot with a small open wound noted Vascular: 2+ radial pulses bilaterally,  2+ DP pulses bilaterally Skin: no rashes Psyc: acting appropriately   ED Results / Procedures / Treatments   Labs (all labs ordered are listed, but only abnormal results are displayed) Labs Reviewed  COMPREHENSIVE METABOLIC PANEL  LIPASE, BLOOD  CBC WITH DIFFERENTIAL/PLATELET  URINALYSIS, ROUTINE W REFLEX MICROSCOPIC    EKG None  Radiology No results found.  Procedures Procedures  {Document cardiac monitor, telemetry assessment procedure when appropriate:1}  Medications Ordered in ED Medications - No data to display  ED Course/ Medical Decision Making/ A&P   {   Click here for ABCD2, HEART and other calculatorsREFRESH Note before signing :1}                              Medical Decision Making 77 year old male with past medical history of diabetes, coronary artery disease, CVA, and MS presenting to the emergency department today with complaints of abdominal pain and nausea.  I will further evaluate patient here with basic labs including LFTs and lipase to evaluate for hepatobiliary pathology or pancreatitis.  Also obtain a urinalysis here to eval for urinary tract infection or pyelonephritis.  I will obtain a CT scan of his abdomen to evaluate for colitis, diverticulitis, perforated viscus, bowel obstruction, or other intra-abdominal pathology.  Also obtain an x-ray of the patient's foot to evaluate for underlying osteomyelitis.  There are some mild erythema.  He states that he has not followed up with  podiatry regarding this.  This is not appear to be a severe diabetic foot wound.  I will reevaluate for ultimate disposition.  Amount and/or Complexity of Data Reviewed Labs: ordered. Radiology: ordered.   ***  {Document critical care time when appropriate:1} {Document review of labs and clinical decision tools ie heart score, Chads2Vasc2 etc:1}  {Document your independent review of radiology images, and any outside records:1} {Document your discussion with family members, caretakers, and with consultants:1} {Document social determinants of health affecting pt's care:1} {Document your decision making why or why not admission, treatments were needed:1} Final Clinical Impression(s) / ED Diagnoses Final diagnoses:  None    Rx / DC Orders ED Discharge Orders     None

## 2022-10-10 NOTE — Assessment & Plan Note (Signed)
Continue with lipitor 10 mg daily.

## 2022-10-10 NOTE — Assessment & Plan Note (Signed)
Continue with insulin.

## 2022-10-10 NOTE — Assessment & Plan Note (Signed)
Chronic. Pt not on any specific therapy for his MS.

## 2022-10-10 NOTE — Assessment & Plan Note (Signed)
Unclear the cause of his low platelet count. Will place on SCDs for DVT prophylaxis.

## 2022-10-10 NOTE — Assessment & Plan Note (Signed)
Stable. Baseline Scr 1.3

## 2022-10-10 NOTE — ED Triage Notes (Signed)
Patient brought in from home by EMS with c/o constipation. Patient reports taking Mag to help with BM and stool softeners but has not had any relief. He denies N/V but reports being able to pass gas. He rates ABD discomfort 10/10.  142/80 100 98% RA CBG: 236

## 2022-10-10 NOTE — Assessment & Plan Note (Signed)
Stable on norvasc and lopressor.

## 2022-10-10 NOTE — ED Notes (Signed)
ED TO INPATIENT HANDOFF REPORT  ED Nurse Name and Phone #: Linus Orn Name/Age/Gender [name] Lemon Casasola Jr. 77 y.o. male Room/Bed: WA15/WA15  Code Status   Code Status: Full Code  Home/SNF/Other Home Patient oriented to: [place], place, time, and situation Is this baseline? Yes   Triage Complete: Triage complete  Chief Complaint Fecal impaction [facility]) [K56.41]  Triage Note Patient brought in from home by EMS with c/o constipation. Patient reports taking Mag to help with BM and stool softeners but has not had any relief. He denies N/V but reports being able to pass gas. He rates ABD discomfort 10/10.  142/80 100 98% RA CBG: 236   Allergies No Known Allergies  Level of Care/Admitting Diagnosis ED Disposition     ED Disposition  Admit   Condition  --   Comment  Hospital Area: [facility] [[id number]]  Level of Care: Med-Surg [16]  May place patient in observation at [facility] or [facility] if equivalent level of care is available:: No  Covid Evaluation: Asymptomatic - no recent exposure (last 10 days) testing not required  Diagnosis: Fecal impaction [facility]) [[id number]]  Admitting Physician: Imogene Burn, ERIC [[id number]]  Attending Physician: Imogene Burn, ERIC [[id number]]          B Medical/Surgery History Past Medical History:  Diagnosis Date   Arthritis    Benign prostatic hypertrophy with hesitancy    Charcot's arthropathy, diabetic (HCC)    left foot   Chronic pain syndrome followed by dr mark phillps at pain clinic   secondary to MS and diabetic neuropathy   Chronic periodontal disease    CKD (chronic kidney disease), stage II    hypertensive per PCP note   Coronary artery disease hx MI  s/p  DES to [facility] 2005   hx cardiologist-- dr Riley Kill until he retired,  now followed by pcp , dr Jacky Kindle   Depression    Diabetic neuropathy [facility])    Essential tremor    Gait disorder    uses walker   History of CVA (cerebrovascular accident)    2011-  right subcortical cva--   residual left mininmal side weakness and no use of left head   History of CVA with residual deficit    2011  --  right subcortical cva w/ mininmal left hemiparesis   History of diabetic ulcer of foot    2010  left foot   History of MI (myocardial infarction)    2005   Hypertension    dr Jacky Kindle   MS (multiple sclerosis) (HCC)    dx 1985   PONV (postoperative nausea and vomiting)    S/P drug eluting coronary stent placement    2005  to midRCA   Type 1 diabetes mellitus on insulin therapy [facility])    Past Surgical History:  Procedure Laterality Date   CATARACT EXTRACTION W/ INTRAOCULAR LENS  IMPLANT, BILATERAL  Nov 2016   CORONARY ANGIOPLASTY WITH STENT PLACEMENT  10-18-2003  dr Riley Kill   DES x1  to  mRCA (high-grade lesion), diffuse diabetic abnormalities throughout the entire coronary tree w/ diffuse calcification and segmental plaquing throughout but noncritical disease   HARDWARE REMOVAL Left 04/20/2012   Procedure: Left foot removal deep retained screw;  Surgeon: Nadara Mustard, MD;  Location: [facility] OR;  Service: Orthopedics;  Laterality: Left;   MULTIPLE EXTRACTIONS WITH ALVEOLOPLASTY N/A 05/15/2015   Procedure: MULTIPLE EXTRACTIONS;  Surgeon: Filbert Berthold, DDS;  Location: [facility];  Service: Dentistry;  Laterality: N/A;  ORIF ANKLE FRACTURE  12/30/2011   Procedure: OPEN REDUCTION INTERNAL FIXATION (ORIF) ANKLE FRACTURE;  Surgeon: Nadara Mustard, MD;  Location: [facility] OR;  Service: Orthopedics;  Laterality: Left;  Excision Bone, Abscess Left Charcot Foot, Place Antibiotic Beads, Fixation Charcot   REMOVAL SUBMANDIDULAR GLAND, BILATERAL  1970's   benign     A IV Location/Drains/Wounds Patient Lines/Drains/Airways Status     Active Line/Drains/Airways     Name Placement date Placement time Site Days   Peripheral IV 10/10/22 22 G 1" Anterior;Left;Proximal Forearm 10/10/22  1723  Forearm  less than 1   Pressure Ulcer 04/21/13 Stage II -  Partial thickness loss of dermis  presenting as a shallow open ulcer with a red, pink wound bed without slough. 04/21/13  --  -- 3459   Wound / Incision (Open or Dehisced) 04/21/13 Other (Comment) Neck Left scabbed abrasion 04/21/13  --  Neck  3459            Intake/Output Last 24 hours No intake or output data in the 24 hours ending 10/10/22 2317  Labs/Imaging Results for orders placed or performed during the hospital encounter of 10/10/22 (from the past 48 hour(s))  Comprehensive metabolic panel     Status: Abnormal   Collection Time: 10/10/22  4:45 PM  Result Value Ref Range   Sodium 136 135 - 145 mmol/L   Potassium 4.3 3.5 - 5.1 mmol/L   Chloride 101 98 - 111 mmol/L   CO2 24 22 - 32 mmol/L   Glucose, Bld 157 (H) 70 - 99 mg/dL    Comment: Glucose reference range applies only to samples taken after fasting for at least 8 hours.   BUN 33 (H) 8 - 23 mg/dL   Creatinine, Ser 8.41 (H) 0.61 - 1.24 mg/dL   Calcium 8.8 (L) 8.9 - 10.3 mg/dL   Total Protein 8.3 (H) 6.5 - 8.1 g/dL   Albumin 3.2 (L) 3.5 - 5.0 g/dL   AST 37 15 - 41 U/L   ALT 26 0 - 44 U/L   Alkaline Phosphatase 239 (H) 38 - 126 U/L   Total Bilirubin 1.0 0.3 - 1.2 mg/dL   GFR, Estimated 48 (L) >60 mL/min    Comment: (NOTE) Calculated using the CKD-EPI Creatinine Equation (2021)    Anion gap 11 5 - 15    Comment: Performed at [facility], [address] [place], [place] [address]  Lipase, blood     Status: None   Collection Time: 10/10/22  4:45 PM  Result Value Ref Range   Lipase 20 11 - 51 U/L    Comment: Performed at [facility], [address] [place], [place] [address]  CBC with Diff     Status: Abnormal   Collection Time: 10/10/22  4:45 PM  Result Value Ref Range   WBC 13.7 (H) 4.0 - 10.5 K/uL   RBC 4.69 4.22 - 5.81 MIL/uL   Hemoglobin 15.8 13.0 - 17.0 g/dL   HCT 09.3 23.5 - 57.3 %   MCV 99.6 80.0 - 100.0 fL   MCH 33.7 26.0 - 34.0 pg   MCHC 33.8 30.0 - 36.0 g/dL   RDW 22.0 25.4 - 27.0 %    Platelets 104 (L) 150 - 400 K/uL   nRBC 0.0 0.0 - 0.2 %   Neutrophils Relative % 84 %   Neutro Abs 11.4 (H) 1.7 - 7.7 K/uL   Lymphocytes Relative 10 %   Lymphs Abs 1.4 0.7 - 4.0 K/uL  Monocytes Relative 6 %   Monocytes Absolute 0.8 0.1 - 1.0 K/uL   Eosinophils Relative 0 %   Eosinophils Absolute 0.0 0.0 - 0.5 K/uL   Basophils Relative 0 %   Basophils Absolute 0.1 0.0 - 0.1 K/uL   Immature Granulocytes 0 %   Abs Immature Granulocytes 0.05 0.00 - 0.07 K/uL    Comment: Performed at [facility], [address] [place], [place] [address]  Urinalysis, Routine w reflex microscopic -Urine, Clean Catch     Status: Abnormal   Collection Time: 10/10/22  9:26 PM  Result Value Ref Range   Color, Urine YELLOW YELLOW   APPearance CLEAR CLEAR   Specific Gravity, Urine 1.023 1.005 - 1.030   pH 7.0 5.0 - 8.0   Glucose, UA 50 (A) NEGATIVE mg/dL   Hgb urine dipstick NEGATIVE NEGATIVE   Bilirubin Urine NEGATIVE NEGATIVE   Ketones, ur 5 (A) NEGATIVE mg/dL   Protein, ur 253 (A) NEGATIVE mg/dL   Nitrite NEGATIVE NEGATIVE   Leukocytes,Ua NEGATIVE NEGATIVE   RBC / HPF 0-5 0 - 5 RBC/hpf   WBC, UA 0-5 0 - 5 WBC/hpf   Bacteria, UA NONE SEEN NONE SEEN   Squamous Epithelial / HPF 0-5 0 - 5 /HPF   Hyaline Casts, UA PRESENT     Comment: Performed at [facility], [address] [place], [place] [address]  CBG monitoring, ED     Status: None   Collection Time: 10/10/22 10:28 PM  Result Value Ref Range   Glucose-Capillary 89 70 - 99 mg/dL    Comment: Glucose reference range applies only to samples taken after fasting for at least 8 hours.   CT ABDOMEN PELVIS W CONTRAST  Result Date: 10/10/2022 CLINICAL DATA:  clinical constipation.  Abdominal discomfort. EXAM: CT ABDOMEN AND PELVIS WITH CONTRAST TECHNIQUE: Multidetector CT imaging of the abdomen and pelvis was performed using the standard protocol following bolus administration of intravenous contrast. RADIATION DOSE  REDUCTION: This exam was performed according to the departmental dose-optimization program which includes automated exposure control, adjustment of the mA and/or kV according to patient size and/or use of iterative reconstruction technique. CONTRAST:  OMNIPAQUE IOHEXOL 300 MG/ML  SOLN COMPARISON:  None Available. FINDINGS: Lower chest: Centrilobular and paraseptal emphysema. Right lower lobe calcified granulomas. Motion degraded evaluation of the lung bases. 5 mm left lower lobe pulmonary nodule on 08/05 is most likely a subpleural lymph node adjacent the left major fissure. No specific imaging follow-up indicated per Fleischner criteria. Normal heart size without pericardial or pleural effusion. Three vessel coronary artery calcification. Hepatobiliary: Normal liver. Small dependent gallstones without acute cholecystitis or biliary duct dilatation. Pancreas: The pancreas is markedly atrophic/fatty replaced. Spleen: Normal in size, without focal abnormality. Adrenals/Urinary Tract: Normal adrenal glands. Mild renal cortical thinning bilaterally. Bilateral too small to characterize renal lesions are most likely cysts . In the absence of clinically indicated signs/symptoms require(s) no independent follow-up. No hydronephrosis. Normal urinary bladder. Stomach/Bowel: Normal stomach, without wall thickening. 7.6 cm stool ball in the rectum. Mild perirectal edema identified posteriorly on 78/2. No wall thickening. Large stool burden identified more proximally, including within the sigmoid colon. Normal terminal ileum. The appendix is upper normal sized on 62/2 but no periappendiceal inflammation is seen. Normal small bowel. Vascular/Lymphatic: Advanced aortic and branch vessel atherosclerosis. No abdominopelvic adenopathy. Reproductive: Normal prostate. Other: No significant free fluid.  No free intraperitoneal air. Musculoskeletal: Right greater than left hip osteoarthritis. Lower thoracic and upper lumbar  degenerative disc disease. IMPRESSION: 1. Rectal stool ball is most consistent with fecal impaction. Mild perirectal edema suggests stercoral proctitis. 2. Motion degradation, primarily involving the lung bases. 3. Cholelithiasis 4. Markedly atrophic pancreas 5. Aortic atherosclerosis (ICD10-I70.0), coronary artery atherosclerosis and emphysema (ICD10-J43.9). Electronically Signed   By: Jeronimo Greaves M.D.   On: 10/10/2022 18:56    Pending Labs [employer] (From admission, onward)     Start     Ordered   Signed and Held  Hemoglobin A1c  Once,   R       Comments: To assess prior glycemic control    Signed and Held            Vitals/Pain Today's Vitals   10/10/22 1555 10/10/22 1600 10/10/22 1835 10/10/22 1925  BP:  (!) 165/76  (!) 147/106  Pulse:  80  82  Resp:  18  18  Temp:    98.1 F (36.7 C)  TempSrc:      SpO2: 98% 96%  100%  Weight:      Height:      PainSc:   5      Isolation Precautions No active isolations  Medications Medications  tapentadol (NUCYNTA) 12 hr tablet 200 mg (has no administration in time range)  iohexol (OMNIPAQUE) 300 MG/ML solution 100 mL (100 mLs Intravenous Contrast Given 10/10/22 1728)  morphine (PF) 4 MG/ML injection 4 mg (4 mg Intravenous Given 10/10/22 1813)  ondansetron (ZOFRAN) injection 4 mg (4 mg Intravenous Given 10/10/22 1813)    Mobility non-ambulatory     Focused Assessments GI assessment   R Recommendations: See Admitting Provider Note  Report given to:   Additional Notes: aaox4, pt has MS cannot ambulate can stand an pivot with assistance.

## 2022-10-10 NOTE — H&P (Signed)
History and Physical    [name] M Rogacki Jr. EXH:[id number] DOB: [birth year] DOA: 10/10/2022  DOS: the patient was seen and examined on 10/10/2022  PCP: Geoffry Paradise, MD   Patient coming from: Home  I have personally briefly reviewed patient's old medical records in [facility]  CC: constipation HPI: 77 year old Caucasian male history of multiple sclerosis, hypertension, type 1 diabetes since the age of 58, chronic kidney disease stage IIIa baseline creatinine 1.3, prior history of stroke, chronic pain syndrome on Nucynta and oxycodone, hyperlipidemia, amyloidosis, presents to the ER today with constipation and abdominal pain.  Patient states that he has chronic constipation.  He is on multiple narcotics.  He states that he takes MiraLAX almost on a daily basis.  He only eats once a day.  He states he had a bowel movement about 2 to 3 days ago that was normal.  He has not had BM since then.  He states that it was semiliquid when he had a BM.  He has been having increasing pain in his abdomen.  He has been taking MiraLAX and stool softeners and other laxatives.  He has not had any results.  He feels a lot of pressure in his rectum.  On arrival temp 97.9 heart rate 84 blood pressure 172/83 satting 98% on room air.  White count 13.7, hemoglobin 15.8, platelets of 104  Lipase of 20  Sodium 136, potassium 4.3, chloride 101, bicarb 24, BUN of 33, creatinine 1.5, glucose 157  Calcium 8.8, total protein 8.3, albumin 3.2  AST of 37, ALT 26, alk phos of 239  Total bili 1.0  UA showed specific gravity 1.023, pH of 7, glucose 50, protein 100, negative nitrate, negative leukocyte esterase  CT abdomen pelvis demonstrates a 7.6 cm stool ball in the rectum.  There is mild perirectal edema posterior to the stool ball.  Large stool burden identified proximally including the sigmoid colon.  This is consistent with fecal impaction.  EDP reports performing a rectal examination but unable to  disimpact.  [facility] hospitalist consulted.   ED Course: WBC 13.7, HgB 15.8, BUN 33, Scr 1.5, CT abd shows fecal impaction  Review of Systems:  Review of Systems  Constitutional: Negative.   HENT: Negative.    Eyes: Negative.   Respiratory: Negative.    Cardiovascular: Negative.   Gastrointestinal:  Positive for abdominal pain and constipation. Negative for diarrhea.  Genitourinary: Negative.   Musculoskeletal:  Positive for back pain.  Skin: Negative.   Neurological:  Positive for weakness.       Chronic leg and arm weakness Only able to stand/pivot. Walks less than 20 feet  Endo/Heme/Allergies: Negative.   Psychiatric/Behavioral: Negative.    All other systems reviewed and are negative.   Past Medical History:  Diagnosis Date   Arthritis    Benign prostatic hypertrophy with hesitancy    Charcot's arthropathy, diabetic (HCC)    left foot   Chronic pain syndrome followed by dr mark phillps at pain clinic   secondary to MS and diabetic neuropathy   Chronic periodontal disease    CKD (chronic kidney disease), stage II    hypertensive per PCP note   Coronary artery disease hx MI  s/p  DES to [facility] 2005   hx cardiologist-- dr Riley Kill until he retired,  now followed by pcp , dr Jacky Kindle   Depression    Diabetic neuropathy [facility])    Essential tremor    Gait disorder    uses walker   History  of CVA (cerebrovascular accident)    2011-  right subcortical cva--  residual left mininmal side weakness and no use of left head   History of CVA with residual deficit    2011  --  right subcortical cva w/ mininmal left hemiparesis   History of diabetic ulcer of foot    2010  left foot   History of MI (myocardial infarction)    2005   Hypertension    dr Jacky Kindle   MS (multiple sclerosis) (HCC)    dx 1985   PONV (postoperative nausea and vomiting)    S/P drug eluting coronary stent placement    2005  to midRCA   Type 1 diabetes mellitus on insulin therapy [facility])     Past Surgical  History:  Procedure Laterality Date   CATARACT EXTRACTION W/ INTRAOCULAR LENS  IMPLANT, BILATERAL  Nov 2016   CORONARY ANGIOPLASTY WITH STENT PLACEMENT  10-18-2003  dr Riley Kill   DES x1  to  mRCA (high-grade lesion), diffuse diabetic abnormalities throughout the entire coronary tree w/ diffuse calcification and segmental plaquing throughout but noncritical disease   HARDWARE REMOVAL Left 04/20/2012   Procedure: Left foot removal deep retained screw;  Surgeon: Nadara Mustard, MD;  Location: [facility] OR;  Service: Orthopedics;  Laterality: Left;   MULTIPLE EXTRACTIONS WITH ALVEOLOPLASTY N/A 05/15/2015   Procedure: MULTIPLE EXTRACTIONS;  Surgeon: Filbert Berthold, DDS;  Location: [facility];  Service: Dentistry;  Laterality: N/A;   ORIF ANKLE FRACTURE  12/30/2011   Procedure: OPEN REDUCTION INTERNAL FIXATION (ORIF) ANKLE FRACTURE;  Surgeon: Nadara Mustard, MD;  Location: [facility] OR;  Service: Orthopedics;  Laterality: Left;  Excision Bone, Abscess Left Charcot Foot, Place Antibiotic Beads, Fixation Charcot   REMOVAL SUBMANDIDULAR GLAND, BILATERAL  1970's   benign     reports that he quit smoking about 10 years ago. His smoking use included cigars and cigarettes. He started smoking about 36 years ago. He has a 13 pack-year smoking history. He has never used smokeless tobacco. He reports that he does not drink alcohol and does not use drugs.  No Known Allergies  Family History  Problem Relation Age of Onset   Tremor Mother    Heart disease Father    Celiac disease Brother    Cancer - Prostate Paternal Grandfather     Prior to Admission medications   Medication Sig Start Date End Date Taking? Authorizing Provider  amLODipine (NORVASC) 5 MG tablet Take 5 mg by mouth every morning.    Yes [provider]  aspirin 81 MG tablet Take 81 mg by mouth daily.   Yes [provider]  atorvastatin (LIPITOR) 10 MG tablet Take 10 mg by mouth every morning.    Yes [provider]   clopidogrel (PLAVIX) 75 MG tablet Take 75 mg by mouth daily.   Yes [provider]  DULoxetine (CYMBALTA) 60 MG capsule Take 60 mg by mouth 2 (two) times daily.   Yes [provider]  finasteride (PROSCAR) 5 MG tablet Take 5 mg by mouth every morning.    Yes [provider]  Insulin NPH, Human,, Isophane, (HUMULIN N KWIKPEN) 100 UNIT/ML Kiwkpen Inject 10-30 Units into the skin 2 (two) times daily. Inject 30 units in the morning and Inject 10-15 units in the evening.   Yes [provider]  metoprolol tartrate (LOPRESSOR) 25 MG tablet Take 25 mg by mouth 2 (two) times daily.   Yes [provider]  oxyCODONE (ROXICODONE) 15 MG immediate  release tablet Take 1 tablet (15 mg total) by mouth every 6 (six) hours as needed for pain. 09/29/22  Yes   Tapentadol HCl (NUCYNTA ER) 200 MG TB12 Take 1 tablet (200 mg total) by mouth every 12 (twelve) hours. 09/29/22  Yes   traZODone (DESYREL) 150 MG tablet Take 150 mg by mouth at bedtime.   Yes [provider]  doxycycline (VIBRA-TABS) 100 MG tablet Take 1 tablet (100 mg total) by mouth 2 (two) times daily. Patient not taking: Reported on 10/10/2022 09/21/22   Helane Gunther, DPM  nitroGLYCERIN (NITRO-DUR) 0.4 mg/hr patch Apply patch to the white flesh of the toe daily. Patient not taking: Reported on 10/10/2022 07/04/17   Helane Gunther, DPM    Physical Exam: Vitals:   10/10/22 1555 10/10/22 1600 10/10/22 1925 10/10/22 2337  BP:  (!) 165/76 (!) 147/106 (!) 162/72  Pulse:  80 82 87  Resp:  18 18 20   Temp:   98.1 F (36.7 C) 98.1 F (36.7 C)  TempSrc:    Oral  SpO2: 98% 96% 100% 100%  Weight:      Height:        Physical Exam Vitals and nursing note reviewed.  Constitutional:      General: He is not in acute distress.    Appearance: He is ill-appearing. He is not toxic-appearing or diaphoretic.     Comments: Chronically ill appearing male. No distress  HENT:     Head: Normocephalic and atraumatic.      Nose: Nose normal.  Eyes:     General: No scleral icterus. Cardiovascular:     Rate and Rhythm: Normal rate and regular rhythm.  Pulmonary:     Effort: Pulmonary effort is normal.     Breath sounds: Normal breath sounds.  Abdominal:     General: Bowel sounds are normal. There is no distension.     Palpations: Abdomen is soft.     Tenderness: There is abdominal tenderness in the right lower quadrant and left lower quadrant. There is no guarding or rebound.  Skin:    General: Skin is warm and dry.     Findings: Lesion present.     Comments: See picture of right and left foot And compare pictures from podiatrist office on 10-05-2022. Today's picture looks improved from office and pt has not been on any ABX.  Neurological:     Mental Status: He is alert.    Today In Podiatry office on 10-05-2022       Labs on Admission: I have personally reviewed following labs and imaging studies  CBC: Recent Labs  Lab 10/10/22 1645  WBC 13.7*  NEUTROABS 11.4*  HGB 15.8  HCT 46.7  MCV 99.6  PLT 104*   Basic Metabolic Panel: Recent Labs  Lab 10/10/22 1645  NA 136  K 4.3  CL 101  CO2 24  GLUCOSE 157*  BUN 33*  CREATININE 1.50*  CALCIUM 8.8*   GFR: Estimated Creatinine Clearance: 51 mL/min (A) (by C-G formula based on SCr of 1.5 mg/dL (H)). Liver Function Tests: Recent Labs  Lab 10/10/22 1645  AST 37  ALT 26  ALKPHOS 239*  BILITOT 1.0  PROT 8.3*  ALBUMIN 3.2*   Recent Labs  Lab 10/10/22 1645  LIPASE 20   CBG: Recent Labs  Lab 10/10/22 2228  GLUCAP 89   Urine analysis:    Component Value Date/Time   COLORURINE YELLOW 10/10/2022 2126   APPEARANCEUR CLEAR 10/10/2022 2126   LABSPEC 1.023 10/10/2022 2126  PHURINE 7.0 10/10/2022 2126   GLUCOSEU 50 (A) 10/10/2022 2126   HGBUR NEGATIVE 10/10/2022 2126   BILIRUBINUR NEGATIVE 10/10/2022 2126   KETONESUR 5 (A) 10/10/2022 2126   PROTEINUR 100 (A) 10/10/2022 2126   NITRITE NEGATIVE 10/10/2022 2126   LEUKOCYTESUR  NEGATIVE 10/10/2022 2126    Radiological Exams on Admission: I have personally reviewed images CT ABDOMEN PELVIS W CONTRAST  Result Date: 10/10/2022 CLINICAL DATA:  clinical constipation.  Abdominal discomfort. EXAM: CT ABDOMEN AND PELVIS WITH CONTRAST TECHNIQUE: Multidetector CT imaging of the abdomen and pelvis was performed using the standard protocol following bolus administration of intravenous contrast. RADIATION DOSE REDUCTION: This exam was performed according to the departmental dose-optimization program which includes automated exposure control, adjustment of the mA and/or kV according to patient size and/or use of iterative reconstruction technique. CONTRAST:  OMNIPAQUE IOHEXOL 300 MG/ML  SOLN COMPARISON:  None Available. FINDINGS: Lower chest: Centrilobular and paraseptal emphysema. Right lower lobe calcified granulomas. Motion degraded evaluation of the lung bases. 5 mm left lower lobe pulmonary nodule on 08/05 is most likely a subpleural lymph node adjacent the left major fissure. No specific imaging follow-up indicated per Fleischner criteria. Normal heart size without pericardial or pleural effusion. Three vessel coronary artery calcification. Hepatobiliary: Normal liver. Small dependent gallstones without acute cholecystitis or biliary duct dilatation. Pancreas: The pancreas is markedly atrophic/fatty replaced. Spleen: Normal in size, without focal abnormality. Adrenals/Urinary Tract: Normal adrenal glands. Mild renal cortical thinning bilaterally. Bilateral too small to characterize renal lesions are most likely cysts . In the absence of clinically indicated signs/symptoms require(s) no independent follow-up. No hydronephrosis. Normal urinary bladder. Stomach/Bowel: Normal stomach, without wall thickening. 7.6 cm stool ball in the rectum. Mild perirectal edema identified posteriorly on 78/2. No wall thickening. Large stool burden identified more proximally, including within the sigmoid  colon. Normal terminal ileum. The appendix is upper normal sized on 62/2 but no periappendiceal inflammation is seen. Normal small bowel. Vascular/Lymphatic: Advanced aortic and branch vessel atherosclerosis. No abdominopelvic adenopathy. Reproductive: Normal prostate. Other: No significant free fluid.  No free intraperitoneal air. Musculoskeletal: Right greater than left hip osteoarthritis. Lower thoracic and upper lumbar degenerative disc disease. IMPRESSION: 1. Rectal stool ball is most consistent with fecal impaction. Mild perirectal edema suggests stercoral proctitis. 2. Motion degradation, primarily involving the lung bases. 3. Cholelithiasis 4. Markedly atrophic pancreas 5. Aortic atherosclerosis (ICD10-I70.0), coronary artery atherosclerosis and emphysema (ICD10-J43.9). Electronically Signed   By: Jeronimo Greaves M.D.   On: 10/10/2022 18:56    EKG: My personal interpretation of EKG shows: no EKG to review  Assessment/Plan Principal Problem:   Fecal impaction (HCC) Active Problems:   Type 1 diabetes mellitus with complications (HCC)   Pure hypercholesterolemia   Essential hypertension   Abnormality of gait   Multiple sclerosis (HCC)   Primary amyloidosis (HCC)   Chronic pain syndrome   Chronic kidney disease, stage 3a (HCC)   Diabetic ulcer of toe (HCC)   Thrombocytopenia (HCC)   Assessment and Plan: * Fecal impaction (HCC) Admit to obs med/surg bed. Continue with po miralax, SMOG enemas, dulcolax. Add movantik as pt on chronic long term opiates. Add po flagyl for stercoral colitis. Keep on clear liquid diet. Consider repeat CT abd/pelvis without contrast to document resolution of fecal impaction. Pt states he has been having bowel movements this week. He's probably leaking around this gigantic stool ball in his rectum.  Thrombocytopenia (HCC) Unclear the cause of his low platelet count. Will place on SCDs for DVT  prophylaxis.  Diabetic ulcer of toe (HCC) Pt seen by podiatry 4 days ago  and had debridement of his ulcer. Podiatry did not think his ulcer was infected. Neither do I. Will not continue abx started by EDP.   Pt's foot appears improved compared to picture taken in podiatry office on 10-05-2022.  Chronic kidney disease, stage 3a (HCC) Stable. Baseline Scr 1.3  Chronic pain syndrome On chronic Nucynta and oxycodone. His chronic opiate therapy a cause of his chronic constipation. Add po movantik  Primary amyloidosis (HCC) Stable. Not on any specific therapy.  Multiple sclerosis (HCC) Chronic. Pt not on any specific therapy for his MS.  Abnormality of gait Pt able to stand and transfer. Walks with assistance a short distance to kitchen(no more than 20 feet).  Essential hypertension Stable on norvasc and lopressor.  Pure hypercholesterolemia Continue with lipitor 10 mg daily.  Type 1 diabetes mellitus with complications (HCC) Continue with insulin.     DVT prophylaxis: SCDs Code Status: Full Code Family Communication: no family at bedside  Disposition Plan: return home  Consults called: none  Admission status: Observation, Med-Surg   Carollee Herter, DO [facility] 10/10/2022, 11:39 PM

## 2022-10-10 NOTE — Subjective & Objective (Signed)
CC: constipation HPI: 77 year old Caucasian male history of multiple sclerosis, hypertension, type 1 diabetes since the age of 56, chronic kidney disease stage IIIa baseline creatinine 1.3, prior history of stroke, chronic pain syndrome on Nucynta and oxycodone, hyperlipidemia, amyloidosis, presents to the ER today with constipation and abdominal pain.  Patient states that he has chronic constipation.  He is on multiple narcotics.  He states that he takes MiraLAX almost on a daily basis.  He only eats once a day.  He states he had a bowel movement about 2 to 3 days ago that was normal.  He has not had BM since then.  He states that it was semiliquid when he had a BM.  He has been having increasing pain in his abdomen.  He has been taking MiraLAX and stool softeners and other laxatives.  He has not had any results.  He feels a lot of pressure in his rectum.  On arrival temp 97.9 heart rate 84 blood pressure 172/83 satting 98% on room air.  White count 13.7, hemoglobin 15.8, platelets of 104  Lipase of 20  Sodium 136, potassium 4.3, chloride 101, bicarb 24, BUN of 33, creatinine 1.5, glucose 157  Calcium 8.8, total protein 8.3, albumin 3.2  AST of 37, ALT 26, alk phos of 239  Total bili 1.0  UA showed specific gravity 1.023, pH of 7, glucose 50, protein 100, negative nitrate, negative leukocyte esterase  CT abdomen pelvis demonstrates a 7.6 cm stool ball in the rectum.  There is mild perirectal edema posterior to the stool ball.  Large stool burden identified proximally including the sigmoid colon.  This is consistent with fecal impaction.  EDP reports performing a rectal examination but unable to disimpact.  [facility] hospitalist consulted.

## 2022-10-10 NOTE — ED Notes (Signed)
Pt tolerated of anemia and asked to stop. MD made aware.

## 2022-10-11 ENCOUNTER — Encounter (HOSPITAL_COMMUNITY): Payer: Self-pay | Admitting: Internal Medicine

## 2022-10-11 DIAGNOSIS — E7800 Pure hypercholesterolemia, unspecified: Secondary | ICD-10-CM | POA: Diagnosis present

## 2022-10-11 DIAGNOSIS — R1032 Left lower quadrant pain: Secondary | ICD-10-CM | POA: Diagnosis not present

## 2022-10-11 DIAGNOSIS — E10610 Type 1 diabetes mellitus with diabetic neuropathic arthropathy: Secondary | ICD-10-CM | POA: Diagnosis present

## 2022-10-11 DIAGNOSIS — I2510 Atherosclerotic heart disease of native coronary artery without angina pectoris: Secondary | ICD-10-CM | POA: Diagnosis present

## 2022-10-11 DIAGNOSIS — D696 Thrombocytopenia, unspecified: Secondary | ICD-10-CM | POA: Diagnosis present

## 2022-10-11 DIAGNOSIS — L97518 Non-pressure chronic ulcer of other part of right foot with other specified severity: Secondary | ICD-10-CM | POA: Diagnosis present

## 2022-10-11 DIAGNOSIS — L97528 Non-pressure chronic ulcer of other part of left foot with other specified severity: Secondary | ICD-10-CM | POA: Diagnosis present

## 2022-10-11 DIAGNOSIS — I69354 Hemiplegia and hemiparesis following cerebral infarction affecting left non-dominant side: Secondary | ICD-10-CM | POA: Diagnosis not present

## 2022-10-11 DIAGNOSIS — R933 Abnormal findings on diagnostic imaging of other parts of digestive tract: Secondary | ICD-10-CM | POA: Diagnosis not present

## 2022-10-11 DIAGNOSIS — E663 Overweight: Secondary | ICD-10-CM | POA: Diagnosis present

## 2022-10-11 DIAGNOSIS — R269 Unspecified abnormalities of gait and mobility: Secondary | ICD-10-CM | POA: Diagnosis not present

## 2022-10-11 DIAGNOSIS — I129 Hypertensive chronic kidney disease with stage 1 through stage 4 chronic kidney disease, or unspecified chronic kidney disease: Secondary | ICD-10-CM | POA: Diagnosis present

## 2022-10-11 DIAGNOSIS — E1040 Type 1 diabetes mellitus with diabetic neuropathy, unspecified: Secondary | ICD-10-CM | POA: Diagnosis present

## 2022-10-11 DIAGNOSIS — G894 Chronic pain syndrome: Secondary | ICD-10-CM | POA: Diagnosis present

## 2022-10-11 DIAGNOSIS — K5903 Drug induced constipation: Secondary | ICD-10-CM | POA: Diagnosis present

## 2022-10-11 DIAGNOSIS — E108 Type 1 diabetes mellitus with unspecified complications: Secondary | ICD-10-CM | POA: Diagnosis not present

## 2022-10-11 DIAGNOSIS — I10 Essential (primary) hypertension: Secondary | ICD-10-CM | POA: Diagnosis not present

## 2022-10-11 DIAGNOSIS — N1831 Chronic kidney disease, stage 3a: Secondary | ICD-10-CM | POA: Diagnosis present

## 2022-10-11 DIAGNOSIS — E1022 Type 1 diabetes mellitus with diabetic chronic kidney disease: Secondary | ICD-10-CM | POA: Diagnosis present

## 2022-10-11 DIAGNOSIS — Z79899 Other long term (current) drug therapy: Secondary | ICD-10-CM | POA: Diagnosis not present

## 2022-10-11 DIAGNOSIS — K5641 Fecal impaction: Secondary | ICD-10-CM | POA: Diagnosis present

## 2022-10-11 DIAGNOSIS — K5900 Constipation, unspecified: Secondary | ICD-10-CM | POA: Diagnosis not present

## 2022-10-11 DIAGNOSIS — F32A Depression, unspecified: Secondary | ICD-10-CM | POA: Diagnosis present

## 2022-10-11 DIAGNOSIS — E10649 Type 1 diabetes mellitus with hypoglycemia without coma: Secondary | ICD-10-CM | POA: Diagnosis not present

## 2022-10-11 DIAGNOSIS — E8581 Light chain (AL) amyloidosis: Secondary | ICD-10-CM | POA: Diagnosis not present

## 2022-10-11 DIAGNOSIS — Z794 Long term (current) use of insulin: Secondary | ICD-10-CM | POA: Diagnosis not present

## 2022-10-11 DIAGNOSIS — R1031 Right lower quadrant pain: Secondary | ICD-10-CM | POA: Diagnosis not present

## 2022-10-11 DIAGNOSIS — E8809 Other disorders of plasma-protein metabolism, not elsewhere classified: Secondary | ICD-10-CM | POA: Diagnosis present

## 2022-10-11 DIAGNOSIS — K5289 Other specified noninfective gastroenteritis and colitis: Secondary | ICD-10-CM | POA: Diagnosis present

## 2022-10-11 DIAGNOSIS — K8020 Calculus of gallbladder without cholecystitis without obstruction: Secondary | ICD-10-CM | POA: Diagnosis not present

## 2022-10-11 DIAGNOSIS — L97512 Non-pressure chronic ulcer of other part of right foot with fat layer exposed: Secondary | ICD-10-CM | POA: Diagnosis not present

## 2022-10-11 DIAGNOSIS — I252 Old myocardial infarction: Secondary | ICD-10-CM | POA: Diagnosis not present

## 2022-10-11 DIAGNOSIS — E10621 Type 1 diabetes mellitus with foot ulcer: Secondary | ICD-10-CM | POA: Diagnosis present

## 2022-10-11 DIAGNOSIS — G35 Multiple sclerosis: Secondary | ICD-10-CM | POA: Diagnosis present

## 2022-10-11 DIAGNOSIS — N400 Enlarged prostate without lower urinary tract symptoms: Secondary | ICD-10-CM | POA: Diagnosis present

## 2022-10-11 LAB — GLUCOSE, CAPILLARY
Glucose-Capillary: 147 mg/dL — ABNORMAL HIGH (ref 70–99)
Glucose-Capillary: 165 mg/dL — ABNORMAL HIGH (ref 70–99)
Glucose-Capillary: 199 mg/dL — ABNORMAL HIGH (ref 70–99)
Glucose-Capillary: 140 mg/dL — ABNORMAL HIGH (ref 70–99)
Glucose-Capillary: 223 mg/dL — ABNORMAL HIGH (ref 70–99)

## 2022-10-11 LAB — HEMOGLOBIN A1C
Hgb A1c MFr Bld: 5.8 % — ABNORMAL HIGH (ref 4.8–5.6)
Mean Plasma Glucose: 119.76 mg/dL

## 2022-10-11 MED ORDER — ORAL CARE MOUTH RINSE: 15.0000 mL | OROMUCOSAL | Status: DC | PRN

## 2022-10-11 MED ORDER — SODIUM CHLORIDE 0.9 % IV SOLN: INTRAVENOUS | Status: DC | PRN

## 2022-10-11 MED ORDER — GERHARDT'S BUTT CREAM
TOPICAL_CREAM | Freq: Three times a day (TID) | CUTANEOUS | Status: DC
  Administered 2022-10-13: 1 via TOPICAL
  Filled 2022-10-11: qty 1

## 2022-10-11 NOTE — Consult Note (Signed)
WOC Nurse Consult Note: patient is followed by podiatry for diabetic ulcers to toes, last seen 10/05/2022 with orders to paint with Betadine twice daily  Reason for Consult: toes ulcers  Wound type: full thickness, Diabetic ulcers to anterior surface B 2nd digits  Pressure Injury POA: NA Measurement: 1.  R 2nd digit 0.5 cm x 0.5 cm 100% dry eschar  2.  L 2nd digit 2 separate areas 0.5 x 0.5 cm each 100% dry eschar  Drainage (amount, consistency, odor)  none dry  Periwound: mild erythema  Dressing procedure/placement/frequency: Clean bilateral 2nd digit ulcers with NS, paint with Betadine twice daily. May leave open to air or wrap with dry gauze and Kerlix roll gauze, whichever is preferred.   Patient also noted to have dusky appearance to sacrum/coccyx and buttocks.  This appears to be chronic tissue damage to this area.    Will order Gerhardt's Butt Cream 3 times a day and prn soiling.    POC discussed with bedside nurse. WOC team will not follow at this time. Re-consult if further needs arise.   Thank you,    Priscella Mann MSN, RN-BC, [employer] [phone]

## 2022-10-11 NOTE — ED Notes (Signed)
Pt asked Nurse to resume enema. Pt received an additional of soap enema .

## 2022-10-11 NOTE — TOC Initial Note (Signed)
Transition of Care (TOC) - Initial/Assessment Note   Patient Details  Name: [name] Gair Cohrs Jr. MRN: [id number] Date of Birth: [birth year]  Transition of Care [facility]) CM/SW Contact:    Ewing Schlein, LCSW Phone Number: 10/11/2022, 10:17 AM  Clinical Narrative: [facility] consulted for medication assistance. Per chart review, patient has traditional Medicare and a supplemental insurance plan so he is not eligible for medication assistance at this time. TOC to clear consult, but can be consulted again if additional needs arise.                 Expected Discharge Plan: Home/[place] Care Barriers to Discharge: Continued Medical Work up  Patient Goals and CMS Choice Choice offered to / list presented to : NA  Expected Discharge Plan and Services Post Acute Care Choice: NA Living arrangements for the past 2 months: Single Family Home         DME Arranged: N/A DME Agency: NA  Prior Living Arrangements/Services Living arrangements for the past 2 months: Single Family Home Lives with:: Spouse Patient language and need for interpreter reviewed:: Yes Need for Family Participation in Patient Care: No (Comment) Care giver support system in place?: Yes (comment) Criminal Activity/Legal Involvement Pertinent to Current Situation/Hospitalization: No - Comment as needed  Activities of Daily Living Home Assistive Devices/Equipment: CBG Meter, Dentures (specify type), Grab bars around toilet, Grab bars in shower, Scales, Walker (specify type) ADL Screening (condition at time of admission) [name] cognitive ability adequate to safely complete daily activities?: Yes Is the patient deaf or have difficulty hearing?: Yes Does the patient have difficulty seeing, even when wearing glasses/contacts?: No Does the patient have difficulty concentrating, remembering, or making decisions?: Yes Patient able to express need for assistance with ADLs?: Yes Does the patient have difficulty dressing or bathing?:  Yes Independently performs ADLs?: No Communication: Independent Dressing (OT): Needs assistance Is this a change from baseline?: Pre-admission baseline Grooming: Independent Feeding: Independent Bathing: Needs assistance Is this a change from baseline?: Pre-admission baseline Toileting: Needs assistance Is this a change from baseline?: Change from baseline, expected to last <3 days In/Out Bed: Needs assistance Is this a change from baseline?: Change from baseline, expected to last <3 days Does the patient have difficulty walking or climbing stairs?: Yes Weakness of Legs: Left Weakness of Arms/Hands: Left  Emotional Assessment Orientation: : Oriented to [place], Oriented to Place, Oriented to  Time, Oriented to Situation Alcohol / Substance Use: Not Applicable Psych Involvement: No (comment)  Admission diagnosis:  Fecal impaction (HCC) [K56.41] Diabetic ulcer of foot associated with diabetes mellitus due to underlying condition, limited to breakdown of skin, unspecified laterality, unspecified part of foot (HCC) [U13.244, L97.501] 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] Patient Active Problem List   Diagnosis Date Noted   Chronic kidney disease, stage 3a (HCC) 10/10/2022   Diabetic ulcer of toe (HCC) 10/10/2022   Thrombocytopenia (HCC) 10/10/2022   Local infection of skin and subcutaneous tissue 09/21/2022   Chronic pain syndrome 04/01/2017   Chronic kidney disease, stage II (mild) 03/31/2017   Coronary artery disease involving native coronary artery 03/31/2017   Depression 03/31/2017   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) 03/31/2017   Fecal impaction (HCC) 03/31/2017   History of CVA (cerebrovascular accident) 03/31/2017   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/05/2015   Other fatigue 10/05/2015   Dyspnea 10/05/2015   Late effects of cerebrovascular accident 04/20/2013   Multiple sclerosis (HCC)  06/04/2012  Unspecified cerebral artery occlusion with cerebral infarction 03/31/2011   Abnormality of gait 03/31/2011   Polyneuropathy in diabetes(357.2) 03/31/2011   Pure hypercholesterolemia 01/07/2009   Type 1 diabetes mellitus with complications (HCC) 01/06/2009   B12 DEFICIENCY 01/06/2009   Essential hypertension 01/06/2009   Coronary atherosclerosis 01/06/2009   PCP:  Geoffry Paradise, MD Pharmacy:   [facility] APFS No address on file   [facility] [address] [place] [address] Phone: [phone] Fax: [phone]  Social Determinants of Health (SDOH) Social History: SDOH Screenings   Food Insecurity: No Food Insecurity (10/10/2022)  Housing: Medium Risk (10/10/2022)  Transportation Needs: No Transportation Needs (10/10/2022)  Utilities: Not At Risk (10/10/2022)  Tobacco Use: Medium Risk (10/11/2022)   SDOH Interventions: Housing Interventions: Intervention Not Indicated, Inpatient TOC (Patient has housing.)  Readmission Risk Interventions     No data to display

## 2022-10-11 NOTE — Progress Notes (Signed)
PROGRESS NOTE    [name] M Marchitto Jr.  WUJ:[id number] DOB: 07/23/1945 DOA: 10/10/2022 PCP: Geoffry Paradise, MD   Brief Narrative:  This 77 year old Male with PMH significant for history of multiple sclerosis, hypertension, type 1 diabetes since the age of 74, chronic kidney disease stage IIIa,  baseline creatinine 1.3, prior history of stroke, chronic pain syndrome on Nucynta and oxycodone, hyperlipidemia, amyloidosis, presented in the ED with constipation and abdominal pain.  Patient has been on multiple narcotic medications and has had chronic constipation.  He usually takes MiraLAX almost on daily basis.  Last bowel movement was 2 to 3 days ago.  Patient reports increasing abdominal pain, has been taking his regular MiraLAX and stool softeners and other laxatives.  Pertinent labs in the ED lipase 20, CT abdomen and pelvis demonstrates 7.6 cm stool ball in the rectum, there is mild perirectal edema posterior to the stool ball consistent with fecal impaction.  Patient admitted for further evaluation.  Assessment & Plan:   Principal Problem:   Fecal impaction (HCC) Active Problems:   Type 1 diabetes mellitus with complications (HCC)   Pure hypercholesterolemia   Essential hypertension   Abnormality of gait   Multiple sclerosis (HCC)   Primary amyloidosis (HCC)   Chronic pain syndrome   Chronic kidney disease, stage 3a (HCC)   Diabetic ulcer of toe (HCC)   Thrombocytopenia (HCC)  Fecal impaction: Patient presented with abdominal pain and constipation . Continue with PO miralax, SMOG enemas, dulcolax.  Continue movantik as pt is on chronic long term opiates.  Continue flagyl for stercoral colitis.  Continue clear liquid diet.  Pt states he has been having bowel movements this week.  He's probably leaking around this stool ball in his rectum. Consider repeat CT abd/pelvis without contrast to check resolution of fecal impaction.    Thrombocytopenia (HCC) Unclear the cause of his low  platelet count.  Place on SCDs for DVT prophylaxis.   Diabetic ulcer of toe (HCC) Patient was seen by podiatry 4 days ago and had debridement of his ulcer.  Podiatry did not think his ulcer was infected.  Pt's foot appears improved compared to picture taken in podiatry office on 10-05-2022.   Chronic kidney disease, stage 3a (HCC) Serum creatinine at baseline.  Stable. Baseline Scr 1.3   Chronic pain syndrome: On chronic Nucynta and oxycodone.  His chronic opiate therapy could be the cause of his chronic constipation.  Continue Movantik   Primary amyloidosis (HCC) Stable. Not on any specific therapy.   Multiple sclerosis (HCC) Chronic. Pt not on any specific therapy for his MS.   Abnormality of gait Pt able to stand and transfer. Walks with assistance a short distance to kitchen(no more than 20 feet).   Essential hypertension: Continue norvasc and lopressor.   Pure hypercholesterolemia Continue with lipitor 10 mg daily.   Type 1 diabetes mellitus with complications (HCC) Continue with insulin.    DVT prophylaxis: SCDs Code Status:  Full code Family Communication:No family at bed side Disposition Plan:    Status is: Inpatient Remains inpatient appropriate because: Patient admitted for abdominal pain,  found to have stool impaction, stercoral colitis    Consultants:  None  Procedures: CT A/P  Antimicrobials:  Anti-infectives (From admission, onward)    Start     Dose/Rate Route Frequency Ordered Stop   10/10/22 2330  metroNIDAZOLE (FLAGYL) tablet 500 mg        500 mg Oral Every 12 hours 10/10/22 2326  Subjective: Patient seen and examined at bedside.  Overnight events noted.   Patient reports abdominal pain is still there and has 2 liquidy bowel movements.  Objective: Vitals:   10/11/22 0448 10/11/22 0542 10/11/22 0958 10/11/22 1354  BP:  (!) 159/68 (!) 177/70 (!) 155/66  Pulse:  68 66 (!) 55  Resp:  18 18 18   Temp:  98.7 F (37.1 C) 97.6 F  (36.4 C) (!) 97.5 F (36.4 C)  TempSrc:  Oral Oral   SpO2:  95% 97% 98%  Weight: 91.3 kg     Height:        Intake/Output Summary (Last 24 hours) at 10/11/2022 1434 Last data filed at 10/11/2022 1351 Gross per 24 hour  Intake 1440 ml  Output 250 ml  Net 1190 ml   Filed Weights   10/10/22 1550 10/10/22 2357 10/11/22 0448  Weight: 95.3 kg 89.9 kg 91.3 kg    Examination:  General exam: Appears calm and comfortable, deconditioned, not in any acute distress. Respiratory system: CTA bilaterally. Respiratory effort normal. RR 16 Cardiovascular system: S1 & S2 heard, RRR. No JVD, murmurs, rubs, gallops or clicks. No pedal edema. Gastrointestinal system: Abdomen is non distended, soft and non tender. Normal bowel sounds heard. Central nervous system: Alert and oriented x 3. No focal neurological deficits. Extremities: Symmetric 5 x 5 power. Skin: No rashes, lesions or ulcers Psychiatry: Judgement and insight appear normal. Mood & affect appropriate.     Data Reviewed: I have personally reviewed following labs and imaging studies  CBC: Recent Labs  Lab 10/10/22 1645  WBC 13.7*  NEUTROABS 11.4*  HGB 15.8  HCT 46.7  MCV 99.6  PLT 104*   Basic Metabolic Panel: Recent Labs  Lab 10/10/22 1645  NA 136  K 4.3  CL 101  CO2 24  GLUCOSE 157*  BUN 33*  CREATININE 1.50*  CALCIUM 8.8*   GFR: Estimated Creatinine Clearance: 46.7 mL/min (A) (by C-G formula based on SCr of 1.5 mg/dL (H)). Liver Function Tests: Recent Labs  Lab 10/10/22 1645  AST 37  ALT 26  ALKPHOS 239*  BILITOT 1.0  PROT 8.3*  ALBUMIN 3.2*   Recent Labs  Lab 10/10/22 1645  LIPASE 20   No results for input(s): "AMMONIA" in the last 168 hours. Coagulation Profile: No results for input(s): "INR", "PROTIME" in the last 168 hours. Cardiac Enzymes: No results for input(s): "CKTOTAL", "CKMB", "CKMBINDEX", "TROPONINI" in the last 168 hours. BNP (last 3 results) No results for input(s): "PROBNP" in the  last 8760 hours. HbA1C: Recent Labs    10/11/22 0322  HGBA1C 5.8*   CBG: Recent Labs  Lab 10/10/22 2228 10/11/22 0059 10/11/22 0740 10/11/22 1133  GLUCAP 89 147* 165* 199*   Lipid Profile: No results for input(s): "CHOL", "HDL", "LDLCALC", "TRIG", "CHOLHDL", "LDLDIRECT" in the last 72 hours. Thyroid Function Tests: No results for input(s): "TSH", "T4TOTAL", "FREET4", "T3FREE", "THYROIDAB" in the last 72 hours. Anemia Panel: No results for input(s): "VITAMINB12", "FOLATE", "FERRITIN", "TIBC", "IRON", "RETICCTPCT" in the last 72 hours. Sepsis Labs: No results for input(s): "PROCALCITON", "LATICACIDVEN" in the last 168 hours.  No results found for this or any previous visit (from the past 240 hour(s)).       Radiology Studies: CT ABDOMEN PELVIS W CONTRAST  Result Date: 10/10/2022 CLINICAL DATA:  clinical constipation.  Abdominal discomfort. EXAM: CT ABDOMEN AND PELVIS WITH CONTRAST TECHNIQUE: Multidetector CT imaging of the abdomen and pelvis was performed using the standard protocol following bolus administration of intravenous  contrast. RADIATION DOSE REDUCTION: This exam was performed according to the departmental dose-optimization program which includes automated exposure control, adjustment of the mA and/or kV according to patient size and/or use of iterative reconstruction technique. CONTRAST:  OMNIPAQUE IOHEXOL 300 MG/ML  SOLN COMPARISON:  None Available. FINDINGS: Lower chest: Centrilobular and paraseptal emphysema. Right lower lobe calcified granulomas. Motion degraded evaluation of the lung bases. 5 mm left lower lobe pulmonary nodule on 08/05 is most likely a subpleural lymph node adjacent the left major fissure. No specific imaging follow-up indicated per Fleischner criteria. Normal heart size without pericardial or pleural effusion. Three vessel coronary artery calcification. Hepatobiliary: Normal liver. Small dependent gallstones without acute cholecystitis or  biliary duct dilatation. Pancreas: The pancreas is markedly atrophic/fatty replaced. Spleen: Normal in size, without focal abnormality. Adrenals/Urinary Tract: Normal adrenal glands. Mild renal cortical thinning bilaterally. Bilateral too small to characterize renal lesions are most likely cysts . In the absence of clinically indicated signs/symptoms require(s) no independent follow-up. No hydronephrosis. Normal urinary bladder. Stomach/Bowel: Normal stomach, without wall thickening. 7.6 cm stool ball in the rectum. Mild perirectal edema identified posteriorly on 78/2. No wall thickening. Large stool burden identified more proximally, including within the sigmoid colon. Normal terminal ileum. The appendix is upper normal sized on 62/2 but no periappendiceal inflammation is seen. Normal small bowel. Vascular/Lymphatic: Advanced aortic and branch vessel atherosclerosis. No abdominopelvic adenopathy. Reproductive: Normal prostate. Other: No significant free fluid.  No free intraperitoneal air. Musculoskeletal: Right greater than left hip osteoarthritis. Lower thoracic and upper lumbar degenerative disc disease. IMPRESSION: 1. Rectal stool ball is most consistent with fecal impaction. Mild perirectal edema suggests stercoral proctitis. 2. Motion degradation, primarily involving the lung bases. 3. Cholelithiasis 4. Markedly atrophic pancreas 5. Aortic atherosclerosis (ICD10-I70.0), coronary artery atherosclerosis and emphysema (ICD10-J43.9). Electronically Signed   By: Jeronimo Greaves M.D.   On: 10/10/2022 18:56    Scheduled Meds:  amLODipine  5 mg Oral q morning   aspirin  81 mg Oral Daily   atorvastatin  10 mg Oral q morning   bisacodyl  10 mg Oral Daily   clopidogrel  75 mg Oral Daily   DULoxetine  60 mg Oral BID   finasteride  5 mg Oral q morning   Gerhardt's butt cream   Topical TID   insulin aspart  0-5 Units Subcutaneous QHS   insulin aspart  0-9 Units Subcutaneous TID WC   insulin NPH Human  15 Units  Subcutaneous QHS   insulin NPH Human  30 Units Subcutaneous QAC breakfast   metoprolol tartrate  25 mg Oral BID   metroNIDAZOLE  500 mg Oral Q12H   naloxegol oxalate  12.5 mg Oral Daily   polyethylene glycol  34 g Oral Daily   sorbitol, milk of mag, mineral oil, glycerin (SMOG) enema  400 mL Rectal Q4H   tapentadol  200 mg Oral Q12H   traZODone  150 mg Oral QHS   Continuous Infusions:  sodium chloride       LOS: 0 days    Time spent: 50 mins    Willeen Niece, MD [facility]   If 7PM-7AM, please contact night-coverage

## 2022-10-11 NOTE — Consult Note (Signed)
Reason for Consult: Fecal Impaction Referring Physician: [facility]  Benicio M Brumley Montez Hageman. HPI: This is a 77 year old male with a PMH of chronic pain syndrome, CKD, CAD, MS, depression, and history of MI admitted for a fecal impaction.  The patient uses Nucynta and oxycodone on a routine basis.  He reports having issues with lower abdominal pain over the past several days before admission.  He used Miralax daily and he reported a bowel movement every 2-3 days, however, his last bowel movement, prior to admission, was several days ago.  The bowel movements of late were liquid.  As his lower abdominal pain worsened he presented to the ER.  A CT scan showed that he had a 7.6 cm stool ball in the rectum.  There was evidence of a stercoral proctitis.  He was started on Movantik and enemas.  Currently he states that the lower abdominal pain lessened and he was able to pass some solid bowel movements.  Past Medical History:  Diagnosis Date   Arthritis    Benign prostatic hypertrophy with hesitancy    Charcot's arthropathy, diabetic (HCC)    left foot   Chronic pain syndrome followed by dr mark phillps at pain clinic   secondary to MS and diabetic neuropathy   Chronic periodontal disease    CKD (chronic kidney disease), stage II    hypertensive per PCP note   Coronary artery disease hx MI  s/p  DES to [facility] 2005   hx cardiologist-- dr Riley Kill until he retired,  now followed by pcp , dr Jacky Kindle   Depression    Diabetic neuropathy [facility])    Essential tremor    Gait disorder    uses walker   History of CVA (cerebrovascular accident)    2011-  right subcortical cva--  residual left mininmal side weakness and no use of left head   History of CVA with residual deficit    2011  --  right subcortical cva w/ mininmal left hemiparesis   History of diabetic ulcer of foot    2010  left foot   History of MI (myocardial infarction)    2005   Hypertension    dr Jacky Kindle   MS (multiple sclerosis) (HCC)     dx 1985   PONV (postoperative nausea and vomiting)    S/P drug eluting coronary stent placement    2005  to midRCA   Type 1 diabetes mellitus on insulin therapy [facility])     Past Surgical History:  Procedure Laterality Date   CATARACT EXTRACTION W/ INTRAOCULAR LENS  IMPLANT, BILATERAL  Nov 2016   CORONARY ANGIOPLASTY WITH STENT PLACEMENT  10-18-2003  dr Riley Kill   DES x1  to  mRCA (high-grade lesion), diffuse diabetic abnormalities throughout the entire coronary tree w/ diffuse calcification and segmental plaquing throughout but noncritical disease   HARDWARE REMOVAL Left 04/20/2012   Procedure: Left foot removal deep retained screw;  Surgeon: Nadara Mustard, MD;  Location: [facility] OR;  Service: Orthopedics;  Laterality: Left;   MULTIPLE EXTRACTIONS WITH ALVEOLOPLASTY N/A 05/15/2015   Procedure: MULTIPLE EXTRACTIONS;  Surgeon: Filbert Berthold, DDS;  Location: [facility];  Service: Dentistry;  Laterality: N/A;   ORIF ANKLE FRACTURE  12/30/2011   Procedure: OPEN REDUCTION INTERNAL FIXATION (ORIF) ANKLE FRACTURE;  Surgeon: Nadara Mustard, MD;  Location: [facility] OR;  Service: Orthopedics;  Laterality: Left;  Excision Bone, Abscess Left Charcot Foot, Place Antibiotic Beads, Fixation Charcot   REMOVAL SUBMANDIDULAR GLAND, BILATERAL  1970's  benign    Family History  Problem Relation Age of Onset   Tremor Mother    Heart disease Father    Celiac disease Brother    Cancer - Prostate Paternal Grandfather     Social History:  reports that he quit smoking about 10 years ago. His smoking use included cigars and cigarettes. He started smoking about 36 years ago. He has a 13 pack-year smoking history. He has never used smokeless tobacco. He reports that he does not drink alcohol and does not use drugs.  Allergies: No Known Allergies  Medications: Scheduled:  amLODipine  5 mg Oral q morning   aspirin  81 mg Oral Daily   atorvastatin  10 mg Oral q morning   bisacodyl  10 mg Oral Daily   clopidogrel   75 mg Oral Daily   DULoxetine  60 mg Oral BID   finasteride  5 mg Oral q morning   Gerhardt's butt cream   Topical TID   insulin aspart  0-5 Units Subcutaneous QHS   insulin aspart  0-9 Units Subcutaneous TID WC   insulin NPH Human  15 Units Subcutaneous QHS   insulin NPH Human  30 Units Subcutaneous QAC breakfast   metoprolol tartrate  25 mg Oral BID   metroNIDAZOLE  500 mg Oral Q12H   naloxegol oxalate  12.5 mg Oral Daily   polyethylene glycol  34 g Oral Daily   tapentadol  200 mg Oral Q12H   traZODone  150 mg Oral QHS   Continuous:  sodium chloride      Results for orders placed or performed during the hospital encounter of 10/10/22 (from the past 24 hour(s))  Urinalysis, Routine w reflex microscopic -Urine, Clean Catch     Status: Abnormal   Collection Time: 10/10/22  9:26 PM  Result Value Ref Range   Color, Urine YELLOW YELLOW   APPearance CLEAR CLEAR   Specific Gravity, Urine 1.023 1.005 - 1.030   pH 7.0 5.0 - 8.0   Glucose, UA 50 (A) NEGATIVE mg/dL   Hgb urine dipstick NEGATIVE NEGATIVE   Bilirubin Urine NEGATIVE NEGATIVE   Ketones, ur 5 (A) NEGATIVE mg/dL   Protein, ur 413 (A) NEGATIVE mg/dL   Nitrite NEGATIVE NEGATIVE   Leukocytes,Ua NEGATIVE NEGATIVE   RBC / HPF 0-5 0 - 5 RBC/hpf   WBC, UA 0-5 0 - 5 WBC/hpf   Bacteria, UA NONE SEEN NONE SEEN   Squamous Epithelial / HPF 0-5 0 - 5 /HPF   Hyaline Casts, UA PRESENT   CBG monitoring, ED     Status: None   Collection Time: 10/10/22 10:28 PM  Result Value Ref Range   Glucose-Capillary 89 70 - 99 mg/dL  Glucose, capillary     Status: Abnormal   Collection Time: 10/11/22 12:59 AM  Result Value Ref Range   Glucose-Capillary 147 (H) 70 - 99 mg/dL  Hemoglobin K4M     Status: Abnormal   Collection Time: 10/11/22  3:22 AM  Result Value Ref Range   Hgb A1c MFr Bld 5.8 (H) 4.8 - 5.6 %   Mean Plasma Glucose 119.76 mg/dL  Glucose, capillary     Status: Abnormal   Collection Time: 10/11/22  7:40 AM  Result Value Ref Range    Glucose-Capillary 165 (H) 70 - 99 mg/dL  Glucose, capillary     Status: Abnormal   Collection Time: 10/11/22 11:33 AM  Result Value Ref Range   Glucose-Capillary 199 (H) 70 - 99 mg/dL  Glucose, capillary  Status: Abnormal   Collection Time: 10/11/22  5:23 PM  Result Value Ref Range   Glucose-Capillary 140 (H) 70 - 99 mg/dL     CT ABDOMEN PELVIS W CONTRAST  Result Date: 10/10/2022 CLINICAL DATA:  clinical constipation.  Abdominal discomfort. EXAM: CT ABDOMEN AND PELVIS WITH CONTRAST TECHNIQUE: Multidetector CT imaging of the abdomen and pelvis was performed using the standard protocol following bolus administration of intravenous contrast. RADIATION DOSE REDUCTION: This exam was performed according to the departmental dose-optimization program which includes automated exposure control, adjustment of the mA and/or kV according to patient size and/or use of iterative reconstruction technique. CONTRAST:  OMNIPAQUE IOHEXOL 300 MG/ML  SOLN COMPARISON:  None Available. FINDINGS: Lower chest: Centrilobular and paraseptal emphysema. Right lower lobe calcified granulomas. Motion degraded evaluation of the lung bases. 5 mm left lower lobe pulmonary nodule on 08/05 is most likely a subpleural lymph node adjacent the left major fissure. No specific imaging follow-up indicated per Fleischner criteria. Normal heart size without pericardial or pleural effusion. Three vessel coronary artery calcification. Hepatobiliary: Normal liver. Small dependent gallstones without acute cholecystitis or biliary duct dilatation. Pancreas: The pancreas is markedly atrophic/fatty replaced. Spleen: Normal in size, without focal abnormality. Adrenals/Urinary Tract: Normal adrenal glands. Mild renal cortical thinning bilaterally. Bilateral too small to characterize renal lesions are most likely cysts . In the absence of clinically indicated signs/symptoms require(s) no independent follow-up. No hydronephrosis. Normal urinary  bladder. Stomach/Bowel: Normal stomach, without wall thickening. 7.6 cm stool ball in the rectum. Mild perirectal edema identified posteriorly on 78/2. No wall thickening. Large stool burden identified more proximally, including within the sigmoid colon. Normal terminal ileum. The appendix is upper normal sized on 62/2 but no periappendiceal inflammation is seen. Normal small bowel. Vascular/Lymphatic: Advanced aortic and branch vessel atherosclerosis. No abdominopelvic adenopathy. Reproductive: Normal prostate. Other: No significant free fluid.  No free intraperitoneal air. Musculoskeletal: Right greater than left hip osteoarthritis. Lower thoracic and upper lumbar degenerative disc disease. IMPRESSION: 1. Rectal stool ball is most consistent with fecal impaction. Mild perirectal edema suggests stercoral proctitis. 2. Motion degradation, primarily involving the lung bases. 3. Cholelithiasis 4. Markedly atrophic pancreas 5. Aortic atherosclerosis (ICD10-I70.0), coronary artery atherosclerosis and emphysema (ICD10-J43.9). Electronically Signed   By: Jeronimo Greaves M.D.   On: 10/10/2022 18:56    ROS:  As stated above in the HPI otherwise negative.  Blood pressure (!) 155/66, pulse (!) 55, temperature (!) 97.5 F (36.4 C), resp. rate 18, height 6' (1.829 m), weight 91.3 kg, SpO2 98%.    PE: Gen: NAD, Alert and Oriented HEENT:  [place]/AT, EOMI Neck: Supple, no LAD Lungs: CTA Bilaterally CV: RRR without M/G/R ABD: Soft, NTND, +BS Ext: No C/C/E  Assessment/Plan: 1) Fecal impaction. 2) Opioid-induced constipation. 3) Multiple medical problems.   The patient declined a rectal examination as he had multiple enemas.  It is unclear how much, if any of the stool ball passed.  With such a large stool ball the most effective way to evacuate the stool ball is with a digital disimpaction.  He declined this intervention.  Plan: 1) Continue with enemas.  Airika Alkhatib D 10/11/2022, 9:11 PM

## 2022-10-12 DIAGNOSIS — R269 Unspecified abnormalities of gait and mobility: Secondary | ICD-10-CM | POA: Diagnosis not present

## 2022-10-12 DIAGNOSIS — G894 Chronic pain syndrome: Secondary | ICD-10-CM | POA: Diagnosis not present

## 2022-10-12 DIAGNOSIS — K5641 Fecal impaction: Secondary | ICD-10-CM | POA: Diagnosis not present

## 2022-10-12 DIAGNOSIS — E8581 Light chain (AL) amyloidosis: Secondary | ICD-10-CM

## 2022-10-12 DIAGNOSIS — N1831 Chronic kidney disease, stage 3a: Secondary | ICD-10-CM | POA: Diagnosis not present

## 2022-10-12 LAB — BASIC METABOLIC PANEL
Anion gap: 10 (ref 5–15)
BUN: 24 mg/dL — ABNORMAL HIGH (ref 8–23)
CO2: 27 mmol/L (ref 22–32)
Calcium: 8.5 mg/dL — ABNORMAL LOW (ref 8.9–10.3)
Chloride: 100 mmol/L (ref 98–111)
Creatinine, Ser: 1.43 mg/dL — ABNORMAL HIGH (ref 0.61–1.24)
GFR, Estimated: 51 mL/min — ABNORMAL LOW (ref >60)
Glucose, Bld: 123 mg/dL — ABNORMAL HIGH (ref 70–99)
Potassium: 4 mmol/L (ref 3.5–5.1)
Sodium: 137 mmol/L (ref 135–145)

## 2022-10-12 LAB — GLUCOSE, CAPILLARY
Glucose-Capillary: 37 mg/dL — CL (ref 70–99)
Glucose-Capillary: 42 mg/dL — CL (ref 70–99)
Glucose-Capillary: 77 mg/dL (ref 70–99)
Glucose-Capillary: 165 mg/dL — ABNORMAL HIGH (ref 70–99)
Glucose-Capillary: 115 mg/dL — ABNORMAL HIGH (ref 70–99)
Glucose-Capillary: 93 mg/dL (ref 70–99)
Glucose-Capillary: 200 mg/dL — ABNORMAL HIGH (ref 70–99)

## 2022-10-12 LAB — CBC
HCT: 45.1 % (ref 39.0–52.0)
Hemoglobin: 14.9 g/dL (ref 13.0–17.0)
MCH: 34.1 pg — ABNORMAL HIGH (ref 26.0–34.0)
MCHC: 33 g/dL (ref 30.0–36.0)
MCV: 103.2 fL — ABNORMAL HIGH (ref 80.0–100.0)
Platelets: 94 10*3/uL — ABNORMAL LOW (ref 150–400)
RBC: 4.37 MIL/uL (ref 4.22–5.81)
RDW: 13.1 % (ref 11.5–15.5)
WBC: 9.7 10*3/uL (ref 4.0–10.5)
nRBC: 0 % (ref 0.0–0.2)

## 2022-10-12 LAB — MAGNESIUM: Magnesium: 2.6 mg/dL — ABNORMAL HIGH (ref 1.7–2.4)

## 2022-10-12 LAB — PHOSPHORUS: Phosphorus: 3.2 mg/dL (ref 2.5–4.6)

## 2022-10-12 NOTE — Plan of Care (Signed)

## 2022-10-12 NOTE — Progress Notes (Signed)
UNASSIGNED PATIENT Subjective: Patient seems to be doing better today.  He had a BM yesterday and denies having any abdominal pain nausea vomiting.  He claims he has to take multiple narcotics because of his history of MS and diabetic neuropathy and chronic pain syndrome.  Objective: Vital signs in last 24 hours: Temp:  [97.3 F (36.3 C)-97.9 F (36.6 C)] 97.8 F (36.6 C) (09/04 0539) Pulse Rate:  [55-66] 56 (09/04 0539) Resp:  [17-18] 17 (09/04 0539) BP: (117-177)/(59-70) 127/59 (09/04 0539) SpO2:  [96 %-99 %] 99 % (09/04 0539) Weight:  [94.3 kg] 94.3 kg (09/04 0500) Last BM Date : 10/11/22  Intake/Output from previous day: 09/03 0701 - 09/04 0700 In: 600 [P.O.:600] Out: 650 [Urine:650] Intake/Output this shift: No intake/output data recorded.  General appearance: alert, cooperative, appears stated age, no distress, and pale Resp: clear to auscultation bilaterally Cardio: regular rate and rhythm, S1, S2 normal, no murmur, click, rub or gallop GI: soft, non-tender; bowel sounds normal; no masses,  no organomegaly Extremities: extremities normal, atraumatic, no cyanosis or edema  Lab Results: Recent Labs    10/10/22 1645 10/12/22 0332  WBC 13.7* 9.7  HGB 15.8 14.9  HCT 46.7 45.1  PLT 104* 94*   BMET Recent Labs    10/10/22 1645 10/12/22 0332  NA 136 137  K 4.3 4.0  CL 101 100  CO2 24 27  GLUCOSE 157* 123*  BUN 33* 24*  CREATININE 1.50* 1.43*  CALCIUM 8.8* 8.5*   LFT Recent Labs    10/10/22 1645  PROT 8.3*  ALBUMIN 3.2*  AST 37  ALT 26  ALKPHOS 239*  BILITOT 1.0   PT/INR No results for input(s): "LABPROT", "INR" in the last 72 hours. Hepatitis Panel No results for input(s): "HEPBSAG", "HCVAB", "HEPAIGM", "HEPBIGM" in the last 72 hours. C-Diff No results for input(s): "CDIFFTOX" in the last 72 hours. No results for input(s): "CDIFFPCR" in the last 72 hours. Fecal Lactopherrin No results for input(s): "FECLLACTOFRN" in the last 72  hours.  Studies/Results: CT ABDOMEN PELVIS W CONTRAST  Result Date: 10/10/2022 CLINICAL DATA:  clinical constipation.  Abdominal discomfort. EXAM: CT ABDOMEN AND PELVIS WITH CONTRAST TECHNIQUE: Multidetector CT imaging of the abdomen and pelvis was performed using the standard protocol following bolus administration of intravenous contrast. RADIATION DOSE REDUCTION: This exam was performed according to the departmental dose-optimization program which includes automated exposure control, adjustment of the mA and/or kV according to patient size and/or use of iterative reconstruction technique. CONTRAST:  OMNIPAQUE IOHEXOL 300 MG/ML  SOLN COMPARISON:  None Available. FINDINGS: Lower chest: Centrilobular and paraseptal emphysema. Right lower lobe calcified granulomas. Motion degraded evaluation of the lung bases. 5 mm left lower lobe pulmonary nodule on 08/05 is most likely a subpleural lymph node adjacent the left major fissure. No specific imaging follow-up indicated per Fleischner criteria. Normal heart size without pericardial or pleural effusion. Three vessel coronary artery calcification. Hepatobiliary: Normal liver. Small dependent gallstones without acute cholecystitis or biliary duct dilatation. Pancreas: The pancreas is markedly atrophic/fatty replaced. Spleen: Normal in size, without focal abnormality. Adrenals/Urinary Tract: Normal adrenal glands. Mild renal cortical thinning bilaterally. Bilateral too small to characterize renal lesions are most likely cysts . In the absence of clinically indicated signs/symptoms require(s) no independent follow-up. No hydronephrosis. Normal urinary bladder. Stomach/Bowel: Normal stomach, without wall thickening. 7.6 cm stool ball in the rectum. Mild perirectal edema identified posteriorly on 78/2. No wall thickening. Large stool burden identified more proximally, including within the sigmoid colon. Normal  terminal ileum. The appendix is upper normal sized on 62/2  but no periappendiceal inflammation is seen. Normal small bowel. Vascular/Lymphatic: Advanced aortic and branch vessel atherosclerosis. No abdominopelvic adenopathy. Reproductive: Normal prostate. Other: No significant free fluid.  No free intraperitoneal air. Musculoskeletal: Right greater than left hip osteoarthritis. Lower thoracic and upper lumbar degenerative disc disease. IMPRESSION: 1. Rectal stool ball is most consistent with fecal impaction. Mild perirectal edema suggests stercoral proctitis. 2. Motion degradation, primarily involving the lung bases. 3. Cholelithiasis 4. Markedly atrophic pancreas 5. Aortic atherosclerosis (ICD10-I70.0), coronary artery atherosclerosis and emphysema (ICD10-J43.9). Electronically Signed   By: Jeronimo Greaves M.D.   On: 10/10/2022 18:56    Medications: I have reviewed the patient's current medications. Prior to Admission:  Medications Prior to Admission  Medication Sig Dispense Refill Last Dose   amLODipine (NORVASC) 5 MG tablet Take 5 mg by mouth every morning.    10/09/2022   aspirin 81 MG tablet Take 81 mg by mouth daily.   10/09/2022   atorvastatin (LIPITOR) 10 MG tablet Take 10 mg by mouth every morning.    10/09/2022   clopidogrel (PLAVIX) 75 MG tablet Take 75 mg by mouth daily.   10/09/2022   DULoxetine (CYMBALTA) 60 MG capsule Take 60 mg by mouth 2 (two) times daily.   10/09/2022   finasteride (PROSCAR) 5 MG tablet Take 5 mg by mouth every morning.    10/09/2022   Insulin NPH, Human,, Isophane, (HUMULIN N KWIKPEN) 100 UNIT/ML Kiwkpen Inject 10-30 Units into the skin 2 (two) times daily. Inject 30 units in the morning and Inject 10-15 units in the evening.   10/10/2022   metoprolol tartrate (LOPRESSOR) 25 MG tablet Take 25 mg by mouth 2 (two) times daily.   10/09/2022   oxyCODONE (ROXICODONE) 15 MG immediate release tablet Take 1 tablet (15 mg total) by mouth every 6 (six) hours as needed for pain. 120 tablet 0 10/10/2022   Tapentadol HCl (NUCYNTA ER) 200 MG TB12 Take 1  tablet (200 mg total) by mouth every 12 (twelve) hours. 60 tablet 0 10/10/2022   traZODone (DESYREL) 150 MG tablet Take 150 mg by mouth at bedtime.   10/09/2022   doxycycline (VIBRA-TABS) 100 MG tablet Take 1 tablet (100 mg total) by mouth 2 (two) times daily. (Patient not taking: Reported on 10/10/2022) 20 tablet 0 Not Taking   nitroGLYCERIN (NITRO-DUR) 0.4 mg/hr patch Apply patch to the white flesh of the toe daily. (Patient not taking: Reported on 10/10/2022) 30 patch 0 Not Taking   Scheduled:  amLODipine  5 mg Oral q morning   aspirin  81 mg Oral Daily   atorvastatin  10 mg Oral q morning   bisacodyl  10 mg Oral Daily   clopidogrel  75 mg Oral Daily   DULoxetine  60 mg Oral BID   finasteride  5 mg Oral q morning   Gerhardt's butt cream   Topical TID   insulin aspart  0-5 Units Subcutaneous QHS   insulin aspart  0-9 Units Subcutaneous TID WC   insulin NPH Human  15 Units Subcutaneous QHS   insulin NPH Human  30 Units Subcutaneous QAC breakfast   metoprolol tartrate  25 mg Oral BID   metroNIDAZOLE  500 mg Oral Q12H   polyethylene glycol  34 g Oral Daily   tapentadol  200 mg Oral Q12H   traZODone  150 mg Oral QHS   Continuous:  sodium chloride     VOZ:DGUYQI chloride, acetaminophen **OR** acetaminophen, melatonin, ondansetron **  OR** ondansetron (ZOFRAN) IV, mouth rinse, oxyCODONE  Assessment/Plan: 1) Fecal impaction improved with MiraLAX and smog enemas.  Agree with Mov[employer]ik; advance his diet as tolerated 2) IDDM/diabetic ulcer of the toe.Marland Kitchen 3) Multiple sclerosis. 4) Hypertension hyperlipidemia. 5) Primary amyloidosis. 6) CKD Stage 3 a. 7_ Obesity.  LOS: 1 day   [name] 10/12/2022, 7:22 AM

## 2022-10-12 NOTE — Hospital Course (Addendum)
The patient is a 77 year old Caucasian male with past medical history significant for but not limited to multiple sclerosis, hypertension, diabetes mellitus type 1 since age of 23, CKD stage IIIa baseline creatinine 1.3, history of prior CVA, chronic pain syndrome on Nucynta and oxycodone, hyperlipidemia, amyloidosis as well as other comorbidities who presented to the ED with constipation and abdominal pain.  He has been on multiple narcotic medication had chronic constipation and usually takes MiraLAX almost on a daily basis.  Last bowel movement prior to admission was 2 to 3 days prior and he reported increasing abdominal discomfort despite taking regular MiraLAX and stool softeners as well as other laxatives.  In the ED CT his abdomen pelvis was done and demonstrated 7.6 cm stool ball in the rectum with mild perirectal edema posteriorly as well consistent with fecal impaction.  He was admitted for further evaluation and GI was consulted.  Patient refused digital disimpaction so was given multiple laxatives and enemas which have now improved his symptoms.  KUB showed no significant stool burden. Anticipating D/C in the next 24 hours given that we want to see how he tolerates his Soft Diet for at least 24 hours.   Assessment and Plan:  Fecal Impaction, improving  -Patient presented with abdominal pain and constipation . -Continue with PO miralax, SMOG enemas, dulcolax.  -Received Naloxegol Oxalate 12.5 mg as pt is on chronic long term opiates.  -Continue Metronidazole 500 mg po 12h for stercoral colitis.  -Continue CLD and started SOFT now and will see he tolerates today and if stable can D/C in the AM  -Pt states he has been having bowel movements this week.  -He's probably leaking around this stool ball in his rectum. -Consider repeat CT abd/pelvis without contrast to check resolution of fecal impaction however KUB showing no signfiicant stool burden now.  -Check KUB in the AM and it showed "No  abnormal bowel dilatation. No significant stool burden is noted. 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  -Podiatry did not think his ulcer was infected.  -Pt's foot appears improved compared to picture taken in podiatry office on 10-05-2022. -C/w Blood Sugar Control  -WOC nurse evaluated and recommending "Clean bilateral 2nd digit ulcers with NS, paint with Betadine twice daily. May leave open to air or wrap with dry gauze and Kerlix roll gauze, whichever is preferred.    Patient also noted to have dusky appearance to sacrum/coccyx and buttocks.  This appears to be chronic tissue damage to this area.    Will order Gerhardt's Butt Cream 3 times a day and prn soiling."   Chronic Pain Syndrome: -On chronic Tapentadol 200 mg po q12h and Oxycodone 15 mg po q6hprn Moderate Pain -His chronic opiate therapy is likely the cause of his chronic constipation. -Continue Naloxegol Oxalate 12.5 mg po Daily    Primary Amyloidosis (HCC) -Stable. Not on any specific therapy.   Multiple Sclerosis (HCC) -Chronic. Pt not on any specific therapy for his MS.   Abnormality of Gait -Pt able to stand and transfer. Walks with assistance a short distance to kitchen(no more than 20 feet). -Obtain PT/OT Evaluation and they have signed off as patient feels he is close to his baseline.   Essential Hypertension -Continue Amlodipine 5 mg po Daily, Finasteride 5  mg po qMorning, and Metoprolol Tartrate 25 mg po BID -Continue to Monitor BP per Protocol -Last BP reading was a little soft at 107/53   Pure Hypercholesterolemia -Continue with Atorvastatin 10 mg daily.   Type 1 Diabetes Mellitus with complications (HCC) -Continue with insulin NPH 30 units subcu before breakfast and 15 units  subcu before bedtime as well as sensitive NovoLog sliding scale insulin before meals and at bedtime -Continue monitor CBGs and blood sugars per protocol -CBG trend: Recent Labs  Lab 10/13/22 0710 10/13/22 1141 10/13/22 1646 10/13/22 1716 10/13/22 2205 10/14/22 0723 10/14/22 1132  GLUCAP 277* 362* 43* 52* 385* 248* 320*  -Glucose Trend: Recent Labs  Lab 10/10/22 1645 10/12/22 0332 10/13/22 0402 10/14/22 0314  GLUCOSE 157* 123* 102* 248*   CKD Stage 3a -Has a baseline Serum Creatine of 1.3; BUN/Cr Trend: Recent Labs  Lab 10/10/22 1645 10/12/22 0332 10/13/22 0402 10/14/22 0314  BUN 33* 24* 21 23  CREATININE 1.50* 1.43* 1.39* 1.35*  -Avoid Nephrotoxic Medications, Contrast Dyes, Hypotension and Dehydration to Ensure Adequate Renal Perfusion and will need to Renally Adjust Meds -Continue to Monitor and Trend Renal Function carefully and repeat CMP in the AM   Hx of CVA with Residual Left Sided Weakness -C/w ASA and Statin  CAD -C/w ASA 81 mg po Daily, Clopidogrel 75 mg po Daily, and Atrovastatin 10 mg po Daily  -No CP currently   Thrombocytopenia -Platelet Count Trend: Recent Labs  Lab 10/10/22 1645 10/12/22 0332 10/13/22 0542 10/14/22 0314  PLT 104* 94* 93* 86*  -Continue to Monitor for S/Sx of Bleeding; No overt bleeding noted -On SCDs for VTE Prophylaxis -Repeat CBC in the AM   Hypoalbuminemia -[name] Albumin Trend: Recent Labs  Lab 10/10/22 1645 10/13/22 0402 10/14/22 0314  ALBUMIN 3.2* 2.7* 2.4*  -Continue to Monitor and Trend and repeat CMP in the AM  Overweight -Complicates overall prognosis and care -Estimated body mass index is 27.78 kg/m as calculated from the following:   Height as of this encounter: 6' (1.829 m).   Weight as of this encounter: 92.9 kg.  -Weight Loss and Dietary Counseling given

## 2022-10-12 NOTE — Plan of Care (Signed)
  Problem: Activity: Goal: Risk for activity intolerance will decrease Outcome: Progressing   Problem: Nutrition: Goal: Adequate nutrition will be maintained Outcome: Progressing   Problem: Safety: Goal: Ability to remain free from injury will improve Outcome: Progressing   

## 2022-10-12 NOTE — Progress Notes (Signed)
PROGRESS NOTE    [name] M Hellen Jr.  BJY:[id number] DOB: Mar 19, 1945 DOA: 10/10/2022 PCP: Geoffry Paradise, MD   Brief Narrative:  The patient is a 77 year old Caucasian male with past medical history significant for but not limited to multiple sclerosis, hypertension, diabetes mellitus type 1 since age of 24, CKD stage IIIa baseline creatinine 1.3, history of prior CVA, chronic pain syndrome on Nucynta and oxycodone, hyperlipidemia, amyloidosis as well as other comorbidities who presented to the ED with constipation and abdominal pain.  He has been on multiple narcotic medication had chronic constipation and usually takes MiraLAX almost on a daily basis.  Last bowel movement prior to admission was 2 to 3 days prior and he reported increasing abdominal discomfort despite taking regular MiraLAX and stool softeners as well as other laxatives.  In the ED CT his abdomen pelvis was done and demonstrated 7.6 cm stool ball in the rectum with mild perirectal edema posteriorly as well consistent with fecal impaction.  He was admitted for further evaluation and GI was consulted.  Patient refused digital disimpaction so was given multiple laxatives and enemas which have now improved his symptoms.  Will repeat a KUB in the morning.  Assessment and Plan:  Fecal Impaction -Patient presented with abdominal pain and constipation . -Continue with PO miralax, SMOG enemas, dulcolax.  -Continue Naloxegol Oxalate 12.5 mg as pt is on chronic long term opiates.  -Continue Metronidazole 500 mg po 12h for stercoral colitis.  -Continue CLD and started SOFT now -Pt states he has been having bowel movements this week.  -He's probably leaking around this stool ball in his rectum. -Consider repeat CT abd/pelvis without contrast to check resolution of fecal impaction.  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  -Podiatry did not think his ulcer was infected.  -Pt's foot appears improved compared to picture taken in podiatry office on 10-05-2022. -C/w Blood Sugar Control  -WOC nurse evaluated and recommending :Clean bilateral 2nd digit ulcers with NS, paint with Betadine twice daily. May leave open to air or wrap with dry gauze and Kerlix roll gauze, whichever is preferred.    Patient also noted to have dusky appearance to sacrum/coccyx and buttocks.  This appears to be chronic tissue damage to this area.    Will order Gerhardt's Butt Cream 3 times a day and prn soiling."   Chronic Pain Syndrome: -On chronic Tapentadol 200 mg po q12h and Oxycodone 15 mg po q6hprn Moderate Pain -His chronic opiate therapy could be the cause of his chronic constipation.  -Continue Naloxegol Oxalate 12.5 mg po Daily    Primary Amyloidosis (HCC) -Stable. Not on any specific therapy.   Multiple Sclerosis (HCC) -Chronic. Pt not on any specific therapy for his MS.   Abnormality of Gait -Pt able to stand and transfer. Walks with assistance a short distance to kitchen(no more than 20 feet). -Obtain PT/OT Evaluation    Essential Hypertension -Continue Amlodipine 5 mg po Daily, Finasteride 5 mg po qMorning, and Metoprolol Tartrate 25 mg po BID -Continue to Monitor BP per Protocol -Last BP reading was 123/71   Pure Hypercholesterolemia -Continue with Atorvastatin 10 mg daily.   Type 1 Diabetes Mellitus with complications (HCC) -Continue with insulin NPH 30 units subcu before breakfast and 15 units subcu before bedtime as well as sensitive NovoLog  sliding scale insulin before meals and at bedtime -Continue monitor CBGs and blood sugars per protocol -CBG trend: Recent Labs  Lab 10/11/22 2152 10/12/22 0216 10/12/22 0234 10/12/22 0304 10/12/22 0744 10/12/22 1241 10/12/22 1608  GLUCAP 223* 37* 42* 77 165* 115* 93  -Glucose Trend: Recent Labs  Lab  10/10/22 1645 10/12/22 0332  GLUCOSE 157* 123*   CKD Stage 3a -Has a baseline Serum Creatine of 1.3; BUN/Cr Trend: Recent Labs  Lab 10/10/22 1645 10/12/22 0332  BUN 33* 24*  CREATININE 1.50* 1.43*  -Avoid Nephrotoxic Medications, Contrast Dyes, Hypotension and Dehydration to Ensure Adequate Renal Perfusion and will need to Renally Adjust Meds -Continue to Monitor and Trend Renal Function carefully and repeat CMP in the AM   Hx of CVA with Residual Left Sided Weakness -C/w ASA and Statin  CAD -C/w ASA 81 mg po Daily, Clopidogrel 75 mg po Daily, and Atrovastatin 10 mg po Daily  -No CP currently   Thrombocytopenia -Platelet Count Trend: Recent Labs  Lab 10/10/22 1645 10/12/22 0332  PLT 104* 94*  -Continue to Monitor for S/Sx of Bleeding; No overt bleeding noted -On SCDs for VTE Prophylaxis -Repeat CBC in the AM   Hypoalbuminemia -[name] Albumin Trend: Recent Labs  Lab 10/10/22 1645  ALBUMIN 3.2*  -Continue to Monitor and Trend and repeat CMP in the AM  Overweight -Complicates overall prognosis and care -Estimated body mass index is 28.2 kg/m as calculated from the following:   Height as of this encounter: 6' (1.829 m).   Weight as of this encounter: 94.3 kg.  -Weight Loss and Dietary Counseling given   DVT prophylaxis: SCDs Start: 10/10/22 2327    Code Status: Full Code Family Communication: No family present at bedside  Disposition Plan:  Level of care: Med-Surg Status is: Inpatient Remains inpatient appropriate because: Needs further clinical improvement and clearance by gastroenterology  Consultants:  Gastroenterology  Procedures:  As delineated as above  Antimicrobials:  Anti-infectives (From admission, onward)    Start     Dose/Rate Route Frequency Ordered Stop   10/10/22 2330  metroNIDAZOLE (FLAGYL) tablet 500 mg        500 mg Oral Every 12 hours 10/10/22 2326          Subjective: Seen and examined at bedside thinks he is doing little  bit better.  States he had a bowel movement last night.  No nausea or vomiting.  Feels okay.  States his abdomen is much softer today and continues have some abdominal cramping but not as much.  No other concerns or complaints at this time.  Objective: Vitals:   10/12/22 0116 10/12/22 0500 10/12/22 0539 10/12/22 1326  BP: (!) 117/59  (!) 127/59 123/71  Pulse: (!) 55  (!) 56 (!) 56  Resp: 17  17 18   Temp: (!) 97.3 F (36.3 C)  97.8 F (36.6 C) 98.1 F (36.7 C)  TempSrc: Oral  Oral   SpO2: 96%  99% 98%  Weight:  94.3 kg    Height:        Intake/Output Summary (Last 24 hours) at 10/12/2022 1840 Last data filed at 10/12/2022 1152 Gross per 24 hour  Intake --  Output 800 ml  Net -800 ml   Filed Weights   10/10/22 2357 10/11/22 0448 10/12/22 0500  Weight: 89.9 kg 91.3 kg 94.3 kg   Examination: Physical Exam:  Constitutional: WN/WD overweight chronically ill-appearing Caucasian elderly male in no acute distress appears calm Respiratory: Diminished to auscultation bilaterally, no wheezing, rales,  rhonchi or crackles. Normal respiratory effort and patient is not tachypenic. No accessory muscle use.  Unlabored breathing Cardiovascular: RRR, no murmurs / rubs / gallops. S1 and S2 auscultated. No extremity edema. Abdomen: Soft, non-tender, distended secondary to body habitus. Bowel sounds positive.  GU: Deferred. Musculoskeletal: No clubbing / cyanosis of digits/nails. No joint deformity upper and lower extremities. Skin: No rashes, lesions, ulcers on limited skin evaluation. No induration; Warm and dry.  Neurologic: CN 2-12 grossly intact with no focal deficits. Romberg sign and cerebellar reflexes not assessed.  Psychiatric: Normal judgment and insight. Alert and oriented x 3. Normal mood and appropriate affect.   Data Reviewed: I have personally reviewed following labs and imaging studies  CBC: Recent Labs  Lab 10/10/22 1645 10/12/22 0332  WBC 13.7* 9.7  NEUTROABS 11.4*  --    HGB 15.8 14.9  HCT 46.7 45.1  MCV 99.6 103.2*  PLT 104* 94*   Basic Metabolic Panel: Recent Labs  Lab 10/10/22 1645 10/12/22 0332  NA 136 137  K 4.3 4.0  CL 101 100  CO2 24 27  GLUCOSE 157* 123*  BUN 33* 24*  CREATININE 1.50* 1.43*  CALCIUM 8.8* 8.5*  MG  --  2.6*  PHOS  --  3.2   GFR: Estimated Creatinine Clearance: 53.2 mL/min (A) (by C-G formula based on SCr of 1.43 mg/dL (H)). Liver Function Tests: Recent Labs  Lab 10/10/22 1645  AST 37  ALT 26  ALKPHOS 239*  BILITOT 1.0  PROT 8.3*  ALBUMIN 3.2*   Recent Labs  Lab 10/10/22 1645  LIPASE 20   No results for input(s): "AMMONIA" in the last 168 hours. Coagulation Profile: No results for input(s): "INR", "PROTIME" in the last 168 hours. Cardiac Enzymes: No results for input(s): "CKTOTAL", "CKMB", "CKMBINDEX", "TROPONINI" in the last 168 hours. BNP (last 3 results) No results for input(s): "PROBNP" in the last 8760 hours. HbA1C: Recent Labs    10/11/22 0322  HGBA1C 5.8*   CBG: Recent Labs  Lab 10/12/22 0234 10/12/22 0304 10/12/22 0744 10/12/22 1241 10/12/22 1608  GLUCAP 42* 77 165* 115* 93   Lipid Profile: No results for input(s): "CHOL", "HDL", "LDLCALC", "TRIG", "CHOLHDL", "LDLDIRECT" in the last 72 hours. Thyroid Function Tests: No results for input(s): "TSH", "T4TOTAL", "FREET4", "T3FREE", "THYROIDAB" in the last 72 hours. Anemia Panel: No results for input(s): "VITAMINB12", "FOLATE", "FERRITIN", "TIBC", "IRON", "RETICCTPCT" in the last 72 hours. Sepsis Labs: No results for input(s): "PROCALCITON", "LATICACIDVEN" in the last 168 hours.  No results found for this or any previous visit (from the past 240 hour(s)).   Radiology Studies: No results found.  Scheduled Meds:  amLODipine  5 mg Oral q morning   aspirin  81 mg Oral Daily   atorvastatin  10 mg Oral q morning   bisacodyl  10 mg Oral Daily   clopidogrel  75 mg Oral Daily   DULoxetine  60 mg Oral BID   finasteride  5 mg Oral q  morning   Gerhardt's butt cream   Topical TID   insulin aspart  0-5 Units Subcutaneous QHS   insulin aspart  0-9 Units Subcutaneous TID WC   insulin NPH Human  15 Units Subcutaneous QHS   insulin NPH Human  30 Units Subcutaneous QAC breakfast   metoprolol tartrate  25 mg Oral BID   metroNIDAZOLE  500 mg Oral Q12H   polyethylene glycol  34 g Oral Daily   tapentadol  200 mg Oral Q12H   traZODone  150 mg  Oral QHS   Continuous Infusions:  sodium chloride      LOS: 1 day   Marguerita Merles, DO [facility] Available via Epic secure chat 7am-7pm After these hours, please refer to coverage provider listed on [email or web address] 10/12/2022, 6:40 PM

## 2022-10-12 NOTE — Plan of Care (Signed)
  Problem: Education: Goal: Individualized Educational Video(s) Outcome: Progressing   Problem: Coping: Goal: Ability to adjust to condition or change in health will improve Outcome: Progressing   Problem: Fluid Volume: Goal: Ability to maintain a balanced intake and output will improve Outcome: Progressing   Problem: Health Behavior/Discharge Planning: Goal: Ability to identify and utilize available resources and services will improve Outcome: Progressing Goal: Ability to manage health-related needs will improve Outcome: Progressing   Problem: Metabolic: Goal: Ability to maintain appropriate glucose levels will improve Outcome: Progressing   Problem: Nutritional: Goal: Maintenance of adequate nutrition will improve Outcome: Progressing Goal: Progress toward achieving an optimal weight will improve Outcome: Progressing   Problem: Skin Integrity: Goal: Risk for impaired skin integrity will decrease Outcome: Progressing   Problem: Tissue Perfusion: Goal: Adequacy of tissue perfusion will improve Outcome: Progressing   Problem: Education: Goal: Knowledge of General Education information will improve Description: Including pain rating scale, medication(s)/side effects and non-pharmacologic comfort measures Outcome: Progressing   Problem: Health Behavior/Discharge Planning: Goal: Ability to manage health-related needs will improve Outcome: Progressing   Problem: Clinical Measurements: Goal: Ability to maintain clinical measurements within normal limits will improve Outcome: Progressing Goal: Will remain free from infection Outcome: Progressing Goal: Diagnostic test results will improve Outcome: Progressing Goal: Respiratory complications will improve Outcome: Progressing Goal: Cardiovascular complication will be avoided Outcome: Progressing   Problem: Activity: Goal: Risk for activity intolerance will decrease Outcome: Progressing   Problem: Nutrition: Goal:  Adequate nutrition will be maintained Outcome: Progressing   Problem: Coping: Goal: Level of anxiety will decrease Outcome: Progressing   Problem: Elimination: Goal: Will not experience complications related to bowel motility Outcome: Progressing Goal: Will not experience complications related to urinary retention Outcome: Progressing   Problem: Pain Managment: Goal: General experience of comfort will improve Outcome: Progressing   Problem: Safety: Goal: Ability to remain free from injury will improve Outcome: Progressing   Problem: Skin Integrity: Goal: Risk for impaired skin integrity will decrease Outcome: Progressing   

## 2022-10-13 ENCOUNTER — Inpatient Hospital Stay (HOSPITAL_COMMUNITY): Payer: Medicare Other

## 2022-10-13 DIAGNOSIS — K5641 Fecal impaction: Secondary | ICD-10-CM | POA: Diagnosis not present

## 2022-10-13 DIAGNOSIS — R269 Unspecified abnormalities of gait and mobility: Secondary | ICD-10-CM | POA: Diagnosis not present

## 2022-10-13 DIAGNOSIS — G894 Chronic pain syndrome: Secondary | ICD-10-CM | POA: Diagnosis not present

## 2022-10-13 DIAGNOSIS — N1831 Chronic kidney disease, stage 3a: Secondary | ICD-10-CM | POA: Diagnosis not present

## 2022-10-13 LAB — CBC WITH DIFFERENTIAL/PLATELET
Abs Immature Granulocytes: 0.02 10*3/uL (ref 0.00–0.07)
Basophils Absolute: 0 10*3/uL (ref 0.0–0.1)
Basophils Relative: 0 %
Eosinophils Absolute: 0.2 10*3/uL (ref 0.0–0.5)
Eosinophils Relative: 2 %
HCT: 45.2 % (ref 39.0–52.0)
Hemoglobin: 14.7 g/dL (ref 13.0–17.0)
Immature Granulocytes: 0 %
Lymphocytes Relative: 16 %
Lymphs Abs: 1.4 10*3/uL (ref 0.7–4.0)
MCH: 33.3 pg (ref 26.0–34.0)
MCHC: 32.5 g/dL (ref 30.0–36.0)
MCV: 102.3 fL — ABNORMAL HIGH (ref 80.0–100.0)
Monocytes Absolute: 1 10*3/uL (ref 0.1–1.0)
Monocytes Relative: 11 %
Neutro Abs: 6.3 10*3/uL (ref 1.7–7.7)
Neutrophils Relative %: 71 %
Platelets: 93 10*3/uL — ABNORMAL LOW (ref 150–400)
RBC: 4.42 MIL/uL (ref 4.22–5.81)
RDW: 13.1 % (ref 11.5–15.5)
WBC: 9 10*3/uL (ref 4.0–10.5)
nRBC: 0 % (ref 0.0–0.2)

## 2022-10-13 LAB — COMPREHENSIVE METABOLIC PANEL
ALT: 22 U/L (ref 0–44)
AST: 40 U/L (ref 15–41)
Albumin: 2.7 g/dL — ABNORMAL LOW (ref 3.5–5.0)
Alkaline Phosphatase: 168 U/L — ABNORMAL HIGH (ref 38–126)
Anion gap: 9 (ref 5–15)
BUN: 21 mg/dL (ref 8–23)
CO2: 25 mmol/L (ref 22–32)
Calcium: 8.2 mg/dL — ABNORMAL LOW (ref 8.9–10.3)
Chloride: 102 mmol/L (ref 98–111)
Creatinine, Ser: 1.39 mg/dL — ABNORMAL HIGH (ref 0.61–1.24)
GFR, Estimated: 53 mL/min — ABNORMAL LOW (ref >60)
Glucose, Bld: 102 mg/dL — ABNORMAL HIGH (ref 70–99)
Potassium: 4.8 mmol/L (ref 3.5–5.1)
Sodium: 136 mmol/L (ref 135–145)
Total Bilirubin: 0.8 mg/dL (ref 0.3–1.2)
Total Protein: 7.1 g/dL (ref 6.5–8.1)

## 2022-10-13 LAB — GLUCOSE, CAPILLARY
Glucose-Capillary: 29 mg/dL — CL (ref 70–99)
Glucose-Capillary: 107 mg/dL — ABNORMAL HIGH (ref 70–99)
Glucose-Capillary: 83 mg/dL (ref 70–99)
Glucose-Capillary: 277 mg/dL — ABNORMAL HIGH (ref 70–99)
Glucose-Capillary: 362 mg/dL — ABNORMAL HIGH (ref 70–99)
Glucose-Capillary: 43 mg/dL — CL (ref 70–99)
Glucose-Capillary: 52 mg/dL — ABNORMAL LOW (ref 70–99)
Glucose-Capillary: 385 mg/dL — ABNORMAL HIGH (ref 70–99)

## 2022-10-13 LAB — MAGNESIUM: Magnesium: 2.6 mg/dL — ABNORMAL HIGH (ref 1.7–2.4)

## 2022-10-13 LAB — PHOSPHORUS: Phosphorus: 3.2 mg/dL (ref 2.5–4.6)

## 2022-10-13 MED ORDER — INSULIN NPH (HUMAN) (ISOPHANE) 100 UNIT/ML ~~LOC~~ SUSP
15.0000 [IU] | Freq: Every day | SUBCUTANEOUS | Status: DC
  Administered 2022-10-14: 15 [IU] via SUBCUTANEOUS

## 2022-10-13 MED ORDER — INSULIN NPH (HUMAN) (ISOPHANE) 100 UNIT/ML ~~LOC~~ SUSP
8.0000 [IU] | Freq: Every day | SUBCUTANEOUS | Status: DC
  Administered 2022-10-13: 8 [IU] via SUBCUTANEOUS
  Filled 2022-10-13: qty 10

## 2022-10-13 NOTE — Plan of Care (Signed)
  Problem: Coping: Goal: Ability to adjust to condition or change in health will improve Outcome: Progressing   Problem: Nutritional: Goal: Maintenance of adequate nutrition will improve Outcome: Progressing Goal: Progress toward achieving an optimal weight will improve Outcome: Progressing   Problem: Activity: Goal: Risk for activity intolerance will decrease Outcome: Progressing   Problem: Pain Managment: Goal: General experience of comfort will improve Outcome: Progressing   Problem: Safety: Goal: Ability to remain free from injury will improve Outcome: Progressing

## 2022-10-13 NOTE — Evaluation (Signed)
Physical Therapy One Time Evaluation Patient Details Name: [name] Wege Dines Jr. MRN: [id number] DOB: Jul 26, 1945 Today's Date: 10/13/2022  History of Present Illness  77 year old Caucasian male with past medical history significant for but not limited to multiple sclerosis, hypertension, diabetes mellitus type 1 since age of 78, CKD stage IIIa baseline creatinine 1.3, history of prior CVA, chronic pain syndrome on Nucynta and oxycodone, hyperlipidemia, amyloidosis as well as other comorbidities who presented to the ED with constipation and abdominal pain and admitted for fecal impaction.  Clinical Impression  Patient evaluated by Physical Therapy with no further acute PT needs identified. All education has been completed and the patient has no further questions.  Pt reports he just finished HHPT prior to this admission. Pt states he does not need PT at this time. Pt was agreeable to mobilize in room in preparation for returning home upon d/c.  Pt with gait abnormalities at baseline per his report due to history of neuropathy and MS.  Pt does not feel he needs any further physical therapy at this time and feels safe returning home upon d/c. PT is signing off. Thank you for this referral.         If plan is discharge home, recommend the following: A little help with walking and/or transfers;A little help with bathing/dressing/bathroom;Assist for transportation;Help with stairs or ramp for entrance;Assistance with cooking/housework   Can travel by private vehicle        Equipment Recommendations None recommended by PT  Recommendations for Other Services       Functional Status Assessment Patient has not had a recent decline in their functional status     Precautions / Restrictions Precautions Precautions: Fall Restrictions Weight Bearing Restrictions: No      Mobility  Bed Mobility Overal bed mobility: Modified Independent                  Transfers Overall transfer level: Needs  assistance Equipment used: Rolling walker (2 wheels) Transfers: Sit to/from Stand Sit to Stand: Contact guard assist           General transfer comment: provided CGA for safety, decreased control of descent to bed due to fatigue    Ambulation/Gait Ambulation/Gait assistance: Contact guard assist Gait Distance (Feet): 14 Feet Assistive device: Rolling walker (2 wheels) Gait Pattern/deviations: Step-through pattern, Decreased stride length, Decreased dorsiflexion - left, Decreased dorsiflexion - right, Trunk flexed       General Gait Details: pt reports poor gait pattern in general but worse since he does not have his walker or shoes present, provided CGA for safety, pt only ambulated short distance within room, has w/c at home and ambulates limited distances at baseline  Stairs            Wheelchair Mobility     Tilt Bed    Modified Rankin (Stroke Patients Only)       Balance Overall balance assessment: Mild deficits observed, not formally tested                                           Pertinent Vitals/Pain Pain Assessment Pain Assessment: 0-10 Pain Score: 5  Pain Location: extremities, chronic "never below a 5-6/10 Pain Descriptors / Indicators:  ("needles and nails") Pain Intervention(s): Repositioned, Monitored during session    Home Living Family/patient expects to be discharged to:: Private residence Living Arrangements: Spouse/significant other Available Help  at Discharge: Family;Available 24 hours/day Type of Home: House Home Access: Ramped entrance       Home Layout: One level Home Equipment: Art gallery manager;Wheelchair Customer service manager (4 wheels) Additional Comments: uses rollator and scooter, electric w/c too wide to fit through his home's doorways    Prior Function Prior Level of Function : Independent/Modified Independent                     Extremity/Trunk Assessment        Lower Extremity  Assessment Lower Extremity Assessment: RLE deficits/detail;LLE deficits/detail RLE Deficits / Details: R foot drop observed, severe bil neuropathy hx RLE Sensation: history of peripheral neuropathy LLE Sensation: history of peripheral neuropathy    Cervical / Trunk Assessment Cervical / Trunk Assessment: Other exceptions Cervical / Trunk Exceptions: severe forward head posture  Communication   Communication Communication: No apparent difficulties  Cognition Arousal: Alert Behavior During Therapy: WFL for tasks assessed/performed Overall Cognitive Status: Within Functional Limits for tasks assessed                                          General Comments      Exercises     Assessment/Plan    PT Assessment Patient does not need any further PT services  PT Problem List         PT Treatment Interventions      PT Goals (Current goals can be found in the Care Plan section)  Acute Rehab PT Goals PT Goal Formulation: All assessment and education complete, DC therapy    Frequency       Co-evaluation               AM-PAC PT "6 Clicks" Mobility  Outcome Measure Help needed turning from your back to your side while in a flat bed without using bedrails?: A Little Help needed moving from lying on your back to sitting on the side of a flat bed without using bedrails?: A Little Help needed moving to and from a bed to a chair (including a wheelchair)?: A Little Help needed standing up from a chair using your arms (e.g., wheelchair or bedside chair)?: A Little Help needed to walk in hospital room?: A Little Help needed climbing 3-5 steps with a railing? : A Lot 6 Click Score: 17    End of Session   Activity Tolerance: Patient tolerated treatment well Patient left: in bed;with call bell/phone within reach;with bed alarm set Nurse Communication: Mobility status PT Visit Diagnosis: Difficulty in walking, not elsewhere classified (R26.2)    Time:  1610-9604 PT Time Calculation (min) (ACUTE ONLY): 18 min   Charges:   PT Evaluation $PT Eval Low Complexity: 1 Low   PT General Charges $$ ACUTE PT VISIT: 1 Visit        Thomasene Mohair PT, DPT Physical Therapist Acute Rehabilitation Services Office: [phone]   Janan Halter Payson 10/13/2022, 12:54 PM

## 2022-10-13 NOTE — Progress Notes (Signed)
Subjective: No complaints.  Feeling well.  Objective: Vital signs in last 24 hours: Temp:  [98.3 F (36.8 C)-98.5 F (36.9 C)] 98.5 F (36.9 C) (09/05 1340) Pulse Rate:  [63-71] 63 (09/05 1340) Resp:  [18] 18 (09/05 1340) BP: (107-137)/(51-97) 107/53 (09/05 1340) SpO2:  [93 %-96 %] 93 % (09/05 1340) Weight:  [93.5 kg] 93.5 kg (09/05 0500) Last BM Date : 10/12/22  Intake/Output from previous day: 09/04 0701 - 09/05 0700 In: -  Out: 1500 [Urine:1500] Intake/Output this shift: Total I/O In: 240 [P.O.:240] Out: 1100 [Urine:1100]  General appearance: alert and no distress GI: soft, non-tender; bowel sounds normal; no masses,  no organomegaly  Lab Results: Recent Labs    10/12/22 0332 10/13/22 0542  WBC 9.7 9.0  HGB 14.9 14.7  HCT 45.1 45.2  PLT 94* 93*   BMET Recent Labs    10/12/22 0332 10/13/22 0402  NA 137 136  K 4.0 4.8  CL 100 102  CO2 27 25  GLUCOSE 123* 102*  BUN 24* 21  CREATININE 1.43* 1.39*  CALCIUM 8.5* 8.2*   LFT Recent Labs    10/13/22 0402  PROT 7.1  ALBUMIN 2.7*  AST 40  ALT 22  ALKPHOS 168*  BILITOT 0.8   PT/INR No results for input(s): "LABPROT", "INR" in the last 72 hours. Hepatitis Panel No results for input(s): "HEPBSAG", "HCVAB", "HEPAIGM", "HEPBIGM" in the last 72 hours. C-Diff No results for input(s): "CDIFFTOX" in the last 72 hours. Fecal Lactopherrin No results for input(s): "FECLLACTOFRN" in the last 72 hours.  Studies/Results: DG Abd 1 View  Result Date: 10/13/2022 CLINICAL DATA:  Constipation. EXAM: ABDOMEN - 1 VIEW COMPARISON:  October 10, 2022. FINDINGS: No abnormal bowel dilatation. No significant stool burden is noted. No radio-opaque calculi or other significant radiographic abnormality are seen. IMPRESSION: No abnormal bowel dilatation. Electronically Signed   By: Lupita Raider M.D.   On: 10/13/2022 09:47    Medications: Scheduled:  amLODipine  5 mg Oral q morning   aspirin  81 mg Oral Daily   atorvastatin   10 mg Oral q morning   bisacodyl  10 mg Oral Daily   clopidogrel  75 mg Oral Daily   DULoxetine  60 mg Oral BID   finasteride  5 mg Oral q morning   Gerhardt's butt cream   Topical TID   insulin aspart  0-5 Units Subcutaneous QHS   insulin aspart  0-9 Units Subcutaneous TID WC   [START ON 10/14/2022] insulin NPH Human  15 Units Subcutaneous QAC breakfast   insulin NPH Human  8 Units Subcutaneous QHS   metoprolol tartrate  25 mg Oral BID   metroNIDAZOLE  500 mg Oral Q12H   polyethylene glycol  34 g Oral Daily   tapentadol  200 mg Oral Q12H   traZODone  150 mg Oral QHS   Continuous:  sodium chloride      Assessment/Plan: 1) Fecal impaction. 2) Abdominal pain.   Both of his issue resolved.  He feels that he adequately passed solid stool.  Plan: 1) Daily Miralax. 2) If possible, he needs to be on Movantik for his opioid-induced constipation. 3) Signing off.  LOS: 2 days   Charlita Brian D 10/13/2022, 5:06 PM

## 2022-10-13 NOTE — Plan of Care (Signed)

## 2022-10-13 NOTE — Progress Notes (Signed)
OT Cancellation Note  Patient Details Name: [name] Mathes Cotto Jr. MRN: [id number] DOB: Apr 12, 1945   Cancelled Treatment:    Reason Eval/Treat Not Completed: OT screened, no needs identified, will sign off. OT spoke with the pt who stated he feels as though he is at his baseline level of functioning for [facility]-care management & he is not presenting with acute functional deficits that would warrant the need for further OT services. He does not feel as though he needs an OT evaluation. OT will sign off. Thank you.     Reuben Likes, OTR/L 10/13/2022, 3:18 PM

## 2022-10-13 NOTE — Progress Notes (Signed)
PROGRESS NOTE    [name] M Mcguirt Jr.  ZOX:[id number] DOB: 1945-04-03 DOA: 10/10/2022 PCP: Geoffry Paradise, MD   Brief Narrative:  The patient is a 77 year old Caucasian male with past medical history significant for but not limited to multiple sclerosis, hypertension, diabetes mellitus type 1 since age of 52, CKD stage IIIa baseline creatinine 1.3, history of prior CVA, chronic pain syndrome on Nucynta and oxycodone, hyperlipidemia, amyloidosis as well as other comorbidities who presented to the ED with constipation and abdominal pain.  He has been on multiple narcotic medication had chronic constipation and usually takes MiraLAX almost on a daily basis.  Last bowel movement prior to admission was 2 to 3 days prior and he reported increasing abdominal discomfort despite taking regular MiraLAX and stool softeners as well as other laxatives.  In the ED CT his abdomen pelvis was done and demonstrated 7.6 cm stool ball in the rectum with mild perirectal edema posteriorly as well consistent with fecal impaction.  He was admitted for further evaluation and GI was consulted.  Patient refused digital disimpaction so was given multiple laxatives and enemas which have now improved his symptoms.  KUB showed no significant stool burden. Anticipating D/C in the next 24 hours given that we want to see how he tolerates his Soft Diet for at least 24 hours.   Assessment and Plan:  Fecal Impaction, improving  -Patient presented with abdominal pain and constipation . -Continue with PO miralax, SMOG enemas, dulcolax.  -Received Naloxegol Oxalate 12.5 mg as pt is on chronic long term opiates.  -Continue Metronidazole 500 mg po 12h for stercoral colitis.  -Continue CLD and started SOFT now and will see he tolerates today and if stable can D/C in the AM  -Pt states he has been having bowel movements this week.  -He's probably leaking around this stool ball in his rectum. -Consider repeat CT abd/pelvis without contrast to  check resolution of fecal impaction however KUB showing no signfiicant stool burden now.  -Check KUB in the AM and it showed "No abnormal bowel dilatation. No significant stool burden is noted. 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  -Podiatry did not think his ulcer was infected.  -Pt's foot appears improved compared to picture taken in podiatry office on 10-05-2022. -C/w Blood Sugar Control  -WOC nurse evaluated and recommending "Clean bilateral 2nd digit ulcers with NS, paint with Betadine twice daily. May leave open to air or wrap with dry gauze and Kerlix roll gauze, whichever is preferred.    Patient also noted to have dusky appearance to sacrum/coccyx and buttocks.  This appears to be chronic tissue damage to this area.    Will order Gerhardt's Butt Cream 3 times a day and prn soiling."   Chronic Pain Syndrome: -On chronic Tapentadol 200 mg po q12h and Oxycodone 15 mg po q6hprn Moderate Pain -His chronic opiate therapy is likely the cause of his chronic constipation. -Continue Naloxegol Oxalate 12.5 mg po Daily    Primary Amyloidosis (HCC) -Stable. Not on any specific therapy.   Multiple Sclerosis (HCC) -Chronic. Pt not on any specific therapy for his MS.   Abnormality of Gait -Pt able to stand and transfer. Walks with assistance a short distance to kitchen(no more than 20 feet). -Obtain PT/OT Evaluation and they  have signed off as patient feels he is close to his baseline.   Essential Hypertension -Continue Amlodipine 5 mg po Daily, Finasteride 5 mg po qMorning, and Metoprolol Tartrate 25 mg po BID -Continue to Monitor BP per Protocol -Last BP reading was a little soft at 107/53   Pure Hypercholesterolemia -Continue with Atorvastatin 10 mg  daily.   Type 1 Diabetes Mellitus with complications (HCC) -Continue with insulin NPH 30 units subcu before breakfast and 15 units subcu before bedtime as well as sensitive NovoLog sliding scale insulin before meals and at bedtime -Continue monitor CBGs and blood sugars per protocol -CBG trend: Recent Labs  Lab 10/12/22 1608 10/12/22 2103 10/13/22 0310 10/13/22 0332 10/13/22 0347 10/13/22 0710 10/13/22 1141  GLUCAP 93 200* 29* 83 107* 277* 362*  -Glucose Trend: Recent Labs  Lab 10/10/22 1645 10/12/22 0332 10/13/22 0402  GLUCOSE 157* 123* 102*   CKD Stage 3a -Has a baseline Serum Creatine of 1.3; BUN/Cr Trend: Recent Labs  Lab 10/10/22 1645 10/12/22 0332 10/13/22 0402  BUN 33* 24* 21  CREATININE 1.50* 1.43* 1.39*  -Avoid Nephrotoxic Medications, Contrast Dyes, Hypotension and Dehydration to Ensure Adequate Renal Perfusion and will need to Renally Adjust Meds -Continue to Monitor and Trend Renal Function carefully and repeat CMP in the AM   Hx of CVA with Residual Left Sided Weakness -C/w ASA and Statin  CAD -C/w ASA 81 mg po Daily, Clopidogrel 75 mg po Daily, and Atrovastatin 10 mg po Daily  -No CP currently   Thrombocytopenia -Platelet Count Trend: Recent Labs  Lab 10/10/22 1645 10/12/22 0332 10/13/22 0542  PLT 104* 94* 93*  -Continue to Monitor for S/Sx of Bleeding; No overt bleeding noted -On SCDs for VTE Prophylaxis -Repeat CBC in the AM   Hypoalbuminemia -[name] Albumin Trend: Recent Labs  Lab 10/10/22 1645 10/13/22 0402  ALBUMIN 3.2* 2.7*  -Continue to Monitor and Trend and repeat CMP in the AM  Overweight -Complicates overall prognosis and care -Estimated body mass index is 27.96 kg/m as calculated from the following:   Height as of this encounter: 6' (1.829 m).   Weight as of this encounter: 93.5 kg.  -Weight Loss and Dietary Counseling given   DVT prophylaxis: SCDs Start: 10/10/22 2327    Code Status: Full Code Family  Communication: No family present at bedside   Disposition Plan:  Level of care: Med-Surg Status is: Inpatient Remains inpatient appropriate because: Fecal Impaction is clearing and want to evaluate how he does with 24 hours of a Soft Diet. Anticipating D/C home in the next 24 hours.    Consultants:  Gastroenterology  Procedures:  As delineated as above   Antimicrobials:  Anti-infectives (From admission, onward)    Start     Dose/Rate Route Frequency Ordered Stop   10/10/22 2330  metroNIDAZOLE (FLAGYL) tablet 500 mg        500 mg Oral Every 12 hours 10/10/22 2326         Subjective: Examined at bedside and states he had a very very large bowel movement earlier and had to be cleaned up.  He denies any nausea or vomiting.  Had just started eating a soft diet.  States his abdomen is much softer now and he is passing flatus.  No other concerns or close this time anticipate discharging home in the next 24 hours.  Objective: Vitals:   10/12/22 2101 10/13/22 0500 10/13/22 0545 10/13/22 1340  BP: (!) 123/97  (!) 137/51 (!) 107/53  Pulse: 66  71  63  Resp: 18  18 18   Temp: 98.4 F (36.9 C)  98.3 F (36.8 C) 98.5 F (36.9 C)  TempSrc: Oral  Oral   SpO2: 96%  95% 93%  Weight:  93.5 kg    Height:        Intake/Output Summary (Last 24 hours) at 10/13/2022 1531 Last data filed at 10/13/2022 1340 Gross per 24 hour  Intake 240 ml  Output 2200 ml  Net -1960 ml   Filed Weights   10/11/22 0448 10/12/22 0500 10/13/22 0500  Weight: 91.3 kg 94.3 kg 93.5 kg   Examination: Physical Exam:  Constitutional: WN/WD overweight chronically ill-appearing Caucasian elderly male in no acute distress Respiratory: Diminished to auscultation bilaterally, no wheezing, rales, rhonchi or crackles. Normal respiratory effort and patient is not tachypenic. No accessory muscle use.  Unlabored breathing Cardiovascular: RRR, no murmurs / rubs / gallops. S1 and S2 auscultated. No extremity edema.  Abdomen:  Soft, non-tender, distended secondary to body habitus.  Bowel sounds positive.  GU: Deferred. Musculoskeletal: No clubbing / cyanosis of digits/nails. No joint deformity upper and lower extremities.  Skin: No rashes, lesions, ulcers on limited skin evaluation. No induration; Warm and dry.  Neurologic: CN 2-12 grossly intact with no focal deficits. Romberg sign and cerebellar reflexes not assessed.  Psychiatric: Normal judgment and insight. Alert and oriented x 3. Normal mood and appropriate affect.   Data Reviewed: I have personally reviewed following labs and imaging studies  CBC: Recent Labs  Lab 10/10/22 1645 10/12/22 0332 10/13/22 0542  WBC 13.7* 9.7 9.0  NEUTROABS 11.4*  --  6.3  HGB 15.8 14.9 14.7  HCT 46.7 45.1 45.2  MCV 99.6 103.2* 102.3*  PLT 104* 94* 93*   Basic Metabolic Panel: Recent Labs  Lab 10/10/22 1645 10/12/22 0332 10/13/22 0402  NA 136 137 136  K 4.3 4.0 4.8  CL 101 100 102  CO2 24 27 25   GLUCOSE 157* 123* 102*  BUN 33* 24* 21  CREATININE 1.50* 1.43* 1.39*  CALCIUM 8.8* 8.5* 8.2*  MG  --  2.6* 2.6*  PHOS  --  3.2 3.2   GFR: Estimated Creatinine Clearance: 54.6 mL/min (A) (by C-G formula based on SCr of 1.39 mg/dL (H)). Liver Function Tests: Recent Labs  Lab 10/10/22 1645 10/13/22 0402  AST 37 40  ALT 26 22  ALKPHOS 239* 168*  BILITOT 1.0 0.8  PROT 8.3* 7.1  ALBUMIN 3.2* 2.7*   Recent Labs  Lab 10/10/22 1645  LIPASE 20   No results for input(s): "AMMONIA" in the last 168 hours. Coagulation Profile: No results for input(s): "INR", "PROTIME" in the last 168 hours. Cardiac Enzymes: No results for input(s): "CKTOTAL", "CKMB", "CKMBINDEX", "TROPONINI" in the last 168 hours. BNP (last 3 results) No results for input(s): "PROBNP" in the last 8760 hours. HbA1C: Recent Labs    10/11/22 0322  HGBA1C 5.8*   CBG: Recent Labs  Lab 10/13/22 0310 10/13/22 0332 10/13/22 0347 10/13/22 0710 10/13/22 1141  GLUCAP 29* 83 107* 277* 362*    Lipid Profile: No results for input(s): "CHOL", "HDL", "LDLCALC", "TRIG", "CHOLHDL", "LDLDIRECT" in the last 72 hours. Thyroid Function Tests: No results for input(s): "TSH", "T4TOTAL", "FREET4", "T3FREE", "THYROIDAB" in the last 72 hours. Anemia Panel: No results for input(s): "VITAMINB12", "FOLATE", "FERRITIN", "TIBC", "IRON", "RETICCTPCT" in the last 72 hours. Sepsis Labs: No results for input(s): "PROCALCITON", "LATICACIDVEN" in the last 168 hours.  No results found for this or any previous visit (from the past 240 hour(s)).  Radiology Studies: DG Abd 1 View  Result Date: 10/13/2022 CLINICAL DATA:  Constipation. EXAM: ABDOMEN - 1 VIEW COMPARISON:  October 10, 2022. FINDINGS: No abnormal bowel dilatation. No significant stool burden is noted. No radio-opaque calculi or other significant radiographic abnormality are seen. IMPRESSION: No abnormal bowel dilatation. Electronically Signed   By: Lupita Raider M.D.   On: 10/13/2022 09:47    Scheduled Meds:  amLODipine  5 mg Oral q morning   aspirin  81 mg Oral Daily   atorvastatin  10 mg Oral q morning   bisacodyl  10 mg Oral Daily   clopidogrel  75 mg Oral Daily   DULoxetine  60 mg Oral BID   finasteride  5 mg Oral q morning   Gerhardt's butt cream   Topical TID   insulin aspart  0-5 Units Subcutaneous QHS   insulin aspart  0-9 Units Subcutaneous TID WC   [START ON 10/14/2022] insulin NPH Human  15 Units Subcutaneous QAC breakfast   insulin NPH Human  8 Units Subcutaneous QHS   metoprolol tartrate  25 mg Oral BID   metroNIDAZOLE  500 mg Oral Q12H   polyethylene glycol  34 g Oral Daily   tapentadol  200 mg Oral Q12H   traZODone  150 mg Oral QHS   Continuous Infusions:  sodium chloride      LOS: 2 days   Marguerita Merles, DO [facility] Available via Epic secure chat 7am-7pm After these hours, please refer to coverage provider listed on [email or web address] 10/13/2022, 3:31 PM

## 2022-10-13 NOTE — Inpatient Diabetes Management (Signed)
Inpatient Diabetes Program Recommendations  AACE/ADA: New Consensus Statement on Inpatient Glycemic Control (2015)  Target Ranges:  Prepandial:   less than 140 mg/dL      Peak postprandial:   less than 180 mg/dL (1-2 hours)      Critically ill patients:  140 - 180 mg/dL   Lab Results  Component Value Date   GLUCAP 277 (H) 10/13/2022   HGBA1C 5.8 (H) 10/11/2022    Review of Glycemic Control  Latest Reference Range & Units 10/13/22 03:10 10/13/22 03:32 10/13/22 03:47 10/13/22 07:10  Glucose-Capillary 70 - 99 mg/dL 29 (LL) 83 409 (H) 811 (H)  (LL): Data is critically low (H): Data is abnormally high  Diabetes history: DM Outpatient Diabetes medications: NPH 30 units QAM, 15 units QHS Current orders for Inpatient glycemic control: NPH 30 units QAM, 15 units at bedtime, Novolog 0-9 units TID and 0-5 units QHS  Inpatient Diabetes Program Recommendations:    Hypoglycemia this morning-29 mg/dL.  Please consider:  NPH 15 QAM, 8 units at bedtime (50% of home dose)  Will continue to follow while inpatient.  Thank you, Dulce Sellar, MSN, CDCES Diabetes Coordinator Inpatient Diabetes Program [phone] (team pager from 8a-5p)

## 2022-10-14 ENCOUNTER — Other Ambulatory Visit (HOSPITAL_BASED_OUTPATIENT_CLINIC_OR_DEPARTMENT_OTHER): Payer: Self-pay

## 2022-10-14 DIAGNOSIS — G894 Chronic pain syndrome: Secondary | ICD-10-CM | POA: Diagnosis not present

## 2022-10-14 DIAGNOSIS — N1831 Chronic kidney disease, stage 3a: Secondary | ICD-10-CM | POA: Diagnosis not present

## 2022-10-14 DIAGNOSIS — R269 Unspecified abnormalities of gait and mobility: Secondary | ICD-10-CM | POA: Diagnosis not present

## 2022-10-14 DIAGNOSIS — K5641 Fecal impaction: Secondary | ICD-10-CM | POA: Diagnosis not present

## 2022-10-14 LAB — CBC WITH DIFFERENTIAL/PLATELET
Abs Immature Granulocytes: 0.02 10*3/uL (ref 0.00–0.07)
Basophils Absolute: 0 10*3/uL (ref 0.0–0.1)
Basophils Relative: 1 %
Eosinophils Absolute: 0.2 10*3/uL (ref 0.0–0.5)
Eosinophils Relative: 3 %
HCT: 41.4 % (ref 39.0–52.0)
Hemoglobin: 13.7 g/dL (ref 13.0–17.0)
Immature Granulocytes: 0 %
Lymphocytes Relative: 26 %
Lymphs Abs: 1.9 10*3/uL (ref 0.7–4.0)
MCH: 34.3 pg — ABNORMAL HIGH (ref 26.0–34.0)
MCHC: 33.1 g/dL (ref 30.0–36.0)
MCV: 103.8 fL — ABNORMAL HIGH (ref 80.0–100.0)
Monocytes Absolute: 0.8 10*3/uL (ref 0.1–1.0)
Monocytes Relative: 11 %
Neutro Abs: 4.2 10*3/uL (ref 1.7–7.7)
Neutrophils Relative %: 59 %
Platelets: 86 10*3/uL — ABNORMAL LOW (ref 150–400)
RBC: 3.99 MIL/uL — ABNORMAL LOW (ref 4.22–5.81)
RDW: 12.9 % (ref 11.5–15.5)
WBC: 7.2 10*3/uL (ref 4.0–10.5)
nRBC: 0 % (ref 0.0–0.2)

## 2022-10-14 LAB — COMPREHENSIVE METABOLIC PANEL
ALT: 32 U/L (ref 0–44)
AST: 48 U/L — ABNORMAL HIGH (ref 15–41)
Albumin: 2.4 g/dL — ABNORMAL LOW (ref 3.5–5.0)
Alkaline Phosphatase: 161 U/L — ABNORMAL HIGH (ref 38–126)
Anion gap: 8 (ref 5–15)
BUN: 23 mg/dL (ref 8–23)
CO2: 24 mmol/L (ref 22–32)
Calcium: 8 mg/dL — ABNORMAL LOW (ref 8.9–10.3)
Chloride: 101 mmol/L (ref 98–111)
Creatinine, Ser: 1.35 mg/dL — ABNORMAL HIGH (ref 0.61–1.24)
GFR, Estimated: 55 mL/min — ABNORMAL LOW (ref >60)
Glucose, Bld: 248 mg/dL — ABNORMAL HIGH (ref 70–99)
Potassium: 4.8 mmol/L (ref 3.5–5.1)
Sodium: 133 mmol/L — ABNORMAL LOW (ref 135–145)
Total Bilirubin: 0.6 mg/dL (ref 0.3–1.2)
Total Protein: 6.3 g/dL — ABNORMAL LOW (ref 6.5–8.1)

## 2022-10-14 LAB — MAGNESIUM: Magnesium: 2.4 mg/dL (ref 1.7–2.4)

## 2022-10-14 LAB — GLUCOSE, CAPILLARY
Glucose-Capillary: 248 mg/dL — ABNORMAL HIGH (ref 70–99)
Glucose-Capillary: 320 mg/dL — ABNORMAL HIGH (ref 70–99)

## 2022-10-14 LAB — PHOSPHORUS: Phosphorus: 2.9 mg/dL (ref 2.5–4.6)

## 2022-10-14 MED ORDER — SENNOSIDES-DOCUSATE SODIUM 8.6-50 MG PO TABS
1.0000 | ORAL_TABLET | Freq: Two times a day (BID) | ORAL | 0 refills | Status: DC
  Filled 2022-10-14: qty 60, 30d supply, fill #0

## 2022-10-14 MED ORDER — ONDANSETRON HCL 4 MG PO TABS
4.0000 mg | ORAL_TABLET | Freq: Four times a day (QID) | ORAL | 0 refills | Status: DC | PRN
  Filled 2022-10-14: qty 20, 5d supply, fill #0

## 2022-10-14 MED ORDER — PEG 3350 17 GM/SCOOP PO POWD
17.0000 g | Freq: Two times a day (BID) | ORAL | 0 refills | Status: DC
  Filled 2022-10-14: qty 238, 7d supply, fill #0

## 2022-10-14 MED ORDER — BISACODYL 5 MG PO TBEC
10.0000 mg | DELAYED_RELEASE_TABLET | Freq: Every day | ORAL | 0 refills | Status: DC | PRN
  Filled 2022-10-14: qty 25, 12d supply, fill #0

## 2022-10-14 MED ORDER — ZINC OXIDE 20 % EX OINT
TOPICAL_OINTMENT | Freq: Three times a day (TID) | CUTANEOUS | 0 refills | Status: DC
  Filled 2022-10-14 (×2): qty 90, 30d supply, fill #0
  Filled 2022-10-14: qty 1, 1d supply, fill #0

## 2022-10-14 MED ORDER — ACETAMINOPHEN 325 MG PO TABS
650.0000 mg | ORAL_TABLET | Freq: Four times a day (QID) | ORAL | 0 refills | Status: DC | PRN
  Filled 2022-10-14: qty 100, 13d supply, fill #0

## 2022-10-14 NOTE — Discharge Summary (Signed)
Physician Discharge Summary   Patient: [name] Freidel Keathley Jr. MRN: [id number] DOB: 26-May-1945  Admit date:     10/10/2022  Discharge date: 10/14/22  Discharge Physician: Marguerita Merles, DO   PCP: Geoffry Paradise, MD   Recommendations at discharge:   Follow-up with PCP within 1 to 2 weeks and repeat CBC, CMP, mag, Phos within 1 week Have PCP try and get the patient Movantik for discharge Follow-up with gastroenterology in outpatient setting within 1 to 2 weeks Follow-up with neurology in outpatient setting Follow-up with podiatry in outpatient setting for diabetic foot ulcer  Discharge Diagnoses: Principal Problem:   Fecal impaction (HCC) Active Problems:   Type 1 diabetes mellitus with complications (HCC)   Pure hypercholesterolemia   Essential hypertension   Abnormality of gait   Multiple sclerosis (HCC)   Primary amyloidosis (HCC)   Chronic pain syndrome   Chronic kidney disease, stage 3a (HCC)   Diabetic ulcer of toe (HCC)   Thrombocytopenia (HCC)  Resolved Problems:   * No resolved hospital problems. [facility] Course: The patient is a 77 year old Caucasian male with past medical history significant for but not limited to multiple sclerosis, hypertension, diabetes mellitus type 1 since age of 62, CKD stage IIIa baseline creatinine 1.3, history of prior CVA, chronic pain syndrome on Nucynta and oxycodone, hyperlipidemia, amyloidosis as well as other comorbidities who presented to the ED with constipation and abdominal pain.  He has been on multiple narcotic medication had chronic constipation and usually takes MiraLAX almost on a daily basis.  Last bowel movement prior to admission was 2 to 3 days prior and he reported increasing abdominal discomfort despite taking regular MiraLAX and stool softeners as well as other laxatives.  In the ED CT his abdomen pelvis was done and demonstrated 7.6 cm stool ball in the rectum with mild perirectal edema posteriorly as well consistent with fecal  impaction.  He was admitted for further evaluation and GI was consulted.  Patient refused digital disimpaction so was given multiple laxatives and enemas which have now improved his symptoms.  KUB showed no significant stool burden.  Given that the patient is improved and tolerated soft diet will discharge home and will follow with PCP, neurology, podiatry and gastroenterology outpatient setting.  Assessment and Plan:  Fecal Impaction, improving  -Patient presented with abdominal pain and constipation . -Continue with PO miralax, SMOG enemas, dulcolax.  -Received Naloxegol Oxalate 12.5 mg as pt is on chronic long term opiates.  -Continue Metronidazole 500 mg po 12h for stercoral colitis.  -Continue CLD and started SOFT now and will see he tolerates today and if stable can D/C in the AM  -Pt states he has been having bowel movements this week.  -He's probably leaking around this stool ball in his rectum. -Consider repeat CT abd/pelvis without contrast to check resolution of fecal impaction however KUB showing no signfiicant stool burden now.  -Check KUB in the AM and it showed "No abnormal bowel dilatation. No significant stool burden is noted. No radio-opaque calculi or other significant radiographic abnormality are seen." -GI consulted for further evaluation and Recommendations and appreciate their evaluation.  I discussed with Dr. 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  -Podiatry did not think his ulcer was infected.  -Pt's foot appears improved compared to picture taken in podiatry office on 10-05-2022. -C/w Blood Sugar Control  -WOC nurse  evaluated and recommending "Clean bilateral 2nd digit ulcers with NS, paint with Betadine twice daily. May leave open to air or wrap with dry gauze and Kerlix roll gauze, whichever is preferred.    Patient also noted to have dusky appearance to sacrum/coccyx and buttocks.  This appears to be chronic tissue damage to this area.    Will order Gerhardt's Butt Cream 3 times a day and prn soiling." -Follow-up with PCP and podiatry in outpatient setting   Chronic Pain Syndrome: -On chronic Tapentadol 200 mg po q12h and Oxycodone 15 mg po q6hprn Moderate Pain -His chronic opiate therapy is likely the cause of his chronic constipation. -Continue Naloxegol Oxalate 12.5 mg po Daily    Primary Amyloidosis (HCC) -Stable. Not on any specific therapy.   Multiple Sclerosis (HCC) -Chronic. Pt not on any specific therapy for his MS.   Abnormality of Gait -Pt able to stand and transfer. Walks with assistance a short distance to kitchen(no more than 20 feet). -Obtain PT/OT Evaluation and they have signed off as patient feels he is close to his baseline.   Essential Hypertension -Continue Amlodipine 5 mg po Daily, Finasteride 5 mg po qMorning, and Metoprolol Tartrate 25 mg po BID -Continue to Monitor BP per Protocol -Last BP reading was a little soft at 107/53   Pure Hypercholesterolemia -Continue with Atorvastatin 10 mg daily.   Type 1 Diabetes Mellitus with complications (HCC) -Continue with insulin NPH 30 units subcu before breakfast and 15 units subcu before bedtime as well as sensitive NovoLog sliding scale insulin before meals and at bedtime -Continue monitor CBGs and blood sugars per protocol -CBG trend: Recent Labs  Lab 10/13/22 0710 10/13/22 1141 10/13/22 1646 10/13/22 1716 10/13/22 2205 10/14/22 0723 10/14/22 1132  GLUCAP 277* 362* 43* 52* 385* 248* 320*  -Glucose Trend: Recent Labs  Lab 10/10/22 1645 10/12/22 0332 10/13/22 0402 10/14/22 0314  GLUCOSE 157* 123* 102* 248*    CKD Stage 3a -Has a baseline Serum Creatine of 1.3; BUN/Cr Trend: Recent Labs  Lab 10/10/22 1645 10/12/22 0332 10/13/22 0402 10/14/22 0314  BUN 33* 24* 21 23  CREATININE 1.50* 1.43* 1.39* 1.35*  -Avoid Nephrotoxic Medications, Contrast Dyes, Hypotension and Dehydration to Ensure Adequate Renal Perfusion and will need to Renally Adjust Meds -Continue to Monitor and Trend Renal Function carefully and repeat CMP in the AM   Hx of CVA with Residual Left Sided Weakness -C/w ASA and Statin  CAD -C/w ASA 81 mg po Daily, Clopidogrel 75 mg po Daily, and Atrovastatin 10 mg po Daily  -No CP currently   Thrombocytopenia -Platelet Count Trend: Recent Labs  Lab 10/10/22 1645 10/12/22 0332 10/13/22 0542 10/14/22 0314  PLT 104* 94* 93* 86*  -Continue to Monitor for S/Sx of Bleeding; No overt bleeding noted -On SCDs for VTE Prophylaxis -Repeat CBC in the AM   Hypoalbuminemia -[name] Albumin Trend: Recent Labs  Lab 10/10/22 1645 10/13/22 0402 10/14/22 0314  ALBUMIN 3.2* 2.7* 2.4*  -Continue to Monitor and Trend and repeat CMP in the AM  Overweight -Complicates overall prognosis and care -Estimated body mass index is 27.78 kg/m as calculated from the following:   Height as of this encounter: 6' (1.829 m).   Weight as of this encounter: 92.9 kg.  -Weight Loss  and Dietary Counseling given  Active Pressure Injury/Wound(s)     Pressure Ulcer  Duration          Pressure Ulcer 04/21/13 Stage II -  Partial thickness loss of dermis presenting as a shallow open ulcer with a red, pink wound bed without slough. 3464 days           Consultants: Gastroenterology Procedures performed: As delineated as above Disposition: Home Diet recommendation:  Discharge Diet Orders (From admission, onward)     Start     Ordered   10/14/22 0000  Diet - low sodium heart healthy        10/14/22 1304   10/14/22 0000  Diet Carb Modified        10/14/22 1304           Cardiac and  Carb modified diet DISCHARGE MEDICATION: Allergies as of 10/14/2022   No Known Allergies      Medication List     STOP taking these medications    doxycycline 100 MG tablet Commonly known as: VIBRA-TABS       TAKE these medications    acetaminophen 325 MG tablet Commonly known as: TYLENOL Take 2 tablets (650 mg total) by mouth every 6 (six) hours as needed for mild pain (or Fever >/= 101).   amLODipine 5 MG tablet Commonly known as: NORVASC Take 5 mg by mouth every morning.   aspirin 81 MG tablet Take 81 mg by mouth daily.   atorvastatin 10 MG tablet Commonly known as: LIPITOR Take 10 mg by mouth every morning.   bisacodyl 5 MG EC tablet Generic drug: bisacodyl Take 2 tablets (10 mg total) by mouth daily as needed for moderate constipation.   clopidogrel 75 MG tablet Commonly known as: PLAVIX Take 75 mg by mouth daily.   DULoxetine 60 MG capsule Commonly known as: CYMBALTA Take 60 mg by mouth 2 (two) times daily.   finasteride 5 MG tablet Commonly known as: PROSCAR Take 5 mg by mouth every morning.   HumuLIN N KwikPen 100 UNIT/ML Kiwkpen Generic drug: Insulin NPH (Human) (Isophane) Inject 10-30 Units into the skin 2 (two) times daily. Inject 30 units in the morning and Inject 10-15 units in the evening.   metoprolol tartrate 25 MG tablet Commonly known as: LOPRESSOR Take 25 mg by mouth 2 (two) times daily.   nitroGLYCERIN 0.4 mg/hr patch Commonly known as: Nitro-Dur Apply patch to the white flesh of the toe daily.   Nucynta ER 200 MG Tb12 Generic drug: Tapentadol HCl Take 1 tablet (200 mg total) by mouth every 12 (twelve) hours.   nystatin cream-hydrocortisone cream-zinc oxide Apply topically 3 (three) times daily.   ondansetron 4 MG tablet Commonly known as: ZOFRAN Take 1 tablet (4 mg total) by mouth every 6 (six) hours as needed for nausea.   oxyCODONE 15 MG immediate release tablet Commonly known as: ROXICODONE Take 1 tablet (15 mg total)  by mouth every 6 (six) hours as needed for pain.   polyethylene glycol powder 17 GM/SCOOP powder Commonly known as: GLYCOLAX/MIRALAX Take 17 g by mouth 2 (two) times daily.   Senna-Plus 8.6-50 MG tablet Generic drug: senna-docusate Take 1 tablet by mouth 2 (two) times daily.   traZODone 150 MG tablet Commonly known as: DESYREL Take 150 mg by mouth at bedtime.               Discharge Care Instructions  (From admission, onward)           Start  Ordered   10/14/22 0000  Discharge wound care:       Comments: Clean bilateral 2nd digit ulcers with NS, paint with Betadine twice daily. May leave open to air or wrap with dry gauze and Kerlix roll gauze, whichever is preferred   10/14/22 1304            Discharge Exam: Filed Weights   10/12/22 0500 10/13/22 0500 10/14/22 0500  Weight: 94.3 kg 93.5 kg 92.9 kg   Vitals:   10/14/22 0628 10/14/22 1327  BP: (!) 139/92 (!) 140/86  Pulse: 69 69  Resp: 18 18  Temp: 98.3 F (36.8 C) 98.6 F (37 C)  SpO2: 92% 94%   Examination: Physical Exam:  Constitutional: WN/WD overweight chronically ill-appearing Caucasian male in no acute distress Respiratory: Diminished to auscultation bilaterally, no wheezing, rales, rhonchi or crackles. Normal respiratory effort and patient is not tachypenic. No accessory muscle use.  Unlabored breathing Cardiovascular: RRR, no murmurs / rubs / gallops. S1 and S2 auscultated. No extremity edema. .  Abdomen: Soft, non-tender, distended secondary to body habitus. Bowel sounds positive.  GU: Deferred. Musculoskeletal: No clubbing / cyanosis of digits/nails. No joint deformity upper and lower extremities.  Skin: Has a diabetic foot ulcer and on a limited skin evaluation and did not turn to the sacral lesion. No induration; Warm and dry.  Neurologic: CN 2-12 grossly intact with no focal deficits. Romberg sign and cerebellar reflexes not assessed.  Psychiatric: Normal judgment and insight. Alert  and oriented x 3. Normal mood and appropriate affect.   Condition at discharge: stable  The results of significant diagnostics from this hospitalization (including imaging, microbiology, ancillary and laboratory) are listed below for reference.   Imaging Studies: DG Abd 1 View  Result Date: 10/13/2022 CLINICAL DATA:  Constipation. EXAM: ABDOMEN - 1 VIEW COMPARISON:  October 10, 2022. FINDINGS: No abnormal bowel dilatation. No significant stool burden is noted. No radio-opaque calculi or other significant radiographic abnormality are seen. IMPRESSION: No abnormal bowel dilatation. Electronically Signed   By: Lupita Raider M.D.   On: 10/13/2022 09:47   CT ABDOMEN PELVIS W CONTRAST  Result Date: 10/10/2022 CLINICAL DATA:  clinical constipation.  Abdominal discomfort. EXAM: CT ABDOMEN AND PELVIS WITH CONTRAST TECHNIQUE: Multidetector CT imaging of the abdomen and pelvis was performed using the standard protocol following bolus administration of intravenous contrast. RADIATION DOSE REDUCTION: This exam was performed according to the departmental dose-optimization program which includes automated exposure control, adjustment of the mA and/or kV according to patient size and/or use of iterative reconstruction technique. CONTRAST:  OMNIPAQUE IOHEXOL 300 MG/ML  SOLN COMPARISON:  None Available. FINDINGS: Lower chest: Centrilobular and paraseptal emphysema. Right lower lobe calcified granulomas. Motion degraded evaluation of the lung bases. 5 mm left lower lobe pulmonary nodule on 08/05 is most likely a subpleural lymph node adjacent the left major fissure. No specific imaging follow-up indicated per Fleischner criteria. Normal heart size without pericardial or pleural effusion. Three vessel coronary artery calcification. Hepatobiliary: Normal liver. Small dependent gallstones without acute cholecystitis or biliary duct dilatation. Pancreas: The pancreas is markedly atrophic/fatty replaced. Spleen: Normal  in size, without focal abnormality. Adrenals/Urinary Tract: Normal adrenal glands. Mild renal cortical thinning bilaterally. Bilateral too small to characterize renal lesions are most likely cysts . In the absence of clinically indicated signs/symptoms require(s) no independent follow-up. No hydronephrosis. Normal urinary bladder. Stomach/Bowel: Normal stomach, without wall thickening. 7.6 cm stool ball in the rectum. Mild perirectal edema identified posteriorly on 78/2. No  wall thickening. Large stool burden identified more proximally, including within the sigmoid colon. Normal terminal ileum. The appendix is upper normal sized on 62/2 but no periappendiceal inflammation is seen. Normal small bowel. Vascular/Lymphatic: Advanced aortic and branch vessel atherosclerosis. No abdominopelvic adenopathy. Reproductive: Normal prostate. Other: No significant free fluid.  No free intraperitoneal air. Musculoskeletal: Right greater than left hip osteoarthritis. Lower thoracic and upper lumbar degenerative disc disease. IMPRESSION: 1. Rectal stool ball is most consistent with fecal impaction. Mild perirectal edema suggests stercoral proctitis. 2. Motion degradation, primarily involving the lung bases. 3. Cholelithiasis 4. Markedly atrophic pancreas 5. Aortic atherosclerosis (ICD10-I70.0), coronary artery atherosclerosis and emphysema (ICD10-J43.9). Electronically Signed   By: Jeronimo Greaves M.D.   On: 10/10/2022 18:56   VAS [place] ABI WITH/WO TBI  Result Date: 09/23/2022  LOWER EXTREMITY DOPPLER STUDY Patient Name:  BRENNDEN THAKORE Mennenga JR.  Date of Exam:   09/23/2022 Medical Rec #: [id number]          Accession #:    [id number] Date of Birth: 04-May-1945          Patient Gender: M Patient Age:   68 years Exam Location:  [facility] Procedure:      VAS [place] ABI WITH/WO TBI Referring Phys: Helane Gunther --------------------------------------------------------------------------------  Indications: Claudication. Diabetic  polyneuropathy associated with type 2              diabetes mellitus High Risk Factors: Hypertension, hyperlipidemia, Diabetes, prior CVA.  Performing Technologist: [place] Ponder RVS  Examination Guidelines: A complete evaluation includes at minimum, Doppler waveform signals and systolic blood pressure reading at the level of bilateral brachial, anterior tibial, and posterior tibial arteries, when vessel segments are accessible. Bilateral testing is considered an integral part of a complete examination. Photoelectric Plethysmograph (PPG) waveforms and toe systolic pressure readings are included as required and additional duplex testing as needed. Limited examinations for reoccurring indications may be performed as noted.  ABI Findings: +---------+------------------+-----+--------+--------+ Right    Rt Pressure (mmHg)IndexWaveformComment  +---------+------------------+-----+--------+--------+ Brachial 159                                     +---------+------------------+-----+--------+--------+ PTA      255               1.55 biphasic         +---------+------------------+-----+--------+--------+ DP       202               1.23 biphasic         +---------+------------------+-----+--------+--------+ Great Toe124               0.76 Normal           +---------+------------------+-----+--------+--------+ +---------+------------------+-----+-----------+-------+ Left     Lt Pressure (mmHg)IndexWaveform   Comment +---------+------------------+-----+-----------+-------+ Brachial 164                                       +---------+------------------+-----+-----------+-------+ PTA      255               1.55 multiphasic        +---------+------------------+-----+-----------+-------+ DP       148               0.90 multiphasic        +---------+------------------+-----+-----------+-------+ Kathryne Eriksson  0.77 Normal              +---------+------------------+-----+-----------+-------+ +-------+-----------+-----------+------------+------------+ ABI/TBIToday's ABIToday's TBIPrevious ABIPrevious TBI +-------+-----------+-----------+------------+------------+ Right  [place]         0.76                                +-------+-----------+-----------+------------+------------+ Left   [place]         0.77                                +-------+-----------+-----------+------------+------------+  Summary: Right: Resting right ankle-brachial index indicates noncompressible right lower extremity arteries. The right toe-brachial index is normal. Left: Resting left ankle-brachial index indicates noncompressible left lower extremity arteries. The left toe-brachial index is normal. *See table(s) above for measurements and observations.  Electronically signed by Lemar Livings MD on 09/23/2022 at 7:10:10 PM.    Final    DG Foot Complete Right  Result Date: 09/22/2022 Please see detailed radiograph report in office note.   Microbiology: Results for orders placed or performed during the hospital encounter of 04/20/12  Surgical pcr screen     Status: None   Collection Time: 04/20/12  9:15 AM   Specimen: Nasal Mucosa; Nasal Swab  Result Value Ref Range Status   MRSA, PCR NEGATIVE NEGATIVE Final   Staphylococcus aureus NEGATIVE NEGATIVE Final    Comment:        The Xpert SA Assay (FDA approved for NASAL specimens in patients over 12 years of age), is one component of a comprehensive surveillance program.  Test performance has been validated by [employer] for patients greater than or equal to 32 year old. It is not intended to diagnose infection nor to guide or monitor treatment.   Labs: CBC: Recent Labs  Lab 10/10/22 1645 10/12/22 0332 10/13/22 0542 10/14/22 0314  WBC 13.7* 9.7 9.0 7.2  NEUTROABS 11.4*  --  6.3 4.2  HGB 15.8 14.9 14.7 13.7  HCT 46.7 45.1 45.2 41.4  MCV 99.6 103.2* 102.3* 103.8*  PLT 104* 94* 93*  86*   Basic Metabolic Panel: Recent Labs  Lab 10/10/22 1645 10/12/22 0332 10/13/22 0402 10/14/22 0314  NA 136 137 136 133*  K 4.3 4.0 4.8 4.8  CL 101 100 102 101  CO2 24 27 25 24   GLUCOSE 157* 123* 102* 248*  BUN 33* 24* 21 23  CREATININE 1.50* 1.43* 1.39* 1.35*  CALCIUM 8.8* 8.5* 8.2* 8.0*  MG  --  2.6* 2.6* 2.4  PHOS  --  3.2 3.2 2.9   Liver Function Tests: Recent Labs  Lab 10/10/22 1645 10/13/22 0402 10/14/22 0314  AST 37 40 48*  ALT 26 22 32  ALKPHOS 239* 168* 161*  BILITOT 1.0 0.8 0.6  PROT 8.3* 7.1 6.3*  ALBUMIN 3.2* 2.7* 2.4*   CBG: Recent Labs  Lab 10/13/22 1646 10/13/22 1716 10/13/22 2205 10/14/22 0723 10/14/22 1132  GLUCAP 43* 52* 385* 248* 320*   Discharge time spent: greater than 30 minutes.  Signed: Marguerita Merles, DO [facility] 10/15/2022

## 2022-10-14 NOTE — Progress Notes (Signed)
Discharge teaching complete with patient and his wife.

## 2022-10-25 ENCOUNTER — Other Ambulatory Visit (HOSPITAL_BASED_OUTPATIENT_CLINIC_OR_DEPARTMENT_OTHER): Payer: Self-pay

## 2022-10-25 DIAGNOSIS — E1042 Type 1 diabetes mellitus with diabetic polyneuropathy: Secondary | ICD-10-CM | POA: Diagnosis not present

## 2022-10-25 DIAGNOSIS — M79672 Pain in left foot: Secondary | ICD-10-CM | POA: Diagnosis not present

## 2022-10-25 DIAGNOSIS — M79671 Pain in right foot: Secondary | ICD-10-CM | POA: Diagnosis not present

## 2022-10-25 DIAGNOSIS — Z79891 Long term (current) use of opiate analgesic: Secondary | ICD-10-CM | POA: Diagnosis not present

## 2022-10-25 MED ORDER — NUCYNTA ER 200 MG PO TB12
200.0000 mg | ORAL_TABLET | Freq: Two times a day (BID) | ORAL | 0 refills | Status: DC
  Filled 2022-10-28: qty 60, 30d supply, fill #0

## 2022-10-25 MED ORDER — OXYCODONE HCL 15 MG PO TABS
15.0000 mg | ORAL_TABLET | Freq: Four times a day (QID) | ORAL | 0 refills | Status: DC | PRN
  Filled 2022-11-25: qty 120, 30d supply, fill #0

## 2022-10-25 MED ORDER — NUCYNTA ER 200 MG PO TB12
200.0000 mg | ORAL_TABLET | Freq: Two times a day (BID) | ORAL | 0 refills | Status: DC
  Filled 2022-11-25: qty 60, 30d supply, fill #0

## 2022-10-25 MED ORDER — OXYCODONE HCL 15 MG PO TABS
15.0000 mg | ORAL_TABLET | Freq: Four times a day (QID) | ORAL | 0 refills | Status: DC | PRN
  Filled 2022-10-28: qty 120, 30d supply, fill #0

## 2022-10-27 ENCOUNTER — Other Ambulatory Visit (HOSPITAL_BASED_OUTPATIENT_CLINIC_OR_DEPARTMENT_OTHER): Payer: Self-pay

## 2022-10-28 ENCOUNTER — Other Ambulatory Visit (HOSPITAL_BASED_OUTPATIENT_CLINIC_OR_DEPARTMENT_OTHER): Payer: Self-pay

## 2022-11-02 ENCOUNTER — Ambulatory Visit (INDEPENDENT_AMBULATORY_CARE_PROVIDER_SITE_OTHER): Payer: Medicare Other | Admitting: Vascular Surgery

## 2022-11-02 ENCOUNTER — Encounter: Payer: Self-pay | Admitting: Vascular Surgery

## 2022-11-02 VITALS — BP 122/66 | HR 62 | Temp 98.0°F | Resp 20 | Ht 72.0 in | Wt 204.0 lb

## 2022-11-02 DIAGNOSIS — E11621 Type 2 diabetes mellitus with foot ulcer: Secondary | ICD-10-CM

## 2022-11-02 DIAGNOSIS — L97511 Non-pressure chronic ulcer of other part of right foot limited to breakdown of skin: Secondary | ICD-10-CM

## 2022-11-02 NOTE — Progress Notes (Signed)
Patient ID: [name]., male   DOB: 1945-02-16, 77 y.o.   MRN: [id number]  Reason for Consult: New Patient (Initial Visit)   Referred by Helane Gunther, DPM  Subjective:     HPI:  [name]. is a 77 y.o. male with history of multiple sclerosis.  More recently he was found to have right second toe ulceration has been evaluated by podiatry.  He has no history of vascular disease but does have swelling of the bilateral lower extremities.  He has not been walking for approximately 30 years and is currently wheelchair-bound.  States that the wounds on the toes somewhat improving.  He denies fevers or chills.  Past Medical History:  Diagnosis Date   Arthritis    Benign prostatic hypertrophy with hesitancy    Charcot's arthropathy, diabetic (HCC)    left foot   Chronic pain syndrome followed by dr mark phillps at pain clinic   secondary to MS and diabetic neuropathy   Chronic periodontal disease    CKD (chronic kidney disease), stage II    hypertensive per PCP note   Coronary artery disease hx MI  s/p  DES to [facility] 2005   hx cardiologist-- dr Riley Kill until he retired,  now followed by pcp , dr Jacky Kindle   Depression    Diabetic neuropathy [facility])    Essential tremor    Gait disorder    uses walker   History of CVA (cerebrovascular accident)    2011-  right subcortical cva--  residual left mininmal side weakness and no use of left head   History of CVA with residual deficit    2011  --  right subcortical cva w/ mininmal left hemiparesis   History of diabetic ulcer of foot    2010  left foot   History of MI (myocardial infarction)    2005   Hypertension    dr Jacky Kindle   MS (multiple sclerosis) (HCC)    dx 1985   PONV (postoperative nausea and vomiting)    S/P drug eluting coronary stent placement    2005  to midRCA   Type 1 diabetes mellitus on insulin therapy (HCC)    Family History  Problem Relation Age of Onset   Tremor Mother    Heart disease Father    Celiac  disease Brother    Cancer - Prostate Paternal Grandfather    Past Surgical History:  Procedure Laterality Date   CATARACT EXTRACTION W/ INTRAOCULAR LENS  IMPLANT, BILATERAL  Nov 2016   CORONARY ANGIOPLASTY WITH STENT PLACEMENT  10-18-2003  dr Riley Kill   DES x1  to  [facility] (high-grade lesion), diffuse diabetic abnormalities throughout the entire coronary tree w/ diffuse calcification and segmental plaquing throughout but noncritical disease   HARDWARE REMOVAL Left 04/20/2012   Procedure: Left foot removal deep retained screw;  Surgeon: Nadara Mustard, MD;  [facility];  Service: Orthopedics;  Laterality: Left;   MULTIPLE EXTRACTIONS WITH ALVEOLOPLASTY N/A 05/15/2015   Procedure: MULTIPLE EXTRACTIONS;  Surgeon: Filbert Berthold, DDS;  Location: [facility];  Service: Dentistry;  Laterality: N/A;   ORIF ANKLE FRACTURE  12/30/2011   Procedure: OPEN REDUCTION INTERNAL FIXATION (ORIF) ANKLE FRACTURE;  Surgeon: Nadara Mustard, MD;  [facility]: [facility] OR;  Service: Orthopedics;  Laterality: Left;  Excision Bone, Abscess Left Charcot Foot, Place Antibiotic Beads, Fixation Charcot   REMOVAL SUBMANDIDULAR GLAND, BILATERAL  1970's   benign    Short Social History:  Social History  Tobacco Use   Smoking status: Former    Current packs/day: 0.00    Average packs/day: 0.5 packs/day for 26.0 years (13.0 ttl pk-yrs)    Types: Cigars, Cigarettes    Start date: 02/16/1986    Quit date: 02/17/2012    Years since quitting: 10.7   Smokeless tobacco: Never   Tobacco comments:    stopped cigar's 2014/  cigarette's stopped 1990's  Substance Use Topics   Alcohol use: No    No Known Allergies  Current Outpatient Medications  Medication Sig Dispense Refill   acetaminophen (TYLENOL) 325 MG tablet Take 2 tablets (650 mg total) by mouth every 6 (six) hours as needed for mild pain (or Fever >/= 101). 100 tablet 0   amLODipine (NORVASC) 5 MG tablet Take 5 mg by mouth every morning.      aspirin 81 MG tablet  Take 81 mg by mouth daily.     atorvastatin (LIPITOR) 10 MG tablet Take 10 mg by mouth every morning.      bisacodyl (DULCOLAX) 5 MG EC tablet Take 2 tablets (10 mg total) by mouth daily as needed for moderate constipation. 30 tablet 0   clopidogrel (PLAVIX) 75 MG tablet Take 75 mg by mouth daily.     DULoxetine (CYMBALTA) 60 MG capsule Take 60 mg by mouth 2 (two) times daily.     finasteride (PROSCAR) 5 MG tablet Take 5 mg by mouth every morning.      Insulin NPH, Human,, Isophane, (HUMULIN N KWIKPEN) 100 UNIT/ML Kiwkpen Inject 10-30 Units into the skin 2 (two) times daily. Inject 30 units in the morning and Inject 10-15 units in the evening.     metoprolol tartrate (LOPRESSOR) 25 MG tablet Take 25 mg by mouth 2 (two) times daily.     nitroGLYCERIN (NITRO-DUR) 0.4 mg/hr patch Apply patch to the white flesh of the toe daily. 30 patch 0   nystatin cream-hydrocortisone cream-zinc oxide Apply topically 3 (three) times daily. 90 g 0   ondansetron (ZOFRAN) 4 MG tablet Take 1 tablet (4 mg total) by mouth every 6 (six) hours as needed for nausea. 20 tablet 0   oxyCODONE (ROXICODONE) 15 MG immediate release tablet Take 1 tablet (15 mg total) by mouth every 6 (six) hours as needed for pain. 120 tablet 0   oxyCODONE (ROXICODONE) 15 MG immediate release tablet Take 1 tablet (15 mg total) by mouth every 6 (six) hours as needed for pain. 120 tablet 0   [START ON 11/25/2022] oxyCODONE (ROXICODONE) 15 MG immediate release tablet Take 1 tablet (15 mg total) by mouth every 6 (six) hours as needed for pain. 120 tablet 0   Polyethylene Glycol 3350 (PEG 3350) 17 GM/SCOOP POWD Take 17 g by mouth 2 (two) times daily. 238 g 0   senna-docusate (SENOKOT-S) 8.6-50 MG tablet Take 1 tablet by mouth 2 (two) times daily. 60 tablet 0   Tapentadol HCl (NUCYNTA ER) 200 MG TB12 Take 1 tablet (200 mg total) by mouth every 12 (twelve) hours. 60 tablet 0   Tapentadol HCl (NUCYNTA ER) 200 MG TB12 Take 1 tablet (200 mg total) by mouth  every 12 (twelve) hours. 60 tablet 0   [START ON 11/25/2022] Tapentadol HCl (NUCYNTA ER) 200 MG TB12 Take 1 tablet (200 mg total) by mouth every 12 (twelve) hours. 60 tablet 0   traZODone (DESYREL) 150 MG tablet Take 150 mg by mouth at bedtime.     No current facility-administered medications for this visit.    Review of  Systems  Constitutional:  Constitutional negative. HENT: HENT negative.  Eyes: Eyes negative.  Respiratory: Respiratory negative.  Cardiovascular: Cardiovascular negative.  GI: Gastrointestinal negative.  Skin: Positive for wound.  Neurological: Positive for focal weakness and numbness.  Hematologic: Hematologic/lymphatic negative.  Psychiatric: Psychiatric negative.        Objective:  Objective   Vitals:   11/02/22 1356  BP: 122/66  Pulse: 62  Resp: 20  Temp: 98 F (36.7 C)  SpO2: 92%  Weight: 204 lb (92.5 kg)  Height: 6' (1.829 m)   Body mass index is 27.67 kg/m.  Physical Exam HENT:     Head: Normocephalic.  Eyes:     Pupils: Pupils are equal, round, and reactive to light.  Cardiovascular:     Pulses: Normal pulses.  Pulmonary:     Effort: Pulmonary effort is normal.  Abdominal:     General: Abdomen is flat.     Palpations: Abdomen is soft.  Skin:    Capillary Refill: Capillary refill takes less than 2 seconds.  Neurological:     Mental Status: He is alert.     Comments: Wheelchair bound  Psychiatric:        Mood and Affect: Mood normal.        Thought Content: Thought content normal.        Judgment: Judgment normal.     Data: ABI Findings:  +---------+------------------+-----+--------+--------+  Right   Rt Pressure (mmHg)IndexWaveformComment   +---------+------------------+-----+--------+--------+  Brachial 159                                      +---------+------------------+-----+--------+--------+  PTA     255               1.55 biphasic          +---------+------------------+-----+--------+--------+   DP      202               1.23 biphasic          +---------+------------------+-----+--------+--------+  [place]               0.76 Normal            +---------+------------------+-----+--------+--------+   +---------+------------------+-----+-----------+-------+  Left    Lt Pressure (mmHg)IndexWaveform   Comment  +---------+------------------+-----+-----------+-------+  Brachial 164                                        +---------+------------------+-----+-----------+-------+  PTA     255               1.55 multiphasic         +---------+------------------+-----+-----------+-------+  DP      148               0.90 multiphasic         +---------+------------------+-----+-----------+-------+  Great Toe126               0.77 Normal              +---------+------------------+-----+-----------+-------+   +-------+-----------+-----------+------------+------------+  ABI/TBIToday's ABIToday's TBIPrevious ABIPrevious TBI  +-------+-----------+-----------+------------+------------+  Right [place]         0.76                                 +-------+-----------+-----------+------------+------------+  Left  [place]         0.77                                 +-------+-----------+-----------+------------+------------+         Summary:  Right: Resting right ankle-brachial index indicates noncompressible right  lower extremity arteries. The right toe-brachial index is normal.   Left: Resting left ankle-brachial index indicates noncompressible left  lower extremity arteries. The left toe-brachial index is normal.       Assessment/Plan:    77 year old male with history as above with small ulceration on the right second toe but palpable pulses and brisk capillary refill.  I have recommended elevation of his legs as tolerated and follow-up with podiatry and he can see me on an as-needed basis.     Maeola Harman  MD [facility]

## 2022-11-07 ENCOUNTER — Encounter: Payer: Self-pay | Admitting: Podiatry

## 2022-11-07 ENCOUNTER — Ambulatory Visit (INDEPENDENT_AMBULATORY_CARE_PROVIDER_SITE_OTHER): Payer: Medicare Other | Admitting: Podiatry

## 2022-11-07 DIAGNOSIS — L97512 Non-pressure chronic ulcer of other part of right foot with fat layer exposed: Secondary | ICD-10-CM

## 2022-11-07 DIAGNOSIS — M2041 Other hammer toe(s) (acquired), right foot: Secondary | ICD-10-CM | POA: Diagnosis not present

## 2022-11-07 DIAGNOSIS — E0843 Diabetes mellitus due to underlying condition with diabetic autonomic (poly)neuropathy: Secondary | ICD-10-CM | POA: Diagnosis not present

## 2022-11-07 NOTE — Progress Notes (Signed)
Chief Complaint  Patient presents with   Diabetic Ulcer    Right 2nd toe. Wife changing dressings with betadine every other day    Subjective:  77 y.o. male with PMHx of diabetes mellitus nonambulatory presenting for follow-up evaluation of an ulcer to the PIPJ of the second digit of the right foot.  They have been applying Betadine and dressings.  Patient was seen by vascular on 11/02/2022.  Greatly appreciated.  For now they are simply observing.  Past Medical History:  Diagnosis Date   Arthritis    Benign prostatic hypertrophy with hesitancy    Charcot's arthropathy, diabetic (HCC)    left foot   Chronic pain syndrome followed by dr mark phillps at pain clinic   secondary to MS and diabetic neuropathy   Chronic periodontal disease    CKD (chronic kidney disease), stage II    hypertensive per PCP note   Coronary artery disease hx MI  s/p  DES to [facility] 2005   hx cardiologist-- dr Riley Kill until he retired,  now followed by pcp , dr Jacky Kindle   Depression    Diabetic neuropathy [facility])    Essential tremor    Gait disorder    uses walker   History of CVA (cerebrovascular accident)    2011-  right subcortical cva--  residual left mininmal side weakness and no use of left head   History of CVA with residual deficit    2011  --  right subcortical cva w/ mininmal left hemiparesis   History of diabetic ulcer of foot    2010  left foot   History of MI (myocardial infarction)    2005   Hypertension    dr Jacky Kindle   MS (multiple sclerosis) (HCC)    dx 1985   PONV (postoperative nausea and vomiting)    S/P drug eluting coronary stent placement    2005  to midRCA   Type 1 diabetes mellitus on insulin therapy [facility])     Past Surgical History:  Procedure Laterality Date   CATARACT EXTRACTION W/ INTRAOCULAR LENS  IMPLANT, BILATERAL  Nov 2016   CORONARY ANGIOPLASTY WITH STENT PLACEMENT  10-18-2003  dr Riley Kill   DES x1  to  mRCA (high-grade lesion), diffuse diabetic abnormalities  throughout the entire coronary tree w/ diffuse calcification and segmental plaquing throughout but noncritical disease   HARDWARE REMOVAL Left 04/20/2012   Procedure: Left foot removal deep retained screw;  Surgeon: Nadara Mustard, MD;  [facility] OR;  Service: Orthopedics;  Laterality: Left;   MULTIPLE EXTRACTIONS WITH ALVEOLOPLASTY N/A 05/15/2015   Procedure: MULTIPLE EXTRACTIONS;  Surgeon: Filbert Berthold, DDS;  Location: [facility];  Service: Dentistry;  Laterality: N/A;   ORIF ANKLE FRACTURE  12/30/2011   Procedure: OPEN REDUCTION INTERNAL FIXATION (ORIF) ANKLE FRACTURE;  Surgeon: Nadara Mustard, MD;  [facility]: [facility] OR;  Service: Orthopedics;  Laterality: Left;  Excision Bone, Abscess Left Charcot Foot, Place Antibiotic Beads, Fixation Charcot   REMOVAL SUBMANDIDULAR GLAND, BILATERAL  1970's   benign    No Known Allergies   Objective/Physical Exam General: The patient is alert and oriented x3 in no acute distress.  Dermatology:  Wound #1 noted to the dorsal last of the right second toe overlying the PIPJ.  The wound measures approximately 0.3 x 0.4 x 0.2 cm (LxWxD).  There is some slight concern since the phalanx bone is not far from the wound but currently there is no obvious exposure of bone.  To the noted  ulceration(s), there is no eschar. There is a moderate amount of slough, fibrin, and necrotic tissue noted. Granulation tissue and wound base is red. There is a minimal amount of serosanguineous drainage noted. There is no exposed bone muscle-tendon ligament or joint. There is no malodor. Periwound integrity is intact. Skin is warm, dry and supple bilateral lower extremities.  Vascular: VAS [place] ABI WITH/WO TBI 09/23/2022 ABI Findings:  +---------+------------------+-----+--------+--------+  Right   Rt Pressure (mmHg)IndexWaveformComment   +---------+------------------+-----+--------+--------+  Brachial 159                                       +---------+------------------+-----+--------+--------+  PTA     255               1.55 biphasic          +---------+------------------+-----+--------+--------+  DP      202               1.23 biphasic          +---------+------------------+-----+--------+--------+  Great Toe124               0.76 Normal            +---------+------------------+-----+--------+--------+   +---------+------------------+-----+-----------+-------+  Left    Lt Pressure (mmHg)IndexWaveform   Comment  +---------+------------------+-----+-----------+-------+  Brachial 164                                        +---------+------------------+-----+-----------+-------+  PTA     255               1.55 multiphasic         +---------+------------------+-----+-----------+-------+  DP      148               0.90 multiphasic         +---------+------------------+-----+-----------+-------+  Great Toe126               0.77 Normal              +---------+------------------+-----+-----------+-------+   +-------+-----------+-----------+------------+------------+  ABI/TBIToday's ABIToday's TBIPrevious ABIPrevious TBI  +-------+-----------+-----------+------------+------------+  Right [place]         0.76                                 +-------+-----------+-----------+------------+------------+  Left  [place]         0.77                                 +-------+-----------+-----------+------------+------------+  Summary:  Right: Resting right ankle-brachial index indicates noncompressible right lower extremity arteries. The right toe-brachial index is normal.  Left: Resting left ankle-brachial index indicates noncompressible left  lower extremity arteries. The left toe-brachial index is normal.   Neurological: Light touch and protective threshold absent  Musculoskeletal Exam: Nonambulatory. No prior amputations.   Assessment: 1. Ulcer right second toe secondary  to diabetes mellitus 2. diabetes mellitus w/ peripheral neuropathy 3. Peripheral vascular disease  4.  Hammertoe deformity second digit right  Plan of Care:  -Patient was evaluated. -medically necessary excisional debridement including subcutaneous tissue was performed using a tissue nipper and a chisel blade. Excisional debridement of all the necrotic nonviable tissue down  to healthy bleeding viable tissue was performed with post-debridement measurements same as pre-. -the wound was cleansed and dry sterile dressing applied. -Patient continues to be high risk of loss of the toe. After evaluating the toe I'd like to be more proactive with arthroplasty of the PIPJ which would essentially resect the hypertrophic portion of bone and excised the overlying ulcer.  I would like to perform this before the onset of osteomyelitis which currently is not present.  I do believe the arthroplasty of the toe in this area should alleviate the rubbing and potentially salvage the toe and prevent the onset of osteomyelitis.  Risk benefits advantages disadvantages of the procedure were explained in detail to the patient.  No guarantees were expressed or implied.  I do believe this is a procedure that could be performed here in the office without anesthesia.  The patient consents and agrees. -Authorization for surgery was initiated today.  The procedure will consist of arthroplasty right second toe performed here in the office under local anesthesia -Return to clinic morning of in office procedure   Felecia Shelling, DPM [facility]  Dr. Felecia Shelling, DPM    [address] [place], [place] [address]                Office [phone]  Fax [phone]

## 2022-11-14 ENCOUNTER — Other Ambulatory Visit (HOSPITAL_BASED_OUTPATIENT_CLINIC_OR_DEPARTMENT_OTHER): Payer: Self-pay

## 2022-11-14 DIAGNOSIS — N1831 Chronic kidney disease, stage 3a: Secondary | ICD-10-CM | POA: Diagnosis not present

## 2022-11-14 DIAGNOSIS — E1059 Type 1 diabetes mellitus with other circulatory complications: Secondary | ICD-10-CM | POA: Diagnosis not present

## 2022-11-14 DIAGNOSIS — L8992 Pressure ulcer of unspecified site, stage 2: Secondary | ICD-10-CM | POA: Diagnosis not present

## 2022-11-14 DIAGNOSIS — Z1331 Encounter for screening for depression: Secondary | ICD-10-CM | POA: Diagnosis not present

## 2022-11-14 DIAGNOSIS — Z1339 Encounter for screening examination for other mental health and behavioral disorders: Secondary | ICD-10-CM | POA: Diagnosis not present

## 2022-11-14 DIAGNOSIS — I129 Hypertensive chronic kidney disease with stage 1 through stage 4 chronic kidney disease, or unspecified chronic kidney disease: Secondary | ICD-10-CM | POA: Diagnosis not present

## 2022-11-14 DIAGNOSIS — Z23 Encounter for immunization: Secondary | ICD-10-CM | POA: Diagnosis not present

## 2022-11-14 DIAGNOSIS — E1029 Type 1 diabetes mellitus with other diabetic kidney complication: Secondary | ICD-10-CM | POA: Diagnosis not present

## 2022-11-14 DIAGNOSIS — G35 Multiple sclerosis: Secondary | ICD-10-CM | POA: Diagnosis not present

## 2022-11-14 DIAGNOSIS — Z794 Long term (current) use of insulin: Secondary | ICD-10-CM | POA: Diagnosis not present

## 2022-11-14 DIAGNOSIS — L97519 Non-pressure chronic ulcer of other part of right foot with unspecified severity: Secondary | ICD-10-CM | POA: Diagnosis not present

## 2022-11-14 DIAGNOSIS — Z0000 Encounter for general adult medical examination without abnormal findings: Secondary | ICD-10-CM | POA: Diagnosis not present

## 2022-11-14 DIAGNOSIS — G894 Chronic pain syndrome: Secondary | ICD-10-CM | POA: Diagnosis not present

## 2022-11-14 DIAGNOSIS — I2510 Atherosclerotic heart disease of native coronary artery without angina pectoris: Secondary | ICD-10-CM | POA: Diagnosis not present

## 2022-11-14 DIAGNOSIS — E538 Deficiency of other specified B group vitamins: Secondary | ICD-10-CM | POA: Diagnosis not present

## 2022-11-14 MED ORDER — DOXYCYCLINE HYCLATE 100 MG PO CAPS
100.0000 mg | ORAL_CAPSULE | Freq: Two times a day (BID) | ORAL | 0 refills | Status: DC
  Filled 2022-11-14: qty 28, 14d supply, fill #0

## 2022-11-21 ENCOUNTER — Ambulatory Visit (INDEPENDENT_AMBULATORY_CARE_PROVIDER_SITE_OTHER): Payer: Medicare Other | Admitting: Podiatry

## 2022-11-21 ENCOUNTER — Encounter: Payer: Self-pay | Admitting: Podiatry

## 2022-11-21 ENCOUNTER — Other Ambulatory Visit (HOSPITAL_BASED_OUTPATIENT_CLINIC_OR_DEPARTMENT_OTHER): Payer: Self-pay

## 2022-11-21 VITALS — HR 72 | Temp 98.0°F | Resp 18 | Ht 72.0 in | Wt 204.0 lb

## 2022-11-21 DIAGNOSIS — M2041 Other hammer toe(s) (acquired), right foot: Secondary | ICD-10-CM | POA: Diagnosis not present

## 2022-11-21 MED ORDER — DOXYCYCLINE HYCLATE 100 MG PO CAPS
100.0000 mg | ORAL_CAPSULE | Freq: Two times a day (BID) | ORAL | 0 refills | Status: DC
  Filled 2022-11-21 – 2022-11-24 (×3): qty 20, 10d supply, fill #0

## 2022-11-21 NOTE — Progress Notes (Signed)
OPERATIVE REPORT Patient name: [name] Crothers Ashurst Jr. MRN: [id number] DOB: 02/06/1946  DOS:  11/21/22  Preop Dx: Lance Morin with overlying ulcer PIPJ second digit right Postop Dx: same  Procedure:  1.  Hammertoe arthroplasty second digit right foot  Surgeon: Felecia Shelling DPM  Anesthesia: 2% lidocaine plain totaling 3 mL infiltrated around the surgical area  Hemostasis: None  EBL: Minimal mL Materials: None Injectables: None Pathology: None  Condition: The patient tolerated the procedure and anesthesia well. No complications noted or reported   Justification for procedure: The patient is a 77 y.o. male with history of nonhealing chronic ulcer overlying the PIPJ of the second digit right foot.  Conservative modalities of been unsuccessful in providing any sort of satisfactory alleviation of symptoms with the patient. The patient was told benefits as well as possible side effects of the surgery. The patient consented for surgical correction. The patient consent form was reviewed. All patient questions were answered. No guarantees were expressed or implied.   Procedure in Detail: The patient was brought to the procedure room, placed in the procedure chair in the supine position at which time an aseptic scrub and drape were performed about the patient's respective lower extremity after anesthesia was induced as described above. Attention was then directed to the surgical area where procedure number one commenced.  Procedure #1: Hammertoe arthroplasty second digit right foot After the toe was prepped in aseptic manner a transverse elliptical incision was planned and made encompassing the ulcer which is overlying the PIPJ of the toe.  The ellipse skin wedge and as well as the ulcer was removed in toto which exposed the underlying joint.  Bone-cutting forceps were utilized to resect away the hypertrophic portion of the head of the proximal phalanx as well as the base of the middle phalanx.   This alleviated the prominence from the hammertoe which was creating the overlying ulcer.  Copious irrigation was then utilized and primary closure was achieved using 4-0 nylon suture.  Dry sterile compressive dressings were then applied about the patient's lower extremity. The patient was then discharged from the office with adequate prescriptions for analgesia. Verbal as well as written instructions were provided for the patient regarding postprocedural care. The patient is to keep the dressings clean dry and intact until they are to follow up in the office upon discharge in one week.  Resume anticoagulant tomorrow regular scheduled dose.  Surgical shoe dispensed.  WBAT  Felecia Shelling, DPM [facility]  Dr. Felecia Shelling, DPM    [address] [place], [place] [address]                Office [phone]  Fax [phone]

## 2022-11-24 ENCOUNTER — Other Ambulatory Visit (HOSPITAL_BASED_OUTPATIENT_CLINIC_OR_DEPARTMENT_OTHER): Payer: Self-pay

## 2022-11-24 ENCOUNTER — Other Ambulatory Visit: Payer: Self-pay

## 2022-11-24 ENCOUNTER — Other Ambulatory Visit: Payer: Self-pay | Admitting: Podiatry

## 2022-11-24 MED ORDER — DOXYCYCLINE HYCLATE 100 MG PO CAPS
100.0000 mg | ORAL_CAPSULE | Freq: Two times a day (BID) | ORAL | 0 refills | Status: DC
  Filled 2022-11-24: qty 28, 14d supply, fill #0

## 2022-11-25 ENCOUNTER — Other Ambulatory Visit (HOSPITAL_BASED_OUTPATIENT_CLINIC_OR_DEPARTMENT_OTHER): Payer: Self-pay

## 2022-11-28 ENCOUNTER — Ambulatory Visit (INDEPENDENT_AMBULATORY_CARE_PROVIDER_SITE_OTHER): Payer: Medicare Other | Admitting: Podiatry

## 2022-11-28 ENCOUNTER — Encounter: Payer: Self-pay | Admitting: Podiatry

## 2022-11-28 DIAGNOSIS — M2041 Other hammer toe(s) (acquired), right foot: Secondary | ICD-10-CM

## 2022-11-28 NOTE — Progress Notes (Signed)
Chief Complaint  Patient presents with   Routine Post Op    PATIENT STATES THAT EVERYTHING IS FINE , NO PAIN     Subjective:  Patient presents today status post right second toe arthroplasty performed here in the office on 11/21/2022.  Patient doing well.  Dressings are clean dry and intact.  No new complaints  Past Medical History:  Diagnosis Date   Arthritis    Benign prostatic hypertrophy with hesitancy    Charcot's arthropathy, diabetic (HCC)    left foot   Chronic pain syndrome followed by dr mark phillps at pain clinic   secondary to MS and diabetic neuropathy   Chronic periodontal disease    CKD (chronic kidney disease), stage II    hypertensive per PCP note   Coronary artery disease hx MI  s/p  DES to [facility] 2005   hx cardiologist-- dr Riley Kill until he retired,  now followed by pcp , dr Jacky Kindle   Depression    Diabetic neuropathy [facility])    Essential tremor    Gait disorder    uses walker   History of CVA (cerebrovascular accident)    2011-  right subcortical cva--  residual left mininmal side weakness and no use of left head   History of CVA with residual deficit    2011  --  right subcortical cva w/ mininmal left hemiparesis   History of diabetic ulcer of foot    2010  left foot   History of MI (myocardial infarction)    2005   Hypertension    dr Jacky Kindle   MS (multiple sclerosis) (HCC)    dx 1985   PONV (postoperative nausea and vomiting)    S/P drug eluting coronary stent placement    2005  to midRCA   Type 1 diabetes mellitus on insulin therapy [facility])     Past Surgical History:  Procedure Laterality Date   CATARACT EXTRACTION W/ INTRAOCULAR LENS  IMPLANT, BILATERAL  Nov 2016   CORONARY ANGIOPLASTY WITH STENT PLACEMENT  10-18-2003  dr Riley Kill   DES x1  to  mRCA (high-grade lesion), diffuse diabetic abnormalities throughout the entire coronary tree w/ diffuse calcification and segmental plaquing throughout but noncritical disease   HARDWARE REMOVAL Left  04/20/2012   Procedure: Left foot removal deep retained screw;  Surgeon: Nadara Mustard, MD;  [facility] OR;  Service: Orthopedics;  Laterality: Left;   MULTIPLE EXTRACTIONS WITH ALVEOLOPLASTY N/A 05/15/2015   Procedure: MULTIPLE EXTRACTIONS;  Surgeon: Filbert Berthold, DDS;  Location: [facility] [place];  Service: Dentistry;  Laterality: N/A;   ORIF ANKLE FRACTURE  12/30/2011   Procedure: OPEN REDUCTION INTERNAL FIXATION (ORIF) ANKLE FRACTURE;  Surgeon: Nadara Mustard, MD;  [facility]: [facility] OR;  Service: Orthopedics;  Laterality: Left;  Excision Bone, Abscess Left Charcot Foot, Place Antibiotic Beads, Fixation Charcot   REMOVAL SUBMANDIDULAR GLAND, BILATERAL  1970's   benign    No Known Allergies  Objective/Physical Exam Neurovascular status intact.  The incision across the second toe is actually stable with good potential for healing.  There is no erythema or edema around the toe.  Clinically there is no indication of cellulitis or acute infection  Assessment: 1. s/p right second toe arthroplasty. DOS: 11/21/2022   Plan of Care:  -Patient was evaluated.  -Overall the toe demonstrates good potential for healing -Dressings changed.  Leave clean dry and intact -Continue WBAT surgical shoe -Return to clinic 1 week  Felecia Shelling, DPM [facility]  Dr. Felecia Shelling, DPM    [address] [place], [place] [address]                Office [phone]  Fax [phone]

## 2022-11-30 ENCOUNTER — Other Ambulatory Visit (HOSPITAL_BASED_OUTPATIENT_CLINIC_OR_DEPARTMENT_OTHER): Payer: Self-pay

## 2022-12-05 ENCOUNTER — Encounter: Payer: Medicare Other | Admitting: Podiatry

## 2022-12-07 ENCOUNTER — Ambulatory Visit (INDEPENDENT_AMBULATORY_CARE_PROVIDER_SITE_OTHER): Payer: Medicare Other | Admitting: Podiatry

## 2022-12-07 DIAGNOSIS — M2041 Other hammer toe(s) (acquired), right foot: Secondary | ICD-10-CM

## 2022-12-13 ENCOUNTER — Telehealth: Payer: Self-pay | Admitting: Podiatry

## 2022-12-13 NOTE — Telephone Encounter (Signed)
Hello, This pt had an appointment last week (Pt fell on the way to the appointment and had an ambulance assist getting him off the ground for the appointment) This pt's spouse states that you've requested home health care to reach out and address the wounds weekly but she states she hasn't received or seen anything from anyone in regards to him getting his wounds checked at home. Could you help clarify some information for this pt so I may call her back.

## 2022-12-13 NOTE — Telephone Encounter (Signed)
Note dictated.  Referral placed in the patient's chart.  Heather could you please fax this to [facility] or whoever we send referrals to home health??? THANKS!!! -Dr. Logan Bores

## 2022-12-13 NOTE — Telephone Encounter (Signed)
Pts wife called back again because they have still not heard from home health agency from appt last week. He is to be having the bandage changed today or tomorrow.

## 2022-12-13 NOTE — Progress Notes (Signed)
Chief Complaint  Patient presents with   Wound Check    DIABETIC, NO PAIN, DENIES N/V/F/C/SOB, STILL TAKING ANTIBIOTICS    Subjective:  Patient presents today status post right second toe arthroplasty performed here in the office on 11/21/2022.  Patient doing well.  Dressings are clean dry and intact.  WBAT surgical shoe.  No new complaints  Past Medical History:  Diagnosis Date   Arthritis    Benign prostatic hypertrophy with hesitancy    Charcot's arthropathy, diabetic (HCC)    left foot   Chronic pain syndrome followed by dr mark phillps at pain clinic   secondary to MS and diabetic neuropathy   Chronic periodontal disease    CKD (chronic kidney disease), stage II    hypertensive per PCP note   Coronary artery disease hx MI  s/p  DES to [facility] 2005   hx cardiologist-- dr Riley Kill until he retired,  now followed by pcp , dr Jacky Kindle   Depression    Diabetic neuropathy [facility])    Essential tremor    Gait disorder    uses walker   History of CVA (cerebrovascular accident)    2011-  right subcortical cva--  residual left mininmal side weakness and no use of left head   History of CVA with residual deficit    2011  --  right subcortical cva w/ mininmal left hemiparesis   History of diabetic ulcer of foot    2010  left foot   History of MI (myocardial infarction)    2005   Hypertension    dr Jacky Kindle   MS (multiple sclerosis) (HCC)    dx 1985   PONV (postoperative nausea and vomiting)    S/P drug eluting coronary stent placement    2005  to midRCA   Type 1 diabetes mellitus on insulin therapy [facility])     Past Surgical History:  Procedure Laterality Date   CATARACT EXTRACTION W/ INTRAOCULAR LENS  IMPLANT, BILATERAL  Nov 2016   CORONARY ANGIOPLASTY WITH STENT PLACEMENT  10-18-2003  dr Riley Kill   DES x1  to  mRCA (high-grade lesion), diffuse diabetic abnormalities throughout the entire coronary tree w/ diffuse calcification and segmental plaquing throughout but noncritical  disease   HARDWARE REMOVAL Left 04/20/2012   Procedure: Left foot removal deep retained screw;  Surgeon: Nadara Mustard, MD;  [facility] OR;  Service: Orthopedics;  Laterality: Left;   MULTIPLE EXTRACTIONS WITH ALVEOLOPLASTY N/A 05/15/2015   Procedure: MULTIPLE EXTRACTIONS;  Surgeon: Filbert Berthold, DDS;  Location: [facility];  Service: Dentistry;  Laterality: N/A;   ORIF ANKLE FRACTURE  12/30/2011   Procedure: OPEN REDUCTION INTERNAL FIXATION (ORIF) ANKLE FRACTURE;  Surgeon: Nadara Mustard, MD;  Location: [facility] OR;  Service: Orthopedics;  Laterality: Left;  Excision Bone, Abscess Left Charcot Foot, Place Antibiotic Beads, Fixation Charcot   REMOVAL SUBMANDIDULAR GLAND, BILATERAL  1970's   benign    No Known Allergies  Objective/Physical Exam Stable.  Neurovascular status intact.  The incision across the second toe is actually stable with good potential for healing.  There is no erythema or edema around the toe.  Currently no concern for acute infection extending proximally around the foot  Assessment: 1. s/p right second toe arthroplasty. DOS: 11/21/2022   Plan of Care:  -Patient was evaluated.  -Toe appears stable.  There continues to be high risk of amputation but for the moment we will continue postop care -Dressings changed -Continue WBAT surgical shoe -Orders placed for home  health care dressing changes: Recommend Betadine wet-to-dry gauze dressings to the toe with kerlex or kling and 4" Ace wrap. Change 2-3x/wk.  -Return to clinic 2 weeks  Felecia Shelling, DPM [facility]  Dr. Felecia Shelling, DPM    [address] [place], [place] [address]                Office [phone]  Fax [phone]

## 2022-12-15 ENCOUNTER — Other Ambulatory Visit (HOSPITAL_BASED_OUTPATIENT_CLINIC_OR_DEPARTMENT_OTHER): Payer: Self-pay

## 2022-12-15 DIAGNOSIS — Z9842 Cataract extraction status, left eye: Secondary | ICD-10-CM | POA: Diagnosis not present

## 2022-12-15 DIAGNOSIS — Z955 Presence of coronary angioplasty implant and graft: Secondary | ICD-10-CM | POA: Diagnosis not present

## 2022-12-15 DIAGNOSIS — Z993 Dependence on wheelchair: Secondary | ICD-10-CM | POA: Diagnosis not present

## 2022-12-15 DIAGNOSIS — Z471 Aftercare following joint replacement surgery: Secondary | ICD-10-CM | POA: Diagnosis not present

## 2022-12-15 DIAGNOSIS — Z9181 History of falling: Secondary | ICD-10-CM | POA: Diagnosis not present

## 2022-12-15 DIAGNOSIS — Z8673 Personal history of transient ischemic attack (TIA), and cerebral infarction without residual deficits: Secondary | ICD-10-CM | POA: Diagnosis not present

## 2022-12-15 DIAGNOSIS — Z8631 Personal history of diabetic foot ulcer: Secondary | ICD-10-CM | POA: Diagnosis not present

## 2022-12-15 DIAGNOSIS — Z79891 Long term (current) use of opiate analgesic: Secondary | ICD-10-CM | POA: Diagnosis not present

## 2022-12-15 DIAGNOSIS — E1040 Type 1 diabetes mellitus with diabetic neuropathy, unspecified: Secondary | ICD-10-CM | POA: Diagnosis not present

## 2022-12-15 DIAGNOSIS — Z96698 Presence of other orthopedic joint implants: Secondary | ICD-10-CM | POA: Diagnosis not present

## 2022-12-15 DIAGNOSIS — G894 Chronic pain syndrome: Secondary | ICD-10-CM | POA: Diagnosis not present

## 2022-12-15 DIAGNOSIS — Z961 Presence of intraocular lens: Secondary | ICD-10-CM | POA: Diagnosis not present

## 2022-12-15 DIAGNOSIS — M1990 Unspecified osteoarthritis, unspecified site: Secondary | ICD-10-CM | POA: Diagnosis not present

## 2022-12-15 DIAGNOSIS — I252 Old myocardial infarction: Secondary | ICD-10-CM | POA: Diagnosis not present

## 2022-12-15 DIAGNOSIS — Z7902 Long term (current) use of antithrombotics/antiplatelets: Secondary | ICD-10-CM | POA: Diagnosis not present

## 2022-12-15 DIAGNOSIS — I2510 Atherosclerotic heart disease of native coronary artery without angina pectoris: Secondary | ICD-10-CM | POA: Diagnosis not present

## 2022-12-15 DIAGNOSIS — G35 Multiple sclerosis: Secondary | ICD-10-CM | POA: Diagnosis not present

## 2022-12-15 DIAGNOSIS — M14672 Charcot's joint, left ankle and foot: Secondary | ICD-10-CM | POA: Diagnosis not present

## 2022-12-15 DIAGNOSIS — I129 Hypertensive chronic kidney disease with stage 1 through stage 4 chronic kidney disease, or unspecified chronic kidney disease: Secondary | ICD-10-CM | POA: Diagnosis not present

## 2022-12-15 DIAGNOSIS — N400 Enlarged prostate without lower urinary tract symptoms: Secondary | ICD-10-CM | POA: Diagnosis not present

## 2022-12-15 DIAGNOSIS — N182 Chronic kidney disease, stage 2 (mild): Secondary | ICD-10-CM | POA: Diagnosis not present

## 2022-12-15 DIAGNOSIS — F32A Depression, unspecified: Secondary | ICD-10-CM | POA: Diagnosis not present

## 2022-12-15 NOTE — Telephone Encounter (Signed)
Called pts wife to confirm homehealth had contacted pt and she said yes that they came today to see pt and are scheduled to come 2 times next week. She said it  was great and is the same lady they had before so she is familiar with him.

## 2022-12-19 DIAGNOSIS — Z471 Aftercare following joint replacement surgery: Secondary | ICD-10-CM | POA: Diagnosis not present

## 2022-12-19 DIAGNOSIS — M14672 Charcot's joint, left ankle and foot: Secondary | ICD-10-CM | POA: Diagnosis not present

## 2022-12-19 DIAGNOSIS — G35 Multiple sclerosis: Secondary | ICD-10-CM | POA: Diagnosis not present

## 2022-12-19 DIAGNOSIS — M1990 Unspecified osteoarthritis, unspecified site: Secondary | ICD-10-CM | POA: Diagnosis not present

## 2022-12-19 DIAGNOSIS — E1040 Type 1 diabetes mellitus with diabetic neuropathy, unspecified: Secondary | ICD-10-CM | POA: Diagnosis not present

## 2022-12-19 DIAGNOSIS — G894 Chronic pain syndrome: Secondary | ICD-10-CM | POA: Diagnosis not present

## 2022-12-20 NOTE — Progress Notes (Signed)
Inserts were never added  [name] CPed, CFo, CFm

## 2022-12-21 ENCOUNTER — Other Ambulatory Visit (HOSPITAL_BASED_OUTPATIENT_CLINIC_OR_DEPARTMENT_OTHER): Payer: Self-pay

## 2022-12-21 DIAGNOSIS — E1042 Type 1 diabetes mellitus with diabetic polyneuropathy: Secondary | ICD-10-CM | POA: Diagnosis not present

## 2022-12-21 DIAGNOSIS — M79672 Pain in left foot: Secondary | ICD-10-CM | POA: Diagnosis not present

## 2022-12-21 DIAGNOSIS — M79671 Pain in right foot: Secondary | ICD-10-CM | POA: Diagnosis not present

## 2022-12-21 DIAGNOSIS — Z79891 Long term (current) use of opiate analgesic: Secondary | ICD-10-CM | POA: Diagnosis not present

## 2022-12-21 DIAGNOSIS — G894 Chronic pain syndrome: Secondary | ICD-10-CM | POA: Diagnosis not present

## 2022-12-21 MED ORDER — OXYCODONE HCL 15 MG PO TABS
15.0000 mg | ORAL_TABLET | Freq: Four times a day (QID) | ORAL | 0 refills | Status: DC | PRN
  Filled 2022-12-26: qty 120, 30d supply, fill #0

## 2022-12-21 MED ORDER — OXYCODONE HCL 15 MG PO TABS
15.0000 mg | ORAL_TABLET | Freq: Four times a day (QID) | ORAL | 0 refills | Status: DC | PRN
  Filled 2023-01-24: qty 120, 30d supply, fill #0

## 2022-12-21 MED ORDER — NUCYNTA ER 200 MG PO TB12
200.0000 mg | ORAL_TABLET | Freq: Two times a day (BID) | ORAL | 0 refills | Status: DC
  Filled 2022-12-26: qty 60, 30d supply, fill #0

## 2022-12-21 MED ORDER — NUCYNTA ER 200 MG PO TB12
200.0000 mg | ORAL_TABLET | Freq: Two times a day (BID) | ORAL | 0 refills | Status: DC
  Filled 2023-01-24: qty 60, 30d supply, fill #0

## 2022-12-26 ENCOUNTER — Other Ambulatory Visit: Payer: Self-pay

## 2022-12-26 ENCOUNTER — Other Ambulatory Visit (HOSPITAL_BASED_OUTPATIENT_CLINIC_OR_DEPARTMENT_OTHER): Payer: Self-pay

## 2022-12-27 DIAGNOSIS — G35 Multiple sclerosis: Secondary | ICD-10-CM | POA: Diagnosis not present

## 2022-12-27 DIAGNOSIS — M14672 Charcot's joint, left ankle and foot: Secondary | ICD-10-CM | POA: Diagnosis not present

## 2022-12-27 DIAGNOSIS — M1990 Unspecified osteoarthritis, unspecified site: Secondary | ICD-10-CM | POA: Diagnosis not present

## 2022-12-27 DIAGNOSIS — Z471 Aftercare following joint replacement surgery: Secondary | ICD-10-CM | POA: Diagnosis not present

## 2022-12-27 DIAGNOSIS — E1040 Type 1 diabetes mellitus with diabetic neuropathy, unspecified: Secondary | ICD-10-CM | POA: Diagnosis not present

## 2022-12-27 DIAGNOSIS — G894 Chronic pain syndrome: Secondary | ICD-10-CM | POA: Diagnosis not present

## 2022-12-28 ENCOUNTER — Other Ambulatory Visit (HOSPITAL_BASED_OUTPATIENT_CLINIC_OR_DEPARTMENT_OTHER): Payer: Self-pay

## 2022-12-28 ENCOUNTER — Ambulatory Visit (INDEPENDENT_AMBULATORY_CARE_PROVIDER_SITE_OTHER): Payer: Medicare Other | Admitting: Podiatry

## 2022-12-28 DIAGNOSIS — L97522 Non-pressure chronic ulcer of other part of left foot with fat layer exposed: Secondary | ICD-10-CM

## 2022-12-28 DIAGNOSIS — L03116 Cellulitis of left lower limb: Secondary | ICD-10-CM

## 2022-12-28 MED ORDER — DOXYCYCLINE HYCLATE 100 MG PO CAPS
100.0000 mg | ORAL_CAPSULE | Freq: Two times a day (BID) | ORAL | 0 refills | Status: DC
  Filled 2022-12-28: qty 28, 14d supply, fill #0

## 2022-12-28 NOTE — Progress Notes (Signed)
Chief Complaint  Patient presents with   [name]    Patient here for hammertoe of second right toe    Subjective:  Patient presents today status post right second toe arthroplasty performed here in the office on 11/21/2022.  Home health nurses coming to the home and applying Betadine with light dressings daily to the right foot.  Over the past week they have noticed deterioration and wound developing to the second digit now of the left foot.  Present for further treatment evaluation  Past Medical History:  Diagnosis Date   Arthritis    Benign prostatic hypertrophy with hesitancy    Charcot's arthropathy, diabetic (HCC)    left foot   Chronic pain syndrome followed by dr mark phillps at pain clinic   secondary to MS and diabetic neuropathy   Chronic periodontal disease    CKD (chronic kidney disease), stage II    hypertensive per PCP note   Coronary artery disease hx MI  s/p  DES to [facility] 2005   hx cardiologist-- dr Riley Kill until he retired,  now followed by pcp , dr Jacky Kindle   Depression    Diabetic neuropathy [facility])    Essential tremor    Gait disorder    uses walker   History of CVA (cerebrovascular accident)    2011-  right subcortical cva--  residual left mininmal side weakness and no use of left head   History of CVA with residual deficit    2011  --  right subcortical cva w/ mininmal left hemiparesis   History of diabetic ulcer of foot    2010  left foot   History of MI (myocardial infarction)    2005   Hypertension    dr Jacky Kindle   MS (multiple sclerosis) (HCC)    dx 1985   PONV (postoperative nausea and vomiting)    S/P drug eluting coronary stent placement    2005  to midRCA   Type 1 diabetes mellitus on insulin therapy [facility])     Past Surgical History:  Procedure Laterality Date   CATARACT EXTRACTION W/ INTRAOCULAR LENS  IMPLANT, BILATERAL  Nov 2016   CORONARY ANGIOPLASTY WITH STENT PLACEMENT  10-18-2003  dr Riley Kill   DES x1  to  mRCA (high-grade lesion),  diffuse diabetic abnormalities throughout the entire coronary tree w/ diffuse calcification and segmental plaquing throughout but noncritical disease   HARDWARE REMOVAL Left 04/20/2012   Procedure: Left foot removal deep retained screw;  Surgeon: Nadara Mustard, MD;  [facility];  Service: Orthopedics;  Laterality: Left;   MULTIPLE EXTRACTIONS WITH ALVEOLOPLASTY N/A 05/15/2015   Procedure: MULTIPLE EXTRACTIONS;  Surgeon: Filbert Berthold, DDS;  Location: [facility];  Service: Dentistry;  Laterality: N/A;   ORIF ANKLE FRACTURE  12/30/2011   Procedure: OPEN REDUCTION INTERNAL FIXATION (ORIF) ANKLE FRACTURE;  Surgeon: Nadara Mustard, MD;  [facility];  Service: Orthopedics;  Laterality: Left;  Excision Bone, Abscess Left Charcot Foot, Place Antibiotic Beads, Fixation Charcot   REMOVAL SUBMANDIDULAR GLAND, BILATERAL  1970's   benign    No Known Allergies   LT foot 12/28/2022  Objective/Physical Exam Right second toe incision site is nicely healed.  Sutures are well coapted and stable.  Significant reduction of the erythema and edema to the right foot.  Overall the right foot appears to be healing nicely  Left foot demonstrates ulcer overlying the PIPJ of the second digit.  Associated erythema and edema noted.  Please see above noted photo.  After debridement of the overlying wound to the PIPJ there is exposed bone and hypertrophic head of the proximal phalanx.  It appears chronic and somewhat stable for the moment  Assessment: 1. s/p right second toe arthroplasty. DOS: 11/21/2022 2.  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  Cellulitis left forefoot  Plan of Care:  -Patient was evaluated.  - Sutures removed from the right second toe arthroplasty site.  The right foot is doing very well. -Medically necessary excisional debridement including subcutaneous tissue was performed today to the left second toe ulcer.  There is underlying exposed bone.  No  purulence.  No malodor. -Refill prescription for doxycycline 100 mg 2 times daily #28 -Home health dressing change orders were provided for the patient to give to the home health nurse to begin applying Betadine wet-to-dry dressings to the left foot -Due to the exposed bone I do believe the patient would benefit from arthroplasty of the toe.  This was very successful so far to the right foot.  We will go ahead and plan to perform this in the office next visit.  Risk benefits advantages and disadvantages were explained.  No guarantees were expressed or implied.  The patient and the daughter both understand that there is high risk of limb loss/amputation of the toe but we will pursue simple arthroplasty to see if we can resolve the wound and necrotic bone and allow for healing -Return to clinic morning of surgery  Felecia Shelling, DPM [facility]  Dr. Felecia Shelling, DPM    [address] [place], [place] [address]                Office [phone]  Fax [phone]

## 2022-12-29 ENCOUNTER — Other Ambulatory Visit (HOSPITAL_BASED_OUTPATIENT_CLINIC_OR_DEPARTMENT_OTHER): Payer: Self-pay

## 2022-12-29 DIAGNOSIS — G894 Chronic pain syndrome: Secondary | ICD-10-CM | POA: Diagnosis not present

## 2022-12-29 DIAGNOSIS — M14672 Charcot's joint, left ankle and foot: Secondary | ICD-10-CM | POA: Diagnosis not present

## 2022-12-29 DIAGNOSIS — E1040 Type 1 diabetes mellitus with diabetic neuropathy, unspecified: Secondary | ICD-10-CM | POA: Diagnosis not present

## 2022-12-29 DIAGNOSIS — M1990 Unspecified osteoarthritis, unspecified site: Secondary | ICD-10-CM | POA: Diagnosis not present

## 2022-12-29 DIAGNOSIS — G35 Multiple sclerosis: Secondary | ICD-10-CM | POA: Diagnosis not present

## 2022-12-29 DIAGNOSIS — Z471 Aftercare following joint replacement surgery: Secondary | ICD-10-CM | POA: Diagnosis not present

## 2022-12-30 ENCOUNTER — Telehealth: Payer: Self-pay | Admitting: Podiatry

## 2022-12-30 NOTE — Telephone Encounter (Signed)
Pts wife called regarding the pts 12/16 office surgery, she said that she was told that office procedures can be performed at either 7:30 and 12 and wanted to know if his 7:30 office sx can be moved to 12pm on that day or another day. She would like the time moved if possible due to some accessibility issues. There was also some questions regarding wound care from now until the surgery.The best number to reach her is [phone].

## 2023-01-02 ENCOUNTER — Telehealth: Payer: Self-pay | Admitting: Podiatry

## 2023-01-02 NOTE — Telephone Encounter (Signed)
DOS- 01/23/2023  HAMMER TOE REPAIR 2ND [id number]  [employer] SUPP. EFFECTIVE DATE- 02/07/2022  DEDUCTIBLE- $0.00 WITH REMAINING $0.00 OOP-$0.00 WITH REMAINING $0.00   SPOKE WITH MAC D. FROM [employer] AND HE STATED THAT PRIOR AUTH IS NOT REQUIRED FOR CPT CODE 01093.  CALL REF #: [id number]

## 2023-01-03 DIAGNOSIS — G894 Chronic pain syndrome: Secondary | ICD-10-CM | POA: Diagnosis not present

## 2023-01-03 DIAGNOSIS — Z471 Aftercare following joint replacement surgery: Secondary | ICD-10-CM | POA: Diagnosis not present

## 2023-01-03 DIAGNOSIS — G35 Multiple sclerosis: Secondary | ICD-10-CM | POA: Diagnosis not present

## 2023-01-03 DIAGNOSIS — M1990 Unspecified osteoarthritis, unspecified site: Secondary | ICD-10-CM | POA: Diagnosis not present

## 2023-01-03 DIAGNOSIS — E1040 Type 1 diabetes mellitus with diabetic neuropathy, unspecified: Secondary | ICD-10-CM | POA: Diagnosis not present

## 2023-01-03 DIAGNOSIS — M14672 Charcot's joint, left ankle and foot: Secondary | ICD-10-CM | POA: Diagnosis not present

## 2023-01-10 DIAGNOSIS — E1040 Type 1 diabetes mellitus with diabetic neuropathy, unspecified: Secondary | ICD-10-CM | POA: Diagnosis not present

## 2023-01-10 DIAGNOSIS — G35 Multiple sclerosis: Secondary | ICD-10-CM | POA: Diagnosis not present

## 2023-01-10 DIAGNOSIS — M14672 Charcot's joint, left ankle and foot: Secondary | ICD-10-CM | POA: Diagnosis not present

## 2023-01-10 DIAGNOSIS — Z471 Aftercare following joint replacement surgery: Secondary | ICD-10-CM | POA: Diagnosis not present

## 2023-01-10 DIAGNOSIS — G894 Chronic pain syndrome: Secondary | ICD-10-CM | POA: Diagnosis not present

## 2023-01-10 DIAGNOSIS — M1990 Unspecified osteoarthritis, unspecified site: Secondary | ICD-10-CM | POA: Diagnosis not present

## 2023-01-14 DIAGNOSIS — Z9842 Cataract extraction status, left eye: Secondary | ICD-10-CM | POA: Diagnosis not present

## 2023-01-14 DIAGNOSIS — G35 Multiple sclerosis: Secondary | ICD-10-CM | POA: Diagnosis not present

## 2023-01-14 DIAGNOSIS — N182 Chronic kidney disease, stage 2 (mild): Secondary | ICD-10-CM | POA: Diagnosis not present

## 2023-01-14 DIAGNOSIS — Z7902 Long term (current) use of antithrombotics/antiplatelets: Secondary | ICD-10-CM | POA: Diagnosis not present

## 2023-01-14 DIAGNOSIS — M1990 Unspecified osteoarthritis, unspecified site: Secondary | ICD-10-CM | POA: Diagnosis not present

## 2023-01-14 DIAGNOSIS — Z961 Presence of intraocular lens: Secondary | ICD-10-CM | POA: Diagnosis not present

## 2023-01-14 DIAGNOSIS — Z8673 Personal history of transient ischemic attack (TIA), and cerebral infarction without residual deficits: Secondary | ICD-10-CM | POA: Diagnosis not present

## 2023-01-14 DIAGNOSIS — I2510 Atherosclerotic heart disease of native coronary artery without angina pectoris: Secondary | ICD-10-CM | POA: Diagnosis not present

## 2023-01-14 DIAGNOSIS — N400 Enlarged prostate without lower urinary tract symptoms: Secondary | ICD-10-CM | POA: Diagnosis not present

## 2023-01-14 DIAGNOSIS — Z993 Dependence on wheelchair: Secondary | ICD-10-CM | POA: Diagnosis not present

## 2023-01-14 DIAGNOSIS — Z79891 Long term (current) use of opiate analgesic: Secondary | ICD-10-CM | POA: Diagnosis not present

## 2023-01-14 DIAGNOSIS — G894 Chronic pain syndrome: Secondary | ICD-10-CM | POA: Diagnosis not present

## 2023-01-14 DIAGNOSIS — Z9181 History of falling: Secondary | ICD-10-CM | POA: Diagnosis not present

## 2023-01-14 DIAGNOSIS — I252 Old myocardial infarction: Secondary | ICD-10-CM | POA: Diagnosis not present

## 2023-01-14 DIAGNOSIS — Z8631 Personal history of diabetic foot ulcer: Secondary | ICD-10-CM | POA: Diagnosis not present

## 2023-01-14 DIAGNOSIS — Z955 Presence of coronary angioplasty implant and graft: Secondary | ICD-10-CM | POA: Diagnosis not present

## 2023-01-14 DIAGNOSIS — Z96698 Presence of other orthopedic joint implants: Secondary | ICD-10-CM | POA: Diagnosis not present

## 2023-01-14 DIAGNOSIS — I129 Hypertensive chronic kidney disease with stage 1 through stage 4 chronic kidney disease, or unspecified chronic kidney disease: Secondary | ICD-10-CM | POA: Diagnosis not present

## 2023-01-14 DIAGNOSIS — F32A Depression, unspecified: Secondary | ICD-10-CM | POA: Diagnosis not present

## 2023-01-14 DIAGNOSIS — Z471 Aftercare following joint replacement surgery: Secondary | ICD-10-CM | POA: Diagnosis not present

## 2023-01-14 DIAGNOSIS — E1040 Type 1 diabetes mellitus with diabetic neuropathy, unspecified: Secondary | ICD-10-CM | POA: Diagnosis not present

## 2023-01-14 DIAGNOSIS — M14672 Charcot's joint, left ankle and foot: Secondary | ICD-10-CM | POA: Diagnosis not present

## 2023-01-16 DIAGNOSIS — G35 Multiple sclerosis: Secondary | ICD-10-CM | POA: Diagnosis not present

## 2023-01-16 DIAGNOSIS — M14672 Charcot's joint, left ankle and foot: Secondary | ICD-10-CM | POA: Diagnosis not present

## 2023-01-16 DIAGNOSIS — E1040 Type 1 diabetes mellitus with diabetic neuropathy, unspecified: Secondary | ICD-10-CM | POA: Diagnosis not present

## 2023-01-16 DIAGNOSIS — Z471 Aftercare following joint replacement surgery: Secondary | ICD-10-CM | POA: Diagnosis not present

## 2023-01-16 DIAGNOSIS — G894 Chronic pain syndrome: Secondary | ICD-10-CM | POA: Diagnosis not present

## 2023-01-16 DIAGNOSIS — M1990 Unspecified osteoarthritis, unspecified site: Secondary | ICD-10-CM | POA: Diagnosis not present

## 2023-01-19 ENCOUNTER — Other Ambulatory Visit (HOSPITAL_BASED_OUTPATIENT_CLINIC_OR_DEPARTMENT_OTHER): Payer: Self-pay

## 2023-01-20 ENCOUNTER — Other Ambulatory Visit (HOSPITAL_BASED_OUTPATIENT_CLINIC_OR_DEPARTMENT_OTHER): Payer: Self-pay

## 2023-01-23 ENCOUNTER — Ambulatory Visit: Payer: Medicare Other | Admitting: Podiatry

## 2023-01-23 DIAGNOSIS — Z471 Aftercare following joint replacement surgery: Secondary | ICD-10-CM | POA: Diagnosis not present

## 2023-01-23 DIAGNOSIS — M14672 Charcot's joint, left ankle and foot: Secondary | ICD-10-CM | POA: Diagnosis not present

## 2023-01-23 DIAGNOSIS — G894 Chronic pain syndrome: Secondary | ICD-10-CM | POA: Diagnosis not present

## 2023-01-23 DIAGNOSIS — E1040 Type 1 diabetes mellitus with diabetic neuropathy, unspecified: Secondary | ICD-10-CM | POA: Diagnosis not present

## 2023-01-23 DIAGNOSIS — G35 Multiple sclerosis: Secondary | ICD-10-CM | POA: Diagnosis not present

## 2023-01-23 DIAGNOSIS — M1990 Unspecified osteoarthritis, unspecified site: Secondary | ICD-10-CM | POA: Diagnosis not present

## 2023-01-24 ENCOUNTER — Telehealth: Payer: Self-pay | Admitting: Urology

## 2023-01-24 ENCOUNTER — Other Ambulatory Visit (HOSPITAL_BASED_OUTPATIENT_CLINIC_OR_DEPARTMENT_OTHER): Payer: Self-pay

## 2023-01-24 ENCOUNTER — Other Ambulatory Visit: Payer: Self-pay

## 2023-01-24 NOTE — Telephone Encounter (Signed)
Pts wife called stating that the home health nurse was at the house yesterday 01/23/23 changing the bandage and recommend that the wife call the office. Pt is Diabetic, having drainage, wife isn't sure if it is opened,  area is red, no swelling, no warmth coming from the red area, finished abx last week. Pt is scheduled for office sx on 01/30/23. Please Advise.

## 2023-01-26 ENCOUNTER — Telehealth: Payer: Self-pay

## 2023-01-26 DIAGNOSIS — G35 Multiple sclerosis: Secondary | ICD-10-CM | POA: Diagnosis not present

## 2023-01-26 DIAGNOSIS — E1040 Type 1 diabetes mellitus with diabetic neuropathy, unspecified: Secondary | ICD-10-CM | POA: Diagnosis not present

## 2023-01-26 DIAGNOSIS — Z471 Aftercare following joint replacement surgery: Secondary | ICD-10-CM | POA: Diagnosis not present

## 2023-01-26 DIAGNOSIS — M14672 Charcot's joint, left ankle and foot: Secondary | ICD-10-CM | POA: Diagnosis not present

## 2023-01-26 DIAGNOSIS — G894 Chronic pain syndrome: Secondary | ICD-10-CM | POA: Diagnosis not present

## 2023-01-26 DIAGNOSIS — M1990 Unspecified osteoarthritis, unspecified site: Secondary | ICD-10-CM | POA: Diagnosis not present

## 2023-01-26 NOTE — Telephone Encounter (Addendum)
[name] wife called stating that Aleatha, home health nurse is very concerned about patient's foot and would like patient to be seen before surgery on Monday. She states that patient's toes and half of foot is red. She did soak foot to get iodine off of it and after doing so noticed that all toes and half of foot are red. Would like patient to be seen tomorrow. Wife and Aleatha would like a call back today. Please advise.  [phone]  Eber Oveta Idris (Spouse)-[phone]

## 2023-01-27 ENCOUNTER — Other Ambulatory Visit (HOSPITAL_BASED_OUTPATIENT_CLINIC_OR_DEPARTMENT_OTHER): Payer: Self-pay

## 2023-01-27 ENCOUNTER — Ambulatory Visit: Payer: Medicare Other

## 2023-01-27 ENCOUNTER — Encounter: Payer: Self-pay | Admitting: Podiatry

## 2023-01-27 ENCOUNTER — Ambulatory Visit (INDEPENDENT_AMBULATORY_CARE_PROVIDER_SITE_OTHER): Payer: Medicare Other | Admitting: Podiatry

## 2023-01-27 ENCOUNTER — Ambulatory Visit (INDEPENDENT_AMBULATORY_CARE_PROVIDER_SITE_OTHER): Payer: Medicare Other

## 2023-01-27 DIAGNOSIS — M778 Other enthesopathies, not elsewhere classified: Secondary | ICD-10-CM

## 2023-01-27 DIAGNOSIS — L03116 Cellulitis of left lower limb: Secondary | ICD-10-CM

## 2023-01-27 DIAGNOSIS — L97522 Non-pressure chronic ulcer of other part of left foot with fat layer exposed: Secondary | ICD-10-CM

## 2023-01-27 MED ORDER — DOXYCYCLINE HYCLATE 100 MG PO TABS
100.0000 mg | ORAL_TABLET | Freq: Two times a day (BID) | ORAL | 0 refills | Status: DC
  Filled 2023-01-27: qty 20, 10d supply, fill #0

## 2023-01-27 NOTE — Telephone Encounter (Signed)
Called pts wife back to let her know I can get her husband scheduled at 10:15 am with Dr. Ardelle Anton to be seen. I advised pt that Dr. Ardelle Anton stated he will be sending pt to the ED. I also informed our Research officer, political party, Anabell that pts wife said she has been calling since Monday on recommendations from the Home Health Nurse regarding pts toe and foot and that nobody has returned her call, even from the phone call on Thursday, 01/26/23.

## 2023-01-27 NOTE — Telephone Encounter (Signed)
Pts wife called my phone saying her husband is scheduled for office surgery with Dr. Logan Bores on Monday here in [place]. She stated she called yesterday and spoke to an assistant/nurse letting them know that the pts toe he is scheduled to have sx on along with half of his foot was red and swollen and he is a diabetic. Wife stated she never received a call back and wanted her husband seen today to get an x-ray to see if it will change the course of the sx he is scheduled for on Monday as she is concerned he is going to loose his whole foot. Pts wife stated she has been calling since Monday when the [facility] nurse stated the toe was red with drainage but was not swelling or warm tot he touch. Pt and/or pts wife have never received a phone call back from our office this week.

## 2023-01-27 NOTE — Progress Notes (Signed)
Subjective: Chief Complaint  Patient presents with   Toe Pain    RM#11 Left toe has discharge is scheduled for surgery on second toe left foot to be done by Dr. Logan Bores and now has presented with an open sore on left big toe .    77 year old male presents the office today with his wife for concerns of infection of the left foot.  She states that home health has been changed to be observant worried about a wound on the big toe.  He is scheduled for surgery Dr. Logan Bores on Tuesday.  This has been ongoing this week. He is not on any antibiotics at this time. Denies any fevers or chills and check his temperature regularly.  Objective: AAO x3, NAD DP/PT pulses palpable bilaterally, CRT less than 3 seconds Ulceration noted to the left hallux IPJ with granulation tissue present.  Scab present on the left second toe.  There is localized edema and erythema to the forefoot with no increased temperature compared to contra extremity.  There is no fluctuation or crepitation.  There is otherwise.  The right hallux on the IPJ is a good callus which is likely the left side started.  No abrasions of the right side. No pain with calf compression, swelling, warmth, erythema  Assessment: Ulceration left foot with cellulitis  Plan: -All treatment options discussed with the patient including all alternatives, risks, complications.  -We would not able to get x-rays today as he was moving his foot. -His wife is at the tip of the hospital as possible.  Restart antibiotics and prescribed doxycycline but there is any worsening over the weekend to the emergency room or contact the office.  If needed we can try to directly admit.him if we can get an accepting provider, but hopefully he will start to improve on the oral antibiotics.  -Continue daily dressing changes -Monitor for any clinical signs or symptoms of infection and directed to call the office immediately should any occur or go to the ER. -Patient encouraged to call  the office with any questions, concerns, change in symptoms.   Follow up Monday with Dr. Logan Bores as scheduled  *Of note, the patients wife was upset as they have been calling this week and has not received a call back. She did speak with Anabell our office manager today.   Vivi Barrack DPM

## 2023-01-30 ENCOUNTER — Inpatient Hospital Stay (HOSPITAL_COMMUNITY): Payer: Medicare Other

## 2023-01-30 ENCOUNTER — Ambulatory Visit (INDEPENDENT_AMBULATORY_CARE_PROVIDER_SITE_OTHER): Payer: Medicare Other | Admitting: Podiatry

## 2023-01-30 ENCOUNTER — Inpatient Hospital Stay (HOSPITAL_COMMUNITY): Payer: Medicare Other | Admitting: Anesthesiology

## 2023-01-30 ENCOUNTER — Encounter (HOSPITAL_COMMUNITY): Payer: Self-pay | Admitting: Student

## 2023-01-30 ENCOUNTER — Encounter: Payer: BLUE CROSS/BLUE SHIELD | Admitting: Podiatry

## 2023-01-30 ENCOUNTER — Inpatient Hospital Stay: Admit: 2023-01-30 | Payer: Medicare Other | Admitting: Internal Medicine

## 2023-01-30 ENCOUNTER — Other Ambulatory Visit: Payer: Self-pay

## 2023-01-30 ENCOUNTER — Encounter (HOSPITAL_COMMUNITY): Payer: Self-pay

## 2023-01-30 ENCOUNTER — Ambulatory Visit (INDEPENDENT_AMBULATORY_CARE_PROVIDER_SITE_OTHER): Payer: Medicare Other

## 2023-01-30 ENCOUNTER — Inpatient Hospital Stay (HOSPITAL_COMMUNITY)
Admission: AD | Admit: 2023-01-30 | Discharge: 2023-02-03 | DRG: 617 | Disposition: A | Discharge status: Skilled Nursing Facility | Payer: Medicare Other | Source: Ambulatory Visit | Attending: Internal Medicine | Admitting: Internal Medicine

## 2023-01-30 ENCOUNTER — Encounter (HOSPITAL_COMMUNITY)
Admission: AD | Disposition: A | Discharge status: Skilled Nursing Facility | Payer: Self-pay | Source: Ambulatory Visit | Attending: Internal Medicine

## 2023-01-30 DIAGNOSIS — E1169 Type 2 diabetes mellitus with other specified complication: Principal | ICD-10-CM | POA: Diagnosis present

## 2023-01-30 DIAGNOSIS — F32A Depression, unspecified: Secondary | ICD-10-CM | POA: Diagnosis present

## 2023-01-30 DIAGNOSIS — R1311 Dysphagia, oral phase: Secondary | ICD-10-CM | POA: Diagnosis not present

## 2023-01-30 DIAGNOSIS — Z7409 Other reduced mobility: Secondary | ICD-10-CM | POA: Diagnosis present

## 2023-01-30 DIAGNOSIS — R262 Difficulty in walking, not elsewhere classified: Secondary | ICD-10-CM | POA: Diagnosis not present

## 2023-01-30 DIAGNOSIS — Z7401 Bed confinement status: Secondary | ICD-10-CM | POA: Diagnosis not present

## 2023-01-30 DIAGNOSIS — M19072 Primary osteoarthritis, left ankle and foot: Secondary | ICD-10-CM | POA: Diagnosis not present

## 2023-01-30 DIAGNOSIS — E11621 Type 2 diabetes mellitus with foot ulcer: Secondary | ICD-10-CM | POA: Diagnosis not present

## 2023-01-30 DIAGNOSIS — G35 Multiple sclerosis: Secondary | ICD-10-CM | POA: Diagnosis present

## 2023-01-30 DIAGNOSIS — E1165 Type 2 diabetes mellitus with hyperglycemia: Secondary | ICD-10-CM | POA: Diagnosis not present

## 2023-01-30 DIAGNOSIS — Z955 Presence of coronary angioplasty implant and graft: Secondary | ICD-10-CM

## 2023-01-30 DIAGNOSIS — I639 Cerebral infarction, unspecified: Secondary | ICD-10-CM

## 2023-01-30 DIAGNOSIS — N400 Enlarged prostate without lower urinary tract symptoms: Secondary | ICD-10-CM | POA: Diagnosis not present

## 2023-01-30 DIAGNOSIS — G894 Chronic pain syndrome: Secondary | ICD-10-CM | POA: Diagnosis present

## 2023-01-30 DIAGNOSIS — Z66 Do not resuscitate: Secondary | ICD-10-CM | POA: Diagnosis present

## 2023-01-30 DIAGNOSIS — E1140 Type 2 diabetes mellitus with diabetic neuropathy, unspecified: Secondary | ICD-10-CM | POA: Diagnosis present

## 2023-01-30 DIAGNOSIS — M869 Osteomyelitis, unspecified: Secondary | ICD-10-CM

## 2023-01-30 DIAGNOSIS — I252 Old myocardial infarction: Secondary | ICD-10-CM

## 2023-01-30 DIAGNOSIS — Z9842 Cataract extraction status, left eye: Secondary | ICD-10-CM

## 2023-01-30 DIAGNOSIS — M2142 Flat foot [pes planus] (acquired), left foot: Secondary | ICD-10-CM | POA: Diagnosis not present

## 2023-01-30 DIAGNOSIS — G4700 Insomnia, unspecified: Secondary | ICD-10-CM | POA: Diagnosis present

## 2023-01-30 DIAGNOSIS — R471 Dysarthria and anarthria: Secondary | ICD-10-CM | POA: Diagnosis not present

## 2023-01-30 DIAGNOSIS — I69354 Hemiplegia and hemiparesis following cerebral infarction affecting left non-dominant side: Secondary | ICD-10-CM | POA: Diagnosis not present

## 2023-01-30 DIAGNOSIS — M85872 Other specified disorders of bone density and structure, left ankle and foot: Secondary | ICD-10-CM | POA: Diagnosis not present

## 2023-01-30 DIAGNOSIS — M86172 Other acute osteomyelitis, left ankle and foot: Secondary | ICD-10-CM | POA: Diagnosis not present

## 2023-01-30 DIAGNOSIS — Z8379 Family history of other diseases of the digestive system: Secondary | ICD-10-CM

## 2023-01-30 DIAGNOSIS — M86672 Other chronic osteomyelitis, left ankle and foot: Secondary | ICD-10-CM | POA: Diagnosis not present

## 2023-01-30 DIAGNOSIS — Z79899 Other long term (current) drug therapy: Secondary | ICD-10-CM

## 2023-01-30 DIAGNOSIS — E785 Hyperlipidemia, unspecified: Secondary | ICD-10-CM | POA: Diagnosis present

## 2023-01-30 DIAGNOSIS — I10 Essential (primary) hypertension: Secondary | ICD-10-CM

## 2023-01-30 DIAGNOSIS — L03116 Cellulitis of left lower limb: Secondary | ICD-10-CM

## 2023-01-30 DIAGNOSIS — Z9841 Cataract extraction status, right eye: Secondary | ICD-10-CM

## 2023-01-30 DIAGNOSIS — Z9889 Other specified postprocedural states: Secondary | ICD-10-CM | POA: Diagnosis not present

## 2023-01-30 DIAGNOSIS — E1122 Type 2 diabetes mellitus with diabetic chronic kidney disease: Secondary | ICD-10-CM | POA: Diagnosis present

## 2023-01-30 DIAGNOSIS — I2510 Atherosclerotic heart disease of native coronary artery without angina pectoris: Secondary | ICD-10-CM | POA: Diagnosis not present

## 2023-01-30 DIAGNOSIS — Z794 Long term (current) use of insulin: Secondary | ICD-10-CM

## 2023-01-30 DIAGNOSIS — N189 Chronic kidney disease, unspecified: Secondary | ICD-10-CM | POA: Diagnosis not present

## 2023-01-30 DIAGNOSIS — R2689 Other abnormalities of gait and mobility: Secondary | ICD-10-CM | POA: Diagnosis not present

## 2023-01-30 DIAGNOSIS — Z7982 Long term (current) use of aspirin: Secondary | ICD-10-CM

## 2023-01-30 DIAGNOSIS — F339 Major depressive disorder, recurrent, unspecified: Secondary | ICD-10-CM | POA: Diagnosis not present

## 2023-01-30 DIAGNOSIS — R278 Other lack of coordination: Secondary | ICD-10-CM | POA: Diagnosis not present

## 2023-01-30 DIAGNOSIS — I878 Other specified disorders of veins: Secondary | ICD-10-CM | POA: Diagnosis present

## 2023-01-30 DIAGNOSIS — M86572 Other chronic hematogenous osteomyelitis, left ankle and foot: Secondary | ICD-10-CM | POA: Diagnosis not present

## 2023-01-30 DIAGNOSIS — Z87891 Personal history of nicotine dependence: Secondary | ICD-10-CM

## 2023-01-30 DIAGNOSIS — L97529 Non-pressure chronic ulcer of other part of left foot with unspecified severity: Secondary | ICD-10-CM | POA: Diagnosis present

## 2023-01-30 DIAGNOSIS — N182 Chronic kidney disease, stage 2 (mild): Secondary | ICD-10-CM | POA: Diagnosis present

## 2023-01-30 DIAGNOSIS — Z961 Presence of intraocular lens: Secondary | ICD-10-CM | POA: Diagnosis present

## 2023-01-30 DIAGNOSIS — Z7902 Long term (current) use of antithrombotics/antiplatelets: Secondary | ICD-10-CM

## 2023-01-30 DIAGNOSIS — E1142 Type 2 diabetes mellitus with diabetic polyneuropathy: Principal | ICD-10-CM

## 2023-01-30 DIAGNOSIS — L02612 Cutaneous abscess of left foot: Secondary | ICD-10-CM | POA: Diagnosis not present

## 2023-01-30 DIAGNOSIS — Z8249 Family history of ischemic heart disease and other diseases of the circulatory system: Secondary | ICD-10-CM

## 2023-01-30 DIAGNOSIS — I129 Hypertensive chronic kidney disease with stage 1 through stage 4 chronic kidney disease, or unspecified chronic kidney disease: Secondary | ICD-10-CM | POA: Diagnosis present

## 2023-01-30 DIAGNOSIS — L97522 Non-pressure chronic ulcer of other part of left foot with fat layer exposed: Secondary | ICD-10-CM

## 2023-01-30 DIAGNOSIS — M6281 Muscle weakness (generalized): Secondary | ICD-10-CM | POA: Diagnosis not present

## 2023-01-30 HISTORY — DX: Osteomyelitis, unspecified: M86.9

## 2023-01-30 HISTORY — PX: AMPUTATION TOE: SHX6595

## 2023-01-30 LAB — CBC WITH DIFFERENTIAL/PLATELET
Abs Immature Granulocytes: 0.02 10*3/uL (ref 0.00–0.07)
Basophils Absolute: 0 10*3/uL (ref 0.0–0.1)
Basophils Relative: 1 %
Eosinophils Absolute: 0.2 10*3/uL (ref 0.0–0.5)
Eosinophils Relative: 3 %
HCT: 46.8 % (ref 39.0–52.0)
Hemoglobin: 15.4 g/dL (ref 13.0–17.0)
Immature Granulocytes: 0 %
Lymphocytes Relative: 25 %
Lymphs Abs: 2.1 10*3/uL (ref 0.7–4.0)
MCH: 33.6 pg (ref 26.0–34.0)
MCHC: 32.9 g/dL (ref 30.0–36.0)
MCV: 102.2 fL — ABNORMAL HIGH (ref 80.0–100.0)
Monocytes Absolute: 0.7 10*3/uL (ref 0.1–1.0)
Monocytes Relative: 8 %
Neutro Abs: 5.3 10*3/uL (ref 1.7–7.7)
Neutrophils Relative %: 63 %
Platelets: 133 10*3/uL — ABNORMAL LOW (ref 150–400)
RBC: 4.58 MIL/uL (ref 4.22–5.81)
RDW: 13.1 % (ref 11.5–15.5)
WBC: 8.4 10*3/uL (ref 4.0–10.5)
nRBC: 0 % (ref 0.0–0.2)

## 2023-01-30 LAB — COMPREHENSIVE METABOLIC PANEL
ALT: 15 U/L (ref 0–44)
AST: 23 U/L (ref 15–41)
Albumin: 2.8 g/dL — ABNORMAL LOW (ref 3.5–5.0)
Alkaline Phosphatase: 229 U/L — ABNORMAL HIGH (ref 38–126)
Anion gap: 6 (ref 5–15)
BUN: 19 mg/dL (ref 8–23)
CO2: 31 mmol/L (ref 22–32)
Calcium: 9 mg/dL (ref 8.9–10.3)
Chloride: 101 mmol/L (ref 98–111)
Creatinine, Ser: 1.36 mg/dL — ABNORMAL HIGH (ref 0.61–1.24)
GFR, Estimated: 54 mL/min — ABNORMAL LOW (ref >60)
Glucose, Bld: 89 mg/dL (ref 70–99)
Potassium: 4.4 mmol/L (ref 3.5–5.1)
Sodium: 138 mmol/L (ref 135–145)
Total Bilirubin: 0.7 mg/dL (ref <1.2)
Total Protein: 7.7 g/dL (ref 6.5–8.1)

## 2023-01-30 LAB — GLUCOSE, CAPILLARY
Glucose-Capillary: 116 mg/dL — ABNORMAL HIGH (ref 70–99)
Glucose-Capillary: 80 mg/dL (ref 70–99)
Glucose-Capillary: 82 mg/dL (ref 70–99)
Glucose-Capillary: 87 mg/dL (ref 70–99)
Glucose-Capillary: 162 mg/dL — ABNORMAL HIGH (ref 70–99)

## 2023-01-30 SURGERY — AMPUTATION, TOE
Anesthesia: Monitor Anesthesia Care | Site: Toe | Laterality: Left

## 2023-01-30 MED ORDER — DEXTROSE-SODIUM CHLORIDE 5-0.45 % IV SOLN: INTRAVENOUS | Status: DC

## 2023-01-30 MED ORDER — FINASTERIDE 5 MG PO TABS
5.0000 mg | ORAL_TABLET | Freq: Every morning | ORAL | Status: DC
Start: 2023-01-31 — End: 2023-02-03
  Administered 2023-01-31 – 2023-02-03 (×4): 5 mg via ORAL
  Filled 2023-01-30 (×4): qty 1

## 2023-01-30 MED ORDER — FENTANYL CITRATE (PF) 100 MCG/2ML IJ SOLN: 25.0000 ug | INTRAMUSCULAR | Status: DC | PRN

## 2023-01-30 MED ORDER — METOPROLOL TARTRATE 25 MG PO TABS
25.0000 mg | ORAL_TABLET | Freq: Two times a day (BID) | ORAL | Status: DC
  Administered 2023-01-30 – 2023-02-03 (×8): 25 mg via ORAL
  Filled 2023-01-30 (×8): qty 1

## 2023-01-30 MED ORDER — OXYCODONE HCL 5 MG PO TABS
15.0000 mg | ORAL_TABLET | Freq: Four times a day (QID) | ORAL | Status: DC | PRN
  Administered 2023-01-30 – 2023-02-03 (×12): 15 mg via ORAL
  Filled 2023-01-30 (×12): qty 3

## 2023-01-30 MED ORDER — ATORVASTATIN CALCIUM 10 MG PO TABS
10.0000 mg | ORAL_TABLET | Freq: Every day | ORAL | Status: DC
  Administered 2023-01-30 – 2023-02-02 (×4): 10 mg via ORAL
  Filled 2023-01-30 (×4): qty 1

## 2023-01-30 MED ORDER — 0.9 % SODIUM CHLORIDE (POUR BTL) OPTIME
TOPICAL | Status: DC | PRN
  Administered 2023-01-30: 1000 mL

## 2023-01-30 MED ORDER — TAPENTADOL HCL ER 100 MG PO TB12
200.0000 mg | ORAL_TABLET | Freq: Once | ORAL | Status: AC
  Administered 2023-01-30: 200 mg via ORAL
  Filled 2023-01-30: qty 2

## 2023-01-30 MED ORDER — ENOXAPARIN SODIUM 40 MG/0.4ML IJ SOSY
40.0000 mg | PREFILLED_SYRINGE | INTRAMUSCULAR | Status: DC
  Administered 2023-01-30 – 2023-02-02 (×4): 40 mg via SUBCUTANEOUS
  Filled 2023-01-30 (×4): qty 0.4

## 2023-01-30 MED ORDER — INSULIN ASPART 100 UNIT/ML IJ SOLN: 0.0000 [IU] | Freq: Every day | INTRAMUSCULAR | Status: DC

## 2023-01-30 MED ORDER — LIDOCAINE HCL 1 % IJ SOLN
INTRAMUSCULAR | Status: AC
  Filled 2023-01-30: qty 20

## 2023-01-30 MED ORDER — EPHEDRINE SULFATE-NACL 50-0.9 MG/10ML-% IV SOSY
PREFILLED_SYRINGE | INTRAVENOUS | Status: DC | PRN
  Administered 2023-01-30: 10 mg via INTRAVENOUS

## 2023-01-30 MED ORDER — PROPOFOL 500 MG/50ML IV EMUL
INTRAVENOUS | Status: DC | PRN
  Administered 2023-01-30: 75 ug/kg/min via INTRAVENOUS

## 2023-01-30 MED ORDER — ACETAMINOPHEN 325 MG PO TABS: 650.0000 mg | ORAL_TABLET | Freq: Four times a day (QID) | ORAL | Status: DC | PRN

## 2023-01-30 MED ORDER — AMLODIPINE BESYLATE 5 MG PO TABS
5.0000 mg | ORAL_TABLET | Freq: Every morning | ORAL | Status: DC
  Administered 2023-01-31 – 2023-02-03 (×4): 5 mg via ORAL
  Filled 2023-01-30 (×4): qty 1

## 2023-01-30 MED ORDER — ACETAMINOPHEN 650 MG RE SUPP: 650.0000 mg | Freq: Four times a day (QID) | RECTAL | Status: DC | PRN

## 2023-01-30 MED ORDER — SENNOSIDES-DOCUSATE SODIUM 8.6-50 MG PO TABS: 1.0000 | ORAL_TABLET | Freq: Every evening | ORAL | Status: DC | PRN

## 2023-01-30 MED ORDER — DULOXETINE HCL 60 MG PO CPEP
60.0000 mg | ORAL_CAPSULE | Freq: Two times a day (BID) | ORAL | Status: DC
  Administered 2023-01-30 – 2023-02-03 (×8): 60 mg via ORAL
  Filled 2023-01-30 (×8): qty 1

## 2023-01-30 MED ORDER — CLOPIDOGREL BISULFATE 75 MG PO TABS
75.0000 mg | ORAL_TABLET | Freq: Every day | ORAL | Status: DC
  Administered 2023-01-30 – 2023-02-03 (×5): 75 mg via ORAL
  Filled 2023-01-30 (×5): qty 1

## 2023-01-30 MED ORDER — METRONIDAZOLE 500 MG/100ML IV SOLN
500.0000 mg | Freq: Two times a day (BID) | INTRAVENOUS | Status: DC
Start: 2023-01-30 — End: 2023-02-01
  Administered 2023-01-30 – 2023-02-01 (×4): 500 mg via INTRAVENOUS
  Filled 2023-01-30 (×4): qty 100

## 2023-01-30 MED ORDER — LIDOCAINE HCL (PF) 1 % IJ SOLN
INTRAMUSCULAR | Status: DC | PRN
  Administered 2023-01-30: 5 mL

## 2023-01-30 MED ORDER — SODIUM CHLORIDE 0.9 % IV SOLN
2.0000 g | Freq: Two times a day (BID) | INTRAVENOUS | Status: DC
  Administered 2023-01-30 – 2023-02-01 (×4): 2 g via INTRAVENOUS
  Filled 2023-01-30 (×5): qty 12.5

## 2023-01-30 MED ORDER — INSULIN ASPART 100 UNIT/ML IJ SOLN
0.0000 [IU] | Freq: Three times a day (TID) | INTRAMUSCULAR | Status: DC
  Administered 2023-01-31: 9 [IU] via SUBCUTANEOUS

## 2023-01-30 MED ORDER — ONDANSETRON HCL 4 MG PO TABS: 4.0000 mg | ORAL_TABLET | Freq: Four times a day (QID) | ORAL | Status: DC | PRN

## 2023-01-30 MED ORDER — VANCOMYCIN HCL 1750 MG/350ML IV SOLN
1750.0000 mg | Freq: Once | INTRAVENOUS | Status: AC
Start: 2023-01-30 — End: 2023-01-30
  Administered 2023-01-30: 1750 mg via INTRAVENOUS
  Filled 2023-01-30: qty 350

## 2023-01-30 MED ORDER — ORAL CARE MOUTH RINSE: 15.0000 mL | Freq: Once | OROMUCOSAL | Status: AC

## 2023-01-30 MED ORDER — ASPIRIN 81 MG PO TBEC
81.0000 mg | DELAYED_RELEASE_TABLET | Freq: Every day | ORAL | Status: DC
  Administered 2023-01-31 – 2023-02-03 (×4): 81 mg via ORAL
  Filled 2023-01-30 (×4): qty 1

## 2023-01-30 MED ORDER — LACTATED RINGERS IV SOLN: INTRAVENOUS | Status: DC

## 2023-01-30 MED ORDER — TRAZODONE HCL 50 MG PO TABS
150.0000 mg | ORAL_TABLET | Freq: Every day | ORAL | Status: DC
  Administered 2023-01-30 – 2023-02-02 (×4): 150 mg via ORAL
  Filled 2023-01-30 (×4): qty 1

## 2023-01-30 MED ORDER — BUPIVACAINE HCL (PF) 0.5 % IJ SOLN
INTRAMUSCULAR | Status: DC | PRN
  Administered 2023-01-30: 5 mL

## 2023-01-30 MED ORDER — BUPIVACAINE HCL (PF) 0.5 % IJ SOLN
INTRAMUSCULAR | Status: AC
  Filled 2023-01-30: qty 30

## 2023-01-30 MED ORDER — ONDANSETRON HCL 4 MG/2ML IJ SOLN
4.0000 mg | Freq: Four times a day (QID) | INTRAMUSCULAR | Status: DC | PRN
  Administered 2023-02-01: 4 mg via INTRAVENOUS
  Filled 2023-01-30: qty 2

## 2023-01-30 MED ORDER — VANCOMYCIN HCL 1.5 G IV SOLR
1500.0000 mg | INTRAVENOUS | Status: DC
  Administered 2023-01-31: 1500 mg via INTRAVENOUS
  Filled 2023-01-30: qty 30

## 2023-01-30 MED ORDER — TRIAMCINOLONE 0.1 % CREAM:EUCERIN CREAM 1:1: TOPICAL_CREAM | Freq: Two times a day (BID) | CUTANEOUS | Status: DC | PRN

## 2023-01-30 MED ORDER — POLYETHYLENE GLYCOL 3350 17 G PO PACK
17.0000 g | PACK | Freq: Every day | ORAL | Status: DC
  Administered 2023-01-30 – 2023-01-31 (×2): 17 g via ORAL
  Filled 2023-01-30 (×5): qty 1

## 2023-01-30 MED ORDER — ACETAMINOPHEN 500 MG PO TABS
1000.0000 mg | ORAL_TABLET | Freq: Once | ORAL | Status: AC
  Administered 2023-01-30: 1000 mg via ORAL
  Filled 2023-01-30: qty 2

## 2023-01-30 MED ORDER — CHLORHEXIDINE GLUCONATE 0.12 % MT SOLN
15.0000 mL | Freq: Once | OROMUCOSAL | Status: AC
  Administered 2023-01-30: 15 mL via OROMUCOSAL
  Filled 2023-01-30: qty 15

## 2023-01-30 SURGICAL SUPPLY — 34 items
BLADE OSC/SAGITTAL MD 5.5X18 (BLADE) ×1 IMPLANT
BLADE SURG 10 STRL SS (BLADE) ×1 IMPLANT
BLADE SURG 15 STRL LF DISP TIS (BLADE) ×2 IMPLANT
BNDG COHESIVE 3X5 TAN ST LF (GAUZE/BANDAGES/DRESSINGS) ×1 IMPLANT
BNDG ELASTIC 3INX 5YD STR LF (GAUZE/BANDAGES/DRESSINGS) ×1 IMPLANT
BNDG ELASTIC 4INX 5YD STR LF (GAUZE/BANDAGES/DRESSINGS) IMPLANT
BNDG ESMARK 4X9 LF (GAUZE/BANDAGES/DRESSINGS) ×1 IMPLANT
BNDG GAUZE DERMACEA FLUFF 4 (GAUZE/BANDAGES/DRESSINGS) IMPLANT
CHLORAPREP W/TINT 26 (MISCELLANEOUS) ×1 IMPLANT
DRSG XEROFORM 1X8 (GAUZE/BANDAGES/DRESSINGS) IMPLANT
ELECT REM PT RETURN 9FT ADLT (ELECTROSURGICAL) ×1
ELECTRODE REM PT RTRN 9FT ADLT (ELECTROSURGICAL) ×1 IMPLANT
GAUZE SPONGE 2X2 STRL 8-PLY (GAUZE/BANDAGES/DRESSINGS) ×2 IMPLANT
GAUZE SPONGE 4X4 12PLY STRL (GAUZE/BANDAGES/DRESSINGS) ×1 IMPLANT
GAUZE STRETCH 2X75IN STRL (MISCELLANEOUS) ×1 IMPLANT
GAUZE XEROFORM 1X8 LF (GAUZE/BANDAGES/DRESSINGS) ×1 IMPLANT
GLOVE BIO SURGEON STRL SZ7.5 (GLOVE) ×1 IMPLANT
GLOVE BIOGEL PI IND STRL 7.5 (GLOVE) ×1 IMPLANT
GOWN STRL REUS W/ TWL LRG LVL3 (GOWN DISPOSABLE) ×2 IMPLANT
KIT BASIN OR (CUSTOM PROCEDURE TRAY) ×1 IMPLANT
NDL BIOPSY JAMSHIDI 8X6 (NEEDLE) IMPLANT
NDL HYPO 25X1 1.5 SAFETY (NEEDLE) ×2 IMPLANT
NEEDLE BIOPSY JAMSHIDI 8X6 (NEEDLE) IMPLANT
NEEDLE HYPO 25X1 1.5 SAFETY (NEEDLE) ×2 IMPLANT
PACK ORTHO EXTREMITY (CUSTOM PROCEDURE TRAY) ×1 IMPLANT
PADDING CAST ABS COTTON 4X4 ST (CAST SUPPLIES) ×2 IMPLANT
SPIKE FLUID TRANSFER (MISCELLANEOUS) ×2 IMPLANT
STAPLER VISISTAT 35W (STAPLE) IMPLANT
STOCKINETTE 4X48 STRL (DRAPES) ×1 IMPLANT
SUT ETHILON 3 0 FSLX (SUTURE) ×1 IMPLANT
SUT PROLENE 4 0 PS 2 18 (SUTURE) ×2 IMPLANT
SYR CONTROL 10ML LL (SYRINGE) ×2 IMPLANT
UNDERPAD 30X36 HEAVY ABSORB (UNDERPADS AND DIAPERS) ×1 IMPLANT
WATER STERILE IRR 1000ML POUR (IV SOLUTION) ×1 IMPLANT

## 2023-01-30 NOTE — Plan of Care (Signed)
Plan of Care Note for Accepted Transfer   Patient: [name] Tisby Weist Jr.    WUJ:[id number]     Facility requesting transfer: [facility] Requesting Provider: Dr. Logan Bores  82M history of CKD stage II, CAD, diabetes, diabetic neuropathy, history of CVA, Charcot's arthropathy who has had ongoing wounds of the left great toe and left second toe.  There is concern for osteomyelitis.  Came to the office for arthroplasty with Dr. Logan Bores today, however there is significant concern for underlying osteomyelitis, and Dr. Logan Bores feels that the patient would benefit from hospital admission.  Imaging has been a challenge because the patient is unable to hold still even for an x-ray.  Dr. Logan Bores feels patient will need left foot x-ray, likely with anesthesia.  As such, patient has been accepted for admission to [facility].  Most recent vitals, labs and radiology:  There were no vitals taken for this visit.      Latest Ref Rng & Units 10/14/2022    3:14 AM 10/13/2022    5:42 AM 10/12/2022    3:32 AM  CBC  WBC 4.0 - 10.5 K/uL 7.2  9.0  9.7   Hemoglobin 13.0 - 17.0 g/dL 95.6  21.3  08.6   Hematocrit 39.0 - 52.0 % 41.4  45.2  45.1   Platelets 150 - 400 K/uL 86  93  94       Latest Ref Rng & Units 10/14/2022    3:14 AM 10/13/2022    4:02 AM 10/12/2022    3:32 AM  BMP  Glucose 70 - 99 mg/dL 578  469  629   BUN 8 - 23 mg/dL 23  21  24    Creatinine 0.61 - 1.24 mg/dL 5.28  4.13  2.44   Sodium 135 - 145 mmol/L 133  136  137   Potassium 3.5 - 5.1 mmol/L 4.8  4.8  4.0   Chloride 98 - 111 mmol/L 101  102  100   CO2 22 - 32 mmol/L 24  25  27    Calcium 8.9 - 10.3 mg/dL 8.0  8.2  8.5      Left foot x-ray 12/20: Diffuse osseous demineralization secondary to patient's age/health.  Specifically to the left great toe there does appear to be some pathologic fracture around the IPJ and erosion of the head of the proximal phalanx best visualized on medial oblique view.  Concerning for osteomyelitis. IMPRESSION:  Osteomyelitis left great toe IPJ    The patient has been accepted for transfer to [facility]. The patient will remain under the care and responsibility of the referring provider until they have arrived to our inpatient facility.  Author: Shakirra Buehler Sharlette Dense, MD  01/30/2023  Check [email or web address] for on-call coverage.  Nursing staff, Please call [facility] Admits & Consults System-Wide number on Amion as soon as patient's arrival, so appropriate admitting provider can evaluate the pt.

## 2023-01-30 NOTE — Progress Notes (Signed)
Orthopedic Tech Progress Note Patient Details:  [name] Diebel Schroader Jr. 10-02-1945 [id number]  Ortho Devices Type of Ortho Device: Postop shoe/boot Ortho Device/Splint Location: ble Ortho Device/Splint Interventions: Ordered      [name] 01/30/2023, 5:59 PM

## 2023-01-30 NOTE — Plan of Care (Signed)
  Problem: Pain Management: Goal: General experience of comfort will improve Outcome: Progressing   Problem: Safety: Goal: Ability to remain free from injury will improve Outcome: Progressing   Problem: Skin Integrity: Goal: Risk for impaired skin integrity will decrease Outcome: Progressing   Problem: Education: Goal: Ability to describe Frett-care measures that may prevent or decrease complications (Diabetes Survival Skills Education) will improve Outcome: Progressing   Problem: Coping: Goal: Level of anxiety will decrease Outcome: Not Progressing

## 2023-01-30 NOTE — Progress Notes (Signed)
Pt arrived to 6 north room 26 alert and oriented x4. Pain level 4/10 in left foot. Assisted pt from wheelchair to bed. Pt was able to pivot to bed with assist. Bed in lowest position. Call light in reach. Bed alarm on. Family at bedside.

## 2023-01-30 NOTE — Anesthesia Preprocedure Evaluation (Addendum)
Anesthesia Evaluation  Patient identified by MRN, date of birth, ID band Patient awake    Reviewed: Allergy & Precautions, NPO status , [name] Chart, lab work & pertinent test results, reviewed documented beta blocker date and time   History of Anesthesia Complications (+) PONV and history of anesthetic complications  Airway Mallampati: II  TM Distance: >3 FB Neck ROM: Full    Dental  (+) Edentulous Upper, Edentulous Lower, Dental Advisory Given   Pulmonary former smoker   Pulmonary exam normal breath sounds clear to auscultation       Cardiovascular hypertension, Pt. on medications and Pt. on home beta blockers + CAD and + Cardiac Stents  Normal cardiovascular exam Rhythm:Regular Rate:Normal     Neuro/Psych  PSYCHIATRIC DISORDERS  Depression    MS CVA (left sided weakness), Residual Symptoms    GI/Hepatic negative GI ROS, Neg liver ROS,,,  Endo/Other  diabetes, Insulin Dependent    Renal/GU Renal InsufficiencyRenal diseaseLab Results      Component                Value               Date                      NA                       138                 01/30/2023                CL                       101                 01/30/2023                K                        4.4                 01/30/2023                CO2                      31                  01/30/2023                BUN                      19                  01/30/2023                CREATININE               1.36 (H)            01/30/2023                GFRNONAA                 54 (L)              01/30/2023                CALCIUM  9.0                 01/30/2023                PHOS                     2.9                 10/14/2022                ALBUMIN                  2.8 (L)             01/30/2023                GLUCOSE                  89                  01/30/2023             negative genitourinary    Musculoskeletal  (+) Arthritis ,    Abdominal   Peds  Hematology  (+) Blood dyscrasia (plavix)   Anesthesia Other Findings   Reproductive/Obstetrics                             Anesthesia Physical Anesthesia Plan  ASA: 3  Anesthesia Plan: MAC   Post-op Pain Management: Tylenol PO (pre-op)*   Induction: Intravenous  PONV Risk Score and Plan: 2 and Propofol infusion, Treatment may vary due to age or medical condition and Ondansetron  Airway Management Planned: Natural Airway  Additional Equipment:   Intra-op Plan:   Post-operative Plan:   Informed Consent: I have reviewed the patients History and Physical, chart, labs and discussed the procedure including the risks, benefits and alternatives for the proposed anesthesia with the patient or authorized representative who has indicated his/her understanding and acceptance.   Patient has DNR.  Discussed DNR with patient and Suspend DNR.   Dental advisory given  Plan Discussed with: CRNA  Anesthesia Plan Comments:        Anesthesia Quick Evaluation

## 2023-01-30 NOTE — Progress Notes (Signed)
   01/30/23 0924  Vitals  Temp 98.2 F (36.8 C)  Temp Source Oral  BP (!) 151/58  MAP (mmHg) 87  BP Location Left Arm  BP Method Automatic  Patient Position (if appropriate) Lying  Pulse Rate 92  Pulse Rate Source Monitor  Resp 18  Level of Consciousness  Level of Consciousness Alert  MEWS COLOR  MEWS Score Color Green  Oxygen Therapy  O2 Device Room Air  Pain Assessment  Pain Scale 0-10  Pain Score 4  Pain Type Acute pain  Pain Location Foot  Pain Orientation Left  MEWS Score  MEWS Temp 0  MEWS Systolic 0  MEWS Pulse 0  MEWS RR 0  MEWS LOC 0  MEWS Score 0

## 2023-01-30 NOTE — Progress Notes (Signed)
Pharmacy Antibiotic Note  [name] Stvil Lourenco Montez Hageman. is a 77 y.o. male admitted on 01/30/2023 with  diabetic foot .  Pharmacy has been consulted for Cefepime, Vancomycin, Metronidazole dosing.  Plan: Cefepime 2 grams iv Q 12 hours Vancomycin 1750 mg iv x 1 then 1500 mg iv Q 24 hours (AUC of 508 using Scr 1.36) Metronidazole 500 mg iv Q 12 hours  Follow Scr, cultures, plan  Height: 6' (182.9 cm) Weight: 87.1 kg (192 lb 0.3 oz) IBW/kg (Calculated) : 77.6  Temp (24hrs), Avg:98.2 F (36.8 C), Min:98.2 F (36.8 C), Max:98.2 F (36.8 C)  Recent Labs  Lab 01/30/23 1334  WBC 8.4  CREATININE 1.36*    Estimated Creatinine Clearance: 51.5 mL/min (A) (by C-G formula based on SCr of 1.36 mg/dL (H)).    No Known Allergies  Thank you for allowing pharmacy to be a part of this patient's care. Okey Regal, PharmD 01/30/2023 2:46 PM

## 2023-01-30 NOTE — Progress Notes (Signed)
No chief complaint on file.   Subjective:  Patient presents this morning with his daughter for an office procedure for arthroplasty of the second digit of the left foot. Over the past week the patient has developed an ulcer overlying the IPJ of the left great toe.  Active bleeding this morning.  He was seen here in our office on Friday, 01/27/2023, and antibiotic doxycycline 100 mg BID x 10 days prescribed.  On Saturday, 01/28/2023 the daughter actually had to call 911 for uncontrollable bleeding to the left great toe.  They redressed the foot and he presents this morning to our office  Past Medical History:  Diagnosis Date   Arthritis    Benign prostatic hypertrophy with hesitancy    Charcot's arthropathy, diabetic (HCC)    left foot   Chronic pain syndrome followed by dr mark phillps at pain clinic   secondary to MS and diabetic neuropathy   Chronic periodontal disease    CKD (chronic kidney disease), stage II    hypertensive per PCP note   Coronary artery disease hx MI  s/p  DES to [facility] 2005   hx cardiologist-- dr Riley Kill until he retired,  now followed by pcp , dr Jacky Kindle   Depression    Diabetic neuropathy [facility])    Essential tremor    Gait disorder    uses walker   History of CVA (cerebrovascular accident)    2011-  right subcortical cva--  residual left mininmal side weakness and no use of left head   History of CVA with residual deficit    2011  --  right subcortical cva w/ mininmal left hemiparesis   History of diabetic ulcer of foot    2010  left foot   History of MI (myocardial infarction)    2005   Hypertension    dr Jacky Kindle   MS (multiple sclerosis) (HCC)    dx 1985   PONV (postoperative nausea and vomiting)    S/P drug eluting coronary stent placement    2005  to midRCA   Type 1 diabetes mellitus on insulin therapy [facility])     Past Surgical History:  Procedure Laterality Date   CATARACT EXTRACTION W/ INTRAOCULAR LENS  IMPLANT, BILATERAL  Nov 2016    CORONARY ANGIOPLASTY WITH STENT PLACEMENT  10-18-2003  dr Riley Kill   DES x1  to  mRCA (high-grade lesion), diffuse diabetic abnormalities throughout the entire coronary tree w/ diffuse calcification and segmental plaquing throughout but noncritical disease   HARDWARE REMOVAL Left 04/20/2012   Procedure: Left foot removal deep retained screw;  Surgeon: Nadara Mustard, MD;  Location: [facility] OR;  Service: Orthopedics;  Laterality: Left;   MULTIPLE EXTRACTIONS WITH ALVEOLOPLASTY N/A 05/15/2015   Procedure: MULTIPLE EXTRACTIONS;  Surgeon: Filbert Berthold, DDS;  Location: [facility];  Service: Dentistry;  Laterality: N/A;   ORIF ANKLE FRACTURE  12/30/2011   Procedure: OPEN REDUCTION INTERNAL FIXATION (ORIF) ANKLE FRACTURE;  Surgeon: Nadara Mustard, MD;  Location: [facility] OR;  Service: Orthopedics;  Laterality: Left;  Excision Bone, Abscess Left Charcot Foot, Place Antibiotic Beads, Fixation Charcot   REMOVAL SUBMANDIDULAR GLAND, BILATERAL  1970's   benign    No Known Allergies   LT foot 01/30/2023 AM  Objective/Physical Exam Increased erythema and edema noted encompassing the left foot compared to the contralateral limb.  Active bleeding noted from the IPJ of the left great toe.  No purulence onset about 1 week. The left second toe actually has dry stable well  adhered eschar to the PIPJ.   Radiographic exam LT foot 01/26/1929 2024 Diffuse osseous demineralization secondary to patient's age/health.  Specifically to the left great toe there does appear to be some pathologic fracture around the IPJ and erosion of the head of the proximal phalanx best visualized on medial oblique view.  Concerning for osteomyelitis. IMPRESSION: Osteomyelitis left great toe IPJ  Assessment: 1. s/p right second toe arthroplasty. DOS: 11/21/2022; healed 2.  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  New onset of ulcer to the left great toe with active bleeding 4.  Cellulitis left forefoot   Plan of Care:  -Patient  was evaluated.  X-rays reviewed -Given the patient's multiple comorbidities and poor health with onset of cellulitis to the foot and active osteomyelitis to the great toe with active bleeding 3+ days, recommend admission to the hospital for full workup and evaluation and to be started on IV antibiotics.  Patient will need amputation of the left great toe at minimum.  Recommend MRI if possible. [facility] admit line was contacted this morning by me personally for bed placement and admission through the hospitalist service.  Greatly appreciated -Patient was prescribed doxycycline 100 mg BID x 10 days on 01/27/2023.  Continue until admitted - Right second toe has healed nicely.  There is no open wound or erythema or edema to the right foot. -Dressings changed to the left foot.  At the moment of the left second toe is stable.  There is no active bleeding and a well adhered eschar to the second toe.  Decision made to postpone the arthroplasty until the cellulitis resolves and the left great toe is healed -Please reach out to podiatrist on-call for inpatient consult   Felecia Shelling, DPM [facility]  Dr. Felecia Shelling, DPM    [address] [place], [place] [address]                Office [phone]  Fax [phone]

## 2023-01-30 NOTE — Op Note (Signed)
Full Operative Report  Date of Operation: 3:50 PM, 01/30/2023   Patient: [name]. - 77 y.o. male  Surgeon: Pilar Plate, DPM   Assistant: None  Diagnosis: Osteomyelitis of left great toe  Procedure:  1. Amputation of left great toe at MPJ level    Anesthesia: Monitor Anesthesia Care  No responsible provider has been recorded for the case.  Anesthesiologist: Elmer Picker, MD CRNA: Sandie Ano, CRNA   Estimated Blood Loss: Minimal  Hemostasis: 1) Anatomical dissection, mechanical compression, electrocautery 2) No tourniquet was used  Implants: * No implants in log *  Materials: Minimal  Injectables: 1) Pre-operatively: 10 cc of 50:50 mixture 1%lidocaine plain and 0.5% marcaine plain 2) Post-operatively: None   Specimens: Pathology: Left great toe for path Microbiology: Bone for culture left hallux proximal phalanx head   Antibiotics: IV antibiotics given per schedule on the floor  Drains: None  Complications: Patient tolerated the procedure well without complication.   Operative findings: As below in detailed report  Indications for Procedure: [name] M Statz Jr. presents to Pilar Plate, DPM with a chief complaint of draining ulceration to the left great toe. Concern for underlying osteomyelitis on XR. The patient has failed conservative treatments of various modalities. At this time the patient has elected to proceed with surgical correction. All alternatives, risks, and complications of the procedures were thoroughly explained to the patient. Patient exhibits appropriate understanding of all discussion points and informed consent was signed and obtained in the chart with no guarantees to surgical outcome given or implied.  Description of Procedure: Patient was brought to the operating room. Patient remained on their hospital bed in the supine position. A surgical timeout was performed and all members of the operating room,  the procedure, and the surgical site were identified. anesthesia occurred as per anesthesia record. Local anesthetic as previously described was then injected about the operative field in a local infiltrative block.  The operative lower extremity as noted above was then prepped and draped in the usual sterile manner. The following procedure then began.  Attention was directed to the 1st digit on the left foot. A full-thickness incision encompassing the entire digit was made using a #15 blade. Dissection was carried down to bone. The toe was secured with a towel clamp, further dissected in its entirety, and disarticulated at the MPJ and passed to the back table as a gross specimen. This was then labled and sent to pathology. The bone was noted to be soft and eroded, and consistent with osteomyelitis. All remaining necrotic and devitalized soft tissue structures were visualized and dissected away using sharp and dull dissection. Care was taken to protect all neurovascular structures throughout the dissection. All bleeders were cauterized as necessary. A deep tissue culture was obtained at this time as well as a bone sample taken from the left hallux proximal phalanx. The area was then flushed with copious amounts of sterile saline. Then using the suture materials previously described, the site was closed in anatomic layers and the skin was well approximated under minimal tension.  The surgical site was then dressed with xeroform 4x4 kerlix ace. The patient tolerated both the procedure and anesthesia well with vital signs stable throughout. The patient was transferred in good condition and all vital signs stable  from the OR to recovery under the discretion of anesthesia.  Condition: Vital signs stable, neurovascular status unchanged from preoperative   Surgical plan:  Clean margin at left hallux amp site. Was  purulence noted from wound at dorsal hallux IPJ. WBAT in post op shoe bilateral. Left 2nd toe has a  small superficial ulceration though no exposed bone and clinically appears healthy. At high risk for amputation of the toe. Rec 7-10 days po abx on DC. Follow up with dr. Logan Bores next week.   The patient will be WBAT in a post op shoe to the operative limb until further instructed. The dressing is to remain clean, dry, and intact. Will continue to follow unless noted elsewhere.   [name], DPM [facility]

## 2023-01-30 NOTE — H&P (Signed)
History and Physical    Patient: [name] Przywara Barrick Jr. ZOX:[id number] DOB: 31-Oct-1945 DOA: 01/30/2023 DOS: the patient was seen and examined on 01/30/2023 PCP: Geoffry Paradise, MD  Patient coming from: Home  Chief Complaint: Diabetic foot ulcer  HPI: [name] Hageman. is a 77 y.o. male with medical history significant of BPH, CKD stage II, depression, CVA, T2DM, diabetic foot ulcer, diabetic neuropathy and Charcot's arthropathy who was directly admitted by his podiatrist due to concern for osteomyelitis of his left great toe. Patient is being followed by podiatry and was scheduled for an arthroplasty by Dr. Logan Bores at the office today. He had developed an ulcer overlying the IPJ of the left great toe.  He was seen on 12/20 and prescribed doxycycline 100 mg twice daily for 10 days.  On 12/21, patient had uncontrollable bleeding from the left great toe.  His foot was wrapped and he presented to the podiatry office this morning.  He continued to have active bleeding from the ulcer and Dr. Logan Bores was concerned for underlying osteomyelitis so he directly admitted patient to the hospital.  X-ray of the left foot on 01/27/2023 showed osteomyelitis of the left great toe IPJ   Review of Systems: As mentioned in the history of present illness. All other systems reviewed and are negative. Past Medical History:  Diagnosis Date   Arthritis    Benign prostatic hypertrophy with hesitancy    Charcot's arthropathy, diabetic (HCC)    left foot   Chronic pain syndrome followed by dr mark phillps at pain clinic   secondary to MS and diabetic neuropathy   Chronic periodontal disease    CKD (chronic kidney disease), stage II    hypertensive per PCP note   Coronary artery disease hx MI  s/p  DES to [facility] 2005   hx cardiologist-- dr Riley Kill until he retired,  now followed by pcp , dr Jacky Kindle   Depression    Diabetic neuropathy [facility])    Essential tremor    Gait disorder    uses walker   History of CVA  (cerebrovascular accident)    2011-  right subcortical cva--  residual left mininmal side weakness and no use of left head   History of CVA with residual deficit    2011  --  right subcortical cva w/ mininmal left hemiparesis   History of diabetic ulcer of foot    2010  left foot   History of MI (myocardial infarction)    2005   Hypertension    dr Jacky Kindle   MS (multiple sclerosis) (HCC)    dx 1985   PONV (postoperative nausea and vomiting)    S/P drug eluting coronary stent placement    2005  to midRCA   Type 1 diabetes mellitus on insulin therapy [facility])    Past Surgical History:  Procedure Laterality Date   CATARACT EXTRACTION W/ INTRAOCULAR LENS  IMPLANT, BILATERAL  Nov 2016   CORONARY ANGIOPLASTY WITH STENT PLACEMENT  10-18-2003  dr Riley Kill   DES x1  to  mRCA (high-grade lesion), diffuse diabetic abnormalities throughout the entire coronary tree w/ diffuse calcification and segmental plaquing throughout but noncritical disease   HARDWARE REMOVAL Left 04/20/2012   Procedure: Left foot removal deep retained screw;  Surgeon: Nadara Mustard, MD;  Location: [facility] OR;  Service: Orthopedics;  Laterality: Left;   MULTIPLE EXTRACTIONS WITH ALVEOLOPLASTY N/A 05/15/2015   Procedure: MULTIPLE EXTRACTIONS;  Surgeon: Filbert Berthold, DDS;  Location: [facility] [place];  Service: Dentistry;  Laterality: N/A;   ORIF ANKLE FRACTURE  12/30/2011   Procedure: OPEN REDUCTION INTERNAL FIXATION (ORIF) ANKLE FRACTURE;  Surgeon: Nadara Mustard, MD;  Location: [facility] OR;  Service: Orthopedics;  Laterality: Left;  Excision Bone, Abscess Left Charcot Foot, Place Antibiotic Beads, Fixation Charcot   REMOVAL SUBMANDIDULAR GLAND, BILATERAL  1970's   benign   Social History:  reports that he quit smoking about 10 years ago. His smoking use included cigars and cigarettes. He started smoking about 36 years ago. He has a 13 pack-year smoking history. He has never used smokeless tobacco. He reports that he does not drink  alcohol and does not use drugs.  No Known Allergies  Family History  Problem Relation Age of Onset   Tremor Mother    Heart disease Father    Celiac disease Brother    Cancer - Prostate Paternal Grandfather     Prior to Admission medications   Medication Sig Start Date End Date Taking? Authorizing Provider  amLODipine (NORVASC) 5 MG tablet Take 5 mg by mouth every morning.    Yes [provider]  aspirin 81 MG tablet Take 81 mg by mouth daily.   Yes [provider]  atorvastatin (LIPITOR) 10 MG tablet Take 10 mg by mouth at bedtime.   Yes [provider]  clopidogrel (PLAVIX) 75 MG tablet Take 75 mg by mouth daily.   Yes [provider]  doxycycline (VIBRA-TABS) 100 MG tablet Take 1 tablet (100 mg total) by mouth 2 (two) times daily. 01/27/23  Yes Vivi Barrack, DPM  DULoxetine (CYMBALTA) 60 MG capsule Take 60 mg by mouth 2 (two) times daily.   Yes [provider]  finasteride (PROSCAR) 5 MG tablet Take 5 mg by mouth every morning.    Yes [provider]  Insulin NPH, Human,, Isophane, (HUMULIN N KWIKPEN) 100 UNIT/ML Kiwkpen Inject 15-34 Units into the skin 2 (two) times daily. Inject 34 units in the morning and Inject 15 units in the evening.   Yes [provider]  metoprolol tartrate (LOPRESSOR) 25 MG tablet Take 25 mg by mouth 2 (two) times daily.   Yes [provider]  oxyCODONE (ROXICODONE) 15 MG immediate release tablet Take 1 tablet (15 mg total) by mouth every 6 (six) hours as needed for pain Patient taking differently: Take 15 mg by mouth every 6 (six) hours. 01/20/23  Yes   Polyethylene Glycol 3350 (PEG 3350) 17 GM/SCOOP POWD Take 17 g by mouth 2 (two) times daily. Patient taking differently: Take 17 g by mouth at bedtime. 10/14/22  Yes [name], [name] Latif, DO  Tapentadol HCl (NUCYNTA ER) 200 MG TB12 Take 200 mg by mouth every 12 (twelve) hours. 01/20/23  Yes   traZODone (DESYREL) 150 MG tablet Take 150  mg by mouth at bedtime.   Yes [provider]  bisacodyl (DULCOLAX) 5 MG EC tablet Take 2 tablets (10 mg total) by mouth daily as needed for moderate constipation. Patient not taking: Reported on 01/30/2023 10/14/22   Marguerita Merles Latif, DO  nitroGLYCERIN (NITRO-DUR) 0.4 mg/hr patch Apply patch to the white flesh of the toe daily. Patient not taking: Reported on 01/30/2023 07/04/17   Helane Gunther, DPM  nystatin cream-hydrocortisone cream-zinc oxide Apply topically 3 (three) times daily. Patient not taking: Reported on 01/30/2023 10/14/22   Marguerita Merles Latif, DO  ondansetron (ZOFRAN) 4 MG tablet Take 1 tablet (4 mg total) by mouth every 6 (six) hours as needed for nausea. Patient not taking: Reported on  01/30/2023 10/14/22   Marguerita Merles Latif, DO  senna-docusate (SENOKOT-S) 8.6-50 MG tablet Take 1 tablet by mouth 2 (two) times daily. Patient not taking: Reported on 01/30/2023 10/14/22   Merlene Laughter, DO    Physical Exam: Vitals:   01/30/23 0924 01/30/23 1100  BP: (!) 151/58   Pulse: 92   Resp: 18   Temp: 98.2 F (36.8 C)   TempSrc: Oral   Weight:  87.1 kg  Height:  6' (1.829 m)   General: Pleasant, well-appearing elderly man laying in bed. No acute distress. HEENT: /AT. Anicteric sclera.  CV: RRR. No murmurs, rubs, or gallops. 1+ pedal edema. Pulmonary: Lungs CTAB. Normal effort. No wheezing or rales. Abdominal: Soft, nontender, nondistended. Normal bowel sounds. Extremities: Moderate erythema and edema to the left foot. Left big toe wrapped with bandage. Skin: Warm and dry. Cellulitis of the left distal foot Neuro: A&Ox3. Moves all extremities. Normal sensation to light touch. No focal deficit. Psych: Normal mood and affect  Data Reviewed: Labs show CBG 116, WBC 8.4, Hgb 15.4, platelet 133, sodium 138, K+ 4.4, creatinine 1.36, AST/ALT 23/15, alk phos 229, bilirubin 0.7,  X-ray of the left foot on 01/27/2023 showed osteomyelitis of the left great toe  IPJ  Assessment and Plan: Marquet M Bedore [name] Hageman. is a 77 y.o. male with medical history significant of BPH, CKD stage II, depression, CVA, T2DM, diabetic foot ulcer, diabetic neuropathy and Charcot's arthropathy who was directly admitted by his podiatrist due to concern for osteomyelitis of his left great toe.   # Osteomyelitis of left great toe # Diabetic foot ulcer Patient with history of diabetes complicated by diabetic foot ulcers admitted directly by his podiatrist due to worsening ulcer and evidence of osteomyelitis on recent imaging. Patient now s/p amputation of the left great toe at the MPJ level. -Podiatry following, recs below -Recommend WBAT in post op shoe bilateral -7 to 10 days of p.o. antibiotics at discharge -Follow-up with Dr. Logan Bores next week  -Continue IV vancomycin, cefepime and Flagyl, de-escalate as able -Resume home oxycodone 15 mg every 6 hours as needed for pain -Follow-up blood culture, surgical pathology, fungal, acid-fast, wound aerobic and anaerobic cultures -Trend CBC, fever curve   # T2DM, well-controlled A1c 5.8% 3 months ago. Has a CGM in place. His CBGs have been slightly low in the 80s after surgery. -IV D5 1/2NS at 100 cc/h for 10 hours -SSI with meals, CBG monitoring -Follow-up repeat A1c  # HTN BP elevated with SBP in the 140s to 170s. -Continue home metoprolol and amlodipine  # HLD # Hx of CVA -Continue aspirin, Plavix and atorvastatin  # BPH -Continue finasteride  # Depression -Continue on duloxetine  # Insomnia -Continue trazodone at bedtime   Advance Care Planning:   Code Status: Limited: Do not attempt resuscitation (DNR) -DNR-LIMITED -Do Not Intubate/DNI    Consults: Podiatry  Family Communication: No family at bedside  Severity of Illness: The appropriate patient status for this patient is INPATIENT. Inpatient status is judged to be reasonable and necessary in order to provide the required intensity of service to ensure the  patient's safety. The patient's presenting symptoms, physical exam findings, and initial radiographic and laboratory data in the context of their chronic comorbidities is felt to place them at high risk for further clinical deterioration. Furthermore, it is not anticipated that the patient will be medically stable for discharge from the hospital within 2 midnights of admission.   * I certify that at the point of  admission it is my clinical judgment that the patient will require inpatient hospital care spanning beyond 2 midnights from the point of admission due to high intensity of service, high risk for further deterioration and high frequency of surveillance required.*  Author: Steffanie Rainwater, MD 01/30/2023 2:39 PM  For on call review [email or web address].

## 2023-01-30 NOTE — Progress Notes (Signed)
[name] Amponsah,MD aware that pt had arrived to 6 north room 26

## 2023-01-30 NOTE — Transfer of Care (Signed)
Immediate Anesthesia Transfer of Care Note  Patient: Audra Mcsweeney Fernholz Jr.  Procedure(s) Performed: AMPUTATION TOE (Left: Toe)  Patient Location: PACU  Anesthesia Type:MAC  Level of Consciousness: awake, alert , and oriented  Airway & Oxygen Therapy: Patient Spontanous Breathing and Patient connected to nasal cannula oxygen  Post-op Assessment: Report given to RN and Post -op Vital signs reviewed and stable  Post vital signs: Reviewed and stable  Last Vitals:  Vitals Value Taken Time  BP 131/56 01/30/23 1632  Temp 36.4 C 01/30/23 1632  Pulse 56 01/30/23 1640  Resp 12 01/30/23 1640  SpO2 93 % 01/30/23 1640  Vitals shown include unfiled device data.  Last Pain:  Vitals:   01/30/23 1632  TempSrc:   PainSc: 0-No pain      Patients Stated Pain Goal: 3 (01/30/23 1523)  Complications: No notable events documented.

## 2023-01-30 NOTE — Consult Note (Signed)
PODIATRY CONSULTATION  NAME [name]. MRN [id number] DOB 02/08/1946 DOA 01/30/2023   Reason for consult: Left great toe ulcer  Attending/Consulting physician: P. Amponsah MD  History of present illness: 77 y.o. male with history DM with neuropathy send to Hospital by Dr. Logan Bores. Direct admit this AM. Was planned to have in office procedure on Left 2nd toe this AM but there was concern for wound to the left hallux driaining purulence. XR taken and concern for OM of left hallux IPJ. He related it got worse quickly. Also has a wound on the right hallux but denies drainage or bleeding.   Past Medical History:  Diagnosis Date   Arthritis    Benign prostatic hypertrophy with hesitancy    Charcot's arthropathy, diabetic (HCC)    left foot   Chronic pain syndrome followed by dr mark phillps at pain clinic   secondary to MS and diabetic neuropathy   Chronic periodontal disease    CKD (chronic kidney disease), stage II    hypertensive per PCP note   Coronary artery disease hx MI  s/p  DES to [facility] 2005   hx cardiologist-- dr Riley Kill until he retired,  now followed by pcp , dr Jacky Kindle   Depression    Diabetic neuropathy [facility])    Essential tremor    Gait disorder    uses walker   History of CVA (cerebrovascular accident)    2011-  right subcortical cva--  residual left mininmal side weakness and no use of left head   History of CVA with residual deficit    2011  --  right subcortical cva w/ mininmal left hemiparesis   History of diabetic ulcer of foot    2010  left foot   History of MI (myocardial infarction)    2005   Hypertension    dr Jacky Kindle   MS (multiple sclerosis) [facility])    dx 1985   PONV (postoperative nausea and vomiting)    S/P drug eluting coronary stent placement    2005  to midRCA   Type 1 diabetes mellitus on insulin therapy (HCC)        Latest Ref Rng & Units 10/14/2022    3:14 AM 10/13/2022    5:42 AM 10/12/2022    3:32 AM  CBC  WBC 4.0 - 10.5 K/uL 7.2  9.0   9.7   Hemoglobin 13.0 - 17.0 g/dL 32.2  02.5  42.7   Hematocrit 39.0 - 52.0 % 41.4  45.2  45.1   Platelets 150 - 400 K/uL 86  93  94        Latest Ref Rng & Units 10/14/2022    3:14 AM 10/13/2022    4:02 AM 10/12/2022    3:32 AM  BMP  Glucose 70 - 99 mg/dL 062  376  283   BUN 8 - 23 mg/dL 23  21  24    Creatinine 0.61 - 1.24 mg/dL 1.51  7.61  6.07   Sodium 135 - 145 mmol/L 133  136  137   Potassium 3.5 - 5.1 mmol/L 4.8  4.8  4.0   Chloride 98 - 111 mmol/L 101  102  100   CO2 22 - 32 mmol/L 24  25  27    Calcium 8.9 - 10.3 mg/dL 8.0  8.2  8.5       Physical Exam: Lower Extremity Exam Vasc: R - PT 1/4 palpable, DP palpable. Cap refill < 3 sec to digits  L - PT 1/4  palpable, DP palpable. Cap refill <3 sec to digits  Derm: R - Eschar dorsal right hallux IPJ without significant erythema or edema of the toe.  L - Ulcer of left hallux IPJ dorsally draining purulent fluid. Erytheam and edema of the toe.  MSK:  R -  pes planus, HT deformities  L - pes planus, HT deformities   Neuro: R - Gross sensation absent to toes. Gross motor function intact   L - Gross sensation absent to toes. Gross motor function intact    ASSESSMENT/PLAN OF CARE 77 y.o. male with PMHx significant for  DM2 with neuropathy with Left hallux ulcer with concern for underlying osteomyelitis as seen on XR.   WBC: p XR L foot: erosions at the head of the proximal phalanx.   - NPO for OR this afternoon for left great toe amputation. Patient agreeable to proceed.    - Reviewed ABI results from Aug 2024 - Left ABI [place] vessels, left TBI normal. Will assess bleeding intra op and determine if vascular consultation warranted.  - Continue IV abx broad spectrum pending further culture data - Anticoagulation: Hold pending OR - Wound care: Betadine paint and non adherent to right great toe. Left hallux non required pre op - WB status: WBAT bilateral in post op shoes - will order post op - Will continue to follow   Thank you  for the consult.  Please contact me directly with any questions or concerns.           Corinna Gab, DPM [facility] / [facility]    [address] [place], [place] [address]                Office [phone]  Fax [phone]

## 2023-01-31 ENCOUNTER — Encounter (HOSPITAL_COMMUNITY): Payer: Self-pay | Admitting: Podiatry

## 2023-01-31 DIAGNOSIS — M869 Osteomyelitis, unspecified: Secondary | ICD-10-CM | POA: Diagnosis not present

## 2023-01-31 LAB — BASIC METABOLIC PANEL
Anion gap: 8 (ref 5–15)
BUN: 21 mg/dL (ref 8–23)
CO2: 25 mmol/L (ref 22–32)
Calcium: 8 mg/dL — ABNORMAL LOW (ref 8.9–10.3)
Chloride: 99 mmol/L (ref 98–111)
Creatinine, Ser: 1.41 mg/dL — ABNORMAL HIGH (ref 0.61–1.24)
GFR, Estimated: 52 mL/min — ABNORMAL LOW (ref >60)
Glucose, Bld: 364 mg/dL — ABNORMAL HIGH (ref 70–99)
Potassium: 4.9 mmol/L (ref 3.5–5.1)
Sodium: 132 mmol/L — ABNORMAL LOW (ref 135–145)

## 2023-01-31 LAB — GLUCOSE, CAPILLARY
Glucose-Capillary: 354 mg/dL — ABNORMAL HIGH (ref 70–99)
Glucose-Capillary: 406 mg/dL — ABNORMAL HIGH (ref 70–99)
Glucose-Capillary: 343 mg/dL — ABNORMAL HIGH (ref 70–99)
Glucose-Capillary: 275 mg/dL — ABNORMAL HIGH (ref 70–99)
Glucose-Capillary: 298 mg/dL — ABNORMAL HIGH (ref 70–99)

## 2023-01-31 LAB — CBC
HCT: 41.6 % (ref 39.0–52.0)
Hemoglobin: 14.4 g/dL (ref 13.0–17.0)
MCH: 34.4 pg — ABNORMAL HIGH (ref 26.0–34.0)
MCHC: 34.6 g/dL (ref 30.0–36.0)
MCV: 99.3 fL (ref 80.0–100.0)
Platelets: 100 K/uL — ABNORMAL LOW (ref 150–400)
RBC: 4.19 MIL/uL — ABNORMAL LOW (ref 4.22–5.81)
RDW: 13 % (ref 11.5–15.5)
WBC: 8.1 K/uL (ref 4.0–10.5)
nRBC: 0 % (ref 0.0–0.2)

## 2023-01-31 MED ORDER — INSULIN NPH (HUMAN) (ISOPHANE) 100 UNIT/ML ~~LOC~~ SUSP
15.0000 [IU] | Freq: Every day | SUBCUTANEOUS | Status: DC
  Filled 2023-01-31: qty 10

## 2023-01-31 MED ORDER — INSULIN ASPART 100 UNIT/ML IJ SOLN
0.0000 [IU] | Freq: Every day | INTRAMUSCULAR | Status: DC
  Administered 2023-01-31: 3 [IU] via SUBCUTANEOUS

## 2023-01-31 MED ORDER — INSULIN ASPART 100 UNIT/ML IJ SOLN
0.0000 [IU] | Freq: Three times a day (TID) | INTRAMUSCULAR | Status: DC
  Administered 2023-01-31: 7 [IU] via SUBCUTANEOUS
  Administered 2023-01-31: 5 [IU] via SUBCUTANEOUS
  Administered 2023-02-01 (×2): 1 [IU] via SUBCUTANEOUS
  Administered 2023-02-01 – 2023-02-02 (×2): 2 [IU] via SUBCUTANEOUS
  Administered 2023-02-02: 1 [IU] via SUBCUTANEOUS
  Administered 2023-02-03: 2 [IU] via SUBCUTANEOUS

## 2023-01-31 MED ORDER — INSULIN NPH (HUMAN) (ISOPHANE) 100 UNIT/ML ~~LOC~~ SUSP
34.0000 [IU] | Freq: Every day | SUBCUTANEOUS | Status: DC
  Administered 2023-01-31: 34 [IU] via SUBCUTANEOUS
  Filled 2023-01-31: qty 10

## 2023-01-31 MED ORDER — INSULIN ISOPHANE HUMAN 100 UNIT/ML KWIKPEN: 25.0000 [IU] | PEN_INJECTOR | Freq: Two times a day (BID) | SUBCUTANEOUS | Status: DC

## 2023-01-31 MED ORDER — INSULIN GLARGINE-YFGN 100 UNIT/ML ~~LOC~~ SOLN
25.0000 [IU] | Freq: Every day | SUBCUTANEOUS | Status: DC
Start: 2023-01-31 — End: 2023-02-01
  Administered 2023-01-31: 25 [IU] via SUBCUTANEOUS
  Filled 2023-01-31 (×2): qty 0.25

## 2023-01-31 NOTE — Anesthesia Postprocedure Evaluation (Signed)
Anesthesia Post Note  Patient: [name] Tagliaferri Latona Jr.  Procedure(s) Performed: AMPUTATION TOE (Left: Toe)     Patient location during evaluation: PACU Anesthesia Type: MAC Level of consciousness: awake and alert Pain management: pain level controlled Vital Signs Assessment: post-procedure vital signs reviewed and stable Respiratory status: spontaneous breathing, nonlabored ventilation, respiratory function stable and patient connected to nasal cannula oxygen Cardiovascular status: stable and blood pressure returned to baseline Postop Assessment: no apparent nausea or vomiting Anesthetic complications: no   No notable events documented.  Last Vitals:  Vitals:   01/31/23 0533 01/31/23 0853  BP: (!) 151/85 (!) 134/50  Pulse: 77 66  Resp: 20 18  Temp: 36.6 C 36.8 C  SpO2: 93% 96%    Last Pain:  Vitals:   01/31/23 0853  TempSrc: Oral  PainSc:                  Khrystian Schauf

## 2023-01-31 NOTE — Progress Notes (Signed)
PROGRESS NOTE  [name] M Langland Jr.  DOB: 1945-08-25  PCP: Geoffry Paradise, MD ZOX:[id number]  DOA: 01/30/2023  LOS: 1 day  Hospital Day: 2  Brief narrative: [name] Manis. is a 77 y.o. male with PMH significant for DM2, HTN, HLD, CVA, CAD, CKD, diabetic neuropathy, diabetic foot ulcer, charcoaled's arthropathy BPH, depression 12/23, patient was admitted from podiatry service for concern of osteomyelitis of left great toe.   Patient follows up with podiatry for left great toe wound.  He was scheduled for an arthroplasty by Dr. Logan Bores at the office on 12/23. However, over the past week, patient developed an ulcer on the left great toe. He was seen in the office in 12/20 left foot x-ray showed osteomyelitis of the left great toe IPJ.  He was started on a course of doxycycline 100 mg twice daily for 10 days.    12/21, patient started to have active bleeding from the ulcer, seen at podiatrist office again on 12/23. Elective procedure was canceled and patient was sent to the hospital for inpatient management.  In the ED, patient is afebrile, heart rate in 50s and 60s, blood pressure in 150s, breathing on room air Labs with WC count normal, hemoglobin normal, MCV 102, BN/creatinine 19/1.36 Admitted to [facility] Podiatry consulted 12/23, patient underwent amputation of left great toe at MPJ level.  Subjective: Patient was seen and examined this morning. Patient elderly Caucasian male.  Lying on bed.  Not in distress.  Chart reviewed.  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 Annamary Rummage Per operative note, clear margin at her left hallux amputation site.  Currently on broad-spectrum IV antibiotics.  Plan for 7 to 10 days of p.o. antibiotics at discharge. Per podiatry  recommendation, recommend WBAT on postop shoe bilateral.  Dressing to remain clean dry and intact. To follow-up with podiatry Dr. Logan Bores as an outpatient next week Continue pain control with oxycodone as before  Type 2 diabetes mellitus A1c 5.8 on 10/11/2022 PTA meds-insulin NPH and Premeal Humalog. Hyperglycemic since last night as he missed NPH yesterday.  1 dose of NPH was given this morning.  He was a possible discharge today to home.  Updated plan is for SNF discharge. Will switch him to basal and bolus regimen with Semglee and SSI/Accu-Cheks. Start with 25 units of Semglee tonight Recent Labs  Lab 01/30/23 1714 01/30/23 2101 01/31/23 0616 01/31/23 0848 01/31/23 1054  GLUCAP 82 162* 354* 406* 343*   HTN Continue home metoprolol and amlodipine   H/o CVA, HLD Continue aspirin, Plavix and atorvastatin   BPH Continue finasteride   Depression Continue on duloxetine   Insomnia Continue trazodone at bedtime  Impaired mobility I discussed with patient's wife this afternoon.  He has MS and diabetic neuropathy.  Over the years, his mobility has impaired.  She states he has fallen so many times at home.  She has trying to help him and keep him home for as an as she could but she states she is not able to handle his level of care at this time.  She is expecting a discharge to SNF.  PT eval pending.   Mobility: PT/OT eval ordered  Goals of care   Code Status: Limited: Do not attempt resuscitation (DNR) -DNR-LIMITED -Do Not Intubate/DNI      DVT  prophylaxis:  enoxaparin (LOVENOX) injection 40 mg Start: 01/30/23 2000   Antimicrobials: Broad-spectrum IV antibiotic coverage with cefepime, Flagyl and vancomycin Fluid: None Consultants: Podiatry Family Communication: Discussed with patient's wife  Status: Inpatient Level of care:  Med-Surg   Patient is from: Home Needs to continue in-hospital care: Pending PT eval.  Family expecting SNF discharge.   Diet:  Diet Order              Diet Carb Modified Fluid consistency: Thin  Diet effective now                   Scheduled Meds:  amLODipine  5 mg Oral q morning   aspirin EC  81 mg Oral Daily   atorvastatin  10 mg Oral QHS   clopidogrel  75 mg Oral Daily   DULoxetine  60 mg Oral BID   enoxaparin (LOVENOX) injection  40 mg Subcutaneous Q24H   finasteride  5 mg Oral q morning   insulin aspart  0-5 Units Subcutaneous QHS   insulin aspart  0-9 Units Subcutaneous TID WC   insulin glargine-yfgn  25 Units Subcutaneous QHS   metoprolol tartrate  25 mg Oral BID   polyethylene glycol  17 g Oral QHS   traZODone  150 mg Oral QHS    PRN meds: acetaminophen **OR** acetaminophen, ondansetron **OR** ondansetron (ZOFRAN) IV, oxyCODONE, senna-docusate, triamcinolone 0.1 % cream : eucerin   Infusions:   ceFEPime (MAXIPIME) IV 2 g (01/31/23 0527)   metronidazole 500 mg (01/31/23 0529)   vancomycin      Antimicrobials: Anti-infectives (From admission, onward)    Start     Dose/Rate Route Frequency Ordered Stop   01/31/23 1600  Vancomycin (VANCOCIN) 1,500 mg in sodium chloride 0.9 % 500 mL IVPB        1,500 mg 250 mL/hr over 120 Minutes Intravenous Every 24 hours 01/30/23 1446     01/30/23 1600  metroNIDAZOLE (FLAGYL) IVPB 500 mg        500 mg 100 mL/hr over 60 Minutes Intravenous Every 12 hours 01/30/23 1439     01/30/23 1600  ceFEPIme (MAXIPIME) 2 g in sodium chloride 0.9 % 100 mL IVPB        2 g 200 mL/hr over 30 Minutes Intravenous Every 12 hours 01/30/23 1441     01/30/23 1530  vancomycin (VANCOREADY) IVPB 1750 mg/350 mL        1,750 mg 175 mL/hr over 120 Minutes Intravenous  Once 01/30/23 1442 01/30/23 1621       Objective: Vitals:   01/31/23 0853 01/31/23 1158  BP: (!) 134/50 131/81  Pulse: 66 67  Resp: 18 18  Temp: 98.3 F (36.8 C) 97.8 F (36.6 C)  SpO2: 96% 94%    Intake/Output Summary (Last 24 hours) at 01/31/2023 1324 Last data filed at 01/31/2023 0551 Gross per 24 hour  Intake  1180.73 ml  Output 1880 ml  Net -699.27 ml   Filed Weights   01/30/23 1100  Weight: 87.1 kg   Weight change:  Body mass index is 26.04 kg/m.   Physical Exam: General exam: Pleasant, elderly Caucasian male.  Not in distress Skin: No rashes, lesions or ulcers. HEENT: Atraumatic, normocephalic, no obvious bleeding Lungs: Clear to auscultation bilaterally,  CVS: S1, S2, no murmur,   GI/Abd: Soft, nontender, nondistended, bowel sound present,   CNS: Alert, awake, oriented to place and person Psychiatry: Mood appropriate,  Extremities: No pedal edema, no calf tenderness, left foot in bandage.  Data Review: I have personally reviewed the laboratory data and studies available.  F/u labs  Unresulted Labs (From admission, onward)     Start     Ordered   01/30/23 1618  Acid Fast Culture with reflexed sensitivities  RELEASE UPON ORDERING,   TIMED       Comments: Specimen A:  Phone [phone] Immunocompromised?  No  Antibiotic Treatment:  none Is the patient on airborne/droplet precautions? No Clinical History:  osteomyelitis left great toe Special Instructions:  none Specimen Disposition:  OR Specimen Holding     01/30/23 1618   01/30/23 1618  Acid Fast Culture with reflexed sensitivities  RELEASE UPON ORDERING,   TIMED       Comments: Specimen B:  Phone [phone] Immunocompromised?  No  Antibiotic Treatment:  none Is the patient on airborne/droplet precautions? No Clinical History:  osteomyelitis left great toe Special Instructions:  none Specimen Disposition:  OR Specimen Holding     01/30/23 1618   01/30/23 1618  Acid Fast Smear (AFB)  RELEASE UPON ORDERING,   TIMED       Comments: Specimen A:  Phone [phone] Immunocompromised?  No  Antibiotic Treatment:  none Is the patient on airborne/droplet precautions? No Clinical History:  osteomyelitis left great toe Special Instructions:  none Specimen Disposition:  OR Specimen Holding     01/30/23 1618   01/30/23 1618   Acid Fast Smear (AFB)  RELEASE UPON ORDERING,   TIMED       Comments: Specimen B:  Phone [phone] Immunocompromised?  No  Antibiotic Treatment:  none Is the patient on airborne/droplet precautions? No Clinical History:  osteomyelitis left great toe Special Instructions:  none Specimen Disposition:  OR Specimen Holding     01/30/23 1618   01/30/23 1618  Fungus Culture With Stain  RELEASE UPON ORDERING,   TIMED       Comments: Specimen A:  Phone [phone] Immunocompromised?  No  Antibiotic Treatment:  none Is the patient on airborne/droplet precautions? No Clinical History:  osteomyelitis left great toe Special Instructions:  none Specimen Disposition:  OR Specimen Holding     01/30/23 1618   01/30/23 1618  Fungus Culture With Stain  RELEASE UPON ORDERING,   TIMED       Comments: Specimen B:  Phone [phone] Immunocompromised?  No  Antibiotic Treatment:  none Is the patient on airborne/droplet precautions? No Clinical History:  osteomyelitis left great toe Special Instructions:  none Specimen Disposition:  OR Specimen Holding     01/30/23 1618            Total time spent in review of labs and imaging, patient evaluation, formulation of plan, documentation and communication with family: 55 minutes  Signed, Lorin Glass, MD [facility] 01/31/2023

## 2023-01-31 NOTE — TOC CM/SW Note (Addendum)
Transition of Care [facility]) - Inpatient Brief Assessment   Patient Details  Name: [name] Sthilaire Steffler Jr. MRN: [id number] Date of Birth: Jul 05, 1945  Transition of Care [facility]) CM/SW Contact:    Gala Lewandowsky, RN Phone Number: 01/31/2023, 12:06 PM   Clinical Narrative: Patient presented for amputation of left great toe. Case Manager was able to speak with spouse and she states the patient was not ambulating prior to hospitalization. Case Manager spoke with MD and he has placed a consult for PT/OT for recommendations. Patient had [facility] in the home previously; so if the plan is for home please call [facility]. No further needs identified at this time.    1429 01-31-23 PT/OT consulted- recommendations to follow for SNF. Case Manager called patient's spouse to make her aware that the patient is refusing. Spouse to call the Case Manager back with updates.   Transition of Care Asessment: Insurance and Status: Insurance coverage has been reviewed Patient has primary care physician: Yes Home environment has been reviewed: reviewed Prior level of function:: per spouse patient was not ambulating prior to arrival Prior/Current Home Services: No current home services Social Drivers of Health Review: SDOH reviewed no interventions necessary Readmission risk has been reviewed: Yes Transition of care needs: transition of care needs identified, TOC will continue to follow

## 2023-01-31 NOTE — Plan of Care (Signed)

## 2023-01-31 NOTE — Evaluation (Signed)
Physical Therapy Evaluation  Patient Details Name: [name] Clarkson Kakar Jr. MRN: [id number] DOB: [birth year] Today's Date: 01/31/2023  History of Present Illness  Pt is a 77 y/o male who presents 01/30/2023 from podiatrist's office for L great toe wound. 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 He is WBAT in the post-op shoe. 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   Clinical Impression  Pt admitted with above diagnosis. Pt currently with functional limitations due to the deficits listed below (see PT Problem List). At the time of PT eval pt was able to perform transfers with up to mod assist. Pt refused RW use but feel this would provide a safer transfer. Noted post-op shoes were slipping on the floor at attempts to stand. Wife called into room during eval and spoke with PT. Wife reports she was having a hard time physically assisting pt PTA and feels she cannot provide the level of physical assist he will need post-op. Post-acute rehab <3 hours/day would be reasonable to maximize functional independence, safety, and facilitate return home with family support. Pt will benefit from acute skilled PT to increase their independence and safety with mobility to allow discharge.           If plan is discharge home, recommend the following: Two people to help with walking and/or transfers;A lot of help with bathing/dressing/bathroom;Assistance with cooking/housework;Assist for transportation;Help with stairs or ramp for entrance   Can travel by private vehicle   No    Equipment Recommendations Other (comment) (TBD by next venue of care)  Recommendations for Other Services       Functional Status Assessment Patient has had  a recent decline in their functional status and demonstrates the ability to make significant improvements in function in a reasonable and predictable amount of time.     Precautions / Restrictions Precautions Precautions: Fall Required Braces or Orthoses: Other Brace Other Brace: Post-op shoe present in room for B feet Restrictions Weight Bearing Restrictions Per Provider Order: Yes LLE Weight Bearing Per Provider Order: Weight bearing as tolerated Other Position/Activity Restrictions: In post-op shoe.      Mobility  Bed Mobility Overal bed mobility: Needs Assistance Bed Mobility: Supine to Sit, Sit to Supine     Supine to sit: Supervision Sit to supine: Supervision   General bed mobility comments: Increased time and effort but pt able to complete without assistance.    Transfers Overall transfer level: Needs assistance Equipment used: 1 person hand held assist Transfers: Sit to/from Stand, Bed to chair/wheelchair/BSC Sit to Stand: Min assist Stand pivot transfers: Mod assist         General transfer comment: Pt refusing to use the walker. Instead wanting to hold to therapist for support. Post-op shoes slipping as pt attempting to power up to full stand. Up to mod assist for transition bed<>chair. Very flexed trunk throughout.    Ambulation/Gait               General Gait Details: Did not progress to gait training at this time.  Stairs            Wheelchair Mobility     Tilt Bed    Modified Rankin (Stroke Patients Only)  Balance Overall balance assessment: Needs assistance Sitting-balance support: Feet supported, No upper extremity supported Sitting balance-Leahy Scale: Poor Sitting balance - Comments: Very flexed   Standing balance support: During functional activity, Bilateral upper extremity supported, Reliant on assistive device for balance Standing balance-Leahy Scale: Poor Standing balance comment: Reliant on therapist or arm rests  of the chair.                             Pertinent Vitals/Pain Pain Assessment Pain Assessment: 0-10 Pain Score: 6  Pain Location: LLE Pain Descriptors / Indicators: Operative site guarding Pain Intervention(s): Limited activity within patient's tolerance, Monitored during session, Repositioned    Home Living Family/patient expects to be discharged to:: Private residence Living Arrangements: Spouse/significant other Available Help at Discharge: Family;Available 24 hours/day Type of Home: House Home Access: Ramped entrance       Home Layout: One level Home Equipment: Hand held shower head;Electric scooter;Wheelchair Financial trader (4 wheels);Wheelchair - power (Retail buyer)      Prior Function Prior Level of Function : Needs assist             Mobility Comments: Pt can walk 8-10' at a time with rollator. Uses wheelchair or scooter for longer distances. Falls at home PTA ADLs Comments: Wife assist with sponge bath, pt does not get in the shower anymore. Wife sets out clothes for him and pt states he can dress himself.     Extremity/Trunk Assessment   Upper Extremity Assessment Upper Extremity Assessment: Overall WFL for tasks assessed    Lower Extremity Assessment Lower Extremity Assessment: RLE deficits/detail;LLE deficits/detail RLE Deficits / Details: Bilaterally, numb in feet and up through lower 3rd of leg. Fair strength with MMT (4/5 grossly) but functional strength lower when attempting to get up.    Cervical / Trunk Assessment Cervical / Trunk Assessment: Kyphotic  Communication   Communication Communication: No apparent difficulties Cueing Techniques: Verbal cues;Gestural cues  Cognition Arousal: Alert Behavior During Therapy: WFL for tasks assessed/performed Overall Cognitive Status: Impaired/Different from baseline Area of Impairment: Safety/judgement                         Safety/Judgement: Decreased awareness of safety,  Decreased awareness of deficits              General Comments      Exercises     Assessment/Plan    PT Assessment Patient needs continued PT services  PT Problem List Decreased strength;Decreased activity tolerance;Decreased balance;Decreased mobility;Decreased knowledge of use of DME;Decreased safety awareness;Decreased knowledge of precautions;Pain;Impaired sensation       PT Treatment Interventions DME instruction;Gait training;Stair training;Functional mobility training;Therapeutic activities;Therapeutic exercise;Balance training;Patient/family education    PT Goals (Current goals can be found in the Care Plan section)  Acute Rehab PT Goals Patient Stated Goal: Wants to go home, not to SNF. Wife reports she cannot physically assist him at home. PT Goal Formulation: With patient/family Time For Goal Achievement: 02/14/23 Potential to Achieve Goals: Good    Frequency Min 1X/week     Co-evaluation               AM-PAC PT "6 Clicks" Mobility  Outcome Measure Help needed turning from your back to your side while in a flat bed without using bedrails?: A Little Help needed moving from lying on your back to sitting on the side of a flat bed without using bedrails?: A Little Help needed  moving to and from a bed to a chair (including a wheelchair)?: A Lot Help needed standing up from a chair using your arms (e.g., wheelchair or bedside chair)?: A Lot Help needed to walk in hospital room?: Total Help needed climbing 3-5 steps with a railing? : Total 6 Click Score: 12    End of Session   Activity Tolerance: Patient tolerated treatment well Patient left: in bed;with call bell/phone within reach;with bed alarm set Nurse Communication: Mobility status PT Visit Diagnosis: Unsteadiness on feet (R26.81);Difficulty in walking, not elsewhere classified (R26.2)    Time: 1610-9604 PT Time Calculation (min) (ACUTE ONLY): 43 min   Charges:   PT Evaluation $PT Eval Moderate  Complexity: 1 Mod PT Treatments $Gait Training: 23-37 mins PT General Charges $$ ACUTE PT VISIT: 1 Visit         [name], PT, DPT Acute Rehabilitation Services Secure Chat Preferred Office: [phone]   [name] 01/31/2023, 2:59 PM

## 2023-02-01 DIAGNOSIS — M869 Osteomyelitis, unspecified: Secondary | ICD-10-CM | POA: Diagnosis not present

## 2023-02-01 LAB — CBC WITH DIFFERENTIAL/PLATELET
Abs Immature Granulocytes: 0.02 K/uL (ref 0.00–0.07)
Basophils Absolute: 0 K/uL (ref 0.0–0.1)
Basophils Relative: 1 %
Eosinophils Absolute: 0.4 K/uL (ref 0.0–0.5)
Eosinophils Relative: 5 %
HCT: 37.6 % — ABNORMAL LOW (ref 39.0–52.0)
Hemoglobin: 13 g/dL (ref 13.0–17.0)
Immature Granulocytes: 0 %
Lymphocytes Relative: 22 %
Lymphs Abs: 1.6 K/uL (ref 0.7–4.0)
MCH: 34.1 pg — ABNORMAL HIGH (ref 26.0–34.0)
MCHC: 34.6 g/dL (ref 30.0–36.0)
MCV: 98.7 fL (ref 80.0–100.0)
Monocytes Absolute: 0.7 K/uL (ref 0.1–1.0)
Monocytes Relative: 10 %
Neutro Abs: 4.6 K/uL (ref 1.7–7.7)
Neutrophils Relative %: 62 %
Platelets: 90 K/uL — ABNORMAL LOW (ref 150–400)
RBC: 3.81 MIL/uL — ABNORMAL LOW (ref 4.22–5.81)
RDW: 13 % (ref 11.5–15.5)
WBC: 7.4 K/uL (ref 4.0–10.5)
nRBC: 0 % (ref 0.0–0.2)

## 2023-02-01 LAB — GLUCOSE, CAPILLARY
Glucose-Capillary: 160 mg/dL — ABNORMAL HIGH (ref 70–99)
Glucose-Capillary: 140 mg/dL — ABNORMAL HIGH (ref 70–99)
Glucose-Capillary: 141 mg/dL — ABNORMAL HIGH (ref 70–99)
Glucose-Capillary: 181 mg/dL — ABNORMAL HIGH (ref 70–99)

## 2023-02-01 LAB — BASIC METABOLIC PANEL WITH GFR
Anion gap: 7 (ref 5–15)
BUN: 24 mg/dL — ABNORMAL HIGH (ref 8–23)
CO2: 25 mmol/L (ref 22–32)
Calcium: 8.2 mg/dL — ABNORMAL LOW (ref 8.9–10.3)
Chloride: 104 mmol/L (ref 98–111)
Creatinine, Ser: 1.4 mg/dL — ABNORMAL HIGH (ref 0.61–1.24)
GFR, Estimated: 52 mL/min — ABNORMAL LOW
Glucose, Bld: 159 mg/dL — ABNORMAL HIGH (ref 70–99)
Potassium: 4.2 mmol/L (ref 3.5–5.1)
Sodium: 136 mmol/L (ref 135–145)

## 2023-02-01 MED ORDER — DOXYCYCLINE HYCLATE 100 MG PO TABS
100.0000 mg | ORAL_TABLET | Freq: Two times a day (BID) | ORAL | Status: DC
  Administered 2023-02-01: 100 mg via ORAL
  Filled 2023-02-01: qty 1

## 2023-02-01 MED ORDER — AMOXICILLIN-POT CLAVULANATE 875-125 MG PO TABS
1.0000 | ORAL_TABLET | Freq: Two times a day (BID) | ORAL | Status: DC
  Administered 2023-02-01 – 2023-02-03 (×5): 1 via ORAL
  Filled 2023-02-01 (×5): qty 1

## 2023-02-01 MED ORDER — TAPENTADOL HCL ER 100 MG PO TB12
200.0000 mg | ORAL_TABLET | Freq: Two times a day (BID) | ORAL | Status: DC
  Administered 2023-02-01 – 2023-02-03 (×5): 200 mg via ORAL
  Filled 2023-02-01 (×5): qty 2

## 2023-02-01 MED ORDER — INSULIN GLARGINE-YFGN 100 UNIT/ML ~~LOC~~ SOLN
30.0000 [IU] | Freq: Every day | SUBCUTANEOUS | Status: DC
  Administered 2023-02-01 – 2023-02-02 (×2): 30 [IU] via SUBCUTANEOUS
  Filled 2023-02-01 (×3): qty 0.3

## 2023-02-01 NOTE — TOC Progression Note (Addendum)
Transition of Care (TOC) - Progression Note    Patient Details  Name: [name] Dunkley Keleher Jr. MRN: [id number] Date of Birth: 1945-09-01  Transition of Care [facility]) CM/SW Contact  Marliss Coots, LCSW Phone Number: 02/01/2023, 12:43 PM  Clinical Narrative:     12:43 PM CSW introduced herself and role to patient at bedside. Nurse Tech was present at patient's bedside assisting him back into bed. Patient consented CSW to speak in front of NT. Patient confirmed interest in SNF for discharge. Patient consented CSW to send referrals to SNFs in [facility] (expressed interest in facilities closest to [facility] and [facility]F).  Expected Discharge Plan: Skilled Nursing Facility Barriers to Discharge: Continued Medical Work up, SNF Pending bed offer  Expected Discharge Plan and Services In-house Referral: Clinical Social Work     Living arrangements for the past 2 months: Single Family Home                                       Social Determinants of Health (SDOH) Interventions SDOH Screenings   Food Insecurity: No Food Insecurity (01/30/2023)  Housing: Low Risk  (01/30/2023)  Transportation Needs: No Transportation Needs (01/30/2023)  Utilities: Not At Risk (01/30/2023)  Tobacco Use: Medium Risk (01/30/2023)    Readmission Risk Interventions     No data to display

## 2023-02-01 NOTE — Progress Notes (Addendum)
PROGRESS NOTE  [name] M Faxon Jr.  DOB: 1945-03-14  PCP: Geoffry Paradise, MD HWE:[id number]  DOA: 01/30/2023  LOS: 2 days  Hospital Day: 3  Brief narrative: [name] Manis. is a 77 y.o. male with PMH significant for DM2, HTN, HLD, CVA, CAD, CKD, diabetic neuropathy, diabetic foot ulcer, charcoaled's arthropathy BPH, depression 12/23, patient was admitted from podiatry service for concern of osteomyelitis of left great toe.   Patient follows up with podiatry for left great toe wound.  He was scheduled for an arthroplasty by Dr. Logan Bores at the office on 12/23. However, over the past week, patient developed an ulcer on the left great toe. He was seen in the office in 12/20 left foot x-ray showed osteomyelitis of the left great toe IPJ.  He was started on a course of doxycycline 100 mg twice daily for 10 days.    12/21, patient started to have active bleeding from the ulcer, seen at podiatrist office again on 12/23. Elective procedure was canceled and patient was sent to the hospital for inpatient management.  In the ED, patient is afebrile, heart rate in 50s and 60s, blood pressure in 150s, breathing on room air Labs with WC count normal, hemoglobin normal, MCV 102, BN/creatinine 19/1.36 Admitted to [facility] Podiatry consulted 12/23, patient underwent amputation of left great toe at MPJ level.  Subjective: Patient was seen and examined this morning. Lying down in bed.  Not in distress.  Asking for his homemade Nucynta. He has finally agreed to go to SNF. Podiatry follow-up this morning appreciated.  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 Annamary Rummage Per operative note, clear margin at her left hallux amputation site.  Initially treated with broad-spectrum antibiotics.  I have switched him to Augmentin for now. Per podiatry recommendation, recommend WBAT on postop shoe bilateral.  Dressing to remain clean  dry and intact. To follow-up with podiatry Dr. Logan Bores as an outpatient next week 1/2 or 1/3. Continue pain control with oxycodone and Nucynta as before.  Type 2 diabetes mellitus A1c 5.8 on 10/11/2022 PTA meds-insulin NPH and Premeal Humalog. Currently on basal bolus regimen with Semglee and SSI with Accu-Cheks. Given Semglee 25 units last night.  Blood sugar trend as below.  Will increase Semglee to 30 units for tonight.  Continue SSI Recent Labs  Lab 01/31/23 0848 01/31/23 1054 01/31/23 1708 01/31/23 2002 02/01/23 0752  GLUCAP 406* 343* 275* 298* 160*   HTN Continue home metoprolol and amlodipine   H/o CVA, HLD Continue aspirin, Plavix and atorvastatin   BPH Continue finasteride   Depression Continue on duloxetine   Insomnia Continue trazodone at bedtime  Impaired mobility I discussed with patient's wife this afternoon.  He has MS and diabetic neuropathy.  Over the years, his mobility has impaired.  She states he has fallen so many times at home.  She has trying to help him and keep him home for as an as she could but she states she is not able to handle his level of care at this time.  She is expecting a discharge to SNF.  PT eval obtained.  SNF recommended   Goals of care   Code Status: Limited: Do not attempt resuscitation (DNR) -DNR-LIMITED -Do Not Intubate/DNI     DVT prophylaxis:  enoxaparin (LOVENOX) injection 40 mg Start: 01/30/23 2000   Antimicrobials: Switched to oral Augmentin this morning. Fluid: None Consultants: Podiatry Family Communication: Discussed with patient's  wife  Status: Inpatient Level of care:  Med-Surg   Patient is from: Home Needs to continue in-hospital care: Pending SNF   Diet:  Diet Order             Diet Carb Modified Fluid consistency: Thin  Diet effective now                   Scheduled Meds:  amLODipine  5 mg Oral q morning   amoxicillin-clavulanate  1 tablet Oral Q12H   aspirin EC  81 mg Oral Daily    atorvastatin  10 mg Oral QHS   clopidogrel  75 mg Oral Daily   DULoxetine  60 mg Oral BID   enoxaparin (LOVENOX) injection  40 mg Subcutaneous Q24H   finasteride  5 mg Oral q morning   insulin aspart  0-5 Units Subcutaneous QHS   insulin aspart  0-9 Units Subcutaneous TID WC   insulin glargine-yfgn  30 Units Subcutaneous QHS   metoprolol tartrate  25 mg Oral BID   polyethylene glycol  17 g Oral QHS   tapentadol  200 mg Oral Q12H   traZODone  150 mg Oral QHS    PRN meds: acetaminophen **OR** acetaminophen, ondansetron **OR** ondansetron (ZOFRAN) IV, oxyCODONE, senna-docusate, triamcinolone 0.1 % cream : eucerin   Infusions:     Antimicrobials: Anti-infectives (From admission, onward)    Start     Dose/Rate Route Frequency Ordered Stop   02/01/23 1045  amoxicillin-clavulanate (AUGMENTIN) 875-125 MG per tablet 1 tablet        1 tablet Oral Every 12 hours 02/01/23 0959     02/01/23 1000  doxycycline (VIBRA-TABS) tablet 100 mg  Status:  Discontinued        100 mg Oral Every 12 hours 02/01/23 0752 02/01/23 0959   01/31/23 1600  Vancomycin (VANCOCIN) 1,500 mg in sodium chloride 0.9 % 500 mL IVPB  Status:  Discontinued        1,500 mg 250 mL/hr over 120 Minutes Intravenous Every 24 hours 01/30/23 1446 02/01/23 0752   01/30/23 1600  metroNIDAZOLE (FLAGYL) IVPB 500 mg  Status:  Discontinued        500 mg 100 mL/hr over 60 Minutes Intravenous Every 12 hours 01/30/23 1439 02/01/23 0752   01/30/23 1600  ceFEPIme (MAXIPIME) 2 g in sodium chloride 0.9 % 100 mL IVPB  Status:  Discontinued        2 g 200 mL/hr over 30 Minutes Intravenous Every 12 hours 01/30/23 1441 02/01/23 0752   01/30/23 1530  vancomycin (VANCOREADY) IVPB 1750 mg/350 mL        1,750 mg 175 mL/hr over 120 Minutes Intravenous  Once 01/30/23 1442 01/30/23 1621       Objective: Vitals:   02/01/23 0416 02/01/23 0700  BP: 112/60 (!) 136/123  Pulse: (!) 58   Resp:  17  Temp: 98.3 F (36.8 C) 98.5 F (36.9 C)  SpO2:  94% 94%    Intake/Output Summary (Last 24 hours) at 02/01/2023 1001 Last data filed at 02/01/2023 0600 Gross per 24 hour  Intake 900.68 ml  Output 1250 ml  Net -349.32 ml   Filed Weights   01/30/23 1100  Weight: 87.1 kg   Weight change:  Body mass index is 26.04 kg/m.   Physical Exam: General exam: Pleasant, elderly Caucasian male.  Not in distress Skin: No rashes, lesions or ulcers. HEENT: Atraumatic, normocephalic, no obvious bleeding Lungs: Clear to auscultation bilaterally,  CVS: S1, S2, no murmur,  GI/Abd: Soft, nontender, nondistended, bowel sound present,   CNS: Alert, awake, oriented to place and person Psychiatry: Upset that he has to go to SNF and cannot return home yet Extremities: No pedal edema, no calf tenderness, left foot in bandage.  Data Review: I have personally reviewed the laboratory data and studies available.  F/u labs  Unresulted Labs (From admission, onward)     Start     Ordered   01/30/23 1618  Acid Fast Culture with reflexed sensitivities  RELEASE UPON ORDERING,   TIMED       Comments: Specimen A:  Phone [phone] Immunocompromised?  No  Antibiotic Treatment:  none Is the patient on airborne/droplet precautions? No Clinical History:  osteomyelitis left great toe Special Instructions:  none Specimen Disposition:  OR Specimen Holding     01/30/23 1618   01/30/23 1618  Acid Fast Culture with reflexed sensitivities  RELEASE UPON ORDERING,   TIMED       Comments: Specimen B:  Phone [phone] Immunocompromised?  No  Antibiotic Treatment:  none Is the patient on airborne/droplet precautions? No Clinical History:  osteomyelitis left great toe Special Instructions:  none Specimen Disposition:  OR Specimen Holding     01/30/23 1618   01/30/23 1618  Acid Fast Smear (AFB)  RELEASE UPON ORDERING,   TIMED       Comments: Specimen A:  Phone [phone] Immunocompromised?  No  Antibiotic Treatment:  none Is the patient on airborne/droplet  precautions? No Clinical History:  osteomyelitis left great toe Special Instructions:  none Specimen Disposition:  OR Specimen Holding     01/30/23 1618   01/30/23 1618  Acid Fast Smear (AFB)  RELEASE UPON ORDERING,   TIMED       Comments: Specimen B:  Phone [phone] Immunocompromised?  No  Antibiotic Treatment:  none Is the patient on airborne/droplet precautions? No Clinical History:  osteomyelitis left great toe Special Instructions:  none Specimen Disposition:  OR Specimen Holding     01/30/23 1618   01/30/23 1618  Fungus Culture With Stain  RELEASE UPON ORDERING,   TIMED       Comments: Specimen A:  Phone [phone] Immunocompromised?  No  Antibiotic Treatment:  none Is the patient on airborne/droplet precautions? No Clinical History:  osteomyelitis left great toe Special Instructions:  none Specimen Disposition:  OR Specimen Holding     01/30/23 1618   01/30/23 1618  Fungus Culture With Stain  RELEASE UPON ORDERING,   TIMED       Comments: Specimen B:  Phone [phone] Immunocompromised?  No  Antibiotic Treatment:  none Is the patient on airborne/droplet precautions? No Clinical History:  osteomyelitis left great toe Special Instructions:  none Specimen Disposition:  OR Specimen Holding     01/30/23 1618            Total time spent in review of labs and imaging, patient evaluation, formulation of plan, documentation and communication with family: 45 minutes  Signed, Lorin Glass, MD [facility] 02/01/2023

## 2023-02-01 NOTE — Progress Notes (Signed)
  Subjective:  Patient ID: [name]., male    DOB: 10-10-1945,  MRN: [id number]   DOS: 01/30/23 Procedure: Left hallux amputation at MPJ level  77 y.o. male seen for post op check. Reports doing well denies pain.  Review of Systems: Negative except as noted in the HPI. Denies N/V/F/Ch.   Objective:   Vitals:   01/31/23 2002 02/01/23 0416  BP: (!) 136/55 112/60  Pulse: 66 (!) 58  Resp: 18   Temp: 97.8 F (36.6 C) 98.3 F (36.8 C)  SpO2: 100% 94%   Body mass index is 26.04 kg/m. Constitutional Well developed. Well nourished.  Vascular Foot warm and well perfused. Capillary refill normal to all digits.   No calf pain with palpation  Neurologic Normal speech. Oriented to person, place, and time. Epicritic sensation absent  Dermatologic Amputation site on left foot healing with mild venous congestion at the amp site, no frank incisional necrosis or evidence of non healing at this time. Left 2nd toe stable without drainage. 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  POD # 2 s/p left great toe amputation MPJ level -Progressing well no acute post op complication. Left 2nd toe and right hallux stable no evidence of deep infection.  -XR: Disarticulation of the great toe  left foot -WB Status: WBAT in postop shoe -Sutures: to remain intact. -Medications/ABX: 7 days augmentin at DC -Foot redressed. - Return to clinic on 1/2 or 1/3 with Dr. Logan Bores. Leave dressing C/D/I until follow up. Will sign off at this time, pt stable for DC from my standpoint.         Corinna Gab, DPM [facility] / [facility]

## 2023-02-01 NOTE — NC FL2 (Signed)
[place] MEDICAID FL2 LEVEL OF CARE FORM     IDENTIFICATION  Patient Name: Amori Mcnabb Cubero Jr. Birthdate: 1945/07/08 Sex: male Admission Date (Current Location): 01/30/2023  [place] and [place] Number:  Producer, television/film/video and Address:  [facility], [address] [place], [place] [address]      Provider Number: [id number]  Attending Physician Name and Address:  Lorin Glass, MD  Relative Name and Phone Number:  Oval Littrel; Spouse; [phone]    Current Level of Care: Hospital Recommended Level of Care: Skilled Nursing Facility Prior Approval Number:    Date Approved/Denied:   PASRR Number: [id number]  Discharge Plan: SNF    Current Diagnoses: Patient Active Problem List   Diagnosis Date Noted   Osteomyelitis of great toe of left foot (HCC) 01/30/2023   Chronic kidney disease, stage 3a (HCC) 10/10/2022   Diabetic ulcer of toe (HCC) 10/10/2022   Thrombocytopenia (HCC) 10/10/2022   Local infection of skin and subcutaneous tissue 09/21/2022   Chronic pain syndrome 04/01/2017   Chronic kidney disease, stage II (mild) 03/31/2017   Coronary artery disease involving native coronary artery 03/31/2017   Depression 03/31/2017   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) 03/31/2017   Fecal impaction (HCC) 03/31/2017   History of CVA (cerebrovascular accident) 03/31/2017   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/05/2015   Other fatigue 10/05/2015   Dyspnea 10/05/2015   Late effects of cerebrovascular accident 04/20/2013   Multiple sclerosis (HCC) 06/04/2012   Cerebral artery occlusion with cerebral infarction (HCC) 03/31/2011   Abnormality of gait 03/31/2011   Diabetic polyneuropathy (HCC) 03/31/2011   Pure hypercholesterolemia 01/07/2009   Type 1 diabetes mellitus with complications (HCC) 01/06/2009   B12 DEFICIENCY 01/06/2009   Essential hypertension  01/06/2009   Coronary atherosclerosis 01/06/2009    Orientation RESPIRATION BLADDER Height & Weight     Fenstermacher, Time, Place, Situation  Normal (Room Air) Continent Weight: 192 lb 0.3 oz (87.1 kg) Height:  6' (182.9 cm)  BEHAVIORAL SYMPTOMS/MOOD NEUROLOGICAL BOWEL NUTRITION STATUS      Continent Diet (Please see dc summary)  AMBULATORY STATUS COMMUNICATION OF NEEDS Skin   Extensive Assist Verbally Surgical wounds (Incision (Closed) 01/30/23 Leg Left)                       Personal Care Assistance Level of Assistance  Feeding, Dressing, Bathing Bathing Assistance: Maximum assistance Feeding assistance: Maximum assistance Dressing Assistance: Maximum assistance     Functional Limitations Info  Hearing   Hearing Info: Impaired (R and L (Hearing Aids))      SPECIAL CARE FACTORS FREQUENCY  PT (By licensed PT), OT (By licensed OT)     PT Frequency: 5x OT Frequency: 5x            Contractures Contractures Info: Not present    Additional Factors Info  Code Status, Allergies Code Status Info: DNR Limited- Do Not Intubate/DNI Allergies Info: NKA           Current Medications (02/01/2023):  This is the current hospital active medication list Current Facility-Administered Medications  Medication Dose Route Frequency Provider Last Rate Last Admin   acetaminophen (TYLENOL) tablet 650 mg  650 mg Oral Q6H PRN Steffanie Rainwater, MD       Or   acetaminophen (TYLENOL) suppository 650 mg  650 mg Rectal Q6H PRN Steffanie Rainwater, MD  amLODipine (NORVASC) tablet 5 mg  5 mg Oral q morning Steffanie Rainwater, MD   5 mg at 02/01/23 0849   amoxicillin-clavulanate (AUGMENTIN) 875-125 MG per tablet 1 tablet  1 tablet Oral Q12H Dahal, Melina Schools, MD   1 tablet at 02/01/23 1036   aspirin EC tablet 81 mg  81 mg Oral Daily Steffanie Rainwater, MD   81 mg at 02/01/23 0849   atorvastatin (LIPITOR) tablet 10 mg  10 mg Oral QHS Steffanie Rainwater, MD   10 mg at 01/31/23 2057    clopidogrel (PLAVIX) tablet 75 mg  75 mg Oral Daily Steffanie Rainwater, MD   75 mg at 02/01/23 0849   DULoxetine (CYMBALTA) DR capsule 60 mg  60 mg Oral BID Steffanie Rainwater, MD   60 mg at 02/01/23 0849   enoxaparin (LOVENOX) injection 40 mg  40 mg Subcutaneous Q24H Steffanie Rainwater, MD   40 mg at 01/31/23 2056   finasteride (PROSCAR) tablet 5 mg  5 mg Oral q morning Steffanie Rainwater, MD   5 mg at 02/01/23 0849   insulin aspart (novoLOG) injection 0-5 Units  0-5 Units Subcutaneous QHS Lorin Glass, MD   3 Units at 01/31/23 2104   insulin aspart (novoLOG) injection 0-9 Units  0-9 Units Subcutaneous TID WC Dahal, Melina Schools, MD   1 Units at 02/01/23 1257   insulin glargine-yfgn (SEMGLEE) injection 30 Units  30 Units Subcutaneous QHS Dahal, Melina Schools, MD       metoprolol tartrate (LOPRESSOR) tablet 25 mg  25 mg Oral BID Steffanie Rainwater, MD   25 mg at 02/01/23 0849   ondansetron (ZOFRAN) tablet 4 mg  4 mg Oral Q6H PRN Steffanie Rainwater, MD       Or   ondansetron [facility]) injection 4 mg  4 mg Intravenous Q6H PRN Steffanie Rainwater, MD   4 mg at 02/01/23 1136   oxyCODONE (Oxy IR/ROXICODONE) immediate release tablet 15 mg  15 mg Oral Q6H PRN Steffanie Rainwater, MD   15 mg at 02/01/23 0847   polyethylene glycol (MIRALAX / GLYCOLAX) packet 17 g  17 g Oral QHS Steffanie Rainwater, MD   17 g at 01/31/23 2057   senna-docusate (Senokot-S) tablet 1 tablet  1 tablet Oral QHS PRN Steffanie Rainwater, MD       tapentadol (NUCYNTA) 12 hr tablet 200 mg  200 mg Oral Q12H Dahal, Binaya, MD   200 mg at 02/01/23 1034   traZODone (DESYREL) tablet 150 mg  150 mg Oral QHS Steffanie Rainwater, MD   150 mg at 01/31/23 2056   triamcinolone 0.1 % cream : eucerin cream, 1:1   Topical BID PRN Steffanie Rainwater, MD         Discharge Medications: Please see discharge summary for a list of discharge medications.  Relevant Imaging Results:  Relevant Lab Results:   Additional Information SS# [id number]  Marliss Coots, LCSW

## 2023-02-01 NOTE — TOC CM/SW Note (Signed)
02-01-23 4403, Case Manager spoke with spouse and patient is now agreeable to SNF. CSW and MD aware of patient being in agreement. Unit CSW to follow for disposition needs as the patient progresses.

## 2023-02-01 NOTE — Plan of Care (Signed)
  Problem: Nutrition: Goal: Adequate nutrition will be maintained Outcome: Progressing   Problem: Elimination: Goal: Will not experience complications related to bowel motility Outcome: Progressing   Problem: Pain Management: Goal: General experience of comfort will improve Outcome: Progressing   Problem: Safety: Goal: Ability to remain free from injury will improve Outcome: Progressing

## 2023-02-01 NOTE — Progress Notes (Addendum)
   02/01/23 1145  Mobility  Activity Ambulated with assistance in room  Level of Assistance Minimal assist, patient does 75% or more (ModA+2)  Assistive Device Front wheel walker (post op boot on L)  Distance Ambulated (ft) 10 ft  LLE Weight Bearing Per Provider Order WBAT  Activity Response Tolerated fair  Mobility Referral Yes  Mobility visit 1 Mobility  Mobility Specialist Start Time (ACUTE ONLY) 1153  Mobility Specialist Stop Time (ACUTE ONLY) 1145  Mobility Specialist Time Calculation (min) (ACUTE ONLY) 1432 min   Mobility Specialist: Progress Note  Pt agreeable to mobility session - received in bed. Required ModA +2 STS, MinA+2 for ambulation, pt with unsteady gait throughout. C/o nausea and  BR urgency stating "Im going to walk to that bathroom using that walker and that boot." Returned to BR with all needs met - call bell within reach. RN and NT present.   Barnie Mort, BS Mobility Specialist Please contact via SecureChat or Rehab office at [phone].

## 2023-02-01 NOTE — Plan of Care (Signed)

## 2023-02-02 DIAGNOSIS — M869 Osteomyelitis, unspecified: Secondary | ICD-10-CM | POA: Diagnosis not present

## 2023-02-02 LAB — ACID FAST SMEAR (AFB, MYCOBACTERIA): Acid Fast Smear: NEGATIVE

## 2023-02-02 LAB — GLUCOSE, CAPILLARY
Glucose-Capillary: 107 mg/dL — ABNORMAL HIGH (ref 70–99)
Glucose-Capillary: 121 mg/dL — ABNORMAL HIGH (ref 70–99)
Glucose-Capillary: 186 mg/dL — ABNORMAL HIGH (ref 70–99)
Glucose-Capillary: 148 mg/dL — ABNORMAL HIGH (ref 70–99)

## 2023-02-02 LAB — SURGICAL PATHOLOGY

## 2023-02-02 NOTE — Progress Notes (Addendum)
   02/02/23 1003  Mobility  Activity Transferred from bed to chair  Level of Assistance Minimal assist, patient does 75% or more  Assistive Device Front wheel walker (post op boot)  LLE Weight Bearing Per Provider Order WBAT  Activity Response Tolerated fair  Mobility Referral Yes  Mobility visit 1 Mobility  Mobility Specialist Start Time (ACUTE ONLY) [id number]  Mobility Specialist Stop Time (ACUTE ONLY) 1003  Mobility Specialist Time Calculation (min) (ACUTE ONLY) 20 min   Mobility Specialist: Progress Note  Pt agreeable to mobility session - received in bed. Required MinA using RW with bed elevated. C/o no feeling in his left foot but feels the "pressure" of the post op boot. Returned to chair with all needs met - call bell within reach. Chair alarm on. Further mobility deferred by pt c/o soaked bandages yesterday d/t stating "they have me on blood thinners."  Barnie Mort, BS Mobility Specialist Please contact via SecureChat or Rehab office at [phone].

## 2023-02-02 NOTE — TOC Progression Note (Signed)
Transition of Care (TOC) - Progression Note    Patient Details  Name: [name] Milhouse Kinner Jr. MRN: [id number] Date of Birth: 10-Jul-1945  Transition of [facility]) CM/SW Contact  Carley Hammed, LCSW Phone Number: 02/02/2023, 12:39 PM  Clinical Narrative:    CSW met with pt at bedside and provided bed offers. Pt requests [facility] as he has been there before. CSW spoke with [facility] and they are able to offer a bed tomorrow. Pt agreeable to this plan. CSW left number at bedside in case spouse has any questions. Pt will not need authorization. TOC will continue to follow.    Expected Discharge Plan: Skilled Nursing Facility Barriers to Discharge: Continued Medical Work up, SNF Pending bed offer  Expected Discharge Plan and Services In-house Referral: Clinical Social Work     Living arrangements for the past 2 months: Single Family Home                                       Social Determinants of Health (SDOH) Interventions SDOH Screenings   Food Insecurity: No Food Insecurity (01/30/2023)  Housing: Low Risk  (01/30/2023)  Transportation Needs: No Transportation Needs (01/30/2023)  Utilities: Not At Risk (01/30/2023)  Tobacco Use: Medium Risk (01/30/2023)    Readmission Risk Interventions     No data to display

## 2023-02-02 NOTE — Plan of Care (Signed)
  Problem: Education: Goal: Knowledge of General Education information will improve Description: Including pain rating scale, medication(s)/side effects and non-pharmacologic comfort measures Outcome: Progressing   Problem: Nutrition: Goal: Adequate nutrition will be maintained Outcome: Progressing   Problem: Elimination: Goal: Will not experience complications related to urinary retention Outcome: Progressing   

## 2023-02-02 NOTE — Evaluation (Signed)
Occupational Therapy Evaluation Patient Details Name: [name] Silvestro Mula Jr. MRN: [id number] DOB: 01-18-1946 Today's Date: 02/02/2023   History of Present Illness Pt is a 77 y/o male who presents 01/30/2023 from podiatrist's office for L great toe wound. 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 He is WBAT in the post-op shoe. 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   Clinical Impression   Patient admitted for the diagnosis above.  PTA he lives at home with his SO, and requires assist with mobility and ADL completion.  Pain and unsteadiness are the primary deficit.  Currently he is needing up to Mod to Max A for ADL completion and Mod A for basic transfers.  OT will continue efforts in the acute setting to address deficits, and Patient will benefit from continued inpatient follow up therapy, <3 hours/day        If plan is discharge home, recommend the following: Assist for transportation;Assistance with cooking/housework;A lot of help with walking and/or transfers;A lot of help with bathing/dressing/bathroom;Help with stairs or ramp for entrance    Functional Status Assessment  Patient has had a recent decline in their functional status and demonstrates the ability to make significant improvements in function in a reasonable and predictable amount of time.  Equipment Recommendations  BSC/3in1    Recommendations for Other Services       Precautions / Restrictions Precautions Precautions: Fall Required Braces or Orthoses: Other Brace Other Brace: Post-op shoe present in room for B feet Restrictions Weight Bearing Restrictions Per Provider Order: Yes LLE Weight Bearing Per Provider Order: Weight bearing as  tolerated Other Position/Activity Restrictions: In post-op shoe.      Mobility Bed Mobility Overal bed mobility: Needs Assistance Bed Mobility: Sit to Supine       Sit to supine: Supervision        Transfers Overall transfer level: Needs assistance Equipment used: Rolling walker (2 wheels) Transfers: Sit to/from Stand, Bed to chair/wheelchair/BSC Sit to Stand: Min assist, Mod assist     Step pivot transfers: Mod assist     General transfer comment: Willing to use the RW, post op shoe still slides, unsteady.      Balance Overall balance assessment: Needs assistance Sitting-balance support: Feet supported, No upper extremity supported Sitting balance-Leahy Scale: Fair     Standing balance support: Reliant on assistive device for balance Standing balance-Leahy Scale: Poor                             ADL either performed or assessed with clinical judgement   ADL Overall ADL's : Needs assistance/impaired Eating/Feeding: Set up;Sitting   Grooming: Wash/dry hands;Wash/dry face;Set up;Sitting   Upper Body Bathing: Minimal assistance;Sitting   Lower Body Bathing: Maximal assistance;Sitting/lateral leans;Sit to/from stand   Upper Body Dressing : Minimal assistance;Sitting   Lower Body Dressing: Maximal assistance;Sitting/lateral leans;Sit to/from stand   Toilet Transfer: Moderate assistance;Rolling walker (2 wheels);BSC/3in1;Stand-pivot   Toileting- Clothing Manipulation and Hygiene: Maximal assistance;Sit to/from stand               Vision Patient Visual Report: No change from baseline       Perception Perception: Not tested  Praxis Praxis: Not tested       Pertinent Vitals/Pain Pain Assessment Pain Assessment: Faces Faces Pain Scale: Hurts even more Pain Location: Generalized to hands and legs, feet are numb at baseline Pain Descriptors / Indicators: Operative site guarding, Pins and needles, Sore, Grimacing, Guarding Pain  Intervention(s): Monitored during session, Patient requesting pain meds-RN notified     Extremity/Trunk Assessment Upper Extremity Assessment Upper Extremity Assessment: Overall WFL for tasks assessed   Lower Extremity Assessment Lower Extremity Assessment: Defer to PT evaluation   Cervical / Trunk Assessment Cervical / Trunk Assessment: Kyphotic   Communication Communication Communication: No apparent difficulties   Cognition Arousal: Alert Behavior During Therapy: Impulsive, Restless Overall Cognitive Status: No family/caregiver present to determine baseline cognitive functioning                                       General Comments   VSS on RA    Exercises     Shoulder Instructions      Home Living Family/patient expects to be discharged to:: Private residence Living Arrangements: Spouse/significant other Available Help at Discharge: Family;Available 24 hours/day Type of Home: House Home Access: Ramped entrance     Home Layout: One level     Bathroom Shower/Tub: Sponge bathes at baseline   Bathroom Toilet: Standard Bathroom Accessibility: No   Home Equipment: Hand held shower head;Electric scooter;Wheelchair Financial trader (4 wheels);Wheelchair - power   Additional Comments: uses rollator and scooter, electric w/c too wide to fit through his home's doorways      Prior Functioning/Environment Prior Level of Function : Needs assist             Mobility Comments: Pt can walk 8-10' at a time with rollator. Uses wheelchair or scooter for longer distances. Falls at home PTA ADLs Comments: Wife assist with sponge bath, pt does not get in the shower anymore. Wife sets out clothes for him and pt states he can dress himself.        OT Problem List: Decreased strength;Decreased activity tolerance;Impaired balance (sitting and/or standing);Impaired sensation;Decreased knowledge of use of DME or AE;Decreased safety awareness;Pain      OT  Treatment/Interventions: Bistline-care/ADL training;Therapeutic activities;Therapeutic exercise;Balance training;Patient/family education;DME and/or AE instruction    OT Goals(Current goals can be found in the care plan section) Acute Rehab OT Goals Patient Stated Goal: Return home when I am moving a little better OT Goal Formulation: With patient Time For Goal Achievement: 02/16/23 Potential to Achieve Goals: Good ADL Goals Pt Will Perform Grooming: with supervision;standing Pt Will Perform Lower Body Dressing: with contact guard assist;sit to/from stand Pt Will Transfer to Toilet: with contact guard assist;ambulating;regular height toilet Pt/caregiver will Perform Home Exercise Program: Increased strength;Both right and left upper extremity;With theraband;With Supervision  OT Frequency: Min 1X/week    Co-evaluation              AM-PAC OT "6 Clicks" Daily Activity     Outcome Measure Help from another person eating meals?: A Little Help from another person taking care of personal grooming?: A Little Help from another person toileting, which includes using toliet, bedpan, or urinal?: A Lot Help from another person bathing (including washing, rinsing, drying)?: A Lot Help from another person to put on and taking off regular upper body clothing?: A Little Help from another person to put on and taking off regular lower body clothing?: A Lot 6 Click  Score: 15   End of Session Equipment Utilized During Treatment: Gait belt;Rolling walker (2 wheels) Nurse Communication: Mobility status  Activity Tolerance: Patient tolerated treatment well Patient left: in bed;with call bell/phone within reach;with bed alarm set  OT Visit Diagnosis: Unsteadiness on feet (R26.81);Muscle weakness (generalized) (M62.81);Pain Pain - Right/Left: Right Pain - part of body: Hand                Time: 1050-1111 OT Time Calculation (min): 21 min Charges:  OT General Charges $OT Visit: 1 Visit OT  Evaluation $OT Eval Moderate Complexity: 1 Mod  02/02/2023  RP, OTR/L  Acute Rehabilitation Services  Office:  [phone]   Suzanna Obey 02/02/2023, 11:20 AM

## 2023-02-02 NOTE — Progress Notes (Signed)
PROGRESS NOTE  [name] M Younglove Jr.  DOB: [birth year]  PCP: [name] Paradise, MD MBW:[id number]  DOA: 01/30/2023  LOS: 3 days  Hospital Day: 4  Brief narrative: [name] Manis. is a 77 y.o. male with PMH significant for DM2, HTN, HLD, CVA, CAD, CKD, diabetic neuropathy, diabetic foot ulcer, charcoaled's arthropathy BPH, depression 12/23, patient was admitted from podiatry service for concern of osteomyelitis of left great toe.   Patient follows up with podiatry for left great toe wound.  He was scheduled for an arthroplasty by [name] at the office on 12/23. However, over the past week, patient developed an ulcer on the left great toe. He was seen in the office in 12/20 left foot x-ray showed osteomyelitis of the left great toe IPJ.  He was started on a course of doxycycline 100 mg twice daily for 10 days.    12/21, patient started to have active bleeding from the ulcer, seen at podiatrist office again on 12/23. Elective procedure was canceled and patient was sent to the hospital for inpatient management.  In the ED, patient is afebrile, heart rate in 50s and 60s, blood pressure in 150s, breathing on room air Labs with WC count normal, hemoglobin normal, MCV 102, BN/creatinine 19/1.36 Admitted to [facility] Podiatry consulted 12/23, patient underwent amputation of left great toe at MPJ level.  Subjective: Patient was seen and examined this morning. Propped up in bed.  Not in distress. Had bleeding from surgical site in the left foot yesterday.  No bleeding today.  Assessment and plan: Left great toe osteomyelitis Diabetic foot ulcer S/p left great toe amputation at MPJ level - 12/23 [name] Per operative note, clear margin at her left hallux amputation site.  Initially treated with broad-spectrum antibiotics, currently on oral Augmentin. Per podiatry recommendation, recommend WBAT on postop shoe bilateral.  Dressing to remain clean dry and intact.  Podiatry to follow-up if bleeding  recurs. To follow-up with podiatry [name] as an outpatient next week 1/2 or 1/3. Continue pain control with oxycodone and Nucynta as before.  Type 2 diabetes mellitus A1c 5.8 on 10/11/2022 PTA meds-insulin NPH and Premeal Humalog. Currently on basal bolus regimen with Semglee and SSI with Accu-Cheks. Currently on Semglee 30 units nightly along with SSI/Accu-Cheks  Recent Labs  Lab 02/01/23 0752 02/01/23 1227 02/01/23 1645 02/01/23 2109 02/02/23 0825  GLUCAP 160* 140* 141* 181* 107*   HTN Continue home metoprolol and amlodipine   H/o CVA, HLD Continue aspirin, Plavix and atorvastatin   BPH Continue finasteride   Depression Continue on duloxetine   Insomnia Continue trazodone at bedtime  Impaired mobility I discussed with patient's wife this afternoon.  He has MS and diabetic neuropathy.  Over the years, his mobility has impaired.  She states he has fallen so many times at home.  She has trying to help him and keep him home for as an as she could but she states she is not able to handle his level of care at this time.  She is expecting a discharge to SNF.  PT eval obtained.  SNF recommended   Goals of care   Code Status: Limited: Do not attempt resuscitation (DNR) -DNR-LIMITED -Do Not Intubate/DNI     DVT prophylaxis:  enoxaparin (LOVENOX) injection 40 mg Start: 01/30/23 2000   Antimicrobials: Oral Augmentin Fluid: None Consultants: Podiatry Family Communication: Discussed with patient's wife  Status: Inpatient Level of care:  Med-Surg   Patient is from: Home Needs to continue in-hospital care: Pending  SNF   Diet:  Diet Order             Diet Carb Modified Fluid consistency: Thin  Diet effective now                   Scheduled Meds:  amLODipine  5 mg Oral q morning   amoxicillin-clavulanate  1 tablet Oral Q12H   aspirin EC  81 mg Oral Daily   atorvastatin  10 mg Oral QHS   clopidogrel  75 mg Oral Daily   DULoxetine  60 mg Oral BID    enoxaparin (LOVENOX) injection  40 mg Subcutaneous Q24H   finasteride  5 mg Oral q morning   insulin aspart  0-5 Units Subcutaneous QHS   insulin aspart  0-9 Units Subcutaneous TID WC   insulin glargine-yfgn  30 Units Subcutaneous QHS   metoprolol tartrate  25 mg Oral BID   polyethylene glycol  17 g Oral QHS   tapentadol  200 mg Oral Q12H   traZODone  150 mg Oral QHS    PRN meds: acetaminophen **OR** acetaminophen, ondansetron **OR** ondansetron (ZOFRAN) IV, oxyCODONE, senna-docusate, triamcinolone 0.1 % cream : eucerin   Infusions:     Antimicrobials: Anti-infectives (From admission, onward)    Start     Dose/Rate Route Frequency Ordered Stop   02/01/23 1045  amoxicillin-clavulanate (AUGMENTIN) 875-125 MG per tablet 1 tablet        1 tablet Oral Every 12 hours 02/01/23 0959     02/01/23 1000  doxycycline (VIBRA-TABS) tablet 100 mg  Status:  Discontinued        100 mg Oral Every 12 hours 02/01/23 0752 02/01/23 0959   01/31/23 1600  Vancomycin (VANCOCIN) 1,500 mg in sodium chloride 0.9 % 500 mL IVPB  Status:  Discontinued        1,500 mg 250 mL/hr over 120 Minutes Intravenous Every 24 hours 01/30/23 1446 02/01/23 0752   01/30/23 1600  metroNIDAZOLE (FLAGYL) IVPB 500 mg  Status:  Discontinued        500 mg 100 mL/hr over 60 Minutes Intravenous Every 12 hours 01/30/23 1439 02/01/23 0752   01/30/23 1600  ceFEPIme (MAXIPIME) 2 g in sodium chloride 0.9 % 100 mL IVPB  Status:  Discontinued        2 g 200 mL/hr over 30 Minutes Intravenous Every 12 hours 01/30/23 1441 02/01/23 0752   01/30/23 1530  vancomycin (VANCOREADY) IVPB 1750 mg/350 mL        1,750 mg 175 mL/hr over 120 Minutes Intravenous  Once 01/30/23 1442 01/30/23 1621       Objective: Vitals:   02/02/23 0827 02/02/23 0908  BP: (!) 145/74 (!) 145/74  Pulse: 70 70  Resp: 16   Temp: 98.4 F (36.9 C)   SpO2: 95%     Intake/Output Summary (Last 24 hours) at 02/02/2023 0931 Last data filed at 02/02/2023 0355 Gross  per 24 hour  Intake 120 ml  Output 1600 ml  Net -1480 ml   Filed Weights   01/30/23 1100  Weight: 87.1 kg   Weight change:  Body mass index is 26.04 kg/m.   Physical Exam: General exam: Pleasant, elderly Caucasian male.  Not in distress Skin: No rashes, lesions or ulcers. HEENT: Atraumatic, normocephalic, no obvious bleeding Lungs: Clear to auscultation bilaterally,  CVS: S1, S2, no murmur,   GI/Abd: Soft, nontender, nondistended, bowel sound present,   CNS: Alert, awake, oriented to place and person Psychiatry: Upset that he has to go  to SNF and cannot return home yet Extremities: No pedal edema, no calf tenderness, left foot in bandage..  No soakage noted at this time.  Data Review: I have personally reviewed the laboratory data and studies available.  F/u labs  Unresulted Labs (From admission, onward)     Start     Ordered   02/03/23 0500  Basic metabolic panel  Tomorrow morning,   R        02/02/23 0835   02/03/23 0500  CBC with Differential/Platelet  Tomorrow morning,   R        02/02/23 0835   01/30/23 1618  Acid Fast Culture with reflexed sensitivities  RELEASE UPON ORDERING,   TIMED       Comments: Specimen A:  Phone [phone] Immunocompromised?  No  Antibiotic Treatment:  none Is the patient on airborne/droplet precautions? No Clinical History:  osteomyelitis left great toe Special Instructions:  none Specimen Disposition:  OR Specimen Holding     01/30/23 1618   01/30/23 1618  Acid Fast Culture with reflexed sensitivities  RELEASE UPON ORDERING,   TIMED       Comments: Specimen B:  Phone [phone] Immunocompromised?  No  Antibiotic Treatment:  none Is the patient on airborne/droplet precautions? No Clinical History:  osteomyelitis left great toe Special Instructions:  none Specimen Disposition:  OR Specimen Holding     01/30/23 1618   01/30/23 1618  Acid Fast Smear (AFB)  RELEASE UPON ORDERING,   TIMED       Comments: Specimen B:  Phone  [phone] Immunocompromised?  No  Antibiotic Treatment:  none Is the patient on airborne/droplet precautions? No Clinical History:  osteomyelitis left great toe Special Instructions:  none Specimen Disposition:  OR Specimen Holding     01/30/23 1618   01/30/23 1618  Fungus Culture With Stain  RELEASE UPON ORDERING,   TIMED       Comments: Specimen A:  Phone [phone] Immunocompromised?  No  Antibiotic Treatment:  none Is the patient on airborne/droplet precautions? No Clinical History:  osteomyelitis left great toe Special Instructions:  none Specimen Disposition:  OR Specimen Holding     01/30/23 1618   01/30/23 1618  Fungus Culture With Stain  RELEASE UPON ORDERING,   TIMED       Comments: Specimen B:  Phone [phone] Immunocompromised?  No  Antibiotic Treatment:  none Is the patient on airborne/droplet precautions? No Clinical History:  osteomyelitis left great toe Special Instructions:  none Specimen Disposition:  OR Specimen Holding     01/30/23 1618            Total time spent in review of labs and imaging, patient evaluation, formulation of plan, documentation and communication with family: 25 minutes  Signed, Lorin Glass, MD [facility] 02/02/2023

## 2023-02-02 NOTE — Plan of Care (Signed)

## 2023-02-03 DIAGNOSIS — G4700 Insomnia, unspecified: Secondary | ICD-10-CM | POA: Diagnosis present

## 2023-02-03 DIAGNOSIS — E785 Hyperlipidemia, unspecified: Secondary | ICD-10-CM | POA: Diagnosis not present

## 2023-02-03 DIAGNOSIS — R278 Other lack of coordination: Secondary | ICD-10-CM | POA: Diagnosis not present

## 2023-02-03 DIAGNOSIS — M79672 Pain in left foot: Secondary | ICD-10-CM | POA: Diagnosis not present

## 2023-02-03 DIAGNOSIS — Z79899 Other long term (current) drug therapy: Secondary | ICD-10-CM | POA: Diagnosis not present

## 2023-02-03 DIAGNOSIS — N179 Acute kidney failure, unspecified: Secondary | ICD-10-CM | POA: Diagnosis present

## 2023-02-03 DIAGNOSIS — M8580 Other specified disorders of bone density and structure, unspecified site: Secondary | ICD-10-CM | POA: Diagnosis present

## 2023-02-03 DIAGNOSIS — Z7401 Bed confinement status: Secondary | ICD-10-CM | POA: Diagnosis not present

## 2023-02-03 DIAGNOSIS — R079 Chest pain, unspecified: Secondary | ICD-10-CM | POA: Diagnosis not present

## 2023-02-03 DIAGNOSIS — R457 State of emotional shock and stress, unspecified: Secondary | ICD-10-CM | POA: Diagnosis not present

## 2023-02-03 DIAGNOSIS — R2689 Other abnormalities of gait and mobility: Secondary | ICD-10-CM | POA: Diagnosis not present

## 2023-02-03 DIAGNOSIS — R471 Dysarthria and anarthria: Secondary | ICD-10-CM | POA: Diagnosis not present

## 2023-02-03 DIAGNOSIS — E11621 Type 2 diabetes mellitus with foot ulcer: Secondary | ICD-10-CM | POA: Diagnosis not present

## 2023-02-03 DIAGNOSIS — I2510 Atherosclerotic heart disease of native coronary artery without angina pectoris: Secondary | ICD-10-CM | POA: Diagnosis not present

## 2023-02-03 DIAGNOSIS — G894 Chronic pain syndrome: Secondary | ICD-10-CM | POA: Diagnosis present

## 2023-02-03 DIAGNOSIS — N189 Chronic kidney disease, unspecified: Secondary | ICD-10-CM | POA: Diagnosis not present

## 2023-02-03 DIAGNOSIS — G250 Essential tremor: Secondary | ICD-10-CM | POA: Diagnosis present

## 2023-02-03 DIAGNOSIS — E1042 Type 1 diabetes mellitus with diabetic polyneuropathy: Secondary | ICD-10-CM | POA: Diagnosis present

## 2023-02-03 DIAGNOSIS — Z89412 Acquired absence of left great toe: Secondary | ICD-10-CM | POA: Diagnosis not present

## 2023-02-03 DIAGNOSIS — G35 Multiple sclerosis: Secondary | ICD-10-CM | POA: Diagnosis present

## 2023-02-03 DIAGNOSIS — E10610 Type 1 diabetes mellitus with diabetic neuropathic arthropathy: Secondary | ICD-10-CM | POA: Diagnosis present

## 2023-02-03 DIAGNOSIS — N1832 Chronic kidney disease, stage 3b: Secondary | ICD-10-CM | POA: Diagnosis present

## 2023-02-03 DIAGNOSIS — R739 Hyperglycemia, unspecified: Secondary | ICD-10-CM | POA: Diagnosis not present

## 2023-02-03 DIAGNOSIS — E10649 Type 1 diabetes mellitus with hypoglycemia without coma: Secondary | ICD-10-CM | POA: Diagnosis not present

## 2023-02-03 DIAGNOSIS — M85872 Other specified disorders of bone density and structure, left ankle and foot: Secondary | ICD-10-CM | POA: Diagnosis not present

## 2023-02-03 DIAGNOSIS — I252 Old myocardial infarction: Secondary | ICD-10-CM | POA: Diagnosis not present

## 2023-02-03 DIAGNOSIS — E1110 Type 2 diabetes mellitus with ketoacidosis without coma: Secondary | ICD-10-CM | POA: Diagnosis not present

## 2023-02-03 DIAGNOSIS — M869 Osteomyelitis, unspecified: Secondary | ICD-10-CM | POA: Diagnosis not present

## 2023-02-03 DIAGNOSIS — I129 Hypertensive chronic kidney disease with stage 1 through stage 4 chronic kidney disease, or unspecified chronic kidney disease: Secondary | ICD-10-CM | POA: Diagnosis present

## 2023-02-03 DIAGNOSIS — E1010 Type 1 diabetes mellitus with ketoacidosis without coma: Secondary | ICD-10-CM | POA: Diagnosis not present

## 2023-02-03 DIAGNOSIS — E663 Overweight: Secondary | ICD-10-CM | POA: Diagnosis present

## 2023-02-03 DIAGNOSIS — G8929 Other chronic pain: Secondary | ICD-10-CM | POA: Diagnosis not present

## 2023-02-03 DIAGNOSIS — Y835 Amputation of limb(s) as the cause of abnormal reaction of the patient, or of later complication, without mention of misadventure at the time of the procedure: Secondary | ICD-10-CM | POA: Diagnosis present

## 2023-02-03 DIAGNOSIS — E1111 Type 2 diabetes mellitus with ketoacidosis with coma: Secondary | ICD-10-CM | POA: Diagnosis not present

## 2023-02-03 DIAGNOSIS — Z794 Long term (current) use of insulin: Secondary | ICD-10-CM | POA: Diagnosis not present

## 2023-02-03 DIAGNOSIS — S92332A Displaced fracture of third metatarsal bone, left foot, initial encounter for closed fracture: Secondary | ICD-10-CM | POA: Diagnosis not present

## 2023-02-03 DIAGNOSIS — I10 Essential (primary) hypertension: Secondary | ICD-10-CM | POA: Diagnosis not present

## 2023-02-03 DIAGNOSIS — T8789 Other complications of amputation stump: Secondary | ICD-10-CM | POA: Diagnosis present

## 2023-02-03 DIAGNOSIS — Z66 Do not resuscitate: Secondary | ICD-10-CM | POA: Diagnosis present

## 2023-02-03 DIAGNOSIS — R262 Difficulty in walking, not elsewhere classified: Secondary | ICD-10-CM | POA: Diagnosis not present

## 2023-02-03 DIAGNOSIS — R1311 Dysphagia, oral phase: Secondary | ICD-10-CM | POA: Diagnosis not present

## 2023-02-03 DIAGNOSIS — I69354 Hemiplegia and hemiparesis following cerebral infarction affecting left non-dominant side: Secondary | ICD-10-CM | POA: Diagnosis not present

## 2023-02-03 DIAGNOSIS — E8809 Other disorders of plasma-protein metabolism, not elsewhere classified: Secondary | ICD-10-CM | POA: Diagnosis present

## 2023-02-03 DIAGNOSIS — N400 Enlarged prostate without lower urinary tract symptoms: Secondary | ICD-10-CM | POA: Diagnosis present

## 2023-02-03 DIAGNOSIS — L97518 Non-pressure chronic ulcer of other part of right foot with other specified severity: Secondary | ICD-10-CM | POA: Diagnosis not present

## 2023-02-03 DIAGNOSIS — M6281 Muscle weakness (generalized): Secondary | ICD-10-CM | POA: Diagnosis not present

## 2023-02-03 DIAGNOSIS — E1022 Type 1 diabetes mellitus with diabetic chronic kidney disease: Secondary | ICD-10-CM | POA: Diagnosis present

## 2023-02-03 DIAGNOSIS — M86572 Other chronic hematogenous osteomyelitis, left ankle and foot: Secondary | ICD-10-CM | POA: Diagnosis not present

## 2023-02-03 DIAGNOSIS — F339 Major depressive disorder, recurrent, unspecified: Secondary | ICD-10-CM | POA: Diagnosis not present

## 2023-02-03 LAB — CBC WITH DIFFERENTIAL/PLATELET
Abs Immature Granulocytes: 0.02 10*3/uL (ref 0.00–0.07)
Basophils Absolute: 0.1 10*3/uL (ref 0.0–0.1)
Basophils Relative: 1 %
Eosinophils Absolute: 0.3 10*3/uL (ref 0.0–0.5)
Eosinophils Relative: 5 %
HCT: 37.9 % — ABNORMAL LOW (ref 39.0–52.0)
Hemoglobin: 12.9 g/dL — ABNORMAL LOW (ref 13.0–17.0)
Immature Granulocytes: 0 %
Lymphocytes Relative: 22 %
Lymphs Abs: 1.3 10*3/uL (ref 0.7–4.0)
MCH: 34.1 pg — ABNORMAL HIGH (ref 26.0–34.0)
MCHC: 34 g/dL (ref 30.0–36.0)
MCV: 100.3 fL — ABNORMAL HIGH (ref 80.0–100.0)
Monocytes Absolute: 0.8 10*3/uL (ref 0.1–1.0)
Monocytes Relative: 13 %
Neutro Abs: 3.5 10*3/uL (ref 1.7–7.7)
Neutrophils Relative %: 59 %
Platelets: 79 10*3/uL — ABNORMAL LOW (ref 150–400)
RBC: 3.78 MIL/uL — ABNORMAL LOW (ref 4.22–5.81)
RDW: 13.2 % (ref 11.5–15.5)
WBC: 6 10*3/uL (ref 4.0–10.5)
nRBC: 0 % (ref 0.0–0.2)

## 2023-02-03 LAB — BASIC METABOLIC PANEL
Anion gap: 9 (ref 5–15)
BUN: 21 mg/dL (ref 8–23)
CO2: 27 mmol/L (ref 22–32)
Calcium: 8.2 mg/dL — ABNORMAL LOW (ref 8.9–10.3)
Chloride: 102 mmol/L (ref 98–111)
Creatinine, Ser: 1.37 mg/dL — ABNORMAL HIGH (ref 0.61–1.24)
GFR, Estimated: 54 mL/min — ABNORMAL LOW (ref >60)
Glucose, Bld: 150 mg/dL — ABNORMAL HIGH (ref 70–99)
Potassium: 4.1 mmol/L (ref 3.5–5.1)
Sodium: 138 mmol/L (ref 135–145)

## 2023-02-03 LAB — GLUCOSE, CAPILLARY
Glucose-Capillary: 152 mg/dL — ABNORMAL HIGH (ref 70–99)
Glucose-Capillary: 109 mg/dL — ABNORMAL HIGH (ref 70–99)

## 2023-02-03 MED ORDER — LACTINEX PO CHEW: 1.0000 | CHEWABLE_TABLET | Freq: Three times a day (TID) | ORAL | Status: DC

## 2023-02-03 MED ORDER — ACETAMINOPHEN 500 MG PO TABS: 1000.0000 mg | ORAL_TABLET | Freq: Three times a day (TID) | ORAL | Status: DC

## 2023-02-03 MED ORDER — OXYCODONE HCL 15 MG PO TABS: 15.0000 mg | ORAL_TABLET | Freq: Four times a day (QID) | ORAL | 0 refills | Status: AC | PRN

## 2023-02-03 MED ORDER — INSULIN ASPART 100 UNIT/ML IJ SOLN: 0.0000 [IU] | Freq: Three times a day (TID) | INTRAMUSCULAR | Status: DC

## 2023-02-03 MED ORDER — INSULIN ASPART 100 UNIT/ML IJ SOLN: 0.0000 [IU] | Freq: Every day | INTRAMUSCULAR | Status: DC

## 2023-02-03 MED ORDER — AMOXICILLIN-POT CLAVULANATE 875-125 MG PO TABS: 1.0000 | ORAL_TABLET | Freq: Two times a day (BID) | ORAL | Status: DC

## 2023-02-03 MED ORDER — NUCYNTA ER 200 MG PO TB12: 200.0000 mg | ORAL_TABLET | Freq: Two times a day (BID) | ORAL | 0 refills | Status: AC

## 2023-02-03 MED ORDER — INSULIN GLARGINE-YFGN 100 UNIT/ML ~~LOC~~ SOLN: 30.0000 [IU] | Freq: Every day | SUBCUTANEOUS | Status: DC

## 2023-02-03 MED ORDER — POLYETHYLENE GLYCOL 3350 17 G PO PACK
17.0000 g | PACK | Freq: Every day | ORAL | Status: DC
  Administered 2023-02-03: 17 g via ORAL

## 2023-02-03 NOTE — Plan of Care (Signed)
  Problem: Pain Management: Goal: General experience of comfort will improve Outcome: Progressing   Problem: Safety: Goal: Ability to remain free from injury will improve Outcome: Progressing   Problem: Skin Integrity: Goal: Risk for impaired skin integrity will decrease Outcome: Progressing

## 2023-02-03 NOTE — Discharge Summary (Signed)
Physician Discharge Summary  Osiris M Colleran Montez Hageman. ZOX:[id number] DOB: 08-05-1945 DOA: 01/30/2023  PCP: Geoffry Paradise, MD  Admit date: 01/30/2023 Discharge date: 02/03/2023  Admitted From: Home Discharge disposition: SNF  Recommendations at discharge:  Complete the course of antibiotics and neck 7 days with oral Augmentin and probiotics     Brief narrative: Karlton Lemon Tonnesen Montez Hageman. is a 77 y.o. male with PMH significant for DM2, HTN, HLD, CVA, CAD, CKD, diabetic neuropathy, diabetic foot ulcer, charcoaled's arthropathy BPH, depression 12/23, patient was admitted from podiatry service for concern of osteomyelitis of left great toe.   Patient follows up with podiatry for left great toe wound.  He was scheduled for an arthroplasty by Dr. Logan Bores at the office on 12/23. However, over the past week, patient developed an ulcer on the left great toe. He was seen in the office in 12/20 left foot x-ray showed osteomyelitis of the left great toe IPJ.  He was started on a course of doxycycline 100 mg twice daily for 10 days.    12/21, patient started to have active bleeding from the ulcer, seen at podiatrist office again on 12/23. Elective procedure was canceled and patient was sent to the hospital for inpatient management.  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  Subjective: Patient was seen and examined this morning. Lying on bed.  Not in distress.  No new symptoms. Wife outside the room.  All questions answered. Stable for discharge to SNF today.  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 Annamary Rummage Per operative note, clear margin at her left hallux amputation site.   Initially treated with broad-spectrum antibiotics, currently on oral Augmentin.  To continue for 7 days post discharge with probiotics Per podiatry recommendation, recommend WBAT on postop shoe bilateral.  Dressing to remain clean dry and intact.   To follow-up with podiatry Dr. Logan Bores as an outpatient next week 1/2 or 1/3. Continue pain control with oxycodone and Nucynta as before.  Type 2 diabetes mellitus A1c 5.8 on 10/11/2022 PTA meds-insulin NPH and Premeal Humalog. Currently on Semglee 30 units nightly along with SSI/Accu-Cheks.  Blood sugar level adequately controlled with current regimen.  I will recommend the same regimen post discharge. Recent Labs  Lab 02/02/23 0825 02/02/23 1212 02/02/23 1708 02/02/23 2012 02/03/23 0819  GLUCAP 107* 121* 186* 148* 152*   HTN Continue home metoprolol and amlodipine   H/o CVA, HLD Continue aspirin, Plavix and atorvastatin   BPH Continue finasteride   Depression Continue on duloxetine   Insomnia Continue trazodone at bedtime  Impaired mobility I discussed with patient's wife. He has MS and diabetic neuropathy.  Over the years, his mobility has impaired.  She states he has fallen so many times at home.  She has trying to help him and keep him home for as an as she could but she states she is not able to handle his level of care at this time.  She is expecting a meaningful recovery from his rehab stay   Goals of care   Code Status: Limited: Do not attempt resuscitation (DNR) -DNR-LIMITED -Do Not Intubate/DNI    Diet:  Diet Order  Diet Carb Modified           Diet Carb Modified Fluid consistency: Thin  Diet effective now                   Nutritional status:  Body mass index is 26.04 kg/m.       Wounds:  - Pressure Ulcer 04/21/13 Stage II -  Partial thickness loss of dermis presenting as a shallow open ulcer with a red, pink wound bed without slough. (Active)  Date First Assessed: 04/21/13   Location:  Chest  Location Orientation: Left  Staging: Stage II -  Partial thickness loss of dermis presenting as a shallow open ulcer with a red, pink wound bed without slough.  Present on Admission: Yes    Assessments 04/21/2013  7:45 PM 04/22/2013 10:48 AM  Dressing Type Gauze (Comment) Gauze (Comment)  Dressing Clean;Dry;Intact --  Drainage Amount -- Minimal  Drainage Description -- No odor;Sanguineous  Treatment -- Cleansed     No associated orders.     Wound / Incision (Open or Dehisced) 04/21/13 Other (Comment) Neck Left scabbed abrasion (Active)  Date First Assessed: 04/21/13   Wound Type: (c) Other (Comment)  Location: Neck  Location Orientation: Left  Wound Description (Comments): scabbed abrasion  Present on Admission: Yes    Assessments 04/21/2013  7:45 PM 04/22/2013  8:00 AM  Dressing Type Other (Comment);Gauze (Comment) Other (Comment);Gauze (Comment)  Dressing Changed New Other (Comment)  Dressing Status Clean;Dry;Intact Clean;Dry;Intact  Dressing Change Frequency Daily Daily  Site / Wound Assessment Clean;Dry Clean;Dry  % Wound base Black/Eschar 100% --     No associated orders.     Incision (Closed) 01/30/23 Leg Left (Active)  Date First Assessed/Time First Assessed: 01/30/23 1612   Location: Leg  Location Orientation: Left    Assessments 01/30/2023  4:32 PM 02/03/2023  8:30 AM  Dressing Type Gauze (Comment);Compression wrap Gauze (Comment)  Dressing Clean, Dry, Intact Intact;Old drainage (marked)  Site / Wound Assessment Clean;Dry;Dressing in place / Unable to assess Dressing in place / Unable to assess  Drainage Amount None Scant     No associated orders.    Discharge Exam:   Vitals:   02/02/23 1540 02/02/23 2009 02/03/23 0357 02/03/23 0741  BP: (!) 120/56 (!) 149/85 119/71 137/77  Pulse: 67 72 64 63  Resp: 18 18 18 16   Temp: 99 F (37.2 C) 98.2 F (36.8 C) 98.1 F (36.7 C) 98.2 F (36.8 C)  TempSrc: Oral Oral Oral Oral  SpO2: 95% 96% 95% 93%  Weight:       Height:        Body mass index is 26.04 kg/m.   General exam: Pleasant, elderly [place] male.  Not in distress Skin: No rashes, lesions or ulcers. HEENT: Atraumatic, normocephalic, no obvious bleeding Lungs: Clear to auscultation bilaterally,  CVS: S1, S2, no murmur,   GI/Abd: Soft, nontender, nondistended, bowel sound present,   CNS: Alert, awake, oriented to place and person Psychiatry: Upset that he has to go to SNF and cannot return home yet Extremities: No pedal edema, no calf tenderness, left foot in bandage..   Follow ups:    Contact information for follow-up providers     Felecia Shelling, DPM Follow up.   Specialty: Podiatry Contact information: [address] [place] [address] [phone]              Contact information for after-discharge care     Destination  [facility] .   Service: Skilled Nursing Contact information: [address] [place] Address [place] [address] [phone]                     Discharge Instructions:   Discharge Instructions     Call MD for:  difficulty breathing, headache or visual disturbances   Complete by: As directed    Call MD for:  extreme fatigue   Complete by: As directed    Call MD for:  hives   Complete by: As directed    Call MD for:  persistant dizziness or light-headedness   Complete by: As directed    Call MD for:  persistant nausea and vomiting   Complete by: As directed    Call MD for:  severe uncontrolled pain   Complete by: As directed    Call MD for:  temperature >100.4   Complete by: As directed    Diet Carb Modified   Complete by: As directed    Increase activity slowly   Complete by: As directed        Discharge Medications:   Allergies as of 02/03/2023   No Known Allergies      Medication List     STOP taking these medications    doxycycline 100 MG tablet Commonly known as: VIBRA-TABS   HumuLIN N KwikPen  100 UNIT/ML Kiwkpen Generic drug: Insulin NPH (Human) (Isophane)   nitroGLYCERIN 0.4 mg/hr patch Commonly known as: Nitro-Dur       TAKE these medications    acetaminophen 500 MG tablet Commonly known as: TYLENOL Take 2 tablets (1,000 mg total) by mouth every 8 (eight) hours for 7 days.   amLODipine 5 MG tablet Commonly known as: NORVASC Take 5 mg by mouth every morning.   amoxicillin-clavulanate 875-125 MG tablet Commonly known as: AUGMENTIN Take 1 tablet by mouth every 12 (twelve) hours for 7 days.   aspirin 81 MG tablet Take 81 mg by mouth daily.   atorvastatin 10 MG tablet Commonly known as: LIPITOR Take 10 mg by mouth at bedtime.   bisacodyl 5 MG EC tablet Generic drug: bisacodyl Take 2 tablets (10 mg total) by mouth daily as needed for moderate constipation.   clopidogrel 75 MG tablet Commonly known as: PLAVIX Take 75 mg by mouth daily.   DULoxetine 60 MG capsule Commonly known as: CYMBALTA Take 60 mg by mouth 2 (two) times daily.   finasteride 5 MG tablet Commonly known as: PROSCAR Take 5 mg by mouth every morning.   insulin aspart 100 UNIT/ML injection Commonly known as: novoLOG Inject 0-5 Units into the skin at bedtime.   insulin aspart 100 UNIT/ML injection Commonly known as: novoLOG Inject 0-9 Units into the skin 3 (three) times daily with meals.   insulin glargine-yfgn 100 UNIT/ML injection Commonly known as: SEMGLEE Inject 0.3 mLs (30 Units total) into the skin at bedtime.   lactobacillus acidophilus & bulgar chewable tablet Chew 1 tablet by mouth 3 (three) times daily with meals for 7 days.   metoprolol tartrate 25 MG tablet Commonly known as: LOPRESSOR Take 25 mg by mouth 2 (two) times daily.   Nucynta ER 200 MG Tb12 Generic drug: Tapentadol HCl Take 200 mg by mouth every 12 (twelve) hours for 3 days.   nystatin cream-hydrocortisone cream-zinc oxide Apply topically 3 (three) times daily.   ondansetron 4 MG tablet Commonly known  as: ZOFRAN Take 1 tablet (4 mg total) by mouth every 6 (six)  hours as needed for nausea.   oxyCODONE 15 MG immediate release tablet Commonly known as: ROXICODONE Take 1 tablet (15 mg total) by mouth every 6 (six) hours as needed for up to 3 days. What changed:  when to take this reasons to take this   polyethylene glycol powder 17 GM/SCOOP powder Commonly known as: GLYCOLAX/MIRALAX Take 17 g by mouth 2 (two) times daily. What changed: when to take this   Senna-Plus 8.6-50 MG tablet Generic drug: senna-docusate Take 1 tablet by mouth 2 (two) times daily.   traZODone 150 MG tablet Commonly known as: DESYREL Take 150 mg by mouth at bedtime.         The results of significant diagnostics from this hospitalization (including imaging, microbiology, ancillary and laboratory) are listed below for reference.    Procedures and Diagnostic Studies:   DG Foot 2 Views Left Result Date: 01/30/2023 CLINICAL DATA:  Postoperative state. EXAM: LEFT FOOT - 2 VIEW COMPARISON:  Left foot radiograph dated 01/30/2023. FINDINGS: Status post amputation of the phalanges of the great toe. No acute fracture or dislocation. The bones are osteopenic. There is pes planus. Degenerative changes and ankylosis of the midfoot. The soft tissues are grossly unremarkable. Overlying dressing noted. IMPRESSION: Status post amputation of the phalanges of the great toe. Electronically Signed   By: Elgie Collard M.D.   On: 01/30/2023 20:52   DG Foot Complete Left Result Date: 01/30/2023 Please see detailed radiograph report in office note.    Labs:   Basic Metabolic Panel: Recent Labs  Lab 01/30/23 1334 01/31/23 0605 02/01/23 0557 02/03/23 0522  NA 138 132* 136 138  K 4.4 4.9 4.2 4.1  CL 101 99 104 102  CO2 31 25 25 27   GLUCOSE 89 364* 159* 150*  BUN 19 21 24* 21  CREATININE 1.36* 1.41* 1.40* 1.37*  CALCIUM 9.0 8.0* 8.2* 8.2*   GFR Estimated Creatinine Clearance: 51.1 mL/min (A) (by C-G formula  based on SCr of 1.37 mg/dL (H)). Liver Function Tests: Recent Labs  Lab 01/30/23 1334  AST 23  ALT 15  ALKPHOS 229*  BILITOT 0.7  PROT 7.7  ALBUMIN 2.8*   No results for input(s): "LIPASE", "AMYLASE" in the last 168 hours. No results for input(s): "AMMONIA" in the last 168 hours. Coagulation profile No results for input(s): "INR", "PROTIME" in the last 168 hours.  CBC: Recent Labs  Lab 01/30/23 1334 01/31/23 0605 02/01/23 0557 02/03/23 0522  WBC 8.4 8.1 7.4 6.0  NEUTROABS 5.3  --  4.6 3.5  HGB 15.4 14.4 13.0 12.9*  HCT 46.8 41.6 37.6* 37.9*  MCV 102.2* 99.3 98.7 100.3*  PLT 133* 100* 90* 79*   Cardiac Enzymes: No results for input(s): "CKTOTAL", "CKMB", "CKMBINDEX", "TROPONINI" in the last 168 hours. BNP: Invalid input(s): "POCBNP" CBG: Recent Labs  Lab 02/02/23 0825 02/02/23 1212 02/02/23 1708 02/02/23 2012 02/03/23 0819  GLUCAP 107* 121* 186* 148* 152*   D-Dimer No results for input(s): "DDIMER" in the last 72 hours. Hgb A1c No results for input(s): "HGBA1C" in the last 72 hours. Lipid Profile No results for input(s): "CHOL", "HDL", "LDLCALC", "TRIG", "CHOLHDL", "LDLDIRECT" in the last 72 hours. Thyroid function studies No results for input(s): "TSH", "T4TOTAL", "T3FREE", "THYROIDAB" in the last 72 hours.  Invalid input(s): "FREET3" Anemia work up No results for input(s): "VITAMINB12", "FOLATE", "FERRITIN", "TIBC", "IRON", "RETICCTPCT" in the last 72 hours. Microbiology Recent Results (from the past 240 hours)  Culture, blood (Routine X 2) w Reflex to ID Panel  Status: None (Preliminary result)   Collection Time: 01/30/23  1:27 PM   Specimen: BLOOD RIGHT ARM  Result Value Ref Range Status   Specimen Description BLOOD RIGHT ARM  Final   Special Requests   Final    BOTTLES DRAWN AEROBIC AND ANAEROBIC Blood Culture adequate volume   Culture   Final    NO GROWTH 4 DAYS Performed at [facility], [address], [place], [place] [address]     Report Status PENDING  Incomplete  Culture, blood (Routine X 2) w Reflex to ID Panel     Status: None (Preliminary result)   Collection Time: 01/30/23  1:34 PM   Specimen: BLOOD LEFT HAND  Result Value Ref Range Status   Specimen Description BLOOD LEFT HAND  Final   Special Requests   Final    BOTTLES DRAWN AEROBIC AND ANAEROBIC Blood Culture adequate volume   Culture   Final    NO GROWTH 4 DAYS Performed at [facility], [address], [place], [place] [address]    Report Status PENDING  Incomplete  Aerobic/Anaerobic Culture w Gram Stain (surgical/deep wound)     Status: None (Preliminary result)   Collection Time: 01/30/23  4:08 PM   Specimen: PATH Digit amputation; Tissue  Result Value Ref Range Status   Specimen Description BONE LEFT  Final   Special Requests NONE  Final   Gram Stain   Final    FEW WBC PRESENT,BOTH PMN AND MONONUCLEAR RARE GRAM POSITIVE COCCI IN PAIRS Performed at [facility], [address] [place], [place] [address]    Culture   Final    FEW STAPHYLOCOCCUS AUREUS NO ANAEROBES ISOLATED; CULTURE IN PROGRESS FOR 5 DAYS    Report Status PENDING  Incomplete   Organism ID, Bacteria STAPHYLOCOCCUS AUREUS  Final      Susceptibility   Staphylococcus aureus - MIC*    CIPROFLOXACIN <=0.5 SENSITIVE Sensitive     ERYTHROMYCIN >=8 RESISTANT Resistant     GENTAMICIN <=0.5 SENSITIVE Sensitive     OXACILLIN 0.5 SENSITIVE Sensitive     TETRACYCLINE >=16 RESISTANT Resistant     VANCOMYCIN <=0.5 SENSITIVE Sensitive     TRIMETH/SULFA <=10 SENSITIVE Sensitive     CLINDAMYCIN >=8 RESISTANT Resistant     RIFAMPIN <=0.5 SENSITIVE Sensitive     Inducible Clindamycin NEGATIVE Sensitive     LINEZOLID 2 SENSITIVE Sensitive     * FEW STAPHYLOCOCCUS AUREUS  Acid Fast Smear (AFB)     Status: None   Collection Time: 01/30/23  4:08 PM   Specimen: PATH Digit amputation; Tissue  Result Value Ref Range Status   AFB Specimen Processing Concentration  Final   Acid Fast  Smear Negative  Final    Comment: (NOTE) Performed At: [facility] [address] [place], [place] [id number] Jolene Schimke MD FB:[phone]    Source (AFB) TOE  Final    Comment: LEFT Performed at [facility], [address] [place], [place] [address]   Aerobic/Anaerobic Culture w Gram Stain (surgical/deep wound)     Status: None (Preliminary result)   Collection Time: 01/30/23  4:17 PM   Specimen: PATH Digit amputation; Tissue  Result Value Ref Range Status   Specimen Description TOE LEFT  Final   Special Requests NONE  Final   Gram Stain   Final    RARE WBC PRESENT, PREDOMINANTLY MONONUCLEAR NO ORGANISMS SEEN Performed at [facility], [address], [place], [place] [address]  Culture   Final    MODERATE STAPHYLOCOCCUS AUREUS NO ANAEROBES ISOLATED; CULTURE IN PROGRESS FOR 5 DAYS    Report Status PENDING  Incomplete   Organism ID, Bacteria STAPHYLOCOCCUS AUREUS  Final      Susceptibility   Staphylococcus aureus - MIC*    CIPROFLOXACIN <=0.5 SENSITIVE Sensitive     ERYTHROMYCIN >=8 RESISTANT Resistant     GENTAMICIN <=0.5 SENSITIVE Sensitive     OXACILLIN <=0.25 SENSITIVE Sensitive     TETRACYCLINE >=16 RESISTANT Resistant     VANCOMYCIN <=0.5 SENSITIVE Sensitive     TRIMETH/SULFA <=10 SENSITIVE Sensitive     CLINDAMYCIN >=8 RESISTANT Resistant     RIFAMPIN <=0.5 SENSITIVE Sensitive     Inducible Clindamycin NEGATIVE Sensitive     LINEZOLID 2 SENSITIVE Sensitive     * MODERATE STAPHYLOCOCCUS AUREUS    Time coordinating discharge: 45 minutes  Signed: Kleo Dungee  [facility] 02/03/2023, 10:10 AM

## 2023-02-03 NOTE — TOC Transition Note (Signed)
Transition of Care [facility]) - Discharge Note   Patient Details  Name: [name] Mcnabb Blane Jr. MRN: [id number] Date of Birth: 1945/02/12  [facility] CM/SW Contact:  Carley Hammed, LCSW Phone Number: 02/03/2023, 10:14 AM   Clinical Narrative:    Pt to be transported to [facility] via [facility]. Nurse to call report to [phone]   Final next level of care: Skilled Nursing Facility Barriers to Discharge: Barriers Resolved   Patient Goals and CMS Choice Patient states their goals for this hospitalization and ongoing recovery are:: SNF          Discharge Placement              Patient chooses bed at: [facility] Patient to be transferred to facility by: [facility] Name of family member notified: Eber Jones Patient and family notified of of transfer: 02/03/23  Discharge Plan and Services Additional resources added to the After Visit Summary for   In-house Referral: Clinical Social Work                                   Social Drivers of Health (SDOH) Interventions SDOH Screenings   Food Insecurity: No Food Insecurity (01/30/2023)  Housing: Low Risk  (01/30/2023)  Transportation Needs: No Transportation Needs (01/30/2023)  Utilities: Not At Risk (01/30/2023)  Tobacco Use: Medium Risk (01/30/2023)     Readmission Risk Interventions     No data to display

## 2023-02-03 NOTE — Progress Notes (Signed)
AVS, signed printed prescriptions, social worker's paperwork, and signed gold DNR form placed in discharge packet.   Unsuccessful to call report to [facility] x3.   [facility] to transport pt with all belongings.

## 2023-02-03 NOTE — Progress Notes (Signed)
1336: Lisette Abu, LPN from [facility] called this RN for report on pt. All questions answered to satisfaction.

## 2023-02-04 LAB — AEROBIC/ANAEROBIC CULTURE W GRAM STAIN (SURGICAL/DEEP WOUND)

## 2023-02-04 LAB — CULTURE, BLOOD (ROUTINE X 2)
Culture: NO GROWTH
Culture: NO GROWTH
Special Requests: ADEQUATE
Special Requests: ADEQUATE

## 2023-02-06 ENCOUNTER — Encounter: Payer: Medicare Other | Admitting: Podiatry

## 2023-02-06 ENCOUNTER — Encounter: Payer: BLUE CROSS/BLUE SHIELD | Admitting: Podiatry

## 2023-02-06 DIAGNOSIS — F339 Major depressive disorder, recurrent, unspecified: Secondary | ICD-10-CM | POA: Diagnosis not present

## 2023-02-06 DIAGNOSIS — E785 Hyperlipidemia, unspecified: Secondary | ICD-10-CM | POA: Diagnosis not present

## 2023-02-06 DIAGNOSIS — E11621 Type 2 diabetes mellitus with foot ulcer: Secondary | ICD-10-CM | POA: Diagnosis not present

## 2023-02-06 DIAGNOSIS — L97518 Non-pressure chronic ulcer of other part of right foot with other specified severity: Secondary | ICD-10-CM | POA: Diagnosis not present

## 2023-02-07 ENCOUNTER — Inpatient Hospital Stay (HOSPITAL_COMMUNITY)
Admission: EM | Admit: 2023-02-07 | Discharge: 2023-02-13 | DRG: 638 | Disposition: A | Discharge status: Skilled Nursing Facility | Payer: Medicare Other | Source: Skilled Nursing Facility | Attending: Internal Medicine | Admitting: Internal Medicine

## 2023-02-07 ENCOUNTER — Encounter (HOSPITAL_COMMUNITY): Payer: Self-pay | Admitting: Emergency Medicine

## 2023-02-07 ENCOUNTER — Other Ambulatory Visit: Payer: Self-pay

## 2023-02-07 DIAGNOSIS — R278 Other lack of coordination: Secondary | ICD-10-CM | POA: Diagnosis not present

## 2023-02-07 DIAGNOSIS — N400 Enlarged prostate without lower urinary tract symptoms: Secondary | ICD-10-CM | POA: Diagnosis present

## 2023-02-07 DIAGNOSIS — G894 Chronic pain syndrome: Secondary | ICD-10-CM | POA: Diagnosis present

## 2023-02-07 DIAGNOSIS — M86572 Other chronic hematogenous osteomyelitis, left ankle and foot: Secondary | ICD-10-CM | POA: Diagnosis not present

## 2023-02-07 DIAGNOSIS — E10610 Type 1 diabetes mellitus with diabetic neuropathic arthropathy: Secondary | ICD-10-CM | POA: Diagnosis present

## 2023-02-07 DIAGNOSIS — R739 Hyperglycemia, unspecified: Secondary | ICD-10-CM | POA: Diagnosis not present

## 2023-02-07 DIAGNOSIS — Z7982 Long term (current) use of aspirin: Secondary | ICD-10-CM

## 2023-02-07 DIAGNOSIS — G8929 Other chronic pain: Secondary | ICD-10-CM | POA: Diagnosis not present

## 2023-02-07 DIAGNOSIS — Z8379 Family history of other diseases of the digestive system: Secondary | ICD-10-CM

## 2023-02-07 DIAGNOSIS — M8580 Other specified disorders of bone density and structure, unspecified site: Secondary | ICD-10-CM | POA: Diagnosis present

## 2023-02-07 DIAGNOSIS — I252 Old myocardial infarction: Secondary | ICD-10-CM

## 2023-02-07 DIAGNOSIS — E8809 Other disorders of plasma-protein metabolism, not elsewhere classified: Secondary | ICD-10-CM | POA: Diagnosis present

## 2023-02-07 DIAGNOSIS — I2510 Atherosclerotic heart disease of native coronary artery without angina pectoris: Secondary | ICD-10-CM | POA: Diagnosis not present

## 2023-02-07 DIAGNOSIS — N1832 Chronic kidney disease, stage 3b: Secondary | ICD-10-CM | POA: Diagnosis present

## 2023-02-07 DIAGNOSIS — E10649 Type 1 diabetes mellitus with hypoglycemia without coma: Secondary | ICD-10-CM | POA: Diagnosis not present

## 2023-02-07 DIAGNOSIS — E1042 Type 1 diabetes mellitus with diabetic polyneuropathy: Secondary | ICD-10-CM | POA: Diagnosis present

## 2023-02-07 DIAGNOSIS — Z794 Long term (current) use of insulin: Secondary | ICD-10-CM

## 2023-02-07 DIAGNOSIS — E1010 Type 1 diabetes mellitus with ketoacidosis without coma: Secondary | ICD-10-CM | POA: Diagnosis not present

## 2023-02-07 DIAGNOSIS — Z9841 Cataract extraction status, right eye: Secondary | ICD-10-CM

## 2023-02-07 DIAGNOSIS — Z66 Do not resuscitate: Secondary | ICD-10-CM | POA: Diagnosis present

## 2023-02-07 DIAGNOSIS — E663 Overweight: Secondary | ICD-10-CM | POA: Diagnosis present

## 2023-02-07 DIAGNOSIS — Z89412 Acquired absence of left great toe: Secondary | ICD-10-CM | POA: Diagnosis not present

## 2023-02-07 DIAGNOSIS — N179 Acute kidney failure, unspecified: Secondary | ICD-10-CM | POA: Diagnosis present

## 2023-02-07 DIAGNOSIS — E11621 Type 2 diabetes mellitus with foot ulcer: Secondary | ICD-10-CM | POA: Diagnosis not present

## 2023-02-07 DIAGNOSIS — Z7401 Bed confinement status: Secondary | ICD-10-CM | POA: Diagnosis not present

## 2023-02-07 DIAGNOSIS — R0902 Hypoxemia: Secondary | ICD-10-CM | POA: Diagnosis not present

## 2023-02-07 DIAGNOSIS — Z79899 Other long term (current) drug therapy: Secondary | ICD-10-CM

## 2023-02-07 DIAGNOSIS — Z9842 Cataract extraction status, left eye: Secondary | ICD-10-CM

## 2023-02-07 DIAGNOSIS — Z87891 Personal history of nicotine dependence: Secondary | ICD-10-CM

## 2023-02-07 DIAGNOSIS — E1111 Type 2 diabetes mellitus with ketoacidosis with coma: Secondary | ICD-10-CM | POA: Diagnosis not present

## 2023-02-07 DIAGNOSIS — R079 Chest pain, unspecified: Secondary | ICD-10-CM | POA: Diagnosis not present

## 2023-02-07 DIAGNOSIS — G4700 Insomnia, unspecified: Secondary | ICD-10-CM | POA: Diagnosis present

## 2023-02-07 DIAGNOSIS — S92332A Displaced fracture of third metatarsal bone, left foot, initial encounter for closed fracture: Secondary | ICD-10-CM | POA: Diagnosis not present

## 2023-02-07 DIAGNOSIS — G250 Essential tremor: Secondary | ICD-10-CM | POA: Diagnosis present

## 2023-02-07 DIAGNOSIS — F339 Major depressive disorder, recurrent, unspecified: Secondary | ICD-10-CM | POA: Diagnosis not present

## 2023-02-07 DIAGNOSIS — Z955 Presence of coronary angioplasty implant and graft: Secondary | ICD-10-CM

## 2023-02-07 DIAGNOSIS — I129 Hypertensive chronic kidney disease with stage 1 through stage 4 chronic kidney disease, or unspecified chronic kidney disease: Secondary | ICD-10-CM | POA: Diagnosis present

## 2023-02-07 DIAGNOSIS — G35 Multiple sclerosis: Secondary | ICD-10-CM | POA: Diagnosis present

## 2023-02-07 DIAGNOSIS — S90122A Contusion of left lesser toe(s) without damage to nail, initial encounter: Secondary | ICD-10-CM

## 2023-02-07 DIAGNOSIS — Z8249 Family history of ischemic heart disease and other diseases of the circulatory system: Secondary | ICD-10-CM

## 2023-02-07 DIAGNOSIS — N189 Chronic kidney disease, unspecified: Secondary | ICD-10-CM | POA: Diagnosis not present

## 2023-02-07 DIAGNOSIS — I69354 Hemiplegia and hemiparesis following cerebral infarction affecting left non-dominant side: Secondary | ICD-10-CM | POA: Diagnosis not present

## 2023-02-07 DIAGNOSIS — M79672 Pain in left foot: Secondary | ICD-10-CM | POA: Diagnosis not present

## 2023-02-07 DIAGNOSIS — E1022 Type 1 diabetes mellitus with diabetic chronic kidney disease: Secondary | ICD-10-CM | POA: Diagnosis present

## 2023-02-07 DIAGNOSIS — E1110 Type 2 diabetes mellitus with ketoacidosis without coma: Secondary | ICD-10-CM | POA: Diagnosis present

## 2023-02-07 DIAGNOSIS — T8789 Other complications of amputation stump: Secondary | ICD-10-CM | POA: Diagnosis present

## 2023-02-07 DIAGNOSIS — R2681 Unsteadiness on feet: Secondary | ICD-10-CM | POA: Diagnosis not present

## 2023-02-07 DIAGNOSIS — R471 Dysarthria and anarthria: Secondary | ICD-10-CM | POA: Diagnosis not present

## 2023-02-07 DIAGNOSIS — R2689 Other abnormalities of gait and mobility: Secondary | ICD-10-CM | POA: Diagnosis not present

## 2023-02-07 DIAGNOSIS — Z7902 Long term (current) use of antithrombotics/antiplatelets: Secondary | ICD-10-CM

## 2023-02-07 DIAGNOSIS — R457 State of emotional shock and stress, unspecified: Secondary | ICD-10-CM | POA: Diagnosis not present

## 2023-02-07 DIAGNOSIS — Y835 Amputation of limb(s) as the cause of abnormal reaction of the patient, or of later complication, without mention of misadventure at the time of the procedure: Secondary | ICD-10-CM | POA: Diagnosis present

## 2023-02-07 DIAGNOSIS — Z961 Presence of intraocular lens: Secondary | ICD-10-CM | POA: Diagnosis present

## 2023-02-07 DIAGNOSIS — R1311 Dysphagia, oral phase: Secondary | ICD-10-CM | POA: Diagnosis not present

## 2023-02-07 DIAGNOSIS — M6281 Muscle weakness (generalized): Secondary | ICD-10-CM | POA: Diagnosis not present

## 2023-02-07 DIAGNOSIS — M85872 Other specified disorders of bone density and structure, left ankle and foot: Secondary | ICD-10-CM | POA: Diagnosis not present

## 2023-02-07 DIAGNOSIS — E785 Hyperlipidemia, unspecified: Secondary | ICD-10-CM | POA: Diagnosis not present

## 2023-02-07 DIAGNOSIS — I10 Essential (primary) hypertension: Secondary | ICD-10-CM | POA: Diagnosis not present

## 2023-02-07 LAB — I-STAT CHEM 8, ED
BUN: 28 mg/dL — ABNORMAL HIGH (ref 8–23)
Calcium, Ion: 1.07 mmol/L — ABNORMAL LOW (ref 1.15–1.40)
Chloride: 99 mmol/L (ref 98–111)
Creatinine, Ser: 1.4 mg/dL — ABNORMAL HIGH (ref 0.61–1.24)
Glucose, Bld: 474 mg/dL — ABNORMAL HIGH (ref 70–99)
HCT: 41 % (ref 39.0–52.0)
Hemoglobin: 13.9 g/dL (ref 13.0–17.0)
Potassium: 4.9 mmol/L (ref 3.5–5.1)
Sodium: 133 mmol/L — ABNORMAL LOW (ref 135–145)
TCO2: 22 mmol/L (ref 22–32)

## 2023-02-07 LAB — CBC WITH DIFFERENTIAL/PLATELET
Abs Immature Granulocytes: 0.04 10*3/uL (ref 0.00–0.07)
Basophils Absolute: 0.1 10*3/uL (ref 0.0–0.1)
Basophils Relative: 1 %
Eosinophils Absolute: 0.3 10*3/uL (ref 0.0–0.5)
Eosinophils Relative: 2 %
HCT: 40.3 % (ref 39.0–52.0)
Hemoglobin: 13.2 g/dL (ref 13.0–17.0)
Immature Granulocytes: 0 %
Lymphocytes Relative: 17 %
Lymphs Abs: 1.8 10*3/uL (ref 0.7–4.0)
MCH: 33.6 pg (ref 26.0–34.0)
MCHC: 32.8 g/dL (ref 30.0–36.0)
MCV: 102.5 fL — ABNORMAL HIGH (ref 80.0–100.0)
Monocytes Absolute: 0.7 10*3/uL (ref 0.1–1.0)
Monocytes Relative: 7 %
Neutro Abs: 8 10*3/uL — ABNORMAL HIGH (ref 1.7–7.7)
Neutrophils Relative %: 73 %
Platelets: 119 10*3/uL — ABNORMAL LOW (ref 150–400)
RBC: 3.93 MIL/uL — ABNORMAL LOW (ref 4.22–5.81)
RDW: 13.6 % (ref 11.5–15.5)
WBC: 10.9 10*3/uL — ABNORMAL HIGH (ref 4.0–10.5)
nRBC: 0 % (ref 0.0–0.2)

## 2023-02-07 LAB — ACID FAST SMEAR (AFB, MYCOBACTERIA)

## 2023-02-07 LAB — ACID FAST CULTURE WITH REFLEXED SENSITIVITIES (MYCOBACTERIA)

## 2023-02-07 MED ORDER — SODIUM CHLORIDE 0.9 % IV BOLUS
1000.0000 mL | Freq: Once | INTRAVENOUS | Status: AC
  Administered 2023-02-07: 1000 mL via INTRAVENOUS

## 2023-02-07 MED ORDER — TAPENTADOL HCL ER 100 MG PO TB12
200.0000 mg | ORAL_TABLET | Freq: Once | ORAL | Status: AC
  Administered 2023-02-08: 200 mg via ORAL
  Filled 2023-02-07: qty 2

## 2023-02-07 NOTE — ED Triage Notes (Signed)
BIBA per EMS: Pt coming [facility] w/ c/o hyperglycemia. Reading HI on the monitor. Reports staff hasn't been giving his meds.

## 2023-02-07 NOTE — ED Provider Notes (Signed)
 [place] EMERGENCY DEPARTMENT AT [facility] Provider Note  CSN: [id number] Arrival date & time: 02/07/23 2236  Chief Complaint(s) Hyperglycemia BIBA per EMS: Pt coming from [facility] w/ c/o hyperglycemia. Reading HI on the monitor. Reports staff hasn't been giving his meds.   HPI [name]. is a 77 y.o. male with a past medical history listed below who presents to the emergency department from skilled nursing facility for elevated blood sugar levels.  Patient reportedly had change in recent insulin  dosage due to uncontrolled diabetes.  Apparently the new medication was not on hand and patient did not receive his meds today.  EMS reported that staff attempted to contact the facility physician earlier in the day but were unable to reach them.  Patient endorses worsening neuropathy which typically occurs with a low blood sugar levels.  Also notes chronic pain w/o acute changes. No other physical complaints.  The history is provided by the patient.    Past Medical History Past Medical History:  Diagnosis Date   Arthritis    Benign prostatic hypertrophy with hesitancy    Charcot's arthropathy, diabetic (HCC)    left foot   Chronic pain syndrome followed by dr mark phillps at pain [facility]   secondary to MS and diabetic neuropathy   Chronic periodontal disease    CKD (chronic kidney disease), stage II    hypertensive per PCP note   Coronary artery disease hx MI  s/p  DES to [facility] 2005   hx cardiologist-- dr morris until he retired,  now followed by pcp , dr shepard   Depression    Diabetic neuropathy [facility])    Essential tremor    Gait disorder    uses walker   History of CVA (cerebrovascular accident)    2011-  right subcortical cva--  residual left mininmal side weakness and no use of left head   History of CVA with residual deficit    2011  --  right subcortical cva w/ mininmal left hemiparesis   History of diabetic ulcer of foot    2010  left foot   History  of MI (myocardial infarction)    2005   Hypertension    dr shepard   MS (multiple sclerosis) (HCC)    dx 1985   PONV (postoperative nausea and vomiting)    S/P drug eluting coronary stent placement    2005  to midRCA   Type 1 diabetes mellitus on insulin  therapy [facility])    Patient Active Problem List   Diagnosis Date Noted   DKA, type 1 (HCC) 02/08/2023   Osteomyelitis of great toe of left foot (HCC) 01/30/2023   Chronic kidney disease, stage 3a (HCC) 10/10/2022   Diabetic ulcer of toe (HCC) 10/10/2022   Thrombocytopenia (HCC) 10/10/2022   Local infection of skin and subcutaneous tissue 09/21/2022   Chronic pain syndrome 04/01/2017   Chronic kidney disease, stage II (mild) 03/31/2017   Coronary artery disease involving native coronary artery 03/31/2017   Depression 03/31/2017   Fecal impaction (HCC) 03/31/2017   History of CVA (cerebrovascular accident) 03/31/2017   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 Authorizing Provider  acetaminophen  (TYLENOL ) 500 MG tablet Take 2 tablets (1,000 mg total) by mouth every 8 (eight) hours for 7 days. 02/03/23 02/10/23 Yes Dahal, Chapman, MD  amLODipine  (NORVASC ) 5 MG tablet Take 5 mg by mouth every morning.    Yes [provider]  amoxicillin -clavulanate (AUGMENTIN ) 875-125 MG tablet Take 1  tablet by mouth every 12 (twelve) hours for 7 days. 02/03/23 02/10/23 Yes Dahal, Chapman, MD  aspirin  81 MG tablet Take 81 mg by mouth daily.   Yes [provider]  atorvastatin  (LIPITOR) 10 MG tablet Take 10 mg by mouth at bedtime.   Yes [provider]  bisacodyl  (DULCOLAX) 5 MG EC tablet Take 2 tablets (10 mg total) by mouth daily as needed for moderate constipation. 10/14/22  Yes Sheikh, Omair Latif, DO  clopidogrel  (PLAVIX ) 75 MG tablet Take 75 mg by mouth daily.   Yes [provider]  DULoxetine  (CYMBALTA ) 60 MG capsule Take 60 mg by mouth 2 (two) times daily.   Yes [provider]  finasteride  (PROSCAR ) 5 MG tablet Take 5 mg by mouth every morning.    Yes [provider]  insulin  aspart (NOVOLOG ) 100 UNIT/ML injection Inject 4-16 Units into the skin 3 (three) times daily before meals. IF 111 - 150 = 4 Units 151 - 200 = 8 Units 201 - 250 = 10 Units 251 - 300 = 12 Units 301 - 350 = 14 Units 351 - 400 = 16 Units 400< = Call MD   Yes [provider]  insulin  glargine-yfgn (SEMGLEE ) 100 UNIT/ML injection Inject 0.3 mLs (30 Units total) into the skin at bedtime. 02/03/23  Yes Dahal, Chapman, MD  metoprolol  tartrate (LOPRESSOR ) 25 MG tablet Take 25 mg by mouth 2 (two) times daily.   Yes [provider]  ondansetron  (ZOFRAN ) 4 MG tablet Take 1 tablet (4 mg total) by mouth every 6 (six) hours as needed for nausea. 10/14/22  Yes Sheikh, Omair Latif, DO  oxyCODONE  (ROXICODONE ) 15 MG immediate release tablet Take 15 mg by mouth every 6 (six) hours as needed for pain.   Yes [provider]  Polyethylene Glycol 3350  (PEG 3350 ) 17 GM/SCOOP POWD Take 17 g by mouth 2 (two) times daily. 10/14/22  Yes Sheikh, Omair Latif, DO  senna-docusate (SENOKOT-S) 8.6-50 MG tablet Take 1 tablet by mouth 2 (two) times daily. 10/14/22  Yes Sheikh, Omair Latif, DO  traZODone  (DESYREL ) 150 MG tablet Take 150 mg by mouth at bedtime.   Yes [provider]   TUBERCULIN PPD ID Inject 0.1 mLs into the skin daily.   Yes [provider]  lactobacillus acidophilus & bulgar (LACTINEX) chewable tablet Chew 1 tablet by mouth 3 (three) times daily with meals for 7 days. 02/03/23 02/10/23  Arlice Chapman, MD  nystatin  cream-hydrocortisone  cream-zinc  oxide Apply topically 3 (three) times daily. 10/14/22   Sherrill Alejandro Donovan, DO  Allergies Patient has no known allergies.  Review of Systems Review of Systems As noted in HPI  Physical Exam Vital Signs  I have reviewed the triage vital signs BP (!) 121/53   Pulse 91   Temp (!) 97.5 F (36.4 C) (Oral)   Resp 18   SpO2 94%   Physical Exam Vitals reviewed.  Constitutional:      General: He is not in acute distress.    Appearance: He is well-developed. He is not diaphoretic.  HENT:     Head: Normocephalic and atraumatic.     Right Ear: External ear normal.     Left Ear: External ear normal.     Nose: Nose normal.     Mouth/Throat:     Mouth: Mucous membranes are moist.  Eyes:     General: No scleral icterus.    Conjunctiva/sclera: Conjunctivae normal.  Neck:     Trachea: Phonation normal.  Cardiovascular:     Rate and Rhythm: Normal rate and regular rhythm.  Pulmonary:     Effort: Pulmonary effort is normal. No respiratory distress.     Breath sounds: No stridor.  Abdominal:     General: There is no distension.  Musculoskeletal:        General: Normal range of motion.     Cervical back: Normal range of motion.     Comments: S/p left great toe amputation. Incision is C/D/I. Mild hyperemia and ecchymosis. No erythema or discharge noted.  Neurological:     Mental Status: He is alert and oriented to person, place, and time.  Psychiatric:        Behavior: Behavior normal.     ED Results and Treatments Labs (all labs ordered are listed, but only  abnormal results are displayed) Labs Reviewed  OSMOLALITY - Abnormal; Notable for the following components:      Result Value   Osmolality 316 (*)    All other components within normal limits  CBC WITH DIFFERENTIAL/PLATELET - Abnormal; Notable for the following components:   WBC 10.9 (*)    RBC 3.93 (*)    MCV 102.5 (*)    Platelets 119 (*)    Neutro Abs 8.0 (*)    All other components within normal limits  URINALYSIS, ROUTINE W REFLEX MICROSCOPIC - Abnormal; Notable for the following components:   Glucose, UA >=500 (*)    Ketones, ur 20 (*)    Protein, ur 30 (*)    All other components within normal limits  BETA-HYDROXYBUTYRIC ACID - Abnormal; Notable for the following components:   Beta-Hydroxybutyric Acid 5.04 (*)    All other components within normal limits  BETA-HYDROXYBUTYRIC ACID - Abnormal; Notable for the following components:   Beta-Hydroxybutyric Acid 7.01 (*)    All other components within normal limits  COMPREHENSIVE METABOLIC PANEL - Abnormal; Notable for the following components:   Sodium 130 (*)    Chloride 97 (*)    CO2 19 (*)    Glucose, Bld 480 (*)    BUN 28 (*)    Creatinine, Ser 1.58 (*)    Calcium  8.3 (*)    Albumin  3.1 (*)    Alkaline Phosphatase 195 (*)    Total Bilirubin 2.0 (*)    GFR, Estimated 45 (*)    All other components within normal limits  BLOOD GAS, VENOUS - Abnormal; Notable for the following components:   pCO2, Ven 40 (*)    Acid-base deficit 4.5 (*)    All other components within normal limits  BASIC METABOLIC PANEL - Abnormal; Notable for the following components:   Sodium 131 (*)    CO2 16 (*)    Glucose, Bld 353 (*)    BUN 32 (*)    Creatinine, Ser 1.52 (*)    Calcium  7.9 (*)    GFR, Estimated 47 (*)    All other components within normal limits  CBG MONITORING, ED - Abnormal; Notable for the following components:   Glucose-Capillary 457 (*)    All other components within normal limits  I-STAT CHEM 8, ED - Abnormal; Notable  for the following components:   Sodium 133 (*)    BUN 28 (*)    Creatinine, Ser 1.40 (*)    Glucose, Bld 474 (*)    Calcium , Ion 1.07 (*)    All other components within normal limits  CBG MONITORING, ED - Abnormal; Notable for the following components:   Glucose-Capillary 410 (*)    All other components within normal limits  MAGNESIUM   PHOSPHORUS  BETA-HYDROXYBUTYRIC ACID  BASIC METABOLIC PANEL  BASIC METABOLIC PANEL  BASIC METABOLIC PANEL  CBG MONITORING, ED  CBG MONITORING, ED  CBG MONITORING, ED  CBG MONITORING, ED  CBG MONITORING, ED  CBG MONITORING, ED  CBG MONITORING, ED  CBG MONITORING, ED                                                                                                                         EKG  EKG Interpretation Date/Time:    Ventricular Rate:    PR Interval:    QRS Duration:    QT Interval:    QTC Calculation:   R Axis:      Text Interpretation:         Radiology DG Foot Complete Left Result Date: 02/07/2023 Please see detailed radiograph report in office note.   Medications Ordered in ED Medications  insulin  regular, human (MYXREDLIN ) 100 units/ 100 mL infusion (13 Units/hr Intravenous New Bag/Given 02/08/23 0602)  lactated ringers  infusion ( Intravenous New Bag/Given 02/08/23 0558)  dextrose  5 % in lactated ringers  infusion (has no administration in time range)  dextrose  50 % solution 0-50 mL (has no administration in time range)  potassium chloride  10 mEq in 100 mL IVPB (10 mEq Intravenous New Bag/Given 02/08/23 0718)  enoxaparin  (LOVENOX ) injection 40 mg (has no administration in time range)  acetaminophen  (TYLENOL ) tablet 650 mg (has no administration in time range)  polyethylene glycol (MIRALAX  / GLYCOLAX ) packet 17 g (has no administration in time range)  melatonin tablet 5 mg (has no administration in time range)  prochlorperazine  (COMPAZINE ) injection 5 mg (has no administration in time range)  oxyCODONE  (Oxy IR/ROXICODONE )  immediate release tablet 15 mg (has no administration in time range)  finasteride  (PROSCAR ) tablet 5 mg (has no administration in time range)  aspirin  chewable tablet 81 mg (has no administration in time range)  amoxicillin -clavulanate (AUGMENTIN ) 875-125 MG per tablet 1 tablet (has no administration in time range)  atorvastatin  (LIPITOR)  tablet 10 mg (has no administration in time range)  lactobacillus (FLORANEX/LACTINEX) granules 1 g (has no administration in time range)  traZODone  (DESYREL ) tablet 150 mg (has no administration in time range)  senna-docusate (Senokot-S) tablet 1 tablet (has no administration in time range)  polyethylene glycol (MIRALAX  / GLYCOLAX ) packet 17 g (has no administration in time range)  DULoxetine  (CYMBALTA ) DR capsule 60 mg (has no administration in time range)  clopidogrel  (PLAVIX ) tablet 75 mg (has no administration in time range)  metoprolol  tartrate (LOPRESSOR ) tablet 12.5 mg (has no administration in time range)  sodium chloride  0.9 % bolus 1,000 mL (0 mLs Intravenous Stopped 02/08/23 0142)  tapentadol  (NUCYNTA ) 12 hr tablet 200 mg (200 mg Oral Given 02/08/23 0006)  insulin  aspart (novoLOG ) injection 9 Units (9 Units Subcutaneous Given 02/08/23 0240)  oxyCODONE  (Oxy IR/ROXICODONE ) immediate release tablet 15 mg (15 mg Oral Given 02/08/23 0504)   Procedures .Critical Care  Performed by: Trine Raynell Moder, MD Authorized by: Trine Raynell Moder, MD   Critical care provider statement:    Critical care time (minutes):  46   Critical care was necessary to treat or prevent imminent or life-threatening deterioration of the following conditions:  Endocrine crisis and metabolic crisis   Critical care was time spent personally by me on the following activities:  Development of treatment plan with patient or surrogate, discussions with consultants, evaluation of patient's response to treatment, examination of patient, ordering and review of laboratory studies,  ordering and review of radiographic studies, ordering and performing treatments and interventions, pulse oximetry, re-evaluation of patient's condition and review of old charts   Care discussed with: admitting provider     (including critical care time) Medical Decision Making / ED Course   Medical Decision Making Amount and/or Complexity of Data Reviewed Labs: ordered. Decision-making details documented in ED Course. ECG/medicine tests: ordered and independent interpretation performed. Decision-making details documented in ED Course.  Risk Prescription drug management. Decision regarding hospitalization.    Workup is notable for early DKA.  Patient started on IV fluids and insulin  drip.  Admitted to medicine for further workup and management.    Final Clinical Impression(s) / ED Diagnoses Final diagnoses:  Diabetic ketoacidosis without coma associated with type 1 diabetes mellitus (HCC)    This chart was dictated using voice recognition software.  Despite best efforts to proofread,  errors can occur which can change the documentation meaning.    Trine Raynell Moder, MD 02/08/23 [id number]

## 2023-02-08 ENCOUNTER — Inpatient Hospital Stay (HOSPITAL_COMMUNITY): Payer: Medicare Other

## 2023-02-08 DIAGNOSIS — G8929 Other chronic pain: Secondary | ICD-10-CM | POA: Diagnosis not present

## 2023-02-08 DIAGNOSIS — F339 Major depressive disorder, recurrent, unspecified: Secondary | ICD-10-CM | POA: Diagnosis not present

## 2023-02-08 DIAGNOSIS — I69354 Hemiplegia and hemiparesis following cerebral infarction affecting left non-dominant side: Secondary | ICD-10-CM | POA: Diagnosis not present

## 2023-02-08 DIAGNOSIS — N189 Chronic kidney disease, unspecified: Secondary | ICD-10-CM | POA: Diagnosis not present

## 2023-02-08 DIAGNOSIS — G250 Essential tremor: Secondary | ICD-10-CM | POA: Diagnosis present

## 2023-02-08 DIAGNOSIS — R2681 Unsteadiness on feet: Secondary | ICD-10-CM | POA: Diagnosis not present

## 2023-02-08 DIAGNOSIS — M85872 Other specified disorders of bone density and structure, left ankle and foot: Secondary | ICD-10-CM | POA: Diagnosis not present

## 2023-02-08 DIAGNOSIS — R1311 Dysphagia, oral phase: Secondary | ICD-10-CM | POA: Diagnosis not present

## 2023-02-08 DIAGNOSIS — E785 Hyperlipidemia, unspecified: Secondary | ICD-10-CM | POA: Diagnosis not present

## 2023-02-08 DIAGNOSIS — E1111 Type 2 diabetes mellitus with ketoacidosis with coma: Secondary | ICD-10-CM | POA: Diagnosis not present

## 2023-02-08 DIAGNOSIS — E663 Overweight: Secondary | ICD-10-CM | POA: Diagnosis present

## 2023-02-08 DIAGNOSIS — E1022 Type 1 diabetes mellitus with diabetic chronic kidney disease: Secondary | ICD-10-CM | POA: Diagnosis present

## 2023-02-08 DIAGNOSIS — E1010 Type 1 diabetes mellitus with ketoacidosis without coma: Secondary | ICD-10-CM | POA: Diagnosis present

## 2023-02-08 DIAGNOSIS — E1110 Type 2 diabetes mellitus with ketoacidosis without coma: Secondary | ICD-10-CM | POA: Diagnosis present

## 2023-02-08 DIAGNOSIS — I129 Hypertensive chronic kidney disease with stage 1 through stage 4 chronic kidney disease, or unspecified chronic kidney disease: Secondary | ICD-10-CM | POA: Diagnosis present

## 2023-02-08 DIAGNOSIS — R278 Other lack of coordination: Secondary | ICD-10-CM | POA: Diagnosis not present

## 2023-02-08 DIAGNOSIS — Z7401 Bed confinement status: Secondary | ICD-10-CM | POA: Diagnosis not present

## 2023-02-08 DIAGNOSIS — Z89412 Acquired absence of left great toe: Secondary | ICD-10-CM | POA: Diagnosis not present

## 2023-02-08 DIAGNOSIS — Z79899 Other long term (current) drug therapy: Secondary | ICD-10-CM | POA: Diagnosis not present

## 2023-02-08 DIAGNOSIS — R2689 Other abnormalities of gait and mobility: Secondary | ICD-10-CM | POA: Diagnosis not present

## 2023-02-08 DIAGNOSIS — M86572 Other chronic hematogenous osteomyelitis, left ankle and foot: Secondary | ICD-10-CM | POA: Diagnosis not present

## 2023-02-08 DIAGNOSIS — N400 Enlarged prostate without lower urinary tract symptoms: Secondary | ICD-10-CM | POA: Diagnosis present

## 2023-02-08 DIAGNOSIS — N179 Acute kidney failure, unspecified: Secondary | ICD-10-CM | POA: Diagnosis present

## 2023-02-08 DIAGNOSIS — E1042 Type 1 diabetes mellitus with diabetic polyneuropathy: Secondary | ICD-10-CM | POA: Diagnosis present

## 2023-02-08 DIAGNOSIS — S92332A Displaced fracture of third metatarsal bone, left foot, initial encounter for closed fracture: Secondary | ICD-10-CM | POA: Diagnosis not present

## 2023-02-08 DIAGNOSIS — N1832 Chronic kidney disease, stage 3b: Secondary | ICD-10-CM | POA: Diagnosis present

## 2023-02-08 DIAGNOSIS — I10 Essential (primary) hypertension: Secondary | ICD-10-CM | POA: Diagnosis not present

## 2023-02-08 DIAGNOSIS — E10610 Type 1 diabetes mellitus with diabetic neuropathic arthropathy: Secondary | ICD-10-CM | POA: Diagnosis present

## 2023-02-08 DIAGNOSIS — Z66 Do not resuscitate: Secondary | ICD-10-CM | POA: Diagnosis present

## 2023-02-08 DIAGNOSIS — G4700 Insomnia, unspecified: Secondary | ICD-10-CM | POA: Diagnosis present

## 2023-02-08 DIAGNOSIS — Z794 Long term (current) use of insulin: Secondary | ICD-10-CM | POA: Diagnosis not present

## 2023-02-08 DIAGNOSIS — G35 Multiple sclerosis: Secondary | ICD-10-CM | POA: Diagnosis present

## 2023-02-08 DIAGNOSIS — R471 Dysarthria and anarthria: Secondary | ICD-10-CM | POA: Diagnosis not present

## 2023-02-08 DIAGNOSIS — I2510 Atherosclerotic heart disease of native coronary artery without angina pectoris: Secondary | ICD-10-CM | POA: Diagnosis present

## 2023-02-08 DIAGNOSIS — I252 Old myocardial infarction: Secondary | ICD-10-CM | POA: Diagnosis not present

## 2023-02-08 DIAGNOSIS — E8809 Other disorders of plasma-protein metabolism, not elsewhere classified: Secondary | ICD-10-CM | POA: Diagnosis present

## 2023-02-08 DIAGNOSIS — Y835 Amputation of limb(s) as the cause of abnormal reaction of the patient, or of later complication, without mention of misadventure at the time of the procedure: Secondary | ICD-10-CM | POA: Diagnosis present

## 2023-02-08 DIAGNOSIS — G894 Chronic pain syndrome: Secondary | ICD-10-CM | POA: Diagnosis present

## 2023-02-08 DIAGNOSIS — R0902 Hypoxemia: Secondary | ICD-10-CM | POA: Diagnosis not present

## 2023-02-08 DIAGNOSIS — E10649 Type 1 diabetes mellitus with hypoglycemia without coma: Secondary | ICD-10-CM | POA: Diagnosis not present

## 2023-02-08 DIAGNOSIS — T8789 Other complications of amputation stump: Secondary | ICD-10-CM | POA: Diagnosis present

## 2023-02-08 DIAGNOSIS — E11621 Type 2 diabetes mellitus with foot ulcer: Secondary | ICD-10-CM | POA: Diagnosis not present

## 2023-02-08 DIAGNOSIS — M79672 Pain in left foot: Secondary | ICD-10-CM | POA: Diagnosis not present

## 2023-02-08 DIAGNOSIS — M6281 Muscle weakness (generalized): Secondary | ICD-10-CM | POA: Diagnosis not present

## 2023-02-08 DIAGNOSIS — M8580 Other specified disorders of bone density and structure, unspecified site: Secondary | ICD-10-CM | POA: Diagnosis present

## 2023-02-08 LAB — CBG MONITORING, ED
Glucose-Capillary: 457 mg/dL — ABNORMAL HIGH (ref 70–99)
Glucose-Capillary: 410 mg/dL — ABNORMAL HIGH (ref 70–99)
Glucose-Capillary: 240 mg/dL — ABNORMAL HIGH (ref 70–99)
Glucose-Capillary: 193 mg/dL — ABNORMAL HIGH (ref 70–99)
Glucose-Capillary: 142 mg/dL — ABNORMAL HIGH (ref 70–99)
Glucose-Capillary: 138 mg/dL — ABNORMAL HIGH (ref 70–99)
Glucose-Capillary: 138 mg/dL — ABNORMAL HIGH (ref 70–99)
Glucose-Capillary: 127 mg/dL — ABNORMAL HIGH (ref 70–99)
Glucose-Capillary: 115 mg/dL — ABNORMAL HIGH (ref 70–99)
Glucose-Capillary: 147 mg/dL — ABNORMAL HIGH (ref 70–99)
Glucose-Capillary: 159 mg/dL — ABNORMAL HIGH (ref 70–99)
Glucose-Capillary: 148 mg/dL — ABNORMAL HIGH (ref 70–99)
Glucose-Capillary: 133 mg/dL — ABNORMAL HIGH (ref 70–99)
Glucose-Capillary: 157 mg/dL — ABNORMAL HIGH (ref 70–99)

## 2023-02-08 LAB — URINALYSIS, ROUTINE W REFLEX MICROSCOPIC
Bacteria, UA: NONE SEEN
Bilirubin Urine: NEGATIVE
Glucose, UA: >=500 mg/dL — AB
Hgb urine dipstick: NEGATIVE
Ketones, ur: 20 mg/dL — AB
Leukocytes,Ua: NEGATIVE
Nitrite: NEGATIVE
Protein, ur: 30 mg/dL — AB
Specific Gravity, Urine: 1.018 (ref 1.005–1.030)
pH: 5 (ref 5.0–8.0)

## 2023-02-08 LAB — COMPREHENSIVE METABOLIC PANEL
ALT: 41 U/L (ref 0–44)
AST: 37 U/L (ref 15–41)
Albumin: 3.1 g/dL — ABNORMAL LOW (ref 3.5–5.0)
Alkaline Phosphatase: 195 U/L — ABNORMAL HIGH (ref 38–126)
Anion gap: 14 (ref 5–15)
BUN: 28 mg/dL — ABNORMAL HIGH (ref 8–23)
CO2: 19 mmol/L — ABNORMAL LOW (ref 22–32)
Calcium: 8.3 mg/dL — ABNORMAL LOW (ref 8.9–10.3)
Chloride: 97 mmol/L — ABNORMAL LOW (ref 98–111)
Creatinine, Ser: 1.58 mg/dL — ABNORMAL HIGH (ref 0.61–1.24)
GFR, Estimated: 45 mL/min — ABNORMAL LOW (ref >60)
Glucose, Bld: 480 mg/dL — ABNORMAL HIGH (ref 70–99)
Potassium: 4.6 mmol/L (ref 3.5–5.1)
Sodium: 130 mmol/L — ABNORMAL LOW (ref 135–145)
Total Bilirubin: 2 mg/dL — ABNORMAL HIGH (ref 0.0–1.2)
Total Protein: 7.6 g/dL (ref 6.5–8.1)

## 2023-02-08 LAB — BLOOD GAS, VENOUS
Acid-base deficit: 4.5 mmol/L — ABNORMAL HIGH (ref 0.0–2.0)
Bicarbonate: 21.1 mmol/L (ref 20.0–28.0)
Drawn by: 5893
O2 Saturation: 53.2 %
Patient temperature: 37
pCO2, Ven: 40 mm[Hg] — ABNORMAL LOW (ref 44–60)
pH, Ven: 7.33 (ref 7.25–7.43)
pO2, Ven: 33 mm[Hg] (ref 32–45)

## 2023-02-08 LAB — BASIC METABOLIC PANEL
Anion gap: 15 (ref 5–15)
Anion gap: 5 (ref 5–15)
Anion gap: 5 (ref 5–15)
BUN: 32 mg/dL — ABNORMAL HIGH (ref 8–23)
BUN: 27 mg/dL — ABNORMAL HIGH (ref 8–23)
BUN: 23 mg/dL (ref 8–23)
CO2: 16 mmol/L — ABNORMAL LOW (ref 22–32)
CO2: 24 mmol/L (ref 22–32)
CO2: 25 mmol/L (ref 22–32)
Calcium: 7.9 mg/dL — ABNORMAL LOW (ref 8.9–10.3)
Calcium: 7.9 mg/dL — ABNORMAL LOW (ref 8.9–10.3)
Calcium: 8.1 mg/dL — ABNORMAL LOW (ref 8.9–10.3)
Chloride: 100 mmol/L (ref 98–111)
Chloride: 103 mmol/L (ref 98–111)
Chloride: 104 mmol/L (ref 98–111)
Creatinine, Ser: 1.52 mg/dL — ABNORMAL HIGH (ref 0.61–1.24)
Creatinine, Ser: 1.2 mg/dL (ref 0.61–1.24)
Creatinine, Ser: 1.06 mg/dL (ref 0.61–1.24)
GFR, Estimated: 47 mL/min — ABNORMAL LOW (ref >60)
GFR, Estimated: >60 mL/min (ref >60)
GFR, Estimated: >60 mL/min (ref >60)
Glucose, Bld: 353 mg/dL — ABNORMAL HIGH (ref 70–99)
Glucose, Bld: 136 mg/dL — ABNORMAL HIGH (ref 70–99)
Glucose, Bld: 127 mg/dL — ABNORMAL HIGH (ref 70–99)
Potassium: 4.1 mmol/L (ref 3.5–5.1)
Potassium: 3.6 mmol/L (ref 3.5–5.1)
Potassium: 3.7 mmol/L (ref 3.5–5.1)
Sodium: 131 mmol/L — ABNORMAL LOW (ref 135–145)
Sodium: 132 mmol/L — ABNORMAL LOW (ref 135–145)
Sodium: 134 mmol/L — ABNORMAL LOW (ref 135–145)

## 2023-02-08 LAB — BETA-HYDROXYBUTYRIC ACID
Beta-Hydroxybutyric Acid: 5.04 mmol/L — ABNORMAL HIGH (ref 0.05–0.27)
Beta-Hydroxybutyric Acid: 7.01 mmol/L — ABNORMAL HIGH (ref 0.05–0.27)
Beta-Hydroxybutyric Acid: 0.87 mmol/L — ABNORMAL HIGH (ref 0.05–0.27)
Beta-Hydroxybutyric Acid: 0.28 mmol/L — ABNORMAL HIGH (ref 0.05–0.27)

## 2023-02-08 LAB — OSMOLALITY: Osmolality: 316 mosm/kg — ABNORMAL HIGH (ref 275–295)

## 2023-02-08 LAB — MAGNESIUM: Magnesium: 2.1 mg/dL (ref 1.7–2.4)

## 2023-02-08 LAB — PHOSPHORUS: Phosphorus: 3.6 mg/dL (ref 2.5–4.6)

## 2023-02-08 MED ORDER — INSULIN ASPART 100 UNIT/ML IJ SOLN
0.0000 [IU] | Freq: Three times a day (TID) | INTRAMUSCULAR | Status: DC
  Administered 2023-02-08: 2 [IU] via SUBCUTANEOUS
  Administered 2023-02-09: 11 [IU] via SUBCUTANEOUS
  Administered 2023-02-09: 2 [IU] via SUBCUTANEOUS
  Administered 2023-02-09: 15 [IU] via SUBCUTANEOUS
  Administered 2023-02-10: 5 [IU] via SUBCUTANEOUS
  Administered 2023-02-10: 3 [IU] via SUBCUTANEOUS
  Administered 2023-02-11: 5 [IU] via SUBCUTANEOUS
  Administered 2023-02-12: 2 [IU] via SUBCUTANEOUS
  Administered 2023-02-12: 5 [IU] via SUBCUTANEOUS
  Administered 2023-02-12: 2 [IU] via SUBCUTANEOUS
  Filled 2023-02-08: qty 0.15

## 2023-02-08 MED ORDER — INSULIN GLARGINE-YFGN 100 UNIT/ML ~~LOC~~ SOLN
10.0000 [IU] | Freq: Every day | SUBCUTANEOUS | Status: DC
  Administered 2023-02-08: 10 [IU] via SUBCUTANEOUS
  Filled 2023-02-08 (×2): qty 0.1

## 2023-02-08 MED ORDER — PROCHLORPERAZINE EDISYLATE 10 MG/2ML IJ SOLN: 5.0000 mg | Freq: Four times a day (QID) | INTRAMUSCULAR | Status: DC | PRN

## 2023-02-08 MED ORDER — POLYETHYLENE GLYCOL 3350 17 G PO PACK: 17.0000 g | PACK | Freq: Every day | ORAL | Status: DC | PRN

## 2023-02-08 MED ORDER — LACTATED RINGERS IV SOLN: INTRAVENOUS | Status: AC

## 2023-02-08 MED ORDER — POTASSIUM CHLORIDE 10 MEQ/100ML IV SOLN
10.0000 meq | INTRAVENOUS | Status: AC
  Administered 2023-02-08 (×2): 10 meq via INTRAVENOUS
  Filled 2023-02-08: qty 100

## 2023-02-08 MED ORDER — ACETAMINOPHEN 325 MG PO TABS: 650.0000 mg | ORAL_TABLET | Freq: Four times a day (QID) | ORAL | Status: DC | PRN

## 2023-02-08 MED ORDER — MELATONIN 5 MG PO TABS
5.0000 mg | ORAL_TABLET | Freq: Every evening | ORAL | Status: DC | PRN
  Administered 2023-02-12: 5 mg via ORAL
  Filled 2023-02-08: qty 1

## 2023-02-08 MED ORDER — INSULIN REGULAR(HUMAN) IN NACL 100-0.9 UT/100ML-% IV SOLN
INTRAVENOUS | Status: DC
  Administered 2023-02-08: 13 [IU]/h via INTRAVENOUS
  Filled 2023-02-08: qty 100

## 2023-02-08 MED ORDER — INSULIN ASPART 100 UNIT/ML IJ SOLN
0.0000 [IU] | Freq: Every day | INTRAMUSCULAR | Status: DC
  Administered 2023-02-11: 4 [IU] via SUBCUTANEOUS
  Filled 2023-02-08: qty 0.05

## 2023-02-08 MED ORDER — OXYCODONE HCL 5 MG PO TABS
15.0000 mg | ORAL_TABLET | Freq: Once | ORAL | Status: AC
  Administered 2023-02-08: 15 mg via ORAL
  Filled 2023-02-08: qty 3

## 2023-02-08 MED ORDER — DEXTROSE 50 % IV SOLN: 0.0000 mL | INTRAVENOUS | Status: DC | PRN

## 2023-02-08 MED ORDER — DULOXETINE HCL 60 MG PO CPEP
60.0000 mg | ORAL_CAPSULE | Freq: Two times a day (BID) | ORAL | Status: DC
  Administered 2023-02-08 – 2023-02-13 (×11): 60 mg via ORAL
  Filled 2023-02-08: qty 2
  Filled 2023-02-08 (×2): qty 1
  Filled 2023-02-08: qty 2
  Filled 2023-02-08 (×4): qty 1
  Filled 2023-02-08: qty 2
  Filled 2023-02-08 (×2): qty 1

## 2023-02-08 MED ORDER — CLOPIDOGREL BISULFATE 75 MG PO TABS
75.0000 mg | ORAL_TABLET | Freq: Every day | ORAL | Status: DC
  Administered 2023-02-08 – 2023-02-13 (×6): 75 mg via ORAL
  Filled 2023-02-08 (×6): qty 1

## 2023-02-08 MED ORDER — METOPROLOL TARTRATE 25 MG PO TABS
12.5000 mg | ORAL_TABLET | Freq: Two times a day (BID) | ORAL | Status: DC
  Administered 2023-02-08 – 2023-02-13 (×11): 12.5 mg via ORAL
  Filled 2023-02-08 (×11): qty 1

## 2023-02-08 MED ORDER — FINASTERIDE 5 MG PO TABS
5.0000 mg | ORAL_TABLET | Freq: Every morning | ORAL | Status: DC
  Administered 2023-02-08 – 2023-02-13 (×6): 5 mg via ORAL
  Filled 2023-02-08 (×6): qty 1

## 2023-02-08 MED ORDER — POLYETHYLENE GLYCOL 3350 17 G PO PACK
17.0000 g | PACK | Freq: Two times a day (BID) | ORAL | Status: DC
  Administered 2023-02-08 – 2023-02-12 (×8): 17 g via ORAL
  Filled 2023-02-08 (×10): qty 1

## 2023-02-08 MED ORDER — OXYCODONE HCL 5 MG PO TABS
15.0000 mg | ORAL_TABLET | Freq: Four times a day (QID) | ORAL | Status: DC | PRN
  Administered 2023-02-08 – 2023-02-13 (×15): 15 mg via ORAL
  Filled 2023-02-08 (×15): qty 3

## 2023-02-08 MED ORDER — FLORANEX PO PACK
1.0000 g | PACK | Freq: Three times a day (TID) | ORAL | Status: DC
  Administered 2023-02-08 – 2023-02-13 (×15): 1 g via ORAL
  Filled 2023-02-08 (×18): qty 1

## 2023-02-08 MED ORDER — AMOXICILLIN-POT CLAVULANATE 875-125 MG PO TABS
1.0000 | ORAL_TABLET | Freq: Two times a day (BID) | ORAL | Status: AC
  Administered 2023-02-08 – 2023-02-10 (×6): 1 via ORAL
  Filled 2023-02-08 (×6): qty 1

## 2023-02-08 MED ORDER — INSULIN ASPART 100 UNIT/ML IJ SOLN
9.0000 [IU] | Freq: Once | INTRAMUSCULAR | Status: AC
  Administered 2023-02-08: 9 [IU] via SUBCUTANEOUS
  Filled 2023-02-08: qty 0.09

## 2023-02-08 MED ORDER — ENOXAPARIN SODIUM 40 MG/0.4ML IJ SOSY
40.0000 mg | PREFILLED_SYRINGE | INTRAMUSCULAR | Status: DC
  Administered 2023-02-08 – 2023-02-13 (×6): 40 mg via SUBCUTANEOUS
  Filled 2023-02-08 (×6): qty 0.4

## 2023-02-08 MED ORDER — DEXTROSE IN LACTATED RINGERS 5 % IV SOLN: INTRAVENOUS | Status: AC

## 2023-02-08 MED ORDER — SODIUM CHLORIDE 0.9 % IV BOLUS: 1000.0000 mL | Freq: Once | INTRAVENOUS | Status: DC

## 2023-02-08 MED ORDER — LACTINEX PO CHEW
1.0000 | CHEWABLE_TABLET | Freq: Three times a day (TID) | ORAL | Status: DC
  Filled 2023-02-08: qty 1

## 2023-02-08 MED ORDER — ASPIRIN 81 MG PO CHEW
81.0000 mg | CHEWABLE_TABLET | Freq: Every day | ORAL | Status: DC
  Administered 2023-02-08 – 2023-02-13 (×6): 81 mg via ORAL
  Filled 2023-02-08 (×6): qty 1

## 2023-02-08 MED ORDER — SENNOSIDES-DOCUSATE SODIUM 8.6-50 MG PO TABS
1.0000 | ORAL_TABLET | Freq: Two times a day (BID) | ORAL | Status: DC
  Administered 2023-02-08 – 2023-02-13 (×9): 1 via ORAL
  Filled 2023-02-08 (×10): qty 1

## 2023-02-08 MED ORDER — ATORVASTATIN CALCIUM 10 MG PO TABS
10.0000 mg | ORAL_TABLET | Freq: Every day | ORAL | Status: DC
  Administered 2023-02-08 – 2023-02-12 (×5): 10 mg via ORAL
  Filled 2023-02-08 (×5): qty 1

## 2023-02-08 MED ORDER — LACTATED RINGERS IV BOLUS
1000.0000 mL | Freq: Once | INTRAVENOUS | Status: AC
  Administered 2023-02-08: 1000 mL via INTRAVENOUS

## 2023-02-08 MED ORDER — TRAZODONE HCL 50 MG PO TABS
150.0000 mg | ORAL_TABLET | Freq: Every day | ORAL | Status: DC
  Administered 2023-02-08 – 2023-02-12 (×5): 150 mg via ORAL
  Filled 2023-02-08 (×3): qty 3
  Filled 2023-02-08: qty 2
  Filled 2023-02-08: qty 3

## 2023-02-08 NOTE — H&P (Addendum)
 History and Physical  [name] M Wishart Jr. FMW:[id number] DOB: [birth year] DOA: 02/07/2023  Referring physician: Dr. Trine, EDP  PCP: Shepard Ade, MD  Outpatient Specialists: Orthopedic surgery, cardiology. Patient coming from: SNF.  Chief Complaint: Hyperglycemia.  HPI: [name]. is a 78 y.o. 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 Malvin, who presented to the ED via EMS from SNF due to severely elevated blood sugar.  Per the patient he was not receiving his insulin  regularly.  States his blood sugar has been running in the 400-600 at the facility.  In the ED, tachycardic and tachypneic.  CBG 457, beta hydroxybutyrate acid elevated greater than 5, ketonuria, serum bicarb 19 with anion gap of 14.  The patient was started on DKA protocol, insulin  drip for DKA type I.  Admitted by [name], hospitalist service.  ED Course: Blood pressure 146/82, pulse 104, respiration rate 23, O2 saturation 97% on room air.  Lab studies notable for WBC 10.9, hemoglobin 13.9, MCV 102.5, platelet 119.  Serum sodium 133, glucose 474, BUN 28, creatinine 1.40, alkaline phosphatase 195, albumin  3.1, total bili 2.0, GFR 45.  Review of Systems: Review of systems as noted in the HPI. All other systems reviewed and are negative.   Past Medical History:  Diagnosis Date   Arthritis    Benign prostatic hypertrophy with hesitancy    Charcot's arthropathy, diabetic (HCC)    left foot   Chronic pain syndrome followed by dr mark phillps at pain clinic   secondary to MS and diabetic neuropathy   Chronic periodontal disease    CKD (chronic kidney disease), stage  II    hypertensive per PCP note   Coronary artery disease hx MI  s/p  DES to [name] 2005   hx cardiologist-- dr morris until he retired,  now followed by pcp , dr shepard   Depression    Diabetic neuropathy [facility])    Essential tremor    Gait disorder    uses walker   History of CVA (cerebrovascular accident)    2011-  right subcortical cva--  residual left mininmal side weakness and no use of left head   History of CVA with residual deficit    2011  --  right subcortical cva w/ mininmal left hemiparesis   History of diabetic ulcer of foot    2010  left foot   History of MI (myocardial infarction)    2005   Hypertension    dr shepard   MS (multiple sclerosis) (HCC)    dx 1985   PONV (postoperative nausea and vomiting)    S/P drug eluting coronary stent placement    2005  to midRCA   Type 1 diabetes mellitus on insulin  therapy [name])    Past Surgical History:  Procedure Laterality Date   AMPUTATION TOE Left 01/30/2023   Procedure: AMPUTATION TOE;  Surgeon: [name], DPM;  Location: [facility] OR;  Service: Orthopedics/Podiatry;  Laterality: Left;  Amputation of left great toe   CATARACT EXTRACTION W/ INTRAOCULAR LENS  IMPLANT, BILATERAL  Nov 2016   CORONARY ANGIOPLASTY WITH STENT PLACEMENT  10-18-2003  dr morris   DES x1  to  mRCA (high-grade lesion), diffuse diabetic abnormalities  throughout the entire coronary tree w/ diffuse calcification and segmental plaquing throughout but noncritical disease   HARDWARE REMOVAL Left 04/20/2012   Procedure: Left foot removal deep retained screw;  Surgeon: [name] LULLA Sage, MD;  Location: [facility] OR;  Service: Orthopedics;  Laterality: Left;   MULTIPLE EXTRACTIONS WITH ALVEOLOPLASTY N/A 05/15/2015   Procedure: MULTIPLE EXTRACTIONS;  Surgeon: [name], DDS;  Location: [facility];  Service: Dentistry;  Laterality: N/A;   ORIF ANKLE FRACTURE  12/30/2011   Procedure: OPEN REDUCTION INTERNAL FIXATION (ORIF) ANKLE FRACTURE;  Surgeon:  [name] LULLA Sage, MD;  Location: [facility] OR;  Service: Orthopedics;  Laterality: Left;  Excision Bone, Abscess Left Charcot Foot, Place Antibiotic Beads, Fixation Charcot   REMOVAL SUBMANDIDULAR GLAND, BILATERAL  1970's   benign    Social History:  reports that he quit smoking about 10 years ago. His smoking use included cigars and cigarettes. He started smoking about 37 years ago. He has a 13 pack-year smoking history. He has never used smokeless tobacco. He reports that he does not drink alcohol  and does not use drugs.   No Known Allergies  Family History  Problem Relation Age of Onset   Tremor Mother    Heart disease Father    Celiac disease Brother    Cancer - Prostate Paternal Grandfather       Prior to Admission medications   Medication Sig Start Date End Date Taking? Authorizing Provider  acetaminophen  (TYLENOL ) 500 MG tablet Take 2 tablets (1,000 mg total) by mouth every 8 (eight) hours for 7 days. 02/03/23 02/10/23 Yes Dahal, Chapman, MD  amLODipine  (NORVASC ) 5 MG tablet Take 5 mg by mouth every morning.    Yes [provider]  amoxicillin -clavulanate (AUGMENTIN ) 875-125 MG tablet Take 1 tablet by mouth every 12 (twelve) hours for 7 days. 02/03/23 02/10/23 Yes Dahal, Chapman, MD  aspirin  81 MG tablet Take 81 mg by mouth daily.   Yes [provider]  atorvastatin  (LIPITOR) 10 MG tablet Take 10 mg by mouth at bedtime.   Yes [provider]  bisacodyl  (DULCOLAX) 5 MG EC tablet Take 2 tablets (10 mg total) by mouth daily as needed for moderate constipation. 10/14/22  Yes Sheikh, Omair Latif, DO  clopidogrel  (PLAVIX ) 75 MG tablet Take 75 mg by mouth daily.   Yes [provider]  DULoxetine  (CYMBALTA ) 60 MG capsule Take 60 mg by mouth 2 (two) times daily.   Yes [provider]  finasteride  (PROSCAR ) 5 MG tablet Take 5 mg by mouth every morning.    Yes [provider]  insulin  aspart (NOVOLOG ) 100 UNIT/ML injection Inject 4-16 Units into the skin  3 (three) times daily before meals. IF 111 - 150 = 4 Units 151 - 200 = 8 Units 201 - 250 = 10 Units 251 - 300 = 12 Units 301 - 350 = 14 Units 351 - 400 = 16 Units 400< = Call MD   Yes [provider]  insulin  glargine-yfgn (SEMGLEE ) 100 UNIT/ML injection Inject 0.3 mLs (30 Units total) into the skin at bedtime. 02/03/23  Yes Dahal, Chapman, MD  metoprolol  tartrate (LOPRESSOR ) 25 MG tablet Take 25 mg by mouth 2 (two) times daily.   Yes [provider]  ondansetron  (ZOFRAN ) 4 MG tablet Take 1 tablet (4 mg total) by mouth every 6 (six) hours as needed for nausea. 10/14/22  Yes Sheikh, Omair Latif, DO  oxyCODONE  (ROXICODONE ) 15 MG immediate release tablet Take 15 mg by mouth every 6 (six) hours  as needed for pain.   Yes [provider]  Polyethylene Glycol 3350  (PEG 3350 ) 17 GM/SCOOP POWD Take 17 g by mouth 2 (two) times daily. 10/14/22  Yes Sheikh, Omair Latif, DO  senna-docusate (SENOKOT-S) 8.6-50 MG tablet Take 1 tablet by mouth 2 (two) times daily. 10/14/22  Yes Sheikh, Omair Latif, DO  traZODone  (DESYREL ) 150 MG tablet Take 150 mg by mouth at bedtime.   Yes [provider]  TUBERCULIN PPD ID Inject 0.1 mLs into the skin daily.   Yes [provider]  lactobacillus acidophilus & bulgar (LACTINEX) chewable tablet Chew 1 tablet by mouth 3 (three) times daily with meals for 7 days. 02/03/23 02/10/23  Arlice Reichert, MD  nystatin  cream-hydrocortisone  cream-zinc  oxide Apply topically 3 (three) times daily. 10/14/22   Sherrill Alejandro Donovan, DO    Physical Exam: BP (!) 123/92   Pulse 95   Resp 18   SpO2 98%   General: 78 y.o. year-old male well developed well nourished in no acute distress.  Alert and oriented x3. Cardiovascular: Regular rate and rhythm with no rubs or gallops.  No thyromegaly or JVD noted.  Trace lower extremity edema bilaterally Respiratory: Clear to auscultation with no wheezes or rales. Good inspiratory effort. Abdomen: Soft nontender  nondistended with normal bowel sounds x4 quadrants. Muskuloskeletal: No cyanosis, clubbing or edema noted bilaterally Neuro: CN II-XII intact, strength, sensation, reflexes Skin: Left charcot foot in surgical dressing. Psychiatry: Judgement and insight appear normal. Mood is appropriate for condition and setting          Labs on Admission:  Basic Metabolic Panel: Recent Labs  Lab 02/01/23 0557 02/03/23 0522 02/07/23 2336 02/07/23 2355  NA 136 138 130* 133*  K 4.2 4.1 4.6 4.9  CL 104 102 97* 99  CO2 25 27 19*  --   GLUCOSE 159* 150* 480* 474*  BUN 24* 21 28* 28*  CREATININE 1.40* 1.37* 1.58* 1.40*  CALCIUM  8.2* 8.2* 8.3*  --    Liver Function Tests: Recent Labs  Lab 02/07/23 2336  AST 37  ALT 41  ALKPHOS 195*  BILITOT 2.0*  PROT 7.6  ALBUMIN  3.1*   No results for input(s): LIPASE, AMYLASE in the last 168 hours. No results for input(s): AMMONIA in the last 168 hours. CBC: Recent Labs  Lab 02/01/23 0557 02/03/23 0522 02/07/23 2336 02/07/23 2355  WBC 7.4 6.0 10.9*  --   NEUTROABS 4.6 3.5 8.0*  --   HGB 13.0 12.9* 13.2 13.9  HCT 37.6* 37.9* 40.3 41.0  MCV 98.7 100.3* 102.5*  --   PLT 90* 79* 119*  --    Cardiac Enzymes: No results for input(s): CKTOTAL, CKMB, CKMBINDEX, TROPONINI in the last 168 hours.  BNP (last 3 results) No results for input(s): BNP in the last 8760 hours.  ProBNP (last 3 results) No results for input(s): PROBNP in the last 8760 hours.  CBG: Recent Labs  Lab 02/02/23 2012 02/03/23 0819 02/03/23 1112 02/08/23 0140 02/08/23 0436  GLUCAP 148* 152* 109* 457* 410*    Radiological Exams on Admission: DG Foot Complete Left Result Date: 02/07/2023 Please see detailed radiograph report in office note.   EKG: I independently viewed the EKG done and my findings are as followed: Sinus tachycardia rate of 100.  Nonspecific ST-T changes.  QTc 461.  Assessment/Plan Present on Admission:  (Resolved) DKA, type 2 (HCC)   DKA, type 1 (HCC)  Active Problems:   DKA, type 1 (HCC)  DKA type I, unclear trigger The  patient states he was not receiving his insulin  regularly at the facility. Recently treated for left foot osteomyelitis status post left great toe amputation, currently on Augmentin  twice daily, end treatment date 02/10/23. Resume home oral antibiotics with prior to admission probiotics DKA protocol in place with insulin  drip, IV fluid hydration, BMP every 4 hours, hydroxybutyrate acid every 8 hours Replete electrolytes as indicated Last Hemoglobin A1c 5.8 on 10/11/2022 Obtain repeat hemoglobin A1c Transition to subcu insulin  when indicated.  Mild anion gap metabolic acidosis in the setting of DKA Serum bicarb 19, anion gap 14 Continue to treat underlying condition Repeat BMP every 4 hours  Isolated elevated alkaline phosphatase in the setting of recent osteomyelitis Monitor for now.  Isolated bilirubin T. bili 2.0 Nonspecific Monitor for now  Diabetic polyneuropathy Minimal sensory input from ankle down bilaterally. 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 Malvin.  Chronic pain syndrome Resume home regimen.  CKD 3B Presented with creatinine 1.5 with GFR 45. Appears to be at his baseline Avoid nephrotoxic agents, dehydration and hypotension. Monitor urine output.  Pressure wounds Wound care specialist consulted Local wound care with the guidance of wound care specialist  Situational  insomnia Resume home trazodone   Hypertension BP is not at goal, elevated Resume home p.o. Lopressor  Monitor vital signs   Critical care time: 65 minutes    DVT prophylaxis: Subcu Lovenox  daily  Code Status: DNR.  Family Communication: None at bedside.  Disposition Plan: Admitted to stepdown unit.  Consults called: Wound care specialist.  Admission status: Inpatient status.   Status is: Inpatient The patient requires at least 2 midnights for further evaluation and treatment of present condition.   Terry LOISE Hurst MD [facility] Pager [phone]  If 7PM-7AM, please contact night-coverage [email or web address] Password [email or web address]  02/08/2023, 5:13 AM

## 2023-02-08 NOTE — Progress Notes (Signed)
 PROGRESS NOTE    [name] M Musleh Jr.  FMW:[id number] DOB: [birth year] DOA: 02/07/2023 PCP: Shepard Ade, MD   Brief Narrative:  The patient is a 78 year old Caucasian male with a past medical history significant for bowel to diabetes mellitus type 1, diabetic polyneuropathy, CKD stage IIIb, history of Charcot arthropathy with recent toe amputation, history of MI coronary artery disease status post PCI with stent placement, history of CVA, chronic pain syndrome as well as other comorbidities who was recently admitted from 01/30/2020 to 02/03/2023 for left great toe osteomyelitis and diabetic foot ulcer status post left great toe imitation at the MPJ level on 01/30/2023 by podiatry who presented to the ED via EMS from SNF due to severely elevated blood sugar.  Per the patient he was not receiving his insulin  regularly at his facility and states the blood sugars been running anywhere between 400-600 at the facility.  In the ED he was noted be tachycardic and tachypneic and CBG was severely elevated beta hydroxybutyrate acid was greater than 5 and then subsequently worsened to 0.7.  He was initiated on DKA protocol and insulin  drip was initiated given IV fluid hydration.  He is admitted for DKA and hyperglycemia and is improving.  He is complaining of some left foot pain and states that he may have hit his foot and so an x-ray has been done.  Podiatry is been consulted given his recent toe amputation.  Currently he is being admitted and treated for the following but not limited to:  Assessment and Plan:  DKA type I, unclear trigger -The patient states he was not receiving his insulin  regularly at the facility. -Recently treated for left foot osteomyelitis status post left great toe amputation, currently on Augmentin  twice daily, end treatment date 02/10/23 and we have resumed home oral antibiotics with prior to admission probiotics -DKA protocol in place with insulin  drip, IV fluid hydration, BMP every 4  hours, hydroxybutyrate acid every 8 hours -Replete electrolytes as indicated -Last Hemoglobin A1c 5.8 on 10/11/2022 -Obtain repeat hemoglobin A1c -Beta-Hydroxybutyric Acid Level trended up to 7.01 and now is 0.218  -[name] gap closed and he was given IV fluid hydration and he is improved so he is transition to long-acting insulin  Semglee  10 units for now but will need to titrate up given that he takes 30 units subcu nightly; anticipate increasing to 18 units tomorrow and have consulted the diabetes education coordinator -CBG Trend: Recent Labs  Lab 02/08/23 1050 02/08/23 1136 02/08/23 1238 02/08/23 1357 02/08/23 1505 02/08/23 1612 02/08/23 1704  GLUCAP 138* 138* 127* 115* 147* 159* 148*     Mild anion gap metabolic acidosis in the setting of DKA -Serum bicarb 19, anion gap 14 -Continue to treat underlying condition -Repeat BMP every 4 hours now discontinue given that his metabolic acidosis is resolved and he is now has a CO2 of 25, anion gap of 5, chloride level of 104   Isolated elevated alkaline phosphatase in the setting of recent osteomyelitis -Alk Phos Trend: Recent Labs  Lab 01/30/23 1334 02/07/23 2336  ALKPHOS 229* 195*  -Continue to Monitor and Trend and Repaet CMP in the AM    Hyperbilirubinemia -Mild and Likely reactive -Bilirubin Trend: Recent Labs  Lab 01/30/23 1334 02/07/23 2336  BILITOT 0.7 2.0*  -Continue to Monitor and Trend and repeat CMP in the AM   Diabetic Polyneuropathy -Minimal sensory input from ankle down bilaterally. -Fall precautions -Resume home Duloxetine    Coronary artery disease status post PCI with stent placement -Resume  home aspirin , Plavix , and Lipitor -No evidence of acute ischemia on 12-lead EKG -Continue to Monitor on telemetry   BPH -Resume home Proscar  -Continue to Monitor urine output   History of CVA -Resume home aspirin  and Lipitor for secondary CVA prevention   Left foot Charcot arthropathy Recent surgical  procedure -Have ordered a foot X-Ray and consulted Dr. Silva for evaluation Status post left great toe amputation at MPJ level on 01/30/2023 by Dr. Malvin.   Chronic pain syndrome Resume home regimen.   AKI on CKD 3B, improved  -Presented with creatinine 1.5 with GFR 45. -Appears to be at his baseline -BUN/Cr Trend: Recent Labs  Lab 02/01/23 0557 02/03/23 0522 02/07/23 2336 02/07/23 2355 02/08/23 0600 02/08/23 1114 02/08/23 1400  BUN 24* 21 28* 28* 32* 27* 23  CREATININE 1.40* 1.37* 1.58* 1.40* 1.52* 1.20 1.06  -Continue to Monitor UOP -Avoid Nephrotoxic Medications, Contrast Dyes, Hypotension and Dehydration to Ensure Adequate Renal Perfusion and will need to Renally Adjust Meds -Continue to Monitor and Trend Renal Function carefully and repeat CMP in the AM   Pressure wounds -Wound care specialist consulted -Local wound care with the guidance of wound care specialist   Situational insomnia -Resume home trazodone    Essential Hypertension -BP is not at goal, elevated -Resume home p.o. Lopressor  -Continue to Monitor vital signs per Protocol -Last BP reading was 156/84  Hypoalbuminemia -[name] Albumin  Trend: Recent Labs  Lab 01/30/23 1334 02/07/23 2336  ALBUMIN  2.8* 3.1*  -Continue to Monitor and Trend and repeat CMP in the AM   DVT prophylaxis: enoxaparin  (LOVENOX ) injection 40 mg Start: 02/08/23 1000    Code Status: Limited: Do not attempt resuscitation (DNR) -DNR-LIMITED -Do Not Intubate/DNI  Family Communication: No family present at bedside  Disposition Plan:  Level of care: Telemetry Status is: Inpatient Remains inpatient appropriate because: Needs further clinical Improvement and Evaluation by Podiatry and evaluation by PT/OT   Consultants:  Podiatry Diabetes Education Coordinator   Procedures:  As delineated as above   Antimicrobials:  Anti-infectives (From admission, onward)    Start     Dose/Rate Route Frequency Ordered Stop    02/08/23 1000  amoxicillin -clavulanate (AUGMENTIN ) 875-125 MG per tablet 1 tablet        1 tablet Oral Every 12 hours 02/08/23 0513         Objective: Vitals:   02/08/23 1000 02/08/23 1042 02/08/23 1300 02/08/23 1558  BP: (!) 141/67  (!) 150/73 (!) 156/84  Pulse: 80  69 76  Resp: 18  19 (!) 21  Temp:  (!) 97.5 F (36.4 C)  (!) 97.5 F (36.4 C)  TempSrc:  Oral  Oral  SpO2: 93%  94% 100%    Intake/Output Summary (Last 24 hours) at 02/08/2023 1754 Last data filed at 02/08/2023 0703 Gross per 24 hour  Intake 2000 ml  Output --  Net 2000 ml   There were no vitals filed for this visit.  Data Reviewed: I have personally reviewed following labs and imaging studies  CBC: Recent Labs  Lab 02/03/23 0522 02/07/23 2336 02/07/23 2355  WBC 6.0 10.9*  --   NEUTROABS 3.5 8.0*  --   HGB 12.9* 13.2 13.9  HCT 37.9* 40.3 41.0  MCV 100.3* 102.5*  --   PLT 79* 119*  --    Basic Metabolic Panel: Recent Labs  Lab 02/03/23 0522 02/07/23 2336 02/07/23 2355 02/08/23 0600 02/08/23 1114 02/08/23 1400  NA 138 130* 133* 131* 132* 134*  K 4.1 4.6 4.9 4.1 3.6  3.7  CL 102 97* 99 100 103 104  CO2 27 19*  --  16* 24 25  GLUCOSE 150* 480* 474* 353* 136* 127*  BUN 21 28* 28* 32* 27* 23  CREATININE 1.37* 1.58* 1.40* 1.52* 1.20 1.06  CALCIUM  8.2* 8.3*  --  7.9* 7.9* 8.1*  MG  --   --   --  2.1  --   --   PHOS  --   --   --  3.6  --   --    GFR: Estimated Creatinine Clearance: 66.1 mL/min (by C-G formula based on SCr of 1.06 mg/dL). Liver Function Tests: Recent Labs  Lab 02/07/23 2336  AST 37  ALT 41  ALKPHOS 195*  BILITOT 2.0*  PROT 7.6  ALBUMIN  3.1*   No results for input(s): LIPASE, AMYLASE in the last 168 hours. No results for input(s): AMMONIA in the last 168 hours. Coagulation Profile: No results for input(s): INR, PROTIME in the last 168 hours. Cardiac Enzymes: No results for input(s): CKTOTAL, CKMB, CKMBINDEX, TROPONINI in the last 168 hours. BNP (last 3  results) No results for input(s): PROBNP in the last 8760 hours. HbA1C: No results for input(s): HGBA1C in the last 72 hours. CBG: Recent Labs  Lab 02/08/23 1238 02/08/23 1357 02/08/23 1505 02/08/23 1612 02/08/23 1704  GLUCAP 127* 115* 147* 159* 148*   Lipid Profile: No results for input(s): CHOL, HDL, LDLCALC, TRIG, CHOLHDL, LDLDIRECT in the last 72 hours. Thyroid  Function Tests: No results for input(s): TSH, T4TOTAL, FREET4, T3FREE, THYROIDAB in the last 72 hours. Anemia Panel: No results for input(s): VITAMINB12, FOLATE, FERRITIN, TIBC, IRON, RETICCTPCT in the last 72 hours. Sepsis Labs: No results for input(s): PROCALCITON, LATICACIDVEN in the last 168 hours.  Recent Results (from the past 240 hours)  Culture, blood (Routine X 2) w Reflex to ID Panel     Status: None   Collection Time: 01/30/23  1:27 PM   Specimen: BLOOD RIGHT ARM  Result Value Ref Range Status   Specimen Description BLOOD RIGHT ARM  Final   Special Requests   Final    BOTTLES DRAWN AEROBIC AND ANAEROBIC Blood Culture adequate volume   Culture   Final    NO GROWTH 5 DAYS Performed at [facility], [address], [place], [place] [address]    Report Status 02/04/2023 FINAL  Final  Culture, blood (Routine X 2) w Reflex to ID Panel     Status: None   Collection Time: 01/30/23  1:34 PM   Specimen: BLOOD LEFT HAND  Result Value Ref Range Status   Specimen Description BLOOD LEFT HAND  Final   Special Requests   Final    BOTTLES DRAWN AEROBIC AND ANAEROBIC Blood Culture adequate volume   Culture   Final    NO GROWTH 5 DAYS Performed at [facility], [address] [place], [place] [address]    Report Status 02/04/2023 FINAL  Final  Fungus Culture With Stain     Status: None (Preliminary result)   Collection Time: 01/30/23  4:08 PM   Specimen: PATH Digit amputation; Tissue  Result Value Ref Range Status   Fungus Stain Final report  Final     Comment: (NOTE) Performed At: [facility] [address] [place], [place] [id number] Jennette Shorter MD Ey:[id number]    Fungus (Mycology) Culture PENDING  Incomplete   Fungal Source TOE  Final    Comment: LEFT Performed at [facility], [address],  [place], [place] [address]   Aerobic/Anaerobic Culture w Gram Stain (surgical/deep wound)     Status: None   Collection Time: 01/30/23  4:08 PM   Specimen: PATH Digit amputation; Tissue  Result Value Ref Range Status   Specimen Description BONE LEFT  Final   Special Requests NONE  Final   Gram Stain   Final    FEW WBC PRESENT,BOTH PMN AND MONONUCLEAR RARE GRAM POSITIVE COCCI IN PAIRS    Culture   Final    FEW STAPHYLOCOCCUS AUREUS NO ANAEROBES ISOLATED Performed at [facility], [address], [place], [place] [address]    Report Status 02/04/2023 FINAL  Final   Organism ID, Bacteria STAPHYLOCOCCUS AUREUS  Final      Susceptibility   Staphylococcus aureus - MIC*    CIPROFLOXACIN  <=0.5 SENSITIVE Sensitive     ERYTHROMYCIN >=8 RESISTANT Resistant     GENTAMICIN  <=0.5 SENSITIVE Sensitive     OXACILLIN 0.5 SENSITIVE Sensitive     TETRACYCLINE >=16 RESISTANT Resistant     VANCOMYCIN  <=0.5 SENSITIVE Sensitive     TRIMETH/SULFA <=10 SENSITIVE Sensitive     CLINDAMYCIN >=8 RESISTANT Resistant     RIFAMPIN <=0.5 SENSITIVE Sensitive     Inducible Clindamycin NEGATIVE Sensitive     LINEZOLID 2 SENSITIVE Sensitive     * FEW STAPHYLOCOCCUS AUREUS  Acid Fast Smear (AFB)     Status: None   Collection Time: 01/30/23  4:08 PM   Specimen: PATH Digit amputation; Tissue  Result Value Ref Range Status   AFB Specimen Processing Concentration  Final   Acid Fast Smear Negative  Final    Comment: (NOTE) Performed At: [facility] [address] [place], [place] [id number] Jennette Shorter MD Ey:[id number]    Source (AFB) TOE  Final    Comment: LEFT Performed at [facility], [address]  [place], [place] [address]   Fungus Culture Result     Status: None   Collection Time: 01/30/23  4:08 PM  Result Value Ref Range Status   Result 1 Comment  Final    Comment: (NOTE) KOH/Calcofluor preparation:  no fungus observed. Performed At: [facility] [address] [place], [place] [id number] Jennette Shorter MD Ey:[id number]   Fungus Culture With Stain     Status: None (Preliminary result)   Collection Time: 01/30/23  4:17 PM   Specimen: PATH Digit amputation; Tissue  Result Value Ref Range Status   Fungus Stain Final report  Final    Comment: (NOTE) Performed At: [facility] [address] [place], [place] [id number] Jennette Shorter MD Ey:[id number]    Fungus (Mycology) Culture PENDING  Incomplete   Fungal Source TOE  Final    Comment: LEFT Performed at [facility], [address], [place], [place] [address]   Aerobic/Anaerobic Culture w Gram Stain (surgical/deep wound)     Status: None   Collection Time: 01/30/23  4:17 PM   Specimen: PATH Digit amputation; Tissue  Result Value Ref Range Status   Specimen Description TOE LEFT  Final   Special Requests NONE  Final   Gram Stain   Final    RARE WBC PRESENT, PREDOMINANTLY MONONUCLEAR NO ORGANISMS SEEN    Culture   Final    MODERATE STAPHYLOCOCCUS AUREUS NO ANAEROBES ISOLATED Performed at [facility], [address] [place], [place] [address]    Report Status 02/04/2023 FINAL  Final   Organism ID, Bacteria STAPHYLOCOCCUS AUREUS  Final  Susceptibility   Staphylococcus aureus - MIC*    CIPROFLOXACIN  <=0.5 SENSITIVE Sensitive     ERYTHROMYCIN >=8 RESISTANT Resistant     GENTAMICIN  <=0.5 SENSITIVE Sensitive     OXACILLIN <=0.25 SENSITIVE Sensitive     TETRACYCLINE >=16 RESISTANT Resistant     VANCOMYCIN  <=0.5 SENSITIVE Sensitive     TRIMETH/SULFA <=10 SENSITIVE Sensitive     CLINDAMYCIN >=8 RESISTANT Resistant     RIFAMPIN <=0.5 SENSITIVE Sensitive     Inducible Clindamycin NEGATIVE  Sensitive     LINEZOLID 2 SENSITIVE Sensitive     * MODERATE STAPHYLOCOCCUS AUREUS  Acid Fast Culture with reflexed sensitivities     Status: None   Collection Time: 01/30/23  4:17 PM   Specimen: PATH Digit amputation; Tissue  Result Value Ref Range Status   Acid Fast Culture OLDAGE  Final    Comment: (NOTE) Test not performed due to the age of this specimen.      Notified Hezzie Cover Performed At: [facility] [address] [place], [place] [id number] Jennette Shorter MD Ey:[id number]    Source of Sample TOE  Final    Comment: LEFT Performed at [facility], [address] [place], [place] [address]   Acid Fast Smear (AFB)     Status: None   Collection Time: 01/30/23  4:17 PM   Specimen: PATH Digit amputation; Tissue  Result Value Ref Range Status   AFB Specimen Processing OLDAGE  Final    Comment: (NOTE) Test not performed due to the age of this specimen.      Notified Hezzie Cover    Acid Fast Smear NOT PERFORMED  Final    Comment: (NOTE) Test not performed Performed At: [facility] [address] [place], [place] [id number] Jennette Shorter MD Ey:[id number]    Source (AFB) TOE  Final    Comment: LEFT Performed at [facility], [address] [place], [place] [address]   Fungus Culture Result     Status: None   Collection Time: 01/30/23  4:17 PM  Result Value Ref Range Status   Result 1 Comment  Final    Comment: (NOTE) KOH/Calcofluor preparation:  no fungus observed. Performed At: [facility] [address] [place], [place] [id number] Jennette Shorter MD Ey:[id number]     Radiology Studies: DG Foot 2 Views Left Result Date: 02/08/2023 CLINICAL DATA:  Type 1 diabetic with Charcot arthropathy.  Pain. EXAM: LEFT FOOT - 2 VIEW COMPARISON:  01/30/2023 FINDINGS: Midfoot fusion and first toe amputation. There is a bandage over the great toe amputation site without soft tissue gas or emphysema. Chronic midfoot collapse. Remote  second and third metatarsal fractures. Generalized osteopenia. IMPRESSION: No acute osseous finding. Electronically Signed   By: Dorn Roulette M.D.   On: 02/08/2023 10:07   DG Foot Complete Left Result Date: 02/07/2023 Please see detailed radiograph report in office note.  Scheduled Meds:  amoxicillin -clavulanate  1 tablet Oral Q12H   aspirin   81 mg Oral Daily   atorvastatin   10 mg Oral QHS   clopidogrel   75 mg Oral Daily   DULoxetine   60 mg Oral BID   enoxaparin  (LOVENOX ) injection  40 mg Subcutaneous Q24H   finasteride   5 mg Oral q morning   insulin  aspart  0-15 Units Subcutaneous TID WC   insulin  aspart  0-5 Units Subcutaneous QHS   insulin  glargine-yfgn  10 Units Subcutaneous Daily   lactobacillus  1 g Oral TID WC   metoprolol  tartrate  12.5  mg Oral BID   polyethylene glycol  17 g Oral BID   senna-docusate  1 tablet Oral BID   traZODone   150 mg Oral QHS   Continuous Infusions:  dextrose  5% lactated ringers  Stopped (02/08/23 1613)   insulin  Stopped (02/08/23 1613)   lactated ringers  Stopped (02/08/23 0805)    LOS: 0 days   Alejandro [name] Marker, DO [facility] Available via Epic secure chat 7am-7pm After these hours, please refer to coverage provider listed on [email or web address] 02/08/2023, 5:54 PM

## 2023-02-08 NOTE — Hospital Course (Addendum)
 Brief Narrative:  78 year old with history of DM1, diabetic polyneuropathy, CKD 3B, Charcot's arthropathy with recent toe amputation, CAD status post PCI, CVA, chronic pain recently admitted from 12/23 - 12/27 for great left toe osteomyelitis and underwent amputation on 12/23..  Admitted this time for DKA, started on DKA protocol.  Eventually blood glucose stabilized.  PT/OT recommended SNF.   Assessment & Plan:  Active Problems:   DKA, type 1 (HCC)   DKA, type 2 (HCC)   Contusion of second toe of left foot   Status post amputation of left great toe (HCC)    Diabetic ketoacidosis, resolved History of diabetes mellitus type 1 Diabetic polyneuropathy - DKA protocol, slowly transition to subcu home regimen including sliding scale and Accu-Cheks.  Labile blood glucose.  Seen by diabetic coordinator continue to adjust his long-acting insulin , sliding scale and Premeal.  Eventually would benefit from outpatient follow-up with endocrinology.  A1c 6.5.  AKI on CKD stage IIIa, resolved - Baseline creatinine 1.2, peaked at 1.58  Status post left great toe amputation History of left foot Charcot arthropathy - Recent amputation on 12/23.  Seen by podiatry during this admission, recommending routine dressing and outpatient follow-up. On Total 7 days of Augmentin  since dc on 12/27.  Change bandage on second toe daily with antibiotic ointment and Band-Aid. Gauze dressing for toe amputation may remain intact until outpatient follow-up   Hyponatremia - This is pseudohyponatremia in setting of hyperglycemia  CAD status post PCI - Continue home including aspirin , Plavix , statin, Lopressor   Essential hypertension - Continue home Norvasc , metoprolol   PT/OT-SNF  DVT prophylaxis: enoxaparin  (LOVENOX ) injection 40 mg Start: 02/08/23 1000   Code Status: Limited: Do not attempt resuscitation (DNR) -DNR-LIMITED -Do Not Intubate/DNI  Family Communication:  Called Melanie.  Status is: Inpatient Remains  inpatient appropriate because: SNF placement.     Subjective: Doing well 1 episode of hyperglycemia this morning requiring corrective sliding scale. No complaints otherwise.   Examination: General exam: Appears calm and comfortable  Respiratory system: Clear to auscultation. Respiratory effort normal. Cardiovascular system: S1 & S2 heard, RRR. No JVD, murmurs, rubs, gallops or clicks. No pedal edema. Gastrointestinal system: Abdomen is nondistended, soft and nontender. No organomegaly or masses felt. Normal bowel sounds heard. Central nervous system: Alert and oriented. No focal neurological deficits. Extremities: Symmetric 5 x 5 power. Skin: Left foot dressing in place.  Psychiatry: Judgement and insight appear normal. Mood & affect appropriate.

## 2023-02-09 ENCOUNTER — Telehealth: Payer: Self-pay | Admitting: Podiatry

## 2023-02-09 ENCOUNTER — Other Ambulatory Visit (HOSPITAL_BASED_OUTPATIENT_CLINIC_OR_DEPARTMENT_OTHER): Payer: Self-pay

## 2023-02-09 DIAGNOSIS — S90122A Contusion of left lesser toe(s) without damage to nail, initial encounter: Secondary | ICD-10-CM

## 2023-02-09 DIAGNOSIS — G8929 Other chronic pain: Secondary | ICD-10-CM

## 2023-02-09 DIAGNOSIS — E1010 Type 1 diabetes mellitus with ketoacidosis without coma: Secondary | ICD-10-CM | POA: Diagnosis not present

## 2023-02-09 DIAGNOSIS — Z89412 Acquired absence of left great toe: Secondary | ICD-10-CM | POA: Diagnosis not present

## 2023-02-09 LAB — CBC WITH DIFFERENTIAL/PLATELET
Abs Immature Granulocytes: 0.04 10*3/uL (ref 0.00–0.07)
Basophils Absolute: 0 10*3/uL (ref 0.0–0.1)
Basophils Relative: 0 %
Eosinophils Absolute: 0.4 10*3/uL (ref 0.0–0.5)
Eosinophils Relative: 4 %
HCT: 38.4 % — ABNORMAL LOW (ref 39.0–52.0)
Hemoglobin: 13.1 g/dL (ref 13.0–17.0)
Immature Granulocytes: 0 %
Lymphocytes Relative: 28 %
Lymphs Abs: 2.6 10*3/uL (ref 0.7–4.0)
MCH: 35.2 pg — ABNORMAL HIGH (ref 26.0–34.0)
MCHC: 34.1 g/dL (ref 30.0–36.0)
MCV: 103.2 fL — ABNORMAL HIGH (ref 80.0–100.0)
Monocytes Absolute: 0.7 10*3/uL (ref 0.1–1.0)
Monocytes Relative: 8 %
Neutro Abs: 5.6 10*3/uL (ref 1.7–7.7)
Neutrophils Relative %: 60 %
Platelets: 116 10*3/uL — ABNORMAL LOW (ref 150–400)
RBC: 3.72 MIL/uL — ABNORMAL LOW (ref 4.22–5.81)
RDW: 14.2 % (ref 11.5–15.5)
WBC: 9.3 10*3/uL (ref 4.0–10.5)
nRBC: 0 % (ref 0.0–0.2)

## 2023-02-09 LAB — CBG MONITORING, ED
Glucose-Capillary: 399 mg/dL — ABNORMAL HIGH (ref 70–99)
Glucose-Capillary: 309 mg/dL — ABNORMAL HIGH (ref 70–99)

## 2023-02-09 LAB — COMPREHENSIVE METABOLIC PANEL
ALT: 31 U/L (ref 0–44)
AST: 31 U/L (ref 15–41)
Albumin: 2.6 g/dL — ABNORMAL LOW (ref 3.5–5.0)
Alkaline Phosphatase: 149 U/L — ABNORMAL HIGH (ref 38–126)
Anion gap: 13 (ref 5–15)
BUN: 22 mg/dL (ref 8–23)
CO2: 19 mmol/L — ABNORMAL LOW (ref 22–32)
Calcium: 8.2 mg/dL — ABNORMAL LOW (ref 8.9–10.3)
Chloride: 99 mmol/L (ref 98–111)
Creatinine, Ser: 1.29 mg/dL — ABNORMAL HIGH (ref 0.61–1.24)
GFR, Estimated: 58 mL/min — ABNORMAL LOW (ref >60)
Glucose, Bld: 360 mg/dL — ABNORMAL HIGH (ref 70–99)
Potassium: 4.4 mmol/L (ref 3.5–5.1)
Sodium: 131 mmol/L — ABNORMAL LOW (ref 135–145)
Total Bilirubin: 1.4 mg/dL — ABNORMAL HIGH (ref 0.0–1.2)
Total Protein: 6.4 g/dL — ABNORMAL LOW (ref 6.5–8.1)

## 2023-02-09 LAB — GLUCOSE, CAPILLARY
Glucose-Capillary: 145 mg/dL — ABNORMAL HIGH (ref 70–99)
Glucose-Capillary: 184 mg/dL — ABNORMAL HIGH (ref 70–99)

## 2023-02-09 LAB — PHOSPHORUS: Phosphorus: 3.2 mg/dL (ref 2.5–4.6)

## 2023-02-09 LAB — MAGNESIUM: Magnesium: 2 mg/dL (ref 1.7–2.4)

## 2023-02-09 MED ORDER — SODIUM CHLORIDE 0.9 % IV BOLUS
1000.0000 mL | Freq: Once | INTRAVENOUS | Status: AC
  Administered 2023-02-09: 1000 mL via INTRAVENOUS

## 2023-02-09 MED ORDER — INSULIN GLARGINE-YFGN 100 UNIT/ML ~~LOC~~ SOLN
18.0000 [IU] | Freq: Every day | SUBCUTANEOUS | Status: DC
  Administered 2023-02-09 – 2023-02-10 (×2): 18 [IU] via SUBCUTANEOUS
  Filled 2023-02-09 (×3): qty 0.18

## 2023-02-09 MED ORDER — TAPENTADOL HCL ER 100 MG PO TB12
200.0000 mg | ORAL_TABLET | Freq: Two times a day (BID) | ORAL | Status: DC
  Administered 2023-02-09 – 2023-02-13 (×9): 200 mg via ORAL
  Filled 2023-02-09 (×9): qty 2

## 2023-02-09 MED ORDER — INSULIN GLARGINE-YFGN 100 UNIT/ML ~~LOC~~ SOLN
10.0000 [IU] | Freq: Every day | SUBCUTANEOUS | Status: DC
  Administered 2023-02-09 – 2023-02-10 (×2): 10 [IU] via SUBCUTANEOUS
  Filled 2023-02-09 (×2): qty 0.1

## 2023-02-09 MED ORDER — INSULIN ASPART 100 UNIT/ML IJ SOLN
4.0000 [IU] | Freq: Three times a day (TID) | INTRAMUSCULAR | Status: DC
  Administered 2023-02-10 – 2023-02-11 (×4): 4 [IU] via SUBCUTANEOUS

## 2023-02-09 NOTE — Consult Note (Signed)
 WOC Nurse Consult Note: Reason for Consult:amputation site left great toe.  01/30/23.  Stitches are intact  Periwound erythema and edema.  Orders for dry dressing daily per podiatry.   Wound type: surgical Pressure Injury POA: NA Measurement: suture line along left medial foot at MP joint great toe.  Wound azi:Dlulmzd intact Drainage (amount, consistency, odor) minimal serosanguinous  no odor Periwound: erythema  Dressing procedure/placement/frequency: Should have offloading (DARCO) shoe when out of bed.   Dry dressing to suture line. Change daily.  Will not follow at this time.  Please re-consult if needed.  Darice Cooley MSN, RN, FNP-BC CWON Wound, Ostomy, Continence Nurse Outpatient [facility] [phone] Pager [phone]

## 2023-02-09 NOTE — Inpatient Diabetes Management (Signed)
 Inpatient Diabetes Program Recommendations  AACE/ADA: New Consensus Statement on Inpatient Glycemic Control (2015)  Target Ranges:  Prepandial:   less than 140 mg/dL      Peak postprandial:   less than 180 mg/dL (1-2 hours)      Critically ill patients:  140 - 180 mg/dL   Lab Results  Component Value Date   GLUCAP 309 (H) 02/09/2023   HGBA1C 5.8 (H) 10/11/2022    Review of Glycemic Control  Diabetes history: DM1 Outpatient Diabetes medications: Semglee  30 units at bedtime,Novolog /Humalog 4-16 TID Current orders for Inpatient glycemic control: Semglee  18 daily, Novolog  0-15 TID with meals and 0-5 HS  HgbA1C - 5.8%  Inpatient Diabetes Program Recommendations:   Add Semglee  10 units at bedtime, continue Semglee  18 units in am.  Add Novolog  4 units TID with meals if eating > 50%.  Spoke with pt at bedside regarding his diabetes and DKA admission. Pt states he was at [facility] and  did not receive the insulin  that was ordered. Inquired about Humulin  U-500 which was on the med rec and he said he does not take this. Pt also states he was taking NPH 20 units BID prior to SNF admission. Said he does not want to go back to SNF, if possible. Has Libre CGM and monitors blood sugars regularly.   Continue to follow.  Thank you. Shona Brandy, RD, LDN, CDCES Inpatient Diabetes Coordinator [phone]

## 2023-02-09 NOTE — Consult Note (Signed)
 Reason for Consult: Postop toe amp, thinks toe may be fractured Referring Physician: Dr. Sherrill Leaks [name]. is an 78 y.o. male.  HPI: Patient under our service had a toe amputation of the left great toe on 01/30/2023 with Dr. Malvin.  Has been at a nursing facility since discharge, was readmitted with DKA on 02/08/2023.  He thinks he bumped the second toe on something and it had been bleeding wondered if it was broken  Past Medical History:  Diagnosis Date   Arthritis    Benign prostatic hypertrophy with hesitancy    Charcot's arthropathy, diabetic (HCC)    left foot   Chronic pain syndrome followed by dr [name] at pain clinic   secondary to MS and diabetic neuropathy   Chronic periodontal disease    CKD (chronic kidney disease), stage II    hypertensive per PCP note   Coronary artery disease hx MI  s/p  DES to [facility] 2005   hx cardiologist-- dr [name] until he retired,  now followed by pcp , dr [name]   Depression    Diabetic neuropathy [facility])    Essential tremor    Gait disorder    uses walker   History of CVA (cerebrovascular accident)    2011-  right subcortical cva--  residual left mininmal side weakness and no use of left head   History of CVA with residual deficit    2011  --  right subcortical cva w/ mininmal left hemiparesis   History of diabetic ulcer of foot    2010  left foot   History of MI (myocardial infarction)    2005   Hypertension    dr shepard   MS (multiple sclerosis) (HCC)    dx 1985   PONV (postoperative nausea and vomiting)    S/P drug eluting coronary stent placement    2005  to midRCA   Type 1 diabetes mellitus on insulin  therapy [facility])     Past Surgical History:  Procedure Laterality Date   AMPUTATION TOE Left 01/30/2023   Procedure: AMPUTATION TOE;  Surgeon: [name], DPM;  Location: [facility] OR;  Service: Orthopedics/Podiatry;  Laterality: Left;  Amputation of left great toe   CATARACT EXTRACTION W/ INTRAOCULAR LENS   IMPLANT, BILATERAL  Nov 2016   CORONARY ANGIOPLASTY WITH STENT PLACEMENT  10-18-2003  dr [name]   DES x1  to  [facility] (high-grade lesion), diffuse diabetic abnormalities throughout the entire coronary tree w/ diffuse calcification and segmental plaquing throughout but noncritical disease   HARDWARE REMOVAL Left 04/20/2012   Procedure: Left foot removal deep retained screw;  Surgeon: [name];  Location: [facility] OR;  Service: Orthopedics;  Laterality: Left;   MULTIPLE EXTRACTIONS WITH ALVEOLOPLASTY N/A 05/15/2015   Procedure: MULTIPLE EXTRACTIONS;  Surgeon: [name];  Location: [facility];  Service: Dentistry;  Laterality: N/A;   ORIF ANKLE FRACTURE  12/30/2011   Procedure: OPEN REDUCTION INTERNAL FIXATION (ORIF) ANKLE FRACTURE;  Surgeon: [name];  Location: [facility] OR;  Service: Orthopedics;  Laterality: Left;  Excision Bone, Abscess Left Charcot Foot, Place Antibiotic Beads, Fixation Charcot   REMOVAL SUBMANDIDULAR GLAND, BILATERAL  1970's   benign    Family History  Problem Relation Age of Onset   Tremor Mother    Heart disease Father    Celiac disease Brother    Cancer - Prostate Paternal Grandfather     Social History:  reports that he quit smoking about 10 years  ago. His smoking use included cigars and cigarettes. He started smoking about 37 years ago. He has a 13 pack-year smoking history. He has never used smokeless tobacco. He reports that he does not drink alcohol  and does not use drugs.  Allergies: No Known Allergies  Medications: I have reviewed the patient's current medications.  Results for orders placed or performed during the hospital encounter of 02/07/23 (from the past 48 hours)  Osmolality     Status: Abnormal   Collection Time: 02/07/23 11:36 PM  Result Value Ref Range   Osmolality 316 (H) 275 - 295 mOsm/kg    Comment: Performed at [facility], [address] [place], [place] [address]  CBC with Differential (PNL)     Status:  Abnormal   Collection Time: 02/07/23 11:36 PM  Result Value Ref Range   WBC 10.9 (H) 4.0 - 10.5 K/uL   RBC 3.93 (L) 4.22 - 5.81 MIL/uL   Hemoglobin 13.2 13.0 - 17.0 g/dL   HCT 59.6 60.9 - 47.9 %   MCV 102.5 (H) 80.0 - 100.0 fL   MCH 33.6 26.0 - 34.0 pg   MCHC 32.8 30.0 - 36.0 g/dL   RDW 86.3 88.4 - 84.4 %   Platelets 119 (L) 150 - 400 K/uL   nRBC 0.0 0.0 - 0.2 %   Neutrophils Relative % 73 %   Neutro Abs 8.0 (H) 1.7 - 7.7 K/uL   Lymphocytes Relative 17 %   Lymphs Abs 1.8 0.7 - 4.0 K/uL   Monocytes Relative 7 %   Monocytes Absolute 0.7 0.1 - 1.0 K/uL   Eosinophils Relative 2 %   Eosinophils Absolute 0.3 0.0 - 0.5 K/uL   Basophils Relative 1 %   Basophils Absolute 0.1 0.0 - 0.1 K/uL   Immature Granulocytes 0 %   Abs Immature Granulocytes 0.04 0.00 - 0.07 K/uL    Comment: Performed at [facility], [address], [place], [place] [address]  Beta-hydroxybutyric acid     Status: Abnormal   Collection Time: 02/07/23 11:36 PM  Result Value Ref Range   Beta-Hydroxybutyric Acid 5.04 (H) 0.05 - 0.27 mmol/L    Comment: RESULT CONFIRMED BY MANUAL DILUTION Performed at [facility], [address] [place], [place] [address]   Comprehensive metabolic panel     Status: Abnormal   Collection Time: 02/07/23 11:36 PM  Result Value Ref Range   Sodium 130 (L) 135 - 145 mmol/L   Potassium 4.6 3.5 - 5.1 mmol/L   Chloride 97 (L) 98 - 111 mmol/L   CO2 19 (L) 22 - 32 mmol/L   Glucose, Bld 480 (H) 70 - 99 mg/dL    Comment: Glucose reference range applies only to samples taken after fasting for at least 8 hours.   BUN 28 (H) 8 - 23 mg/dL   Creatinine, Ser 8.41 (H) 0.61 - 1.24 mg/dL   Calcium  8.3 (L) 8.9 - 10.3 mg/dL   Total Protein 7.6 6.5 - 8.1 g/dL   Albumin  3.1 (L) 3.5 - 5.0 g/dL   AST 37 15 - 41 U/L   ALT 41 0 - 44 U/L   Alkaline Phosphatase 195 (H) 38 - 126 U/L   Total Bilirubin 2.0 (H) 0.0 - 1.2 mg/dL   GFR, Estimated 45 (L) >60 mL/min     Comment: (NOTE) Calculated using the CKD-EPI Creatinine Equation (2021)    Anion gap 14 5 - 15    Comment: Performed at [facility], [address] [place], [place] [address]  Blood gas, venous     Status: Abnormal   Collection Time: 02/07/23 11:36 PM  Result Value Ref Range   pH, Ven 7.33 7.25 - 7.43   pCO2, Ven 40 (L) 44 - 60 mmHg   pO2, Ven 33 32 - 45 mmHg   Bicarbonate 21.1 20.0 - 28.0 mmol/L   Acid-base deficit 4.5 (H) 0.0 - 2.0 mmol/L   O2 Saturation 53.2 %   Patient temperature 37.0    Drawn by [id number]     Comment: Performed at [facility], [address], [place], [place] [address]  Dozier brew 8, ED     Status: Abnormal   Collection Time: 02/07/23 11:55 PM  Result Value Ref Range   Sodium 133 (L) 135 - 145 mmol/L   Potassium 4.9 3.5 - 5.1 mmol/L   Chloride 99 98 - 111 mmol/L   BUN 28 (H) 8 - 23 mg/dL   Creatinine, Ser 8.59 (H) 0.61 - 1.24 mg/dL   Glucose, Bld 525 (H) 70 - 99 mg/dL    Comment: Glucose reference range applies only to samples taken after fasting for at least 8 hours.   Calcium , Ion 1.07 (L) 1.15 - 1.40 mmol/L   TCO2 22 22 - 32 mmol/L   Hemoglobin 13.9 13.0 - 17.0 g/dL   HCT 58.9 60.9 - 47.9 %  CBG monitoring, ED     Status: Abnormal   Collection Time: 02/08/23  1:40 AM  Result Value Ref Range   Glucose-Capillary 457 (H) 70 - 99 mg/dL    Comment: Glucose reference range applies only to samples taken after fasting for at least 8 hours.  Urinalysis, Routine w reflex microscopic -Urine, Clean Catch     Status: Abnormal   Collection Time: 02/08/23  1:54 AM  Result Value Ref Range   Color, Urine YELLOW YELLOW   APPearance CLEAR CLEAR   Specific Gravity, Urine 1.018 1.005 - 1.030   pH 5.0 5.0 - 8.0   Glucose, UA >=500 (A) NEGATIVE mg/dL   Hgb urine dipstick NEGATIVE NEGATIVE   Bilirubin Urine NEGATIVE NEGATIVE   Ketones, ur 20 (A) NEGATIVE mg/dL   Protein, ur 30 (A) NEGATIVE mg/dL   Nitrite NEGATIVE NEGATIVE    Leukocytes,Ua NEGATIVE NEGATIVE   RBC / HPF 0-5 0 - 5 RBC/hpf   WBC, UA 0-5 0 - 5 WBC/hpf   Bacteria, UA NONE SEEN NONE SEEN   Squamous Epithelial / HPF 0-5 0 - 5 /HPF    Comment: Performed at [facility], [address], [place], [place] [address]  CBG monitoring, ED     Status: Abnormal   Collection Time: 02/08/23  4:36 AM  Result Value Ref Range   Glucose-Capillary 410 (H) 70 - 99 mg/dL    Comment: Glucose reference range applies only to samples taken after fasting for at least 8 hours.  Beta-hydroxybutyric acid     Status: Abnormal   Collection Time: 02/08/23  6:00 AM  Result Value Ref Range   Beta-Hydroxybutyric Acid 7.01 (H) 0.05 - 0.27 mmol/L    Comment: RESULT CONFIRMED BY MANUAL DILUTION Performed at [facility], [address], [place], [place] [address]   Basic metabolic panel     Status: Abnormal   Collection Time: 02/08/23  6:00 AM  Result Value Ref Range   Sodium 131 (L) 135 - 145 mmol/L   Potassium 4.1 3.5 - 5.1 mmol/L   Chloride 100 98 - 111  mmol/L   CO2 16 (L) 22 - 32 mmol/L   Glucose, Bld 353 (H) 70 - 99 mg/dL    Comment: Glucose reference range applies only to samples taken after fasting for at least 8 hours.   BUN 32 (H) 8 - 23 mg/dL   Creatinine, Ser 8.47 (H) 0.61 - 1.24 mg/dL   Calcium  7.9 (L) 8.9 - 10.3 mg/dL   GFR, Estimated 47 (L) >60 mL/min    Comment: (NOTE) Calculated using the CKD-EPI Creatinine Equation (2021)    Anion gap 15 5 - 15    Comment: Performed at [facility], [address], [place], [place] [address]  Magnesium      Status: None   Collection Time: 02/08/23  6:00 AM  Result Value Ref Range   Magnesium  2.1 1.7 - 2.4 mg/dL    Comment: Performed at [facility], [address] [place], [place] [address]  Phosphorus     Status: None   Collection Time: 02/08/23  6:00 AM  Result Value Ref Range   Phosphorus 3.6 2.5 - 4.6 mg/dL    Comment: Performed at [facility], [address], [place], [place] [address]  CBG monitoring, ED     Status: Abnormal   Collection Time: 02/08/23  7:49 AM  Result Value Ref Range   Glucose-Capillary 240 (H) 70 - 99 mg/dL    Comment: Glucose reference range applies only to samples taken after fasting for at least 8 hours.  CBG monitoring, ED     Status: Abnormal   Collection Time: 02/08/23  9:10 AM  Result Value Ref Range   Glucose-Capillary 193 (H) 70 - 99 mg/dL    Comment: Glucose reference range applies only to samples taken after fasting for at least 8 hours.  CBG monitoring, ED     Status: Abnormal   Collection Time: 02/08/23 10:28 AM  Result Value Ref Range   Glucose-Capillary 142 (H) 70 - 99 mg/dL    Comment: Glucose reference range applies only to samples taken after fasting for at least 8 hours.  CBG monitoring, ED     Status: Abnormal   Collection Time: 02/08/23 10:50 AM  Result Value Ref Range   Glucose-Capillary 138 (H) 70 - 99 mg/dL    Comment: Glucose reference range applies only to samples taken after fasting for at least 8 hours.  Basic metabolic panel     Status: Abnormal   Collection Time: 02/08/23 11:14 AM  Result Value Ref Range   Sodium 132 (L) 135 - 145 mmol/L   Potassium 3.6 3.5 - 5.1 mmol/L   Chloride 103 98 - 111 mmol/L   CO2 24 22 - 32 mmol/L   Glucose, Bld 136 (H) 70 - 99 mg/dL    Comment: Glucose reference range applies only to samples taken after fasting for at least 8 hours.   BUN 27 (H) 8 - 23 mg/dL   Creatinine, Ser 8.79 0.61 - 1.24 mg/dL   Calcium  7.9 (L) 8.9 - 10.3 mg/dL   GFR, Estimated >39 >39 mL/min    Comment: (NOTE) Calculated using the CKD-EPI Creatinine Equation (2021)    Anion gap 5 5 - 15    Comment: Performed at [facility], [address] [place], [place] [address]  Beta-hydroxybutyric acid     Status: Abnormal   Collection Time: 02/08/23 11:14 AM  Result Value Ref Range   Beta-Hydroxybutyric Acid 0.87 (H) 0.05 - 0.27  mmol/L    Comment:  Performed at [facility], [address], [place], [place] [address]  CBG monitoring, ED     Status: Abnormal   Collection Time: 02/08/23 11:36 AM  Result Value Ref Range   Glucose-Capillary 138 (H) 70 - 99 mg/dL    Comment: Glucose reference range applies only to samples taken after fasting for at least 8 hours.  CBG monitoring, ED     Status: Abnormal   Collection Time: 02/08/23 12:38 PM  Result Value Ref Range   Glucose-Capillary 127 (H) 70 - 99 mg/dL    Comment: Glucose reference range applies only to samples taken after fasting for at least 8 hours.  CBG monitoring, ED     Status: Abnormal   Collection Time: 02/08/23  1:57 PM  Result Value Ref Range   Glucose-Capillary 115 (H) 70 - 99 mg/dL    Comment: Glucose reference range applies only to samples taken after fasting for at least 8 hours.  Beta-hydroxybutyric acid     Status: Abnormal   Collection Time: 02/08/23  2:00 PM  Result Value Ref Range   Beta-Hydroxybutyric Acid 0.28 (H) 0.05 - 0.27 mmol/L    Comment: Performed at [facility], [address], [place], [place] [address]  Basic metabolic panel     Status: Abnormal   Collection Time: 02/08/23  2:00 PM  Result Value Ref Range   Sodium 134 (L) 135 - 145 mmol/L   Potassium 3.7 3.5 - 5.1 mmol/L   Chloride 104 98 - 111 mmol/L   CO2 25 22 - 32 mmol/L   Glucose, Bld 127 (H) 70 - 99 mg/dL    Comment: Glucose reference range applies only to samples taken after fasting for at least 8 hours.   BUN 23 8 - 23 mg/dL   Creatinine, Ser 8.93 0.61 - 1.24 mg/dL   Calcium  8.1 (L) 8.9 - 10.3 mg/dL   GFR, Estimated >39 >39 mL/min    Comment: (NOTE) Calculated using the CKD-EPI Creatinine Equation (2021)    Anion gap 5 5 - 15    Comment: Performed at [facility], [address] [place], [place] [address]  CBG monitoring, ED     Status: Abnormal   Collection Time: 02/08/23  3:05 PM  Result Value Ref Range    Glucose-Capillary 147 (H) 70 - 99 mg/dL    Comment: Glucose reference range applies only to samples taken after fasting for at least 8 hours.  CBG monitoring, ED     Status: Abnormal   Collection Time: 02/08/23  4:12 PM  Result Value Ref Range   Glucose-Capillary 159 (H) 70 - 99 mg/dL    Comment: Glucose reference range applies only to samples taken after fasting for at least 8 hours.  CBG monitoring, ED     Status: Abnormal   Collection Time: 02/08/23  5:04 PM  Result Value Ref Range   Glucose-Capillary 148 (H) 70 - 99 mg/dL    Comment: Glucose reference range applies only to samples taken after fasting for at least 8 hours.  CBG monitoring, ED     Status: Abnormal   Collection Time: 02/08/23  8:06 PM  Result Value Ref Range   Glucose-Capillary 133 (H) 70 - 99 mg/dL    Comment: Glucose reference range applies only to samples taken after fasting for at least 8 hours.  CBG monitoring, ED     Status: Abnormal   Collection Time: 02/08/23  9:56 PM  Result Value Ref Range   Glucose-Capillary  157 (H) 70 - 99 mg/dL    Comment: Glucose reference range applies only to samples taken after fasting for at least 8 hours.  CBC with Differential/Platelet     Status: Abnormal   Collection Time: 02/09/23  6:54 AM  Result Value Ref Range   WBC 9.3 4.0 - 10.5 K/uL   RBC 3.72 (L) 4.22 - 5.81 MIL/uL   Hemoglobin 13.1 13.0 - 17.0 g/dL   HCT 61.5 (L) 60.9 - 47.9 %   MCV 103.2 (H) 80.0 - 100.0 fL   MCH 35.2 (H) 26.0 - 34.0 pg   MCHC 34.1 30.0 - 36.0 g/dL   RDW 85.7 88.4 - 84.4 %   Platelets 116 (L) 150 - 400 K/uL   nRBC 0.0 0.0 - 0.2 %   Neutrophils Relative % 60 %   Neutro Abs 5.6 1.7 - 7.7 K/uL   Lymphocytes Relative 28 %   Lymphs Abs 2.6 0.7 - 4.0 K/uL   Monocytes Relative 8 %   Monocytes Absolute 0.7 0.1 - 1.0 K/uL   Eosinophils Relative 4 %   Eosinophils Absolute 0.4 0.0 - 0.5 K/uL   Basophils Relative 0 %   Basophils Absolute 0.0 0.0 - 0.1 K/uL   Immature Granulocytes 0 %   Abs  Immature Granulocytes 0.04 0.00 - 0.07 K/uL    Comment: Performed at [facility], [address] [place], [place] [address]  Comprehensive metabolic panel     Status: Abnormal   Collection Time: 02/09/23  6:54 AM  Result Value Ref Range   Sodium 131 (L) 135 - 145 mmol/L   Potassium 4.4 3.5 - 5.1 mmol/L   Chloride 99 98 - 111 mmol/L   CO2 19 (L) 22 - 32 mmol/L   Glucose, Bld 360 (H) 70 - 99 mg/dL    Comment: Glucose reference range applies only to samples taken after fasting for at least 8 hours.   BUN 22 8 - 23 mg/dL   Creatinine, Ser 8.70 (H) 0.61 - 1.24 mg/dL   Calcium  8.2 (L) 8.9 - 10.3 mg/dL   Total Protein 6.4 (L) 6.5 - 8.1 g/dL   Albumin  2.6 (L) 3.5 - 5.0 g/dL   AST 31 15 - 41 U/L   ALT 31 0 - 44 U/L   Alkaline Phosphatase 149 (H) 38 - 126 U/L   Total Bilirubin 1.4 (H) 0.0 - 1.2 mg/dL   GFR, Estimated 58 (L) >60 mL/min    Comment: (NOTE) Calculated using the CKD-EPI Creatinine Equation (2021)    Anion gap 13 5 - 15    Comment: Performed at [facility], [address], [place], [place] [address]  Phosphorus     Status: None   Collection Time: 02/09/23  6:54 AM  Result Value Ref Range   Phosphorus 3.2 2.5 - 4.6 mg/dL    Comment: Performed at [facility], [address] [place], [place] [address]  Magnesium      Status: None   Collection Time: 02/09/23  6:54 AM  Result Value Ref Range   Magnesium  2.0 1.7 - 2.4 mg/dL    Comment: Performed at [facility], [address], [place], [place] [address]    DG Foot 2 Views Left Result Date: 02/08/2023 CLINICAL DATA:  Type 1 diabetic with Charcot arthropathy.  Pain. EXAM: LEFT FOOT - 2 VIEW COMPARISON:  01/30/2023 FINDINGS: Midfoot fusion and first toe amputation. There is a bandage over the great toe amputation site without soft tissue  gas or emphysema. Chronic midfoot collapse. Remote second and third metatarsal fractures. Generalized osteopenia.  IMPRESSION: No acute osseous finding. Electronically Signed   By: Dorn Roulette [name].D.   On: 02/08/2023 10:07   DG Foot Complete Left Result Date: 02/07/2023 Please see detailed radiograph report in office note.   Review of Systems  Constitutional:  Negative for chills, fever and malaise/fatigue.  Respiratory:  Negative for shortness of breath.   Cardiovascular:  Negative for chest pain.  Musculoskeletal:  Negative for joint pain.  All other systems reviewed and are negative.  Blood pressure (!) 179/76, pulse 82, temperature 97.9 F (36.6 C), resp. rate (!) 27, SpO2 97%.  Vitals:   02/09/23 0500 02/09/23 0503  BP: (!) 179/76   Pulse: 82   Resp: (!) 27   Temp:  97.9 F (36.6 C)  SpO2: 97%     General AA&O x3. Normal mood and affect.  Vascular Pulses weakly palpable capillary fill time is good  Neurologic Epicritic sensation grossly absent.  Dermatologic (Wound) Great toe incision well coapted with sutures intact no signs of infection there is a small abrasion on the dorsal second toe  Orthopedic: Motor intact BLE.  Pain on second toe    Assessment/Plan:  Contusion left second toe, postop left great toe amputation -Imaging: Studies independently reviewed.  No fracture of second toe -Dressing change incisions are healing well.  Change bandage on second toe daily with antibiotic ointment and Band-Aid.  Gauze dressing for toe amputation may remain intact until outpatient follow-up -Follow-up in our office with Dr. [name] or Dr. Malvin in 1 week -Will sign off please call the on-call podiatrist with questions or concerns  Juliene JONELLE Medicine 02/09/2023, 8:00 AM   Best available via secure chat for questions or concerns.

## 2023-02-09 NOTE — Progress Notes (Signed)
 PT Cancellation Note  Patient Details Name: [name] Moes Duba Jr. MRN: [id number] DOB: 02-19-1945   Cancelled Treatment:    Reason Eval/Treat Not Completed: Other (comment) (pt just arrived to floor and is being assessed by nursing. Will follow.)   Sylvan Delon Copp PT 02/09/2023  Acute Rehabilitation Services  Office [phone]

## 2023-02-09 NOTE — Progress Notes (Signed)
 PROGRESS NOTE    [name] M Kimery Jr.  FMW:[id number] DOB: 1945/08/03 DOA: 02/07/2023 PCP: Shepard Ade, MD   Brief Narrative:  The patient is a 78 year old Caucasian male with a past medical history significant for bowel to diabetes mellitus type 1, diabetic polyneuropathy, CKD stage IIIb, history of Charcot arthropathy with recent toe amputation, history of MI coronary artery disease status post PCI with stent placement, history of CVA, chronic pain syndrome as well as other comorbidities who was recently admitted from 01/30/2020 to 02/03/2023 for left great toe osteomyelitis and diabetic foot ulcer status post left great toe imitation at the MPJ level on 01/30/2023 by podiatry who presented to the ED via EMS from SNF due to severely elevated blood sugar.  Per the patient he was not receiving his insulin  regularly at his facility and states the blood sugars been running anywhere between 400-600 at the facility.  In the ED he was noted be tachycardic and tachypneic and CBG was severely elevated beta hydroxybutyrate acid was greater than 5 and then subsequently worsened to 0.7.  He was initiated on DKA protocol and insulin  drip was initiated given IV fluid hydration.  He is admitted for DKA and hyperglycemia and is improving.  He is complaining of some left foot pain and states that he may have hit his foot and so an x-ray has been done.  Podiatry is been consulted given his recent toe amputation.  Currently he is being admitted and treated for the following but not limited to:  Assessment and Plan:  DKA type I, unclear trigger -The patient states he was not receiving his insulin  regularly at the facility and has a Hx of DM1 -Recently treated for left foot osteomyelitis status post left great toe amputation, currently on Augmentin  twice daily, end treatment date 02/10/23 and we have resumed home oral antibiotics with prior to admission probiotics -DKA protocol was initiated with insulin  drip, IV fluid  hydration, BMP every 4 hours, hydroxybutyrate acid every 8 hours -Replete electrolytes as indicated -Last Hemoglobin A1c 5.8 on 10/11/2022 -Obtain repeat hemoglobin A1c and STILL PENDING  -Beta-Hydroxybutyric Acid Level trended up to 7.01 and now is 0.218  -[name] gap closed and he was given IV fluid hydration and he is improved so he is transitioned to long-acting insulin  Semglee  and increased to 18 units in the AM and Added 10 units Semglee  at bedtime -C/w Moderate Novolog  SSI AC/HS and added Novolog  4 units TIDwm -CBG Trend: Recent Labs  Lab 02/08/23 1612 02/08/23 1704 02/08/23 2006 02/08/23 2156 02/09/23 0816 02/09/23 1218 02/09/23 1618  GLUCAP 159* 148* 133* 157* 399* 309* 145*   Mild anion gap metabolic acidosis in the setting of DKA -Serum bicarbonate is 19, AG is 13, Chloride Level is 99 -Metabolic Acidosis had resolved CO2 of 25, anion gap of 5, chloride level of 104   Isolated elevated alkaline phosphatase in the setting of recent osteomyelitis, improving  -Alk Phos Trend: Recent Labs  Lab 01/30/23 1334 02/07/23 2336 02/09/23 0654  ALKPHOS 229* 195* 149*  -C/w Augmentin  for completion of Abx  -Continue to Monitor and Trend and Repaet CMP in the AM    Hyperbilirubinemia, improving  -Mild and Likely reactive -Bilirubin Trend: Recent Labs  Lab 01/30/23 1334 02/07/23 2336 02/09/23 0654  BILITOT 0.7 2.0* 1.4*  -Continue to Monitor and Trend and repeat CMP in the AM  Hyponatremia/Pseudohyponatremia Recent Labs  Lab 02/03/23 0522 02/07/23 2336 02/07/23 2355 02/08/23 0600 02/08/23 1114 02/08/23 1400 02/09/23 0654  NA 138 130*  133* 131* 132* 134* 131*  -Low due to elevated Blood Sugar -Continue to Monitor and Trend and Repeat CMP in the AM    Diabetic Polyneuropathy -Minimal sensory input from ankle down bilaterally. -Fall precautions -Resume home Duloxetine  and other Pain meds as below    Coronary artery disease status post PCI with stent  placement -Resume home aspirin , Plavix , and Lipitor -No evidence of acute ischemia on 12-lead EKG -Continue to Monitor on telemetry   BPH -Resume home Finasteride  5 mg po Every Morning  -Continue to Monitor urine output   History of CVA -Resume home Aspirin  and Lipitor for secondary CVA prevention   Left foot Charcot Arthropathy Recent surgical procedure -Consulted Dr. Silva for evaluation Status post left great toe amputation at MPJ level on 01/30/2023 by Dr. Malvin. -X-Ray done and showed Midfoot fusion and first toe amputation. There is a bandage over the great toe amputation site without soft tissue gas or emphysema. Chronic midfoot collapse. Remote second and third metatarsal fractures. Generalized osteopenia.   Chronic Pain Syndrome -Resume home regimen and restarted Nucynta  200 mg po q12h -C/w oxycodone  IR 15 mg every 6 as needed for severe pain and acetaminophen  650 mg p.o. every 6 as needed for mild pain or fever   AKI on CKD 3B, improved  -Presented with creatinine 1.5 with GFR 45. -Appears to be at his baseline -BUN/Cr Trend: Recent Labs  Lab 02/03/23 0522 02/07/23 2336 02/07/23 2355 02/08/23 0600 02/08/23 1114 02/08/23 1400 02/09/23 0654  BUN 21 28* 28* 32* 27* 23 22  CREATININE 1.37* 1.58* 1.40* 1.52* 1.20 1.06 1.29*  -Continue to Monitor UOP -Avoid Nephrotoxic Medications, Contrast Dyes, Hypotension and Dehydration to Ensure Adequate Renal Perfusion and will need to Renally Adjust Meds -Continue to Monitor and Trend Renal Function carefully and repeat CMP in the AM   Pressure wounds -Wound care specialist consulted and see their recommendations -Local wound care with the guidance of wound care specialist   Situational Insomnia -Resume home Trazodone    Essential Hypertension -BP is not at goal, elevated -Resume home p.o. Lopressor  -Continue to Monitor vital signs per Protocol -Last BP reading was 141/65  Hypoalbuminemia -[name] Albumin   Trend: Recent Labs  Lab 01/30/23 1334 02/07/23 2336 02/09/23 0654  ALBUMIN  2.8* 3.1* 2.6*  -Continue to Monitor and Trend and repeat CMP in the AM  Overweight -Complicates overall prognosis and care -Estimated body mass index is 26.64 kg/m as calculated from the following:   Height as of this encounter: 6' (1.829 m).   Weight as of this encounter: 89.1 kg.  -Weight Loss and Dietary Counseling given   DVT prophylaxis: enoxaparin  (LOVENOX ) injection 40 mg Start: 02/08/23 1000    Code Status: Limited: Do not attempt resuscitation (DNR) -DNR-LIMITED -Do Not Intubate/DNI  Family Communication: No family present at bedside  Disposition Plan:  Level of care: Telemetry Status is: Inpatient Remains inpatient appropriate because: Needs further Pain control and Blood Sugar Control and will need SNF as PT/OT recommending SNF   Consultants:  Podiatry WOC Nurse Diabetes Education Coordinator   Procedures:  As delineated as above   Antimicrobials:  Anti-infectives (From admission, onward)    Start     Dose/Rate Route Frequency Ordered Stop   02/08/23 1000  amoxicillin -clavulanate (AUGMENTIN ) 875-125 MG per tablet 1 tablet        1 tablet Oral Every 12 hours 02/08/23 0513         Subjective: Seen and examined at bedside and was complaining of significant pain.  Blood sugars were also elevated and he felt generally unwell due to his uncontrolled pain.  Had nausea earlier but no vomiting.  No other concerns or complaints at this time.  Objective: Vitals:   02/09/23 0912 02/09/23 1000 02/09/23 1121 02/09/23 1419  BP: 131/63 (!) 147/68 (!) 148/57 (!) 141/65  Pulse: (!) 109 (!) 107 97 (!) 108  Resp:  17 18 18   Temp:   97.9 F (36.6 C) 98.3 F (36.8 C)  TempSrc:    Oral  SpO2:  98% 100% 99%  Weight:    89.1 kg  Height:    6' (1.829 m)    Intake/Output Summary (Last 24 hours) at 02/09/2023 1744 Last data filed at 02/09/2023 1059 Gross per 24 hour  Intake --  Output 650 ml  Net  -650 ml   Filed Weights   02/09/23 1419  Weight: 89.1 kg   Examination: Physical Exam:  Constitutional: Overweight chronically ill-appearing elderly Caucasian male who appears uncomfortable and in Pain Respiratory: Diminished to auscultation bilaterally, no wheezing, rales, rhonchi or crackles. Normal respiratory effort and patient is not tachypenic. No accessory muscle use.  Unlabored breathing Cardiovascular: Tachycardic rate but regular rhythm, no murmurs / rubs / gallops. S1 and S2 auscultated.  Mild extremity edema Abdomen: Soft, non-tender, distant secondary body habitus. Bowel sounds positive.  GU: Deferred. Musculoskeletal: No clubbing / cyanosis of digits/nails.  Has a recent toe amputation on the left Skin: No rashes, lesions, ulcers. No induration; Warm and dry.  Neurologic: CN 2-12 grossly intact with no focal deficits. Romberg sign and cerebellar reflexes not assessed.  Psychiatric: Normal judgment and insight. Alert and oriented x 3. Anxious Mood  Data Reviewed: I have personally reviewed following labs and imaging studies  CBC: Recent Labs  Lab 02/03/23 0522 02/07/23 2336 02/07/23 2355 02/09/23 0654  WBC 6.0 10.9*  --  9.3  NEUTROABS 3.5 8.0*  --  5.6  HGB 12.9* 13.2 13.9 13.1  HCT 37.9* 40.3 41.0 38.4*  MCV 100.3* 102.5*  --  103.2*  PLT 79* 119*  --  116*   Basic Metabolic Panel: Recent Labs  Lab 02/07/23 2336 02/07/23 2355 02/08/23 0600 02/08/23 1114 02/08/23 1400 02/09/23 0654  NA 130* 133* 131* 132* 134* 131*  K 4.6 4.9 4.1 3.6 3.7 4.4  CL 97* 99 100 103 104 99  CO2 19*  --  16* 24 25 19*  GLUCOSE 480* 474* 353* 136* 127* 360*  BUN 28* 28* 32* 27* 23 22  CREATININE 1.58* 1.40* 1.52* 1.20 1.06 1.29*  CALCIUM  8.3*  --  7.9* 7.9* 8.1* 8.2*  MG  --   --  2.1  --   --  2.0  PHOS  --   --  3.6  --   --  3.2   GFR: Estimated Creatinine Clearance: 54.3 mL/min (A) (by C-G formula based on SCr of 1.29 mg/dL (H)). Liver Function Tests: Recent Labs   Lab 02/07/23 2336 02/09/23 0654  AST 37 31  ALT 41 31  ALKPHOS 195* 149*  BILITOT 2.0* 1.4*  PROT 7.6 6.4*  ALBUMIN  3.1* 2.6*   No results for input(s): LIPASE, AMYLASE in the last 168 hours. No results for input(s): AMMONIA in the last 168 hours. Coagulation Profile: No results for input(s): INR, PROTIME in the last 168 hours. Cardiac Enzymes: No results for input(s): CKTOTAL, CKMB, CKMBINDEX, TROPONINI in the last 168 hours. BNP (last 3 results) No results for input(s): PROBNP in the last 8760 hours. HbA1C: No results  for input(s): HGBA1C in the last 72 hours. CBG: Recent Labs  Lab 02/08/23 2006 02/08/23 2156 02/09/23 0816 02/09/23 1218 02/09/23 1618  GLUCAP 133* 157* 399* 309* 145*   Lipid Profile: No results for input(s): CHOL, HDL, LDLCALC, TRIG, CHOLHDL, LDLDIRECT in the last 72 hours. Thyroid  Function Tests: No results for input(s): TSH, T4TOTAL, FREET4, T3FREE, THYROIDAB in the last 72 hours. Anemia Panel: No results for input(s): VITAMINB12, FOLATE, FERRITIN, TIBC, IRON, RETICCTPCT in the last 72 hours. Sepsis Labs: No results for input(s): PROCALCITON, LATICACIDVEN in the last 168 hours.  No results found for this or any previous visit (from the past 240 hours).   Radiology Studies: DG Foot 2 Views Left Result Date: 02/08/2023 CLINICAL DATA:  Type 1 diabetic with Charcot arthropathy.  Pain. EXAM: LEFT FOOT - 2 VIEW COMPARISON:  01/30/2023 FINDINGS: Midfoot fusion and first toe amputation. There is a bandage over the great toe amputation site without soft tissue gas or emphysema. Chronic midfoot collapse. Remote second and third metatarsal fractures. Generalized osteopenia. IMPRESSION: No acute osseous finding. Electronically Signed   By: Dorn Roulette M.D.   On: 02/08/2023 10:07   Scheduled Meds:  amoxicillin -clavulanate  1 tablet Oral Q12H   aspirin   81 mg Oral Daily   atorvastatin   10 mg Oral  QHS   clopidogrel   75 mg Oral Daily   DULoxetine   60 mg Oral BID   enoxaparin  (LOVENOX ) injection  40 mg Subcutaneous Q24H   finasteride   5 mg Oral q morning   insulin  aspart  0-15 Units Subcutaneous TID WC   insulin  aspart  0-5 Units Subcutaneous QHS   [START ON 02/10/2023] insulin  aspart  4 Units Subcutaneous TID WC   insulin  glargine-yfgn  10 Units Subcutaneous QHS   insulin  glargine-yfgn  18 Units Subcutaneous Daily   lactobacillus  1 g Oral TID WC   metoprolol  tartrate  12.5 mg Oral BID   polyethylene glycol  17 g Oral BID   senna-docusate  1 tablet Oral BID   tapentadol   200 mg Oral Q12H   traZODone   150 mg Oral QHS   Continuous Infusions:   LOS: 1 day   Alejandro Marker, DO [facility] Available via Epic secure chat 7am-7pm After these hours, please refer to coverage provider listed on [email or web address] 02/09/2023, 5:44 PM

## 2023-02-09 NOTE — Evaluation (Signed)
 Physical Therapy Evaluation Patient Details Name: [name] Krzyzanowski Pulver Jr. MRN: [id number] DOB: 09-10-1945 Today's Date: 02/09/2023  History of Present Illness  Pt is a 78 y/o male admitted 02/07/23 from [facility] with DKA.  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 He is WBAT in the post-op shoe. 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  Clinical Impression  Pt admitted with above diagnosis. Pt reports he had to call 911 from his rehab facility because nobody was responding to his call bell for over 2 hours, and that his diabetes was not being managed there. At baseline (prior to L toe amputation) he ambulated 8'-10' with a rollator. He stated he wasn't able to participate in PT/OT at ST-SNF due to poor diabetes management. Today he was able to transfer bed to recliner with a RW and min assist. His wife is bringing in his postop shoe. Good progress expected. Pt currently with functional limitations due to the deficits listed below (see PT Problem List). Pt will benefit from acute skilled PT to increase their independence and safety with mobility to allow discharge.           If plan is discharge home, recommend the following: Two people to help with walking and/or transfers;A lot of help with bathing/dressing/bathroom;Assistance with cooking/housework;Assist for transportation;Help with stairs or ramp for entrance   Can travel by private vehicle   No    Equipment Recommendations None recommended by PT  Recommendations for Other Services       Functional Status Assessment Patient has had a recent decline in their functional status and demonstrates the ability to make significant improvements in function in a  reasonable and predictable amount of time.     Precautions / Restrictions Precautions Precautions: Fall Required Braces or Orthoses: Other Brace Other Brace: Post-op shoe for B feet (these are at [facility], wife to bring them) Restrictions Weight Bearing Restrictions Per Provider Order: Yes LLE Weight Bearing Per Provider Order: Weight bearing as tolerated Other Position/Activity Restrictions: In post-op shoe.      Mobility  Bed Mobility Overal bed mobility: Needs Assistance Bed Mobility: Supine to Sit     Supine to sit: Min assist, HOB elevated, Used rails     General bed mobility comments: assist to raise trunk and advance LLE to edge of bed    Transfers Overall transfer level: Needs assistance Equipment used: Rolling walker (2 wheels) Transfers: Sit to/from Stand, Bed to chair/wheelchair/BSC Sit to Stand: Min assist, From elevated surface Stand pivot transfers: Min assist         General transfer comment: Pt pivoted to recliner with RW with VCs to minimize WBing on LLE as he does not have post op shoe here yet. Pt with trunk sway L and R but no overt loss of balance. VCs for safe hand placement    Ambulation/Gait                  Stairs            Wheelchair Mobility     Tilt Bed    Modified Rankin (Stroke Patients Only)       Balance Overall balance assessment: Needs assistance Sitting-balance support: Feet supported, No upper extremity supported  Sitting balance-Leahy Scale: Fair Sitting balance - Comments: multidirectional trunk sway but no loss of balance Postural control: Posterior lean Standing balance support: Reliant on assistive device for balance, During functional activity, Bilateral upper extremity supported Standing balance-Leahy Scale: Poor                               Pertinent Vitals/Pain Pain Assessment Faces Pain Scale: Hurts little more Pain Location: L thigh and groin Pain Descriptors / Indicators:  Sore Pain Intervention(s): Limited activity within patient's tolerance, Monitored during session, Repositioned, Patient requesting pain meds-RN notified    Home Living Family/patient expects to be discharged to:: Skilled nursing facility Living Arrangements: Spouse/significant other Available Help at Discharge: Family;Available 24 hours/day Type of Home: House Home Access: Ramped entrance       Home Layout: One level Home Equipment: Hand held shower head;Electric scooter;Wheelchair Financial [employer]ader (4 wheels);Wheelchair - power Additional Comments: uses rollator and scooter, electric w/c too wide to fit through his home's doorways    Prior Function Prior Level of Function : Needs assist             Mobility Comments: Prior to L great toe amputation, he could walk 8-10' at a time with rollator. Uses wheelchair or scooter for longer distances. Falls at home PTA ADLs Comments: Wife assist with sponge bath, pt does not get in the shower anymore. Wife sets out clothes for him and pt states he can dress himself.     Extremity/Trunk Assessment   Upper Extremity Assessment Upper Extremity Assessment: Defer to OT evaluation    Lower Extremity Assessment Lower Extremity Assessment: Generalized weakness RLE Deficits / Details: Bilaterally, numb in feet and up through lower 3rd of leg. Fair strength with MMT (4/5 grossly); pt reports he cannot dorsi/platarflex B ankles    Cervical / Trunk Assessment Cervical / Trunk Assessment: Kyphotic  Communication   Communication Communication: No apparent difficulties  Cognition Arousal: Alert Behavior During Therapy: WFL for tasks assessed/performed Overall Cognitive Status: Within Functional Limits for tasks assessed                                          General Comments      Exercises     Assessment/Plan    PT Assessment Patient needs continued PT services  PT Problem List Decreased strength;Decreased  activity tolerance;Decreased balance;Decreased mobility;Decreased knowledge of use of DME;Decreased safety awareness;Decreased knowledge of precautions;Pain;Impaired sensation       PT Treatment Interventions DME instruction;Gait training;Stair training;Functional mobility training;Therapeutic activities;Therapeutic exercise;Balance training;Patient/family education    PT Goals (Current goals can be found in the Care Plan section)  Acute Rehab PT Goals Patient Stated Goal: to get strong enough to go home PT Goal Formulation: With patient Time For Goal Achievement: 02/23/23 Potential to Achieve Goals: Good    Frequency Min 1X/week     Co-evaluation               AM-PAC PT 6 Clicks Mobility  Outcome Measure Help needed turning from your back to your side while in a flat bed without using bedrails?: A Little Help needed moving from lying on your back to sitting on the side of a flat bed without using bedrails?: A Little Help needed moving to and from a bed to a chair (including a wheelchair)?: A Little Help needed  standing up from a chair using your arms (e.g., wheelchair or bedside chair)?: A Lot Help needed to walk in hospital room?: A Lot Help needed climbing 3-5 steps with a railing? : Total 6 Click Score: 14    End of Session Equipment Utilized During Treatment: Gait belt Activity Tolerance: Patient tolerated treatment well Patient left: in chair;with call bell/phone within reach Nurse Communication: Mobility status;Patient requests pain meds PT Visit Diagnosis: Unsteadiness on feet (R26.81);Difficulty in walking, not elsewhere classified (R26.2)    Time: 8570-8548 PT Time Calculation (min) (ACUTE ONLY): 22 min   Charges:   PT Evaluation $PT Eval Moderate Complexity: 1 Mod   PT General Charges $$ ACUTE PT VISIT: 1 Visit         Sylvan Delon Copp PT 02/09/2023  Acute Rehabilitation Services  Office [phone]

## 2023-02-09 NOTE — Telephone Encounter (Signed)
 Called pts wife and pt is currently at [facility] because after surgery he was released to [facility] rehab and had been given the wrong insulin  and his sugar is all messed up. He is already scheduled to see Dr [name] on 1/6, I told pts wife to call me tomorrow if not discharged and we can go from there.

## 2023-02-10 DIAGNOSIS — E1110 Type 2 diabetes mellitus with ketoacidosis without coma: Secondary | ICD-10-CM

## 2023-02-10 LAB — GLUCOSE, CAPILLARY
Glucose-Capillary: 159 mg/dL — ABNORMAL HIGH (ref 70–99)
Glucose-Capillary: 53 mg/dL — ABNORMAL LOW (ref 70–99)
Glucose-Capillary: 64 mg/dL — ABNORMAL LOW (ref 70–99)
Glucose-Capillary: 207 mg/dL — ABNORMAL HIGH (ref 70–99)
Glucose-Capillary: 189 mg/dL — ABNORMAL HIGH (ref 70–99)

## 2023-02-10 LAB — COMPREHENSIVE METABOLIC PANEL
ALT: 24 U/L (ref 0–44)
AST: 24 U/L (ref 15–41)
Albumin: 2.3 g/dL — ABNORMAL LOW (ref 3.5–5.0)
Alkaline Phosphatase: 125 U/L (ref 38–126)
Anion gap: 6 (ref 5–15)
BUN: 20 mg/dL (ref 8–23)
CO2: 24 mmol/L (ref 22–32)
Calcium: 8.1 mg/dL — ABNORMAL LOW (ref 8.9–10.3)
Chloride: 106 mmol/L (ref 98–111)
Creatinine, Ser: 0.95 mg/dL (ref 0.61–1.24)
GFR, Estimated: >60 mL/min (ref >60)
Glucose, Bld: 169 mg/dL — ABNORMAL HIGH (ref 70–99)
Potassium: 3.9 mmol/L (ref 3.5–5.1)
Sodium: 136 mmol/L (ref 135–145)
Total Bilirubin: 0.8 mg/dL (ref 0.0–1.2)
Total Protein: 5.7 g/dL — ABNORMAL LOW (ref 6.5–8.1)

## 2023-02-10 LAB — CBC WITH DIFFERENTIAL/PLATELET
Abs Immature Granulocytes: 0.02 10*3/uL (ref 0.00–0.07)
Basophils Absolute: 0 10*3/uL (ref 0.0–0.1)
Basophils Relative: 1 %
Eosinophils Absolute: 0.3 10*3/uL (ref 0.0–0.5)
Eosinophils Relative: 5 %
HCT: 36.3 % — ABNORMAL LOW (ref 39.0–52.0)
Hemoglobin: 12 g/dL — ABNORMAL LOW (ref 13.0–17.0)
Immature Granulocytes: 0 %
Lymphocytes Relative: 31 %
Lymphs Abs: 1.8 10*3/uL (ref 0.7–4.0)
MCH: 34.3 pg — ABNORMAL HIGH (ref 26.0–34.0)
MCHC: 33.1 g/dL (ref 30.0–36.0)
MCV: 103.7 fL — ABNORMAL HIGH (ref 80.0–100.0)
Monocytes Absolute: 0.6 10*3/uL (ref 0.1–1.0)
Monocytes Relative: 10 %
Neutro Abs: 3.1 10*3/uL (ref 1.7–7.7)
Neutrophils Relative %: 53 %
Platelets: 103 10*3/uL — ABNORMAL LOW (ref 150–400)
RBC: 3.5 MIL/uL — ABNORMAL LOW (ref 4.22–5.81)
RDW: 14.1 % (ref 11.5–15.5)
WBC: 5.9 10*3/uL (ref 4.0–10.5)
nRBC: 0 % (ref 0.0–0.2)

## 2023-02-10 LAB — PHOSPHORUS: Phosphorus: 3.2 mg/dL (ref 2.5–4.6)

## 2023-02-10 LAB — HEMOGLOBIN A1C
Hgb A1c MFr Bld: 6.5 % — ABNORMAL HIGH (ref 4.8–5.6)
Mean Plasma Glucose: 140 mg/dL

## 2023-02-10 LAB — MAGNESIUM: Magnesium: 1.9 mg/dL (ref 1.7–2.4)

## 2023-02-10 MED ORDER — METOPROLOL TARTRATE 5 MG/5ML IV SOLN: 5.0000 mg | INTRAVENOUS | Status: DC | PRN

## 2023-02-10 MED ORDER — GUAIFENESIN 100 MG/5ML PO LIQD: 5.0000 mL | ORAL | Status: DC | PRN

## 2023-02-10 MED ORDER — OXYCODONE HCL 15 MG PO TABS: 15.0000 mg | ORAL_TABLET | Freq: Four times a day (QID) | ORAL | 0 refills | Status: DC | PRN

## 2023-02-10 MED ORDER — SENNOSIDES-DOCUSATE SODIUM 8.6-50 MG PO TABS: 1.0000 | ORAL_TABLET | Freq: Every evening | ORAL | Status: DC | PRN

## 2023-02-10 MED ORDER — GLUCERNA SHAKE PO LIQD
237.0000 mL | Freq: Three times a day (TID) | ORAL | Status: DC
  Administered 2023-02-10 – 2023-02-13 (×5): 237 mL via ORAL
  Filled 2023-02-10 (×11): qty 237

## 2023-02-10 MED ORDER — IPRATROPIUM-ALBUTEROL 0.5-2.5 (3) MG/3ML IN SOLN: 3.0000 mL | RESPIRATORY_TRACT | Status: DC | PRN

## 2023-02-10 MED ORDER — GLUCAGON HCL RDNA (DIAGNOSTIC) 1 MG IJ SOLR
1.0000 mg | INTRAMUSCULAR | Status: AC | PRN
  Administered 2023-02-10: 1 mg via INTRAVENOUS
  Filled 2023-02-10: qty 1

## 2023-02-10 MED ORDER — HYDRALAZINE HCL 20 MG/ML IJ SOLN
10.0000 mg | INTRAMUSCULAR | Status: DC | PRN
  Administered 2023-02-13: 10 mg via INTRAVENOUS
  Filled 2023-02-10: qty 1

## 2023-02-10 NOTE — Plan of Care (Signed)
  Problem: Clinical Measurements: Goal: Diagnostic test results will improve Outcome: Progressing   Problem: Pain Management: Goal: General experience of comfort will improve Outcome: Progressing   Problem: Safety: Goal: Ability to remain free from injury will improve Outcome: Progressing

## 2023-02-10 NOTE — Progress Notes (Signed)
 Physical Therapy Treatment Patient Details Name: [name] Beneke Blea Jr. MRN: [id number] DOB: September 28, 1945 Today's Date: 02/10/2023   History of Present Illness Pt is a 78 y/o male admitted 02/07/23 from [facility] ST-SNF with DKA.  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 He is WBAT in the post-op shoe. 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    PT Comments  Pt reports his wife has not brought in his post op shoe. Pt took several pivotal steps from bed to recliner with RW with min assist for balance 2* multidirectional trunk sway. Pt had poorly controlled descent to recliner.     If plan is discharge home, recommend the following: A lot of help with bathing/dressing/bathroom;Assistance with cooking/housework;Assist for transportation;Help with stairs or ramp for entrance;A lot of help with walking and/or transfers   Can travel by private vehicle     No  Equipment Recommendations  None recommended by PT    Recommendations for Other Services       Precautions / Restrictions Precautions Precautions: Fall Required Braces or Orthoses: Other Brace Other Brace: Post-op shoe for B feet (these are at [facility], wife to bring them) Restrictions Weight Bearing Restrictions Per Provider Order: No LLE Weight Bearing Per Provider Order: Weight bearing as tolerated Other Position/Activity Restrictions: In post-op shoe.     Mobility  Bed Mobility   Bed Mobility: Supine to Sit     Supine to sit: Min assist, HOB elevated, Used rails     General bed mobility comments: assist to raise trunk and advance LLE to edge of bed    Transfers Overall transfer level: Needs assistance Equipment used: Rolling walker (2  wheels) Transfers: Sit to/from Stand, Bed to chair/wheelchair/BSC Sit to Stand: Min assist, From elevated surface   Step pivot transfers: Min assist       General transfer comment: Pt pivoted to recliner with RW with VCs to minimize WBing on LLE as he does not have post op shoe here yet. Pt with trunk sway L and R but no overt loss of balance. VCs for safe hand placement. Min steadying assist.    Ambulation/Gait                   Stairs             Wheelchair Mobility     Tilt Bed    Modified Rankin (Stroke Patients Only)       Balance Overall balance assessment: Needs assistance Sitting-balance support: Feet supported, No upper extremity supported Sitting balance-Leahy Scale: Fair Sitting balance - Comments: multidirectional trunk sway but no loss of balance Postural control: Posterior lean Standing balance support: Reliant on assistive device for balance, During functional activity, Bilateral upper extremity supported Standing balance-Leahy Scale: Poor                              Cognition Arousal: Alert Behavior During Therapy: WFL for tasks assessed/performed Overall Cognitive Status: Within Functional Limits for tasks assessed  Exercises      General Comments        Pertinent Vitals/Pain Pain Assessment Faces Pain Scale: No hurt    Home Living                          Prior Function            PT Goals (current goals can now be found in the care plan section) Acute Rehab PT Goals Patient Stated Goal: to get strong enough to go home PT Goal Formulation: With patient Time For Goal Achievement: 02/23/23 Potential to Achieve Goals: Good Progress towards PT goals: Progressing toward goals    Frequency    Min 1X/week      PT Plan      Co-evaluation              AM-PAC PT 6 Clicks Mobility   Outcome Measure  Help needed turning from  your back to your side while in a flat bed without using bedrails?: A Little Help needed moving from lying on your back to sitting on the side of a flat bed without using bedrails?: A Little Help needed moving to and from a bed to a chair (including a wheelchair)?: A Little Help needed standing up from a chair using your arms (e.g., wheelchair or bedside chair)?: A Lot Help needed to walk in hospital room?: A Lot Help needed climbing 3-5 steps with a railing? : Total 6 Click Score: 14    End of Session Equipment Utilized During Treatment: Gait belt Activity Tolerance: Patient tolerated treatment well Patient left: in chair;with call bell/phone within reach Nurse Communication: Mobility status PT Visit Diagnosis: Unsteadiness on feet (R26.81);Difficulty in walking, not elsewhere classified (R26.2)     Time: 8591-8582 PT Time Calculation (min) (ACUTE ONLY): 9 min  Charges:    $Therapeutic Activity: 8-22 mins PT General Charges $$ ACUTE PT VISIT: 1 Visit                     Sylvan Delon Copp PT 02/10/2023  Acute Rehabilitation Services  Office [phone]

## 2023-02-10 NOTE — Evaluation (Signed)
 Occupational Therapy Evaluation Patient Details Name: [name] Dedman Perra Jr. MRN: [id number] DOB: Nov 27, 1945 Today's Date: 02/10/2023   History of Present Illness Pt is a 78 yr old male admitted 02/07/23 from [facility] SNF rehab with DKA.  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 He is WBAT in the post-op shoe. 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   Clinical Impression   The pt is currently limited by the below listed deficits which compromise his ADL performance and overall functional independence (see OT problem list). At current, he requires assist for tasks, including bed mobility, dressing, and toileting. He reported chronic total numbness of his feet due to neuropathy, as well as chronic fine motor coordination and motor control deficits of his L UE.  He also appears to be with general deconditioning and slight generalized weakness. He will benefit from further OT services to maximize his ADL performance and to decrease the risk for restricted participation in meaningful activities. Patient will benefit from continued inpatient follow up therapy, <3 hours/day.       If plan is discharge home, recommend the following: Assist for transportation;Assistance with cooking/housework;A lot of help with walking and/or transfers;A lot of help with bathing/dressing/bathroom;Help with stairs or ramp for entrance    Functional Status Assessment  Patient has had a recent decline in their functional status and demonstrates the ability to make significant improvements in function in a reasonable and predictable amount of time.  Equipment Recommendations  Other (comment) (defer to next level of care)    Recommendations for  Other Services       Precautions / Restrictions Precautions Precautions: Fall Required Braces or Orthoses: Other Brace Other Brace: Post-op shoe for B feet (these are at [facility], wife to bring them) Restrictions Weight Bearing Restrictions Per Provider Order: No LLE Weight Bearing Per Provider Order: Weight bearing as tolerated Other Position/Activity Restrictions: In post-op shoe.      Mobility Bed Mobility Overal bed mobility: Needs Assistance Bed Mobility: Rolling Rolling: Min assist, Used rails         General bed mobility comments: he further required min assist to scoot to the head of the bed            ADL either performed or assessed with clinical judgement   ADL Overall ADL's : Needs assistance/impaired Eating/Feeding: Set up;Sitting Eating/Feeding Details (indicate cue type and reason): Limited by impaired coordination of LUE Grooming: Minimal assistance;Sitting           Upper Body Dressing : Minimal assistance;Sitting;Bed level   Lower Body Dressing: Maximal assistance;Moderate assistance                        Pertinent Vitals/Pain Pain Assessment Pain Assessment: No/denies pain     Extremity/Trunk Assessment Upper Extremity Assessment Upper Extremity Assessment: Right hand dominant;RUE deficits/detail;LUE deficits/detail RUE Deficits / Details: AROM WFL. Functional grip strength LUE Deficits / Details: AROM WFL. Functional grip strength. Chronic fine motor coordination deficits with decreased motor control and presumed dysmetria   Lower Extremity Assessment Lower Extremity Assessment: Generalized weakness;LLE deficits/detail;RLE deficits/detail RLE Sensation: history of peripheral neuropathy (reports total numbness in feet) LLE Sensation: history of peripheral neuropathy (reports total numbness in  feet)       Communication Communication Communication: No apparent difficulties   Cognition Arousal: Alert Behavior During  Therapy: WFL for tasks assessed/performed Overall Cognitive Status: Within Functional Limits for tasks assessed          General Comments               Home Living Family/patient expects to be discharged to:: Skilled nursing facility Living Arrangements: Spouse/significant other Available Help at Discharge: Family Type of Home: House Home Access: Stairs to enter     Home Layout: Two level;Able to live on main level with bedroom/bathroom Alternate Level Stairs-Number of Steps: basement and main level to home   Bathroom Shower/Tub: Tub/shower unit         Home Equipment: Rollator (4 wheels);Electric scooter;Shower seat          Prior Functioning/Environment               Mobility Comments: Reported primary use of rollator and occasional use of scooter. ADLs Comments: Reported being modified independent with toileting and dressing and his spouse assisted with bathing. He does not drive and his spouse manages household chores.        OT Problem List: Decreased strength;Decreased activity tolerance;Impaired balance (sitting and/or standing);Impaired sensation;Decreased knowledge of use of DME or AE;Decreased coordination      OT Treatment/Interventions: Lattin-care/ADL training;Therapeutic activities;Therapeutic exercise;Balance training;Patient/family education;DME and/or AE instruction;Neuromuscular education;Energy conservation    OT Goals(Current goals can be found in the care plan section) Acute Rehab OT Goals Patient Stated Goal: to get better and return home OT Goal Formulation: With patient Time For Goal Achievement: 02/24/23 Potential to Achieve Goals: Good ADL Goals Pt Will Perform Upper Body Dressing: with supervision;sitting Pt Will Perform Lower Body Dressing: with contact guard assist;sitting/lateral leans;with adaptive equipment Pt Will Transfer to Toilet: with contact guard assist;bedside commode Pt Will Perform Toileting - Clothing Manipulation  and hygiene: with contact guard assist;sit to/from stand  OT Frequency: Min 1X/week       AM-PAC OT 6 Clicks Daily Activity     Outcome Measure Help from another person eating meals?: A Little Help from another person taking care of personal grooming?: A Little Help from another person toileting, which includes using toliet, bedpan, or urinal?: A Lot Help from another person bathing (including washing, rinsing, drying)?: A Lot Help from another person to put on and taking off regular upper body clothing?: A Little Help from another person to put on and taking off regular lower body clothing?: A Lot 6 Click Score: 15   End of Session Equipment Utilized During Treatment: Other (comment) (N/A) Nurse Communication: Mobility status  Activity Tolerance: Patient tolerated treatment well Patient left: in bed;with call bell/phone within reach;with bed alarm set  OT Visit Diagnosis: Unsteadiness on feet (R26.81);Muscle weakness (generalized) (M62.81);Other abnormalities of gait and mobility (R26.89)                Time: 1633-1700 OT Time Calculation (min): 27 min Charges:  OT General Charges $OT Visit: 1 Visit OT Evaluation $OT Eval Moderate Complexity: 1 Mod    Adaya Garmany L Shacola Schussler, OTR/L 02/10/2023, 5:34 PM

## 2023-02-10 NOTE — Plan of Care (Signed)
 Hypoglycemic Event  CBG: 56  Treatment: 8 oz juice/soda  Symptoms: None  Follow-up CBG: Time:1130 CBG Result:64  Possible Reasons for Event: Inadequate meal intake  Comments/MD notified:MD Amin Notified 11:15am    Kayron Hicklin Alondra  Mozqueda Jaramillo   Problem: Education: Goal: Knowledge of General Education information will improve Description: Including pain rating scale, medication(s)/side effects and non-pharmacologic comfort measures 02/10/2023 1113 by Mozqueda Jaramillo, Lennart Res, RN Outcome: Progressing 02/10/2023 1113 by Mozqueda Jaramillo, Lennart Res, RN Outcome: Progressing   Problem: Health Behavior/Discharge Planning: Goal: Ability to manage health-related needs will improve 02/10/2023 1113 by Rosamond Mould, Lennart Res, RN Outcome: Progressing 02/10/2023 1113 by Mozqueda Jaramillo, Lennart Res, RN Outcome: Progressing   Problem: Clinical Measurements: Goal: Ability to maintain clinical measurements within normal limits will improve 02/10/2023 1113 by Rosamond Mould, Lennart Res, RN Outcome: Progressing 02/10/2023 1113 by Mozqueda Jaramillo, Lennart Res, RN Outcome: Progressing Goal: Will remain free from infection 02/10/2023 1113 by Rosamond Mould, Lennart Res, RN Outcome: Progressing 02/10/2023 1113 by Mozqueda Jaramillo, Lennart Res, RN Outcome: Progressing Goal: Diagnostic test results will improve 02/10/2023 1113 by Mozqueda Jaramillo, Lennart Res, RN Outcome: Progressing 02/10/2023 1113 by Mozqueda Jaramillo, Nicola Heinemann Alondra, RN Outcome: Progressing Goal: Respiratory complications will improve 02/10/2023 1113 by Mozqueda Jaramillo, Evola Hollis Alondra, RN Outcome: Progressing 02/10/2023 1113 by Mozqueda Jaramillo, Deserai Cansler Alondra, RN Outcome: Progressing Goal: Cardiovascular complication will be avoided 02/10/2023 1113 by Mozqueda Jaramillo, Kaithlyn Teagle Alondra, RN Outcome: Progressing 02/10/2023 1113 by Mozqueda Jaramillo, Flecia Shutter Alondra,  RN Outcome: Progressing   Problem: Activity: Goal: Risk for activity intolerance will decrease 02/10/2023 1113 by Mozqueda Jaramillo, Lennart Res, RN Outcome: Progressing 02/10/2023 1113 by Mozqueda Jaramillo, Lennart Res, RN Outcome: Progressing   Problem: Nutrition: Goal: Adequate nutrition will be maintained 02/10/2023 1113 by Mozqueda Jaramillo, Lennart Res, RN Outcome: Progressing 02/10/2023 1113 by Mozqueda Jaramillo, Lennart Res, RN Outcome: Progressing   Problem: Coping: Goal: Level of anxiety will decrease 02/10/2023 1113 by Mozqueda Jaramillo, Thedford Bunton Alondra, RN Outcome: Progressing 02/10/2023 1113 by Mozqueda Jaramillo, Lennart Res, RN Outcome: Progressing   Problem: Elimination: Goal: Will not experience complications related to bowel motility 02/10/2023 1113 by Mozqueda Jaramillo, Lucresha Dismuke Alondra, RN Outcome: Progressing 02/10/2023 1113 by Mozqueda Jaramillo, Lennart Res, RN Outcome: Progressing Goal: Will not experience complications related to urinary retention 02/10/2023 1113 by Mozqueda Jaramillo, Lennart Res, RN Outcome: Progressing 02/10/2023 1113 by Mozqueda Jaramillo, Lennart Res, RN Outcome: Progressing   Problem: Pain Management: Goal: General experience of comfort will improve 02/10/2023 1113 by Mozqueda Jaramillo, Lennart Res, RN Outcome: Progressing 02/10/2023 1113 by Mozqueda Jaramillo, Zarai Orsborn Alondra, RN Outcome: Progressing   Problem: Safety: Goal: Ability to remain free from injury will improve 02/10/2023 1113 by Rosamond Mould, Lennart Res, RN Outcome: Progressing 02/10/2023 1113 by Mozqueda Jaramillo, Caressa Scearce Alondra, RN Outcome: Progressing   Problem: Skin Integrity: Goal: Risk for impaired skin integrity will decrease 02/10/2023 1113 by Rosamond Mould Lennart Res, RN Outcome: Progressing 02/10/2023 1113 by Mozqueda Jaramillo, Eri Platten Alondra, RN Outcome: Progressing

## 2023-02-10 NOTE — Progress Notes (Signed)
 PROGRESS NOTE    [name] M Colao Jr.  FMW:[id number] DOB: [birth year] DOA: 02/07/2023 PCP: Shepard Ade, MD    Brief Narrative:  78 year old with history of DM1, diabetic polyneuropathy, CKD 3B, Charcot's arthropathy with recent toe amputation, CAD status post PCI, CVA, chronic pain recently admitted from 12/23 - 12/27 for great left toe osteomyelitis and underwent amputation on 12/23..  Admitted this time for DKA, started on DKA protocol.   Assessment & Plan:  Active Problems:   DKA, type 1 (HCC)   DKA, type 2 (HCC)   Contusion of second toe of left foot   Status post amputation of left great toe (HCC)    Diabetic ketoacidosis History of diabetes mellitus type 1 Diabetic polyneuropathy - DKA protocol, slowly transition to subcu home regimen including sliding scale and Accu-Cheks.  AKI on CKD stage IIIa - Baseline creatinine 1.0, during this admission peaked at 1.58.  Improved with IV fluids.  Status post left great toe amputation History of left foot Charcot arthropathy - Recent amputation on 12/23.  Seen by podiatry during this admission, recommending routine dressing and outpatient follow-up. On Total 7 days of Augmentin  since dc on 12/27  Hyponatremia - This is pseudohyponatremia in setting of hyperglycemia  CAD status post PCI - Continue home including aspirin , Plavix , statin, Lopressor   Essential hypertension -Continue Lopressor .  IV as needed  DVT prophylaxis: enoxaparin  (LOVENOX ) injection 40 mg Start: 02/08/23 1000   Code Status: Limited: Do not attempt resuscitation (DNR) -DNR-LIMITED -Do Not Intubate/DNI  Family Communication:   Status is: Inpatient Remains inpatient appropriate because: SNF placement.     Subjective:  Doing ok no complaints.   Examination:  General exam: Appears calm and comfortable  Respiratory system: Clear to auscultation. Respiratory effort normal. Cardiovascular system: S1 & S2 heard, RRR. No JVD, murmurs, rubs, gallops or  clicks. No pedal edema. Gastrointestinal system: Abdomen is nondistended, soft and nontender. No organomegaly or masses felt. Normal bowel sounds heard. Central nervous system: Alert and oriented. No focal neurological deficits. Extremities: Symmetric 5 x 5 power. Skin: No rashes, lesions or ulcers Psychiatry: Judgement and insight appear normal. Mood & affect appropriate.                Diet Orders (From admission, onward)     Start     Ordered   02/08/23 1403  Diet heart healthy/carb modified Room service appropriate? Yes; Fluid consistency: Thin  Diet effective now       Question Answer Comment  Diet-HS Snack? Nothing   Room service appropriate? Yes   Fluid consistency: Thin      02/08/23 1403            Objective: Vitals:   02/09/23 2142 02/10/23 0538 02/10/23 0803 02/10/23 0804  BP: (!) 168/92 135/71 (!) 146/64 (!) 146/64  Pulse: (!) 104 82 81 81  Resp:  16    Temp:  98.4 F (36.9 C)    TempSrc:  Oral    SpO2:  91%    Weight:      Height:        Intake/Output Summary (Last 24 hours) at 02/10/2023 1227 Last data filed at 02/10/2023 1020 Gross per 24 hour  Intake 292.74 ml  Output 600 ml  Net -307.26 ml   Filed Weights   02/09/23 1419  Weight: 89.1 kg    Scheduled Meds:  amoxicillin -clavulanate  1 tablet Oral Q12H   aspirin   81 mg Oral Daily   atorvastatin   10 mg Oral QHS  clopidogrel   75 mg Oral Daily   DULoxetine   60 mg Oral BID   enoxaparin  (LOVENOX ) injection  40 mg Subcutaneous Q24H   feeding supplement (GLUCERNA SHAKE)  237 mL Oral TID BM   finasteride   5 mg Oral q morning   insulin  aspart  0-15 Units Subcutaneous TID WC   insulin  aspart  0-5 Units Subcutaneous QHS   insulin  aspart  4 Units Subcutaneous TID WC   insulin  glargine-yfgn  10 Units Subcutaneous QHS   insulin  glargine-yfgn  18 Units Subcutaneous Daily   lactobacillus  1 g Oral TID WC   metoprolol  tartrate  12.5 mg Oral BID   polyethylene glycol  17 g Oral BID    senna-docusate  1 tablet Oral BID   tapentadol   200 mg Oral Q12H   traZODone   150 mg Oral QHS   Continuous Infusions:  Nutritional status     Body mass index is 26.64 kg/m.  Data Reviewed:   CBC: Recent Labs  Lab 02/07/23 2336 02/07/23 2355 02/09/23 0654 02/10/23 0621  WBC 10.9*  --  9.3 5.9  NEUTROABS 8.0*  --  5.6 3.1  HGB 13.2 13.9 13.1 12.0*  HCT 40.3 41.0 38.4* 36.3*  MCV 102.5*  --  103.2* 103.7*  PLT 119*  --  116* 103*   Basic Metabolic Panel: Recent Labs  Lab 02/08/23 0600 02/08/23 1114 02/08/23 1400 02/09/23 0654 02/10/23 0621  NA 131* 132* 134* 131* 136  K 4.1 3.6 3.7 4.4 3.9  CL 100 103 104 99 106  CO2 16* 24 25 19* 24  GLUCOSE 353* 136* 127* 360* 169*  BUN 32* 27* 23 22 20   CREATININE 1.52* 1.20 1.06 1.29* 0.95  CALCIUM  7.9* 7.9* 8.1* 8.2* 8.1*  MG 2.1  --   --  2.0 1.9  PHOS 3.6  --   --  3.2 3.2   GFR: Estimated Creatinine Clearance: 73.7 mL/min (by C-G formula based on SCr of 0.95 mg/dL). Liver Function Tests: Recent Labs  Lab 02/07/23 2336 02/09/23 0654 02/10/23 0621  AST 37 31 24  ALT 41 31 24  ALKPHOS 195* 149* 125  BILITOT 2.0* 1.4* 0.8  PROT 7.6 6.4* 5.7*  ALBUMIN  3.1* 2.6* 2.3*   No results for input(s): LIPASE, AMYLASE in the last 168 hours. No results for input(s): AMMONIA in the last 168 hours. Coagulation Profile: No results for input(s): INR, PROTIME in the last 168 hours. Cardiac Enzymes: No results for input(s): CKTOTAL, CKMB, CKMBINDEX, TROPONINI in the last 168 hours. BNP (last 3 results) No results for input(s): PROBNP in the last 8760 hours. HbA1C: Recent Labs    02/09/23 0654  HGBA1C 6.5*   CBG: Recent Labs  Lab 02/09/23 1618 02/09/23 2140 02/10/23 0747 02/10/23 1111 02/10/23 1154  GLUCAP 145* 184* 159* 53* 64*   Lipid Profile: No results for input(s): CHOL, HDL, LDLCALC, TRIG, CHOLHDL, LDLDIRECT in the last 72 hours. Thyroid  Function Tests: No results for  input(s): TSH, T4TOTAL, FREET4, T3FREE, THYROIDAB in the last 72 hours. Anemia Panel: No results for input(s): VITAMINB12, FOLATE, FERRITIN, TIBC, IRON, RETICCTPCT in the last 72 hours. Sepsis Labs: No results for input(s): PROCALCITON, LATICACIDVEN in the last 168 hours.  No results found for this or any previous visit (from the past 240 hours).       Radiology Studies: No results found.         LOS: 2 days   Time spent= 35 mins    Burgess JAYSON Dare, MD [facility]  If 7PM-7AM, please contact night-coverage  02/10/2023, 12:27 PM

## 2023-02-11 DIAGNOSIS — E1111 Type 2 diabetes mellitus with ketoacidosis with coma: Secondary | ICD-10-CM

## 2023-02-11 LAB — CBC
HCT: 36.4 % — ABNORMAL LOW (ref 39.0–52.0)
Hemoglobin: 11.9 g/dL — ABNORMAL LOW (ref 13.0–17.0)
MCH: 34.3 pg — ABNORMAL HIGH (ref 26.0–34.0)
MCHC: 32.7 g/dL (ref 30.0–36.0)
MCV: 104.9 fL — ABNORMAL HIGH (ref 80.0–100.0)
Platelets: 81 10*3/uL — ABNORMAL LOW (ref 150–400)
RBC: 3.47 MIL/uL — ABNORMAL LOW (ref 4.22–5.81)
RDW: 14 % (ref 11.5–15.5)
WBC: 5.3 10*3/uL (ref 4.0–10.5)
nRBC: 0 % (ref 0.0–0.2)

## 2023-02-11 LAB — GLUCOSE, CAPILLARY
Glucose-Capillary: 111 mg/dL — ABNORMAL HIGH (ref 70–99)
Glucose-Capillary: 244 mg/dL — ABNORMAL HIGH (ref 70–99)
Glucose-Capillary: 44 mg/dL — CL (ref 70–99)
Glucose-Capillary: 55 mg/dL — ABNORMAL LOW (ref 70–99)
Glucose-Capillary: 88 mg/dL (ref 70–99)
Glucose-Capillary: 348 mg/dL — ABNORMAL HIGH (ref 70–99)
Glucose-Capillary: 330 mg/dL — ABNORMAL HIGH (ref 70–99)

## 2023-02-11 LAB — BASIC METABOLIC PANEL
Anion gap: 6 (ref 5–15)
BUN: 20 mg/dL (ref 8–23)
CO2: 27 mmol/L (ref 22–32)
Calcium: 8.2 mg/dL — ABNORMAL LOW (ref 8.9–10.3)
Chloride: 105 mmol/L (ref 98–111)
Creatinine, Ser: 0.97 mg/dL (ref 0.61–1.24)
GFR, Estimated: >60 mL/min (ref >60)
Glucose, Bld: 120 mg/dL — ABNORMAL HIGH (ref 70–99)
Potassium: 4 mmol/L (ref 3.5–5.1)
Sodium: 138 mmol/L (ref 135–145)

## 2023-02-11 LAB — MAGNESIUM: Magnesium: 2 mg/dL (ref 1.7–2.4)

## 2023-02-11 LAB — PHOSPHORUS: Phosphorus: 3.5 mg/dL (ref 2.5–4.6)

## 2023-02-11 MED ORDER — INSULIN GLARGINE-YFGN 100 UNIT/ML ~~LOC~~ SOLN
10.0000 [IU] | Freq: Two times a day (BID) | SUBCUTANEOUS | Status: DC
  Filled 2023-02-11: qty 0.1

## 2023-02-11 MED ORDER — GLUCAGON HCL RDNA (DIAGNOSTIC) 1 MG IJ SOLR
1.0000 mg | INTRAMUSCULAR | Status: DC | PRN
  Administered 2023-02-11: 1 mg via INTRAVENOUS
  Filled 2023-02-11: qty 1

## 2023-02-11 MED ORDER — INSULIN GLARGINE-YFGN 100 UNIT/ML ~~LOC~~ SOLN
8.0000 [IU] | Freq: Every day | SUBCUTANEOUS | Status: DC
  Filled 2023-02-11: qty 0.08

## 2023-02-11 MED ORDER — INSULIN GLARGINE-YFGN 100 UNIT/ML ~~LOC~~ SOLN
10.0000 [IU] | Freq: Every day | SUBCUTANEOUS | Status: DC
  Administered 2023-02-11: 10 [IU] via SUBCUTANEOUS
  Filled 2023-02-11: qty 0.1

## 2023-02-11 NOTE — Progress Notes (Addendum)
 Hypoglycemic Event  CBG: 44  Treatment: 8 oz juice/soda, Glucerna, OJ  Symptoms: Shaky  Follow-up CBG: Time:1650 CBG Result: 55  Possible Reasons for Event: Inadequate meal intake  Follow-up CBG: Time:1650 CBG Result: 55  1700-Glucagen  administered  Follow-up CBG: Time:1715 CBG Result: 88 Comments/MD notified: MD Amin stopped both long acting and scheduled premeal insulin    [name]  [name]

## 2023-02-11 NOTE — Progress Notes (Signed)
 PROGRESS NOTE    [name] M Kubisiak Jr.  FMW:[id number] DOB: 10-Dec-1945 DOA: 02/07/2023 PCP: Shepard Ade, MD    Brief Narrative:  78 year old with history of DM1, diabetic polyneuropathy, CKD 3B, Charcot's arthropathy with recent toe amputation, CAD status post PCI, CVA, chronic pain recently admitted from 12/23 - 12/27 for great left toe osteomyelitis and underwent amputation on 12/23..  Admitted this time for DKA, started on DKA protocol.  Eventually blood glucose stabilized.  PT/OT recommended SNF.   Assessment & Plan:  Active Problems:   DKA, type 1 (HCC)   DKA, type 2 (HCC)   Contusion of second toe of left foot   Status post amputation of left great toe (HCC)    Diabetic ketoacidosis History of diabetes mellitus type 1 Diabetic polyneuropathy - DKA protocol, slowly transition to subcu home regimen including sliding scale and Accu-Cheks.  Often getting slightly hypoglycemic, will change insulin  to 10 units twice daily.  AKI on CKD stage IIIa - Baseline creatinine 1.0, during this admission peaked at 1.58.  Improved with IV fluids.  Status post left great toe amputation History of left foot Charcot arthropathy - Recent amputation on 12/23.  Seen by podiatry during this admission, recommending routine dressing and outpatient follow-up. On Total 7 days of Augmentin  since dc on 12/27  Hyponatremia - This is pseudohyponatremia in setting of hyperglycemia  CAD status post PCI - Continue home including aspirin , Plavix , statin, Lopressor   Essential hypertension -Continue Lopressor .  IV as needed  DVT prophylaxis: enoxaparin  (LOVENOX ) injection 40 mg Start: 02/08/23 1000   Code Status: Limited: Do not attempt resuscitation (DNR) -DNR-LIMITED -Do Not Intubate/DNI  Family Communication:   Status is: Inpatient Remains inpatient appropriate because: SNF placement.     Subjective:  No complaints.  Ate all of his breakfast this morning.  Slightly low blood  glucose  Examination:  General exam: Appears calm and comfortable  Respiratory system: Clear to auscultation. Respiratory effort normal. Cardiovascular system: S1 & S2 heard, RRR. No JVD, murmurs, rubs, gallops or clicks. No pedal edema. Gastrointestinal system: Abdomen is nondistended, soft and nontender. No organomegaly or masses felt. Normal bowel sounds heard. Central nervous system: Alert and oriented. No focal neurological deficits. Extremities: Symmetric 5 x 5 power. Skin: No rashes, lesions or ulcers Psychiatry: Judgement and insight appear normal. Mood & affect appropriate.                Diet Orders (From admission, onward)     Start     Ordered   02/08/23 1403  Diet heart healthy/carb modified Room service appropriate? Yes; Fluid consistency: Thin  Diet effective now       Question Answer Comment  Diet-HS Snack? Nothing   Room service appropriate? Yes   Fluid consistency: Thin      02/08/23 1403            Objective: Vitals:   02/10/23 1348 02/10/23 2002 02/10/23 2143 02/11/23 0645  BP: 136/66 (!) 164/70 (!) 164/70 130/63  Pulse: 93 94 94 75  Resp: 16 20  20   Temp: 97.6 F (36.4 C) 98.4 F (36.9 C)  98.1 F (36.7 C)  TempSrc: Oral Oral  Oral  SpO2: 91% 97%  91%  Weight:      Height:        Intake/Output Summary (Last 24 hours) at 02/11/2023 1055 Last data filed at 02/11/2023 0711 Gross per 24 hour  Intake 300 ml  Output 1525 ml  Net -1225 ml   [employer]  02/09/23 1419  Weight: 89.1 kg    Scheduled Meds:  aspirin   81 mg Oral Daily   atorvastatin   10 mg Oral QHS   clopidogrel   75 mg Oral Daily   DULoxetine   60 mg Oral BID   enoxaparin  (LOVENOX ) injection  40 mg Subcutaneous Q24H   feeding supplement (GLUCERNA SHAKE)  237 mL Oral TID BM   finasteride   5 mg Oral q morning   insulin  aspart  0-15 Units Subcutaneous TID WC   insulin  aspart  0-5 Units Subcutaneous QHS   insulin  aspart  4 Units Subcutaneous TID WC   insulin   glargine-yfgn  10 Units Subcutaneous BID   lactobacillus  1 g Oral TID WC   metoprolol  tartrate  12.5 mg Oral BID   polyethylene glycol  17 g Oral BID   senna-docusate  1 tablet Oral BID   tapentadol   200 mg Oral Q12H   traZODone   150 mg Oral QHS   Continuous Infusions:  Nutritional status     Body mass index is 26.64 kg/m.  Data Reviewed:   CBC: Recent Labs  Lab 02/07/23 2336 02/07/23 2355 02/09/23 0654 02/10/23 0621 02/11/23 0752  WBC 10.9*  --  9.3 5.9 5.3  NEUTROABS 8.0*  --  5.6 3.1  --   HGB 13.2 13.9 13.1 12.0* 11.9*  HCT 40.3 41.0 38.4* 36.3* 36.4*  MCV 102.5*  --  103.2* 103.7* 104.9*  PLT 119*  --  116* 103* 81*   Basic Metabolic Panel: Recent Labs  Lab 02/08/23 0600 02/08/23 1114 02/08/23 1400 02/09/23 0654 02/10/23 0621 02/11/23 0752  NA 131* 132* 134* 131* 136 138  K 4.1 3.6 3.7 4.4 3.9 4.0  CL 100 103 104 99 106 105  CO2 16* 24 25 19* 24 27  GLUCOSE 353* 136* 127* 360* 169* 120*  BUN 32* 27* 23 22 20 20   CREATININE 1.52* 1.20 1.06 1.29* 0.95 0.97  CALCIUM  7.9* 7.9* 8.1* 8.2* 8.1* 8.2*  MG 2.1  --   --  2.0 1.9 2.0  PHOS 3.6  --   --  3.2 3.2 3.5   GFR: Estimated Creatinine Clearance: 72.2 mL/min (by C-G formula based on SCr of 0.97 mg/dL). Liver Function Tests: Recent Labs  Lab 02/07/23 2336 02/09/23 0654 02/10/23 0621  AST 37 31 24  ALT 41 31 24  ALKPHOS 195* 149* 125  BILITOT 2.0* 1.4* 0.8  PROT 7.6 6.4* 5.7*  ALBUMIN  3.1* 2.6* 2.3*   No results for input(s): LIPASE, AMYLASE in the last 168 hours. No results for input(s): AMMONIA in the last 168 hours. Coagulation Profile: No results for input(s): INR, PROTIME in the last 168 hours. Cardiac Enzymes: No results for input(s): CKTOTAL, CKMB, CKMBINDEX, TROPONINI in the last 168 hours. BNP (last 3 results) No results for input(s): PROBNP in the last 8760 hours. HbA1C: Recent Labs    02/09/23 0654  HGBA1C 6.5*   CBG: Recent Labs  Lab 02/10/23 1111  02/10/23 1154 02/10/23 1626 02/10/23 2000 02/11/23 0724  GLUCAP 53* 64* 207* 189* 111*   Lipid Profile: No results for input(s): CHOL, HDL, LDLCALC, TRIG, CHOLHDL, LDLDIRECT in the last 72 hours. Thyroid  Function Tests: No results for input(s): TSH, T4TOTAL, FREET4, T3FREE, THYROIDAB in the last 72 hours. Anemia Panel: No results for input(s): VITAMINB12, FOLATE, FERRITIN, TIBC, IRON, RETICCTPCT in the last 72 hours. Sepsis Labs: No results for input(s): PROCALCITON, LATICACIDVEN in the last 168 hours.  No results found for this or any previous visit (from the  past 240 hours).       Radiology Studies: No results found.         LOS: 3 days   Time spent= 35 mins    Burgess JAYSON Dare, MD [facility]  If 7PM-7AM, please contact night-coverage  02/11/2023, 10:55 AM

## 2023-02-11 NOTE — Plan of Care (Signed)

## 2023-02-12 DIAGNOSIS — E1110 Type 2 diabetes mellitus with ketoacidosis without coma: Secondary | ICD-10-CM | POA: Diagnosis not present

## 2023-02-12 LAB — CBC
HCT: 35.4 % — ABNORMAL LOW (ref 39.0–52.0)
Hemoglobin: 11.8 g/dL — ABNORMAL LOW (ref 13.0–17.0)
MCH: 34.7 pg — ABNORMAL HIGH (ref 26.0–34.0)
MCHC: 33.3 g/dL (ref 30.0–36.0)
MCV: 104.1 fL — ABNORMAL HIGH (ref 80.0–100.0)
Platelets: 82 K/uL — ABNORMAL LOW (ref 150–400)
RBC: 3.4 MIL/uL — ABNORMAL LOW (ref 4.22–5.81)
RDW: 13.9 % (ref 11.5–15.5)
WBC: 5.9 K/uL (ref 4.0–10.5)
nRBC: 0 % (ref 0.0–0.2)

## 2023-02-12 LAB — BASIC METABOLIC PANEL
Anion gap: 5 (ref 5–15)
BUN: 21 mg/dL (ref 8–23)
CO2: 29 mmol/L (ref 22–32)
Calcium: 8.3 mg/dL — ABNORMAL LOW (ref 8.9–10.3)
Chloride: 101 mmol/L (ref 98–111)
Creatinine, Ser: 1.1 mg/dL (ref 0.61–1.24)
GFR, Estimated: >60 mL/min (ref >60)
Glucose, Bld: 145 mg/dL — ABNORMAL HIGH (ref 70–99)
Potassium: 4.3 mmol/L (ref 3.5–5.1)
Sodium: 135 mmol/L (ref 135–145)

## 2023-02-12 LAB — GLUCOSE, CAPILLARY
Glucose-Capillary: 138 mg/dL — ABNORMAL HIGH (ref 70–99)
Glucose-Capillary: 216 mg/dL — ABNORMAL HIGH (ref 70–99)
Glucose-Capillary: 141 mg/dL — ABNORMAL HIGH (ref 70–99)
Glucose-Capillary: 171 mg/dL — ABNORMAL HIGH (ref 70–99)
Glucose-Capillary: 204 mg/dL — ABNORMAL HIGH (ref 70–99)

## 2023-02-12 LAB — MAGNESIUM: Magnesium: 2.1 mg/dL (ref 1.7–2.4)

## 2023-02-12 MED ORDER — ORAL CARE MOUTH RINSE: 15.0000 mL | OROMUCOSAL | Status: DC | PRN

## 2023-02-12 MED ORDER — INSULIN GLARGINE-YFGN 100 UNIT/ML ~~LOC~~ SOLN
15.0000 [IU] | Freq: Every day | SUBCUTANEOUS | Status: DC
  Administered 2023-02-12: 15 [IU] via SUBCUTANEOUS
  Filled 2023-02-12 (×2): qty 0.15

## 2023-02-12 NOTE — Progress Notes (Signed)
 PROGRESS NOTE    [name] M Buelna Jr.  FMW:[id number] DOB: [birth year] DOA: 02/07/2023 PCP: Shepard Ade, MD    Brief Narrative:  78 year old with history of DM1, diabetic polyneuropathy, CKD 3B, Charcot's arthropathy with recent toe amputation, CAD status post PCI, CVA, chronic pain recently admitted from 12/23 - 12/27 for great left toe osteomyelitis and underwent amputation on 12/23..  Admitted this time for DKA, started on DKA protocol.  Eventually blood glucose stabilized.  PT/OT recommended SNF.   Assessment & Plan:  Active Problems:   DKA, type 1 (HCC)   DKA, type 2 (HCC)   Contusion of second toe of left foot   Status post amputation of left great toe (HCC)    Diabetic ketoacidosis History of diabetes mellitus type 1 Diabetic polyneuropathy - DKA protocol, slowly transition to subcu home regimen including sliding scale and Accu-Cheks.  Labile blood glucose.  Will transition to daily Semglee , diabetic coordinator consulted  AKI on CKD stage IIIa, resolved - Baseline creatinine 1.0, during this admission peaked at 1.58.  Improved with IV fluids.  Status post left great toe amputation History of left foot Charcot arthropathy - Recent amputation on 12/23.  Seen by podiatry during this admission, recommending routine dressing and outpatient follow-up. On Total 7 days of Augmentin  since dc on 12/27  Hyponatremia - This is pseudohyponatremia in setting of hyperglycemia  CAD status post PCI - Continue home including aspirin , Plavix , statin, Lopressor   Essential hypertension -Continue Lopressor .  IV as needed  PT/OT-SNF  DVT prophylaxis: enoxaparin  (LOVENOX ) injection 40 mg Start: 02/08/23 1000   Code Status: Limited: Do not attempt resuscitation (DNR) -DNR-LIMITED -Do Not Intubate/DNI  Family Communication:   Status is: Inpatient Remains inpatient appropriate because: SNF placement.     Subjective: BS have been labile.  Ate his breakfast in full    Examination:  General exam: Appears calm and comfortable  Respiratory system: Clear to auscultation. Respiratory effort normal. Cardiovascular system: S1 & S2 heard, RRR. No JVD, murmurs, rubs, gallops or clicks. No pedal edema. Gastrointestinal system: Abdomen is nondistended, soft and nontender. No organomegaly or masses felt. Normal bowel sounds heard. Central nervous system: Alert and oriented. No focal neurological deficits. Extremities: Symmetric 5 x 5 power. Skin: No rashes, lesions or ulcers Psychiatry: Judgement and insight appear normal. Mood & affect appropriate.                Diet Orders (From admission, onward)     Start     Ordered   02/08/23 1403  Diet heart healthy/carb modified Room service appropriate? Yes; Fluid consistency: Thin  Diet effective now       Question Answer Comment  Diet-HS Snack? Nothing   Room service appropriate? Yes   Fluid consistency: Thin      02/08/23 1403            Objective: Vitals:   02/11/23 0645 02/11/23 1349 02/11/23 2142 02/12/23 0539  BP: 130/63 (!) 152/78 (!) 145/87 137/74  Pulse: 75 88 87 74  Resp: 20 16 20 20   Temp: 98.1 F (36.7 C) 98.6 F (37 C) 98 F (36.7 C) 98.1 F (36.7 C)  TempSrc: Oral Oral Oral Oral  SpO2: 91% 97% 94% 90%  Weight:      Height:        Intake/Output Summary (Last 24 hours) at 02/12/2023 1104 Last data filed at 02/12/2023 0932 Gross per 24 hour  Intake --  Output 1600 ml  Net -1600 ml   [employer]  02/09/23 1419  Weight: 89.1 kg    Scheduled Meds:  aspirin   81 mg Oral Daily   atorvastatin   10 mg Oral QHS   clopidogrel   75 mg Oral Daily   DULoxetine   60 mg Oral BID   enoxaparin  (LOVENOX ) injection  40 mg Subcutaneous Q24H   feeding supplement (GLUCERNA SHAKE)  237 mL Oral TID BM   finasteride   5 mg Oral q morning   insulin  aspart  0-15 Units Subcutaneous TID WC   insulin  aspart  0-5 Units Subcutaneous QHS   insulin  glargine-yfgn  15 Units Subcutaneous Daily    lactobacillus  1 g Oral TID WC   metoprolol  tartrate  12.5 mg Oral BID   polyethylene glycol  17 g Oral BID   senna-docusate  1 tablet Oral BID   tapentadol   200 mg Oral Q12H   traZODone   150 mg Oral QHS   Continuous Infusions:  Nutritional status     Body mass index is 26.64 kg/m.  Data Reviewed:   CBC: Recent Labs  Lab 02/07/23 2336 02/07/23 2355 02/09/23 0654 02/10/23 9378 02/11/23 0752 02/12/23 0705  WBC 10.9*  --  9.3 5.9 5.3 5.9  NEUTROABS 8.0*  --  5.6 3.1  --   --   HGB 13.2 13.9 13.1 12.0* 11.9* 11.8*  HCT 40.3 41.0 38.4* 36.3* 36.4* 35.4*  MCV 102.5*  --  103.2* 103.7* 104.9* 104.1*  PLT 119*  --  116* 103* 81* 82*   Basic Metabolic Panel: Recent Labs  Lab 02/08/23 0600 02/08/23 1114 02/08/23 1400 02/09/23 0654 02/10/23 0621 02/11/23 0752 02/12/23 0705  NA 131*   < > 134* 131* 136 138 135  K 4.1   < > 3.7 4.4 3.9 4.0 4.3  CL 100   < > 104 99 106 105 101  CO2 16*   < > 25 19* 24 27 29   GLUCOSE 353*   < > 127* 360* 169* 120* 145*  BUN 32*   < > 23 22 20 20 21   CREATININE 1.52*   < > 1.06 1.29* 0.95 0.97 1.10  CALCIUM  7.9*   < > 8.1* 8.2* 8.1* 8.2* 8.3*  MG 2.1  --   --  2.0 1.9 2.0 2.1  PHOS 3.6  --   --  3.2 3.2 3.5  --    < > = values in this interval not displayed.   GFR: Estimated Creatinine Clearance: 63.7 mL/min (by C-G formula based on SCr of 1.1 mg/dL). Liver Function Tests: Recent Labs  Lab 02/07/23 2336 02/09/23 0654 02/10/23 0621  AST 37 31 24  ALT 41 31 24  ALKPHOS 195* 149* 125  BILITOT 2.0* 1.4* 0.8  PROT 7.6 6.4* 5.7*  ALBUMIN  3.1* 2.6* 2.3*   No results for input(s): LIPASE, AMYLASE in the last 168 hours. No results for input(s): AMMONIA in the last 168 hours. Coagulation Profile: No results for input(s): INR, PROTIME in the last 168 hours. Cardiac Enzymes: No results for input(s): CKTOTAL, CKMB, CKMBINDEX, TROPONINI in the last 168 hours. BNP (last 3 results) No results for input(s): PROBNP in  the last 8760 hours. HbA1C: No results for input(s): HGBA1C in the last 72 hours. CBG: Recent Labs  Lab 02/11/23 1652 02/11/23 1710 02/11/23 2142 02/11/23 2252 02/12/23 0736  GLUCAP 55* 88 348* 330* 138*   Lipid Profile: No results for input(s): CHOL, HDL, LDLCALC, TRIG, CHOLHDL, LDLDIRECT in the last 72 hours. Thyroid  Function Tests: No results for input(s): TSH, T4TOTAL, FREET4, T3FREE, THYROIDAB  in the last 72 hours. Anemia Panel: No results for input(s): VITAMINB12, FOLATE, FERRITIN, TIBC, IRON, RETICCTPCT in the last 72 hours. Sepsis Labs: No results for input(s): PROCALCITON, LATICACIDVEN in the last 168 hours.  No results found for this or any previous visit (from the past 240 hours).       Radiology Studies: No results found.         LOS: 4 days   Time spent= 35 mins    Burgess JAYSON Dare, MD [facility]  If 7PM-7AM, please contact night-coverage  02/12/2023, 11:04 AM

## 2023-02-12 NOTE — Inpatient Diabetes Management (Addendum)
 Inpatient Diabetes Program Recommendations  AACE/ADA: New Consensus Statement on Inpatient Glycemic Control (2015)  Target Ranges:  Prepandial:   less than 140 mg/dL      Peak postprandial:   less than 180 mg/dL (1-2 hours)      Critically ill patients:  140 - 180 mg/dL    Latest Reference Range & Units 10/11/22 03:22 02/09/23 06:54  Hemoglobin A1C 4.8 - 5.6 % 5.8 (H) 6.5 (H)  (H): Data is abnormally high  Latest Reference Range & Units 02/11/23 07:24 02/11/23 11:27 02/11/23 16:24 02/11/23 16:52 02/11/23 17:10 02/11/23 21:42 02/11/23 22:52  Glucose-Capillary 70 - 99 mg/dL 888 (H)  4 units Novolog   244 (H)  9 units Novolog  @1255   10 units Semglee  @1016  44 (LL) 55 (L) 88 348 (H) 330 (H)  4 units Novolog      Latest Reference Range & Units 02/12/23 07:36  Glucose-Capillary 70 - 99 mg/dL 861 (H)  2 units Novolog   15 units Semglee   (H): Data is abnormally hi     History: Type 1 diabetes  Home DM Meds: Semglee  30 units QPM      Novolog  4-16 TID per SSI (MD notes state Novolog  per SSI but Home Med Rec states Humulin  R [id number] Kwikpen)  Current Orders: Semglee  15 units daily      Novolog  Moderate Correction Scale/ SSI (0-15 units) TID AC + HS     Discharged to [facility] 02/03/2023 with the following insulin  regimen: Semglee  30 units at bedtime Novolog  0-9 units TID with meals + Bedtime Correction Stop taking Humulin  N (NPH)    MD- Note Hypoglycemia yesterday afternoon followed by hyperglycemia at bedtime (got too much Novolog  at 12pm and then high at bedtime b/c pt didn't get any food coverage at dinner)  Note Semglee  adjusted to 15 units Daily--Agree  Recommend the following:  1. Reduce the Novolog  SSI to the Sensitive 0-9 unit Scale  2. Start back low dose Novolog  Meal Coverage: Novolog  3 units TID with meals to start HOLD if pt NPO HOLD if pt eats <50% meals     --Will follow patient during hospitalization--  Adina Rudolpho Arrow RN, MSN, CDCES Diabetes  Coordinator Inpatient Glycemic Control Team Team Pager: [phone] (8a-5p)

## 2023-02-12 NOTE — Plan of Care (Signed)

## 2023-02-13 ENCOUNTER — Encounter: Payer: BLUE CROSS/BLUE SHIELD | Admitting: Podiatry

## 2023-02-13 DIAGNOSIS — I2510 Atherosclerotic heart disease of native coronary artery without angina pectoris: Secondary | ICD-10-CM | POA: Diagnosis not present

## 2023-02-13 DIAGNOSIS — R471 Dysarthria and anarthria: Secondary | ICD-10-CM | POA: Diagnosis not present

## 2023-02-13 DIAGNOSIS — L97518 Non-pressure chronic ulcer of other part of right foot with other specified severity: Secondary | ICD-10-CM | POA: Diagnosis not present

## 2023-02-13 DIAGNOSIS — E785 Hyperlipidemia, unspecified: Secondary | ICD-10-CM | POA: Diagnosis not present

## 2023-02-13 DIAGNOSIS — E1040 Type 1 diabetes mellitus with diabetic neuropathy, unspecified: Secondary | ICD-10-CM | POA: Diagnosis not present

## 2023-02-13 DIAGNOSIS — F339 Major depressive disorder, recurrent, unspecified: Secondary | ICD-10-CM | POA: Diagnosis not present

## 2023-02-13 DIAGNOSIS — R1311 Dysphagia, oral phase: Secondary | ICD-10-CM | POA: Diagnosis not present

## 2023-02-13 DIAGNOSIS — I10 Essential (primary) hypertension: Secondary | ICD-10-CM | POA: Diagnosis not present

## 2023-02-13 DIAGNOSIS — N189 Chronic kidney disease, unspecified: Secondary | ICD-10-CM | POA: Diagnosis not present

## 2023-02-13 DIAGNOSIS — E11621 Type 2 diabetes mellitus with foot ulcer: Secondary | ICD-10-CM | POA: Diagnosis not present

## 2023-02-13 DIAGNOSIS — Z89412 Acquired absence of left great toe: Secondary | ICD-10-CM | POA: Diagnosis not present

## 2023-02-13 DIAGNOSIS — R278 Other lack of coordination: Secondary | ICD-10-CM | POA: Diagnosis not present

## 2023-02-13 DIAGNOSIS — Z7401 Bed confinement status: Secondary | ICD-10-CM | POA: Diagnosis not present

## 2023-02-13 DIAGNOSIS — R0902 Hypoxemia: Secondary | ICD-10-CM | POA: Diagnosis not present

## 2023-02-13 DIAGNOSIS — E1110 Type 2 diabetes mellitus with ketoacidosis without coma: Secondary | ICD-10-CM | POA: Diagnosis not present

## 2023-02-13 DIAGNOSIS — R2689 Other abnormalities of gait and mobility: Secondary | ICD-10-CM | POA: Diagnosis not present

## 2023-02-13 DIAGNOSIS — R2681 Unsteadiness on feet: Secondary | ICD-10-CM | POA: Diagnosis not present

## 2023-02-13 DIAGNOSIS — M6281 Muscle weakness (generalized): Secondary | ICD-10-CM | POA: Diagnosis not present

## 2023-02-13 DIAGNOSIS — N400 Enlarged prostate without lower urinary tract symptoms: Secondary | ICD-10-CM | POA: Diagnosis not present

## 2023-02-13 LAB — GLUCOSE, CAPILLARY
Glucose-Capillary: 413 mg/dL — ABNORMAL HIGH (ref 70–99)
Glucose-Capillary: 421 mg/dL — ABNORMAL HIGH (ref 70–99)
Glucose-Capillary: 356 mg/dL — ABNORMAL HIGH (ref 70–99)

## 2023-02-13 LAB — MAGNESIUM: Magnesium: 2.1 mg/dL (ref 1.7–2.4)

## 2023-02-13 LAB — CBC
HCT: 38.5 % — ABNORMAL LOW (ref 39.0–52.0)
Hemoglobin: 12.4 g/dL — ABNORMAL LOW (ref 13.0–17.0)
MCH: 33.8 pg (ref 26.0–34.0)
MCHC: 32.2 g/dL (ref 30.0–36.0)
MCV: 104.9 fL — ABNORMAL HIGH (ref 80.0–100.0)
Platelets: 90 10*3/uL — ABNORMAL LOW (ref 150–400)
RBC: 3.67 MIL/uL — ABNORMAL LOW (ref 4.22–5.81)
RDW: 13.9 % (ref 11.5–15.5)
WBC: 4.8 10*3/uL (ref 4.0–10.5)
nRBC: 0 % (ref 0.0–0.2)

## 2023-02-13 LAB — BASIC METABOLIC PANEL
Anion gap: 7 (ref 5–15)
BUN: 26 mg/dL — ABNORMAL HIGH (ref 8–23)
CO2: 28 mmol/L (ref 22–32)
Calcium: 8.3 mg/dL — ABNORMAL LOW (ref 8.9–10.3)
Chloride: 98 mmol/L (ref 98–111)
Creatinine, Ser: 1.34 mg/dL — ABNORMAL HIGH (ref 0.61–1.24)
GFR, Estimated: 55 mL/min — ABNORMAL LOW (ref >60)
Glucose, Bld: 386 mg/dL — ABNORMAL HIGH (ref 70–99)
Potassium: 5.2 mmol/L — ABNORMAL HIGH (ref 3.5–5.1)
Sodium: 133 mmol/L — ABNORMAL LOW (ref 135–145)

## 2023-02-13 MED ORDER — INSULIN ASPART 100 UNIT/ML IJ SOLN
3.0000 [IU] | Freq: Three times a day (TID) | INTRAMUSCULAR | Status: DC
  Administered 2023-02-13: 3 [IU] via SUBCUTANEOUS

## 2023-02-13 MED ORDER — SODIUM ZIRCONIUM CYCLOSILICATE 10 G PO PACK
10.0000 g | PACK | Freq: Once | ORAL | Status: DC
  Filled 2023-02-13: qty 1

## 2023-02-13 MED ORDER — INSULIN ASPART 100 UNIT/ML IJ SOLN
12.0000 [IU] | Freq: Once | INTRAMUSCULAR | Status: AC
  Administered 2023-02-13: 12 [IU] via SUBCUTANEOUS

## 2023-02-13 MED ORDER — INSULIN GLARGINE-YFGN 100 UNIT/ML ~~LOC~~ SOLN
18.0000 [IU] | Freq: Every day | SUBCUTANEOUS | Status: DC
  Administered 2023-02-13: 18 [IU] via SUBCUTANEOUS
  Filled 2023-02-13: qty 0.18

## 2023-02-13 MED ORDER — INSULIN ASPART 100 UNIT/ML IJ SOLN
15.0000 [IU] | Freq: Once | INTRAMUSCULAR | Status: AC
  Administered 2023-02-13: 15 [IU] via SUBCUTANEOUS

## 2023-02-13 MED ORDER — INSULIN ASPART 100 UNIT/ML IJ SOLN: 0.0000 [IU] | Freq: Three times a day (TID) | INTRAMUSCULAR | Status: DC

## 2023-02-13 MED ORDER — INSULIN GLARGINE 100 UNIT/ML ~~LOC~~ SOLN: 25.0000 [IU] | Freq: Every day | SUBCUTANEOUS | Status: DC

## 2023-02-13 NOTE — TOC Transition Note (Addendum)
 Transition of Care [facility]) - Discharge Note   Patient Details  Name: [name] Ruddy Nunnelley Jr. MRN: [id number] Date of Birth: 1945-08-29  Transition [facility] CM/SW Contact:  Toy LITTIE Agar, RN Phone [phone]  02/13/2023, 11:39 AM   Clinical Narrative:    Patient to return to [facility]. Discharge summary and updated FL2 have been faxed. Please call report to [phone] Room 404 B.   1142 CM has received message that nursing will need to follow up on CBG so d/c will need to be held until after follow up CBG.   Transportation call is on hold until CM gets clear from MD or nursing.   1337 CM has received message from MD stating that patient is ok to d/c back to [facility]. Transportation has been arranged via [facility]. Estimated wait time is 1-1.5 hours.  Discharge packet is at nurses station.    Final next level of care: Skilled Nursing Facility Barriers to Discharge: No Barriers Identified   Patient Goals and CMS Choice Patient states their goals for this hospitalization and ongoing recovery are:: to return to SNF for rehab CMS Medicare.gov Compare Post Acute Care list provided to::  (n/a wants to go back to facility they came from) Choice offered to / list presented to : NA [place] ownership interest in [facility].provided to::  (n/a)    Discharge Placement                       Discharge Plan and Services Additional resources added to the After Visit Summary for   In-house Referral: NA Discharge Planning Services: CM Consult Post Acute Care Choice: NA          DME Arranged: N/A DME Agency: NA       HH Arranged:  (n/a) HH Agency: NA        Social Drivers of Health (SDOH) Interventions SDOH Screenings   Food Insecurity: No Food Insecurity (02/09/2023)  Housing: Low Risk  (02/09/2023)  Transportation Needs: No Transportation Needs (02/09/2023)  Utilities: Not At Risk (02/09/2023)  Social Connections: Moderately Integrated (02/09/2023)  Tobacco Use:  Medium Risk (02/07/2023)     Readmission Risk Interventions    02/13/2023   10:26 AM  Readmission Risk Prevention Plan  Transportation Screening Complete  PCP or Specialist Appt within 3-5 Days Complete  Social Work Consult for Recovery Care Planning/Counseling Complete  Palliative Care Screening Not Applicable  Medication Review Oceanographer) Referral to Pharmacy

## 2023-02-13 NOTE — [place] FL2 (Signed)
 [place]  MEDICAID [id number] LEVEL OF CARE FORM     IDENTIFICATION  Patient Name: [name] Amon Alkhatib Jr. Birthdate: [birth year] Sex: male Admission Date (Current Location): 02/07/2023  [facility] [place] Number:  Producer, Television/film/video and Address:  [facility],  [address], [place] [address]      Provider Number: [id number]  Attending Physician Name and Address:  Caleen Burgess BROCKS, MD  Relative Name and Phone Number:  Devantae Babe Spouse [phone]    Current Level of Care: Hospital Recommended Level of Care: Skilled Nursing Facility Prior Approval Number:    Date Approved/Denied:   PASRR Number: [id number]  Discharge Plan: SNF    Current Diagnoses: Patient Active Problem List   Diagnosis Date Noted   Contusion of second toe of left foot 02/09/2023   Status post amputation of left great toe (HCC) 02/09/2023   DKA, type 1 (HCC) 02/08/2023   DKA, type 2 (HCC) 02/08/2023   Osteomyelitis of great toe of left foot (HCC) 01/30/2023   Chronic kidney disease, stage 3a (HCC) 10/10/2022   Diabetic ulcer of toe (HCC) 10/10/2022   Thrombocytopenia (HCC) 10/10/2022   Local infection of skin and subcutaneous tissue 09/21/2022   Chronic pain syndrome 04/01/2017   Chronic kidney disease, stage II (mild) 03/31/2017   Coronary artery disease involving native coronary artery 03/31/2017   Depression 03/31/2017   Fecal impaction (HCC) 03/31/2017   History of CVA (cerebrovascular accident) 03/31/2017   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/05/2015   Other fatigue 10/05/2015   Dyspnea 10/05/2015   Late effects of cerebrovascular accident 04/20/2013   Multiple sclerosis (HCC) 06/04/2012   Cerebral artery occlusion with cerebral infarction (HCC) 03/31/2011   Abnormality of gait 03/31/2011   Diabetic polyneuropathy (HCC) 03/31/2011   Pure hypercholesterolemia 01/07/2009   Type 1 diabetes mellitus with complications (HCC)  01/06/2009   B12 DEFICIENCY 01/06/2009   Essential hypertension 01/06/2009   Coronary atherosclerosis 01/06/2009    Orientation RESPIRATION BLADDER Height & Weight     Pilant, Time, Situation, Place  Normal Continent Weight: 89.1 kg Height:  6' (182.9 cm)  BEHAVIORAL SYMPTOMS/MOOD NEUROLOGICAL BOWEL NUTRITION STATUS     (n/a) Continent Diet (see d/c summary)  AMBULATORY STATUS COMMUNICATION OF NEEDS Skin   Extensive Assist Verbally Surgical wounds (dry flaky redness bilateral arms and legs)                       Personal Care Assistance Level of Assistance  Bathing, Feeding, Dressing Bathing Assistance: Maximum assistance Feeding assistance: Maximum assistance Dressing Assistance: Maximum assistance     Functional Limitations Info  Sight, Hearing, Speech Sight Info: Adequate (slurred/dysarthria) Hearing Info: Impaired (right and left hearing aids) Speech Info: Adequate (slurred / dysarthria)    SPECIAL CARE FACTORS FREQUENCY  PT (By licensed PT), OT (By licensed OT)     PT Frequency: 5X/wk OT Frequency: 5X.wk            Contractures Contractures Info: Not present    Additional Factors Info  Code Status, Allergies, Psychotropic, Insulin  Sliding Scale, Isolation Precautions, Suctioning Needs Code Status Info: DNR- Limited Allergies Info: NKA Psychotropic Info: see d/c summary Insulin  Sliding Scale Info: see d/c summary Isolation Precautions Info: n/a Suctioning Needs: n/a   Current Medications (02/13/2023):  This is the current hospital active medication list Current Facility-Administered Medications  Medication Dose Route Frequency Provider Last Rate Last Admin   acetaminophen  (TYLENOL ) tablet 650 mg  650 mg Oral Q6H PRN Shona Terry SAILOR, DO       aspirin  chewable tablet 81 mg  81 mg Oral Daily Shona Terry SAILOR, DO   81 mg at 02/13/23 9153   atorvastatin  (LIPITOR) tablet 10 mg  10 mg Oral QHS Shona Terry N, DO   10 mg at 02/12/23 2007   clopidogrel  (PLAVIX )  tablet 75 mg  75 mg Oral Daily Hall, Carole N, DO   75 mg at 02/13/23 9156   dextrose  50 % solution 0-50 mL  0-50 mL Intravenous PRN Cardama, Raynell Moder, MD       DULoxetine  (CYMBALTA ) DR capsule 60 mg  60 mg Oral BID Hall, Carole N, DO   60 mg at 02/13/23 9157   enoxaparin  (LOVENOX ) injection 40 mg  40 mg Subcutaneous Q24H Hall, Carole N, DO   40 mg at 02/13/23 9156   feeding supplement (GLUCERNA SHAKE) (GLUCERNA SHAKE) liquid 237 mL  237 mL Oral TID BM Amin, Ankit C, MD   237 mL at 02/13/23 0902   finasteride  (PROSCAR ) tablet 5 mg  5 mg Oral q morning Shona Terry N, DO   5 mg at 02/13/23 9156   glucagon  (human recombinant) (GLUCAGEN ) injection 1 mg  1 mg Intravenous PRN Amin, Ankit C, MD   1 mg at 02/11/23 1700   guaiFENesin  (ROBITUSSIN) 100 MG/5ML liquid 5 mL  5 mL Oral Q4H PRN Amin, Ankit C, MD       hydrALAZINE  (APRESOLINE ) injection 10 mg  10 mg Intravenous Q4H PRN Amin, Ankit C, MD   10 mg at 02/13/23 0033   insulin  aspart (novoLOG ) injection 0-5 Units  0-5 Units Subcutaneous QHS Sheikh, Omair Markle, DO   4 Units at 02/11/23 2302   insulin  aspart (novoLOG ) injection 0-9 Units  0-9 Units Subcutaneous TID WC Amin, Ankit C, MD       insulin  aspart (novoLOG ) injection 3 Units  3 Units Subcutaneous TID WC Amin, Ankit C, MD       insulin  glargine-yfgn (SEMGLEE ) injection 18 Units  18 Units Subcutaneous Daily Amin, Ankit C, MD       ipratropium-albuterol  (DUONEB) 0.5-2.5 (3) MG/3ML nebulizer solution 3 mL  3 mL Nebulization Q4H PRN Amin, Ankit C, MD       lactobacillus (FLORANEX/LACTINEX) granules 1 g  1 g Oral TID WC Hall, Carole N, DO   1 g at 02/13/23 9157   melatonin tablet 5 mg  5 mg Oral QHS PRN Shona Terry N, DO   5 mg at 02/12/23 2007   metoprolol  tartrate (LOPRESSOR ) injection 5 mg  5 mg Intravenous Q4H PRN Amin, Ankit C, MD       metoprolol  tartrate (LOPRESSOR ) tablet 12.5 mg  12.5 mg Oral BID Hall, Carole N, DO   12.5 mg at 02/13/23 9156   Oral care mouth rinse  15 mL Mouth Rinse  PRN Amin, Ankit C, MD       oxyCODONE  (Oxy IR/ROXICODONE ) immediate release tablet 15 mg  15 mg Oral Q6H PRN Hall, Carole N, DO   15 mg at 02/13/23 0844   polyethylene glycol (MIRALAX  / GLYCOLAX ) packet 17 g  17 g Oral Daily PRN Shona Terry N, DO       polyethylene glycol (MIRALAX  / GLYCOLAX ) packet 17 g  17 g Oral BID Shona Terry N, DO   17 g at 02/12/23 2010   prochlorperazine  (COMPAZINE ) injection 5 mg  5 mg Intravenous Q6H PRN Shona Terry SAILOR, DO  senna-docusate (Senokot-S) tablet 1 tablet  1 tablet Oral BID Shona Laurence N, DO   1 tablet at 02/13/23 9156   senna-docusate (Senokot-S) tablet 1 tablet  1 tablet Oral QHS PRN Amin, Ankit C, MD       sodium zirconium cyclosilicate  (LOKELMA ) packet 10 g  10 g Oral Once Amin, Ankit C, MD       tapentadol  (NUCYNTA ) 12 hr tablet 200 mg  200 mg Oral Q12H Sheikh, Omair Latif, DO   200 mg at 02/13/23 0843   traZODone  (DESYREL ) tablet 150 mg  150 mg Oral QHS Shona Laurence N, DO   150 mg at 02/12/23 2006     Discharge Medications: Please see discharge summary for a list of discharge medications.  Relevant Imaging Results:  Relevant Lab Results:   Additional Information SS # [id number]  Toy LITTIE Agar, RN

## 2023-02-13 NOTE — Progress Notes (Signed)
 Physical Therapy Treatment Patient Details Name: [name] Kardell Lapp Jr. MRN: [id number] DOB: Jul 14, 1945 Today's Date: 02/13/2023   History of Present Illness Pt is a 78 y/o male admitted 02/07/23 from [facility] with DKA.  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 He is WBAT in the post-op shoe. 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    PT Comments  Pt admitted with above diagnosis.  Pt currently with functional limitations due to the deficits listed below (see PT Problem List). Pt in bed when PT arrived. Pt agreeable to therapy intervention. Pt reports his wife has not brought the L post op shoe to the hospital at this time. Pt Maggio limiting LLE WB with transfer tasks. Pt is S for supine to sit, min A and cues with use of RW for SPT bed to recliner. Pt left in recliner all needs in place, it is anticipated pt will return to [facility] SNF for continued rehab services today. Pt will benefit from acute skilled PT to increase their independence and safety with mobility to allow discharge.        If plan is discharge home, recommend the following: A lot of help with bathing/dressing/bathroom;Assistance with cooking/housework;Assist for transportation;Help with stairs or ramp for entrance;A lot of help with walking and/or transfers   Can travel by private vehicle     No  Equipment Recommendations  None recommended by PT    Recommendations for Other Services       Precautions / Restrictions Precautions Precautions: Fall Required Braces or Orthoses: Other Brace Other Brace: Post-op shoe for B feet (these are at [facility], wife has not provided at this time Restrictions Weight Bearing Restrictions Per  Provider Order: No LLE Weight Bearing Per Provider Order: Weight bearing as tolerated Other Position/Activity Restrictions: In post-op shoe.     Mobility  Bed Mobility Overal bed mobility: Needs Assistance Bed Mobility: Supine to Sit     Supine to sit: Supervision, HOB elevated, Used rails     General bed mobility comments: cues and increased time    Transfers Overall transfer level: Needs assistance Equipment used: Rolling walker (2 wheels) Transfers: Bed to chair/wheelchair/BSC Sit to Stand: Min assist, From elevated surface Stand pivot transfers: Min assist         General transfer comment: pts wife has not provided post op shoe at this time, pt Gambino limiting L LE WB with transfer tasks, noted L UE weakness and occational involuntary movements of all extemities with pt attributing to fluccuating blood glucose levels with pt demonstrating instability and requiring cues for safety    Ambulation/Gait               General Gait Details: Did not progress to gait training at this time, no post op shoe .   Stairs             Wheelchair Mobility     Tilt Bed    Modified Rankin (Stroke Patients Only)       Balance Overall balance assessment: Needs assistance Sitting-balance support: Feet supported, No upper extremity supported Sitting balance-Leahy Scale: Good     Standing balance support: Reliant on assistive device for balance, During functional activity, Bilateral upper extremity supported Standing  balance-Leahy Scale: Poor                              Cognition Arousal: Alert Behavior During Therapy: WFL for tasks assessed/performed Overall Cognitive Status: Within Functional Limits for tasks assessed Area of Impairment: Safety/judgement                         Safety/Judgement: Decreased awareness of safety, Decreased awareness of deficits              Exercises      General Comments General comments (skin  integrity, edema, etc.): MD present during a portion of PT intervention and communicated with pt concerning blood glucose levels and goals as well as Pt to transition to SNF today      Pertinent Vitals/Pain Pain Assessment Pain Assessment: No/denies pain    Home Living                          Prior Function            PT Goals (current goals can now be found in the care plan section) Acute Rehab PT Goals Patient Stated Goal: to get strong enough to go home PT Goal Formulation: With patient Time For Goal Achievement: 02/23/23 Potential to Achieve Goals: Good Progress towards PT goals: Progressing toward goals    Frequency    Min 1X/week      PT Plan      Co-evaluation              AM-PAC PT 6 Clicks Mobility   Outcome Measure  Help needed turning from your back to your side while in a flat bed without using bedrails?: None Help needed moving from lying on your back to sitting on the side of a flat bed without using bedrails?: None Help needed moving to and from a bed to a chair (including a wheelchair)?: A Little Help needed standing up from a chair using your arms (e.g., wheelchair or bedside chair)?: A Little Help needed to walk in hospital room?: Total Help needed climbing 3-5 steps with a railing? : Total 6 Click Score: 16    End of Session Equipment Utilized During Treatment: Gait belt Activity Tolerance: Patient tolerated treatment well Patient left: in chair;with call bell/phone within reach Nurse Communication: Mobility status PT Visit Diagnosis: Unsteadiness on feet (R26.81);Difficulty in walking, not elsewhere classified (R26.2)     Time: 8977-8943 PT Time Calculation (min) (ACUTE ONLY): 34 min  Charges:    $Therapeutic Activity: 23-37 mins PT General Charges $$ ACUTE PT VISIT: 1 Visit                     [place], PT Acute Rehab    [place] VEAR Drone 02/13/2023, 5:04 PM

## 2023-02-13 NOTE — Progress Notes (Signed)
 Pt discharged to [facility] skilled nursing facility. Report called and given to Lyda Jester, LPN at facility. All of nurse's questions answered to his satisfaction. Pt left unit on stretcher pushed by [facility] staff. Left in stable condition.

## 2023-02-13 NOTE — TOC Initial Note (Signed)
 Transition of Care (TOC) - Initial/Assessment Note    Patient Details  Name: [name] Nudelman Arey Jr. MRN: [id number] Date of Birth: 09-29-1945  Transition of Care [facility]) CM/SW Contact:    Toy LITTIE Agar, RN Phone Number:[phone]  Clinical Narrative:                 TOC following patient with high risk for readmission. Patient has recommendation of SNF. Patient admitted from [facility]. CM followed up with Admissions director Britney who confirms that patient is able to return to [facility] today. MD has been made aware.   Expected Discharge Plan: Skilled Nursing Facility Barriers to Discharge: No Barriers Identified   Patient Goals and CMS Choice Patient states their goals for this hospitalization and ongoing recovery are:: to return to SNF for rehab CMS Medicare.gov Compare Post Acute Care list provided to::  (n/a wants to go back to facility they came from) Choice offered to / list presented to : NA [employer] ownership interest in [facility].provided to::  (n/a)    Expected Discharge Plan and Services In-house Referral: NA Discharge Planning Services: CM Consult Post Acute Care Choice: NA Living arrangements for the past 2 months: Single Family Home Expected Discharge Date: 02/13/23               DME Arranged: N/A DME Agency: NA       HH Arranged:  (n/a) HH Agency: NA        Prior Living Arrangements/Services Living arrangements for the past 2 months: Single Family Home Lives with:: Spouse Patient language and need for interpreter reviewed:: Yes Do you feel safe going back to the place where you live?: Yes      Need for Family Participation in Patient Care: Yes (Comment) Care giver support system in place?: Yes (comment) Current home services: Other (comment) (From [employer]) Criminal Activity/Legal Involvement Pertinent to Current Situation/Hospitalization: No - Comment as needed  Activities of Daily Living   ADL Screening (condition at time of  admission) Independently performs ADLs?: No Does the patient have a NEW difficulty with bathing/dressing/toileting/Dandy-feeding that is expected to last >3 days?: No Does the patient have a NEW difficulty with getting in/out of bed, walking, or climbing stairs that is expected to last >3 days?: No Does the patient have a NEW difficulty with communication that is expected to last >3 days?: No Is the patient deaf or have difficulty hearing?: No Does the patient have difficulty seeing, even when wearing glasses/contacts?: No Does the patient have difficulty concentrating, remembering, or making decisions?: No  Permission Sought/Granted Permission sought to share information with : Family Supports Permission granted to share information with : Yes, Verbal Permission Granted  Share Information with NAME: [name]        Permission granted to share info w Contact Information: [phone]  Emotional Assessment Appearance:: Appears stated age Attitude/Demeanor/Rapport: Engaged Affect (typically observed): Accepting, Pleasant Orientation: : Oriented to [place], Oriented to Place, Oriented to  Time, Oriented to Situation Alcohol  / Substance Use: Not Applicable Psych Involvement: No (comment)  Admission diagnosis:  DKA, type 1 (HCC) [E10.10] DKA, type 2 (HCC) [E11.10] Diabetic ketoacidosis without coma associated with type 1 diabetes mellitus (HCC) [E10.10] Patient Active Problem List   Diagnosis Date Noted   Contusion of second toe of left foot 02/09/2023   Status post amputation of left great toe (HCC) 02/09/2023   DKA, type 1 (HCC) 02/08/2023   DKA, type 2 (HCC) 02/08/2023   Osteomyelitis of great toe of  left foot (HCC) 01/30/2023   Chronic kidney disease, stage 3a (HCC) 10/10/2022   Diabetic ulcer of toe (HCC) 10/10/2022   Thrombocytopenia (HCC) 10/10/2022   Local infection of skin and subcutaneous tissue 09/21/2022   Chronic pain syndrome 04/01/2017   Chronic kidney disease, stage  II (mild) 03/31/2017   Coronary artery disease involving native coronary artery 03/31/2017   Depression 03/31/2017   Fecal impaction (HCC) 03/31/2017   History of CVA (cerebrovascular accident) 03/31/2017   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/05/2015   Other fatigue 10/05/2015   Dyspnea 10/05/2015   Late effects of cerebrovascular accident 04/20/2013   Multiple sclerosis (HCC) 06/04/2012   Cerebral artery occlusion with cerebral infarction (HCC) 03/31/2011   Abnormality of gait 03/31/2011   Diabetic polyneuropathy (HCC) 03/31/2011   Pure hypercholesterolemia 01/07/2009   Type 1 diabetes mellitus with complications (HCC) 01/06/2009   B12 DEFICIENCY 01/06/2009   Essential hypertension 01/06/2009   Coronary atherosclerosis 01/06/2009   PCP:  Shepard Ade, MD Pharmacy:   [facility] APFS No address on file   [facility] [address], [address] [place] [address] Phone: [phone] Fax: [phone]  [facility] [place], [place] - [address] [place] [address] Phone: [phone] Fax: [phone]     Social Drivers of Health (SDOH) Social History: SDOH Screenings   Food Insecurity: No Food Insecurity (02/09/2023)  Housing: Low Risk  (02/09/2023)  Transportation Needs: No Transportation Needs (02/09/2023)  Utilities: Not At Risk (02/09/2023)  Social Connections: Moderately Integrated (02/09/2023)  Tobacco Use: Medium Risk (02/07/2023)   SDOH Interventions:     Readmission Risk Interventions    02/13/2023   10:26 AM  Readmission Risk Prevention Plan  Transportation Screening Complete  PCP or Specialist Appt within 3-5 Days Complete  Social Work Consult for Recovery Care Planning/Counseling Complete  Palliative Care Screening Not Applicable  Medication Review Oceanographer)  Referral to Pharmacy

## 2023-02-13 NOTE — Discharge Summary (Signed)
 Physician Discharge Summary  [name]. FMW:[id number] DOB: [birth year] DOA: 02/07/2023  PCP: Shepard Ade, MD  Admit date: 02/07/2023 Discharge date: 02/13/2023  Admitted From: snF Disposition:  SNF   Recommendations for Outpatient Follow-up:  Follow up with PCP in 1-2 weeks Please obtain BMP/CBC in one week your next doctors visit.  Currently on Lantus  25 units daily, sliding scale.  Further adjust as appropriate including long-acting medication, sliding scale and/or adding Premeal insulin . PATIENT CANNOT MISS HIS INSULIN , PLEASE ADMINISTER AND ADJUST AS NEEDED.  Home Health: SNF Equipment/Devices: Discharge Condition: Stable CODE STATUS: DNR Diet recommendation: Diabetic  Brief/Interim Summary: Brief Narrative:  78 year old with history of DM1, diabetic polyneuropathy, CKD 3B, Charcot's arthropathy with recent toe amputation, CAD status post PCI, CVA, chronic pain recently admitted from 12/23 - 12/27 for great left toe osteomyelitis and underwent amputation on 12/23..  Admitted this time for DKA, started on DKA protocol.  Eventually blood glucose stabilized.  PT/OT recommended SNF.   Assessment & Plan:  Active Problems:   DKA, type 1 (HCC)   DKA, type 2 (HCC)   Contusion of second toe of left foot   Status post amputation of left great toe (HCC)    Diabetic ketoacidosis, resolved History of diabetes mellitus type 1 Diabetic polyneuropathy - DKA protocol, slowly transition to subcu home regimen including sliding scale and Accu-Cheks.  Labile blood glucose.  Seen by diabetic coordinator continue to adjust his long-acting insulin , sliding scale and Premeal.  Eventually would benefit from outpatient follow-up with endocrinology.  A1c 6.5.  AKI on CKD stage IIIa, resolved - Baseline creatinine 1.2, peaked at 1.58  Status post left great toe amputation History of left foot Charcot arthropathy - Recent amputation on 12/23.  Seen by podiatry during this admission,  recommending routine dressing and outpatient follow-up. On Total 7 days of Augmentin  since dc on 12/27.  Change bandage on second toe daily with antibiotic ointment and Band-Aid. Gauze dressing for toe amputation may remain intact until outpatient follow-up   Hyponatremia - This is pseudohyponatremia in setting of hyperglycemia  CAD status post PCI - Continue home including aspirin , Plavix , statin, Lopressor   Essential hypertension - Continue home Norvasc , metoprolol   PT/OT-SNF  DVT prophylaxis: enoxaparin  (LOVENOX ) injection 40 mg Start: 02/08/23 1000   Code Status: Limited: Do not attempt resuscitation (DNR) -DNR-LIMITED -Do Not Intubate/DNI  Family Communication:  Called Melanie.  Status is: Inpatient Remains inpatient appropriate because: SNF placement.     Subjective: Doing well 1 episode of hyperglycemia this morning requiring corrective sliding scale. No complaints otherwise.   Examination:  General exam: Appears calm and comfortable  Respiratory system: Clear to auscultation. Respiratory effort normal. Cardiovascular system: S1 & S2 heard, RRR. No JVD, murmurs, rubs, gallops or clicks. No pedal edema. Gastrointestinal system: Abdomen is nondistended, soft and nontender. No organomegaly or masses felt. Normal bowel sounds heard. Central nervous system: Alert and oriented. No focal neurological deficits. Extremities: Symmetric 5 x 5 power. Skin: Left foot dressing in place.  Psychiatry: Judgement and insight appear normal. Mood & affect appropriate.    Discharge Diagnoses:  Active Problems:   DKA, type 1 (HCC)   DKA, type 2 (HCC)   Contusion of second toe of left foot   Status post amputation of left great toe [facility])      Discharge Exam: Vitals:   02/13/23 0518 02/13/23 0555  BP: 139/63   Pulse: 72   Resp: 14   Temp: 98.1 F (36.7 C)   SpO2: ROLLEN)  89% 93%   Vitals:   02/12/23 1355 02/12/23 2055 02/13/23 0518 02/13/23 0555  BP: (!) 146/85 (!) 184/72  139/63   Pulse: 80 88 72   Resp: 14 16 14    Temp: 98.3 F (36.8 C) 99.2 F (37.3 C) 98.1 F (36.7 C)   TempSrc: Oral Oral Oral   SpO2: 93% 93% (!) 89% 93%  Weight:      Height:          Discharge Instructions   Allergies as of 02/13/2023   No Known Allergies      Medication List     STOP taking these medications    acetaminophen  500 MG tablet Commonly known as: TYLENOL    amoxicillin -clavulanate 875-125 MG tablet Commonly known as: AUGMENTIN    insulin  glargine-yfgn 100 UNIT/ML injection Commonly known as: SEMGLEE  Replaced by: insulin  glargine 100 UNIT/ML injection   lactobacillus acidophilus & bulgar chewable tablet   TUBERCULIN PPD ID       TAKE these medications    amLODipine  5 MG tablet Commonly known as: NORVASC  Take 5 mg by mouth every morning.   aspirin  81 MG tablet Take 81 mg by mouth daily.   atorvastatin  10 MG tablet Commonly known as: LIPITOR Take 10 mg by mouth at bedtime.   bisacodyl  5 MG EC tablet Generic drug: bisacodyl  Take 2 tablets (10 mg total) by mouth daily as needed for moderate constipation.   clopidogrel  75 MG tablet Commonly known as: PLAVIX  Take 75 mg by mouth daily.   DULoxetine  60 MG capsule Commonly known as: CYMBALTA  Take 60 mg by mouth 2 (two) times daily.   finasteride  5 MG tablet Commonly known as: PROSCAR  Take 5 mg by mouth every morning.   HUMULIN  R U-500 KWIKPEN [place] Inject 1 application  into the skin See admin instructions. Inject as per sliding scale: 11-150= 4 units 151-200= 8 units 201-250=10 units 251-300=12 units ; 301-350=14 units 351-400=16 units Higher than 400 call MD   insulin  glargine 100 UNIT/ML injection Commonly known as: Lantus  Inject 0.25 mLs (25 Units total) into the skin daily. Replaces: insulin  glargine-yfgn 100 UNIT/ML injection   metoprolol  tartrate 25 MG tablet Commonly known as: LOPRESSOR  Take 25 mg by mouth 2 (two) times daily.   Nucynta  ER 200 MG Tb12 Generic drug:  Tapentadol  HCl Take 200 mg by mouth in the morning and at bedtime.   nystatin  cream-hydrocortisone  cream-zinc  oxide Apply topically 3 (three) times daily.   ondansetron  4 MG tablet Commonly known as: ZOFRAN  Take 1 tablet (4 mg total) by mouth every 6 (six) hours as needed for nausea.   oxyCODONE  15 MG immediate release tablet Commonly known as: ROXICODONE  Take 1 tablet (15 mg total) by mouth every 6 (six) hours as needed for pain.   polyethylene glycol powder 17 GM/SCOOP powder Commonly known as: GLYCOLAX /MIRALAX  Take 17 g by mouth 2 (two) times daily.   PROBIOTIC DAILY PO Take 1 capsule by mouth in the morning, at noon, and at bedtime.   Senna-Plus 8.6-50 MG tablet Generic drug: senna-docusate Take 1 tablet by mouth 2 (two) times daily.   traZODone  150 MG tablet Commonly known as: DESYREL  Take 150 mg by mouth at bedtime.        Follow-up Information     Shepard Ade, MD Follow up in 1 week(s).   Specialty: Internal Medicine Contact information: [address] [place] [address] [phone]         Shepard Ade, MD .   Specialty: Internal Medicine Contact information: [phone]  [address] [address] [phone]                No Known Allergies  You were cared for by a hospitalist during your hospital stay. If you have any questions about your discharge medications or the care you received while you were in the hospital after you are discharged, you can call the unit and asked to speak with the hospitalist on call if the hospitalist that took care of you is not available. Once you are discharged, your primary care physician will handle any further medical issues. Please note that no refills for any discharge medications will be authorized once you are discharged, as it is imperative that you return to your primary care physician (or establish a relationship with a primary care physician if you do not have one) for your aftercare needs so that  they can reassess your need for medications and monitor your lab values.  You were cared for by a hospitalist during your hospital stay. If you have any questions about your discharge medications or the care you received while you were in the hospital after you are discharged, you can call the unit and asked to speak with the hospitalist on call if the hospitalist that took care of you is not available. Once you are discharged, your primary care physician will handle any further medical issues. Please note that NO REFILLS for any discharge medications will be authorized once you are discharged, as it is imperative that you return to your primary care physician (or establish a relationship with a primary care physician if you do not have one) for your aftercare needs so that they can reassess your need for medications and monitor your lab values.  Please request your Prim.MD to go over all Hospital Tests and Procedure/Radiological results at the follow up, please get all Hospital records sent to your Prim MD by signing hospital release before you go home.  Get CBC, CMP, 2 view Chest X ray checked  by Primary MD during your next visit or SNF MD in 5-7 days ( we routinely change or add medications that can affect your baseline labs and fluid status, therefore we recommend that you get the mentioned basic workup next visit with your PCP, your PCP may decide not to get them or add new tests based on their clinical decision)  On your next visit with your primary care physician please Get Medicines reviewed and adjusted.  If you experience worsening of your admission symptoms, develop shortness of breath, life threatening emergency, suicidal or homicidal thoughts you must seek medical attention immediately by calling 911 or calling your MD immediately  if symptoms less severe.  You Must read complete instructions/literature along with all the possible adverse reactions/side effects for all the Medicines you  take and that have been prescribed to you. Take any new Medicines after you have completely understood and accpet all the possible adverse reactions/side effects.   Do not drive, operate heavy machinery, perform activities at heights, swimming or participation in water  activities or provide baby sitting services if your were admitted for syncope or siezures until you have seen by Primary MD or a Neurologist and advised to do so again.  Do not drive when taking Pain medications.   Procedures/Studies: DG Foot 2 Views Left Result Date: 02/08/2023 CLINICAL DATA:  Type 1 diabetic with Charcot arthropathy.  Pain. EXAM: LEFT FOOT - 2 VIEW COMPARISON:  01/30/2023 FINDINGS: Midfoot fusion and first toe amputation. There is  a bandage over the great toe amputation site without soft tissue gas or emphysema. Chronic midfoot collapse. Remote second and third metatarsal fractures. Generalized osteopenia. IMPRESSION: No acute osseous finding. Electronically Signed   By: Dorn Roulette M.D.   On: 02/08/2023 10:07   DG Foot Complete Left Result Date: 02/07/2023 Please see detailed radiograph report in office note.  DG Foot 2 Views Left Result Date: 01/30/2023 CLINICAL DATA:  Postoperative state. EXAM: LEFT FOOT - 2 VIEW COMPARISON:  Left foot radiograph dated 01/30/2023. FINDINGS: Status post amputation of the phalanges of the great toe. No acute fracture or dislocation. The bones are osteopenic. There is pes planus. Degenerative changes and ankylosis of the midfoot. The soft tissues are grossly unremarkable. Overlying dressing noted. IMPRESSION: Status post amputation of the phalanges of the great toe. Electronically Signed   By: Vanetta Chou M.D.   On: 01/30/2023 20:52   DG Foot Complete Left Result Date: 01/30/2023 Please see detailed radiograph report in office note.    The results of significant diagnostics from this hospitalization (including imaging, microbiology, ancillary and laboratory) are  listed below for reference.     Microbiology: No results found for this or any previous visit (from the past 240 hours).   Labs: BNP (last 3 results) No results for input(s): BNP in the last 8760 hours. Basic Metabolic Panel: Recent Labs  Lab 02/08/23 0600 02/08/23 1114 02/09/23 0654 02/10/23 0621 02/11/23 0752 02/12/23 0705 02/13/23 0536  NA 131*   < > 131* 136 138 135 133*  K 4.1   < > 4.4 3.9 4.0 4.3 5.2*  CL 100   < > 99 106 105 101 98  CO2 16*   < > 19* 24 27 29 28   GLUCOSE 353*   < > 360* 169* 120* 145* 386*  BUN 32*   < > 22 20 20 21  26*  CREATININE 1.52*   < > 1.29* 0.95 0.97 1.10 1.34*  CALCIUM  7.9*   < > 8.2* 8.1* 8.2* 8.3* 8.3*  MG 2.1  --  2.0 1.9 2.0 2.1 2.1  PHOS 3.6  --  3.2 3.2 3.5  --   --    < > = values in this interval not displayed.   Liver Function Tests: Recent Labs  Lab 02/07/23 2336 02/09/23 0654 02/10/23 0621  AST 37 31 24  ALT 41 31 24  ALKPHOS 195* 149* 125  BILITOT 2.0* 1.4* 0.8  PROT 7.6 6.4* 5.7*  ALBUMIN  3.1* 2.6* 2.3*   No results for input(s): LIPASE, AMYLASE in the last 168 hours. No results for input(s): AMMONIA in the last 168 hours. CBC: Recent Labs  Lab 02/07/23 2336 02/07/23 2355 02/09/23 0654 02/10/23 9378 02/11/23 0752 02/12/23 0705 02/13/23 0536  WBC 10.9*  --  9.3 5.9 5.3 5.9 4.8  NEUTROABS 8.0*  --  5.6 3.1  --   --   --   HGB 13.2   < > 13.1 12.0* 11.9* 11.8* 12.4*  HCT 40.3   < > 38.4* 36.3* 36.4* 35.4* 38.5*  MCV 102.5*  --  103.2* 103.7* 104.9* 104.1* 104.9*  PLT 119*  --  116* 103* 81* 82* 90*   < > = values in this interval not displayed.   Cardiac Enzymes: No results for input(s): CKTOTAL, CKMB, CKMBINDEX, TROPONINI in the last 168 hours. BNP: Invalid input(s): POCBNP CBG: Recent Labs  Lab 02/12/23 1129 02/12/23 1615 02/12/23 2018 02/12/23 2050 02/13/23 0830  GLUCAP 216* 141* 171* 204* 413*  D-Dimer No results for input(s): DDIMER in the last 72 hours. Hgb A1c No  results for input(s): HGBA1C in the last 72 hours. Lipid Profile No results for input(s): CHOL, HDL, LDLCALC, TRIG, CHOLHDL, LDLDIRECT in the last 72 hours. Thyroid  function studies No results for input(s): TSH, T4TOTAL, T3FREE, THYROIDAB in the last 72 hours.  Invalid input(s): FREET3 Anemia work up No results for input(s): VITAMINB12, FOLATE, FERRITIN, TIBC, IRON, RETICCTPCT in the last 72 hours. Urinalysis    Component Value Date/Time   COLORURINE YELLOW 02/08/2023 0154   APPEARANCEUR CLEAR 02/08/2023 0154   LABSPEC 1.018 02/08/2023 0154   PHURINE 5.0 02/08/2023 0154   GLUCOSEU >=500 (A) 02/08/2023 0154   HGBUR NEGATIVE 02/08/2023 0154   BILIRUBINUR NEGATIVE 02/08/2023 0154   KETONESUR 20 (A) 02/08/2023 0154   PROTEINUR 30 (A) 02/08/2023 0154   NITRITE NEGATIVE 02/08/2023 0154   LEUKOCYTESUR NEGATIVE 02/08/2023 0154   Sepsis Labs Recent Labs  Lab 02/10/23 0621 02/11/23 0752 02/12/23 0705 02/13/23 0536  WBC 5.9 5.3 5.9 4.8   Microbiology No results found for this or any previous visit (from the past 240 hours).   Time coordinating discharge:  I have spent 35 minutes face to face with the patient and on the ward discussing the patients care, assessment, plan and disposition with other care givers. >50% of the time was devoted counseling the patient about the risks and benefits of treatment/Discharge disposition and coordinating care.   SIGNED:   Burgess JAYSON Dare, MD  [facility] 02/13/2023, 11:04 AM   If 7PM-7AM, please contact night-coverage

## 2023-02-15 DIAGNOSIS — E11621 Type 2 diabetes mellitus with foot ulcer: Secondary | ICD-10-CM | POA: Diagnosis not present

## 2023-02-15 DIAGNOSIS — F339 Major depressive disorder, recurrent, unspecified: Secondary | ICD-10-CM | POA: Diagnosis not present

## 2023-02-15 DIAGNOSIS — E785 Hyperlipidemia, unspecified: Secondary | ICD-10-CM | POA: Diagnosis not present

## 2023-02-15 DIAGNOSIS — L97518 Non-pressure chronic ulcer of other part of right foot with other specified severity: Secondary | ICD-10-CM | POA: Diagnosis not present

## 2023-02-16 DIAGNOSIS — Z89412 Acquired absence of left great toe: Secondary | ICD-10-CM | POA: Diagnosis not present

## 2023-02-16 DIAGNOSIS — E785 Hyperlipidemia, unspecified: Secondary | ICD-10-CM | POA: Diagnosis not present

## 2023-02-16 DIAGNOSIS — E1040 Type 1 diabetes mellitus with diabetic neuropathy, unspecified: Secondary | ICD-10-CM | POA: Diagnosis not present

## 2023-02-16 DIAGNOSIS — I10 Essential (primary) hypertension: Secondary | ICD-10-CM | POA: Diagnosis not present

## 2023-02-20 ENCOUNTER — Encounter: Payer: BLUE CROSS/BLUE SHIELD | Admitting: Podiatry

## 2023-02-21 ENCOUNTER — Telehealth: Payer: Self-pay | Admitting: Podiatry

## 2023-02-21 ENCOUNTER — Encounter: Payer: Medicare Other | Admitting: Podiatry

## 2023-02-21 NOTE — Telephone Encounter (Signed)
 Spoke to pts wife and we have r/s appt to 1/21. She said thank you.

## 2023-02-21 NOTE — Telephone Encounter (Signed)
 Pts wife called and pt just got home from the nursing home and is very weak and can hardly walk. He has an appt today and its his pov # 2 dos 12/23. Would it be ok for him to wait to this Thursday but you are full or  next Tuesday to get the stitches out? She does not think she could even get him to the car today.Please advise

## 2023-02-27 ENCOUNTER — Encounter: Payer: BLUE CROSS/BLUE SHIELD | Admitting: Podiatry

## 2023-02-28 ENCOUNTER — Inpatient Hospital Stay (HOSPITAL_COMMUNITY): Payer: Medicare Other

## 2023-02-28 ENCOUNTER — Encounter (HOSPITAL_COMMUNITY): Payer: Self-pay | Admitting: Internal Medicine

## 2023-02-28 ENCOUNTER — Ambulatory Visit (INDEPENDENT_AMBULATORY_CARE_PROVIDER_SITE_OTHER): Payer: Medicare Other | Admitting: Podiatry

## 2023-02-28 ENCOUNTER — Encounter (HOSPITAL_COMMUNITY): Payer: Self-pay

## 2023-02-28 ENCOUNTER — Ambulatory Visit (INDEPENDENT_AMBULATORY_CARE_PROVIDER_SITE_OTHER): Payer: Medicare Other

## 2023-02-28 ENCOUNTER — Other Ambulatory Visit: Payer: Self-pay

## 2023-02-28 ENCOUNTER — Inpatient Hospital Stay (HOSPITAL_COMMUNITY)
Admission: AD | Admit: 2023-02-28 | Discharge: 2023-03-03 | DRG: 617 | Disposition: A | Discharge status: Home Health | Payer: Medicare Other | Attending: Internal Medicine | Admitting: Internal Medicine

## 2023-02-28 ENCOUNTER — Other Ambulatory Visit: Payer: Self-pay | Admitting: Internal Medicine

## 2023-02-28 DIAGNOSIS — Z89412 Acquired absence of left great toe: Secondary | ICD-10-CM | POA: Diagnosis not present

## 2023-02-28 DIAGNOSIS — F32A Depression, unspecified: Secondary | ICD-10-CM | POA: Diagnosis present

## 2023-02-28 DIAGNOSIS — M778 Other enthesopathies, not elsewhere classified: Secondary | ICD-10-CM

## 2023-02-28 DIAGNOSIS — M86272 Subacute osteomyelitis, left ankle and foot: Secondary | ICD-10-CM

## 2023-02-28 DIAGNOSIS — E1069 Type 1 diabetes mellitus with other specified complication: Principal | ICD-10-CM | POA: Diagnosis present

## 2023-02-28 DIAGNOSIS — L97512 Non-pressure chronic ulcer of other part of right foot with fat layer exposed: Secondary | ICD-10-CM

## 2023-02-28 DIAGNOSIS — E1040 Type 1 diabetes mellitus with diabetic neuropathy, unspecified: Secondary | ICD-10-CM | POA: Diagnosis present

## 2023-02-28 DIAGNOSIS — Z79899 Other long term (current) drug therapy: Secondary | ICD-10-CM

## 2023-02-28 DIAGNOSIS — Z7902 Long term (current) use of antithrombotics/antiplatelets: Secondary | ICD-10-CM

## 2023-02-28 DIAGNOSIS — E108 Type 1 diabetes mellitus with unspecified complications: Secondary | ICD-10-CM | POA: Diagnosis not present

## 2023-02-28 DIAGNOSIS — Z87891 Personal history of nicotine dependence: Secondary | ICD-10-CM

## 2023-02-28 DIAGNOSIS — Z8673 Personal history of transient ischemic attack (TIA), and cerebral infarction without residual deficits: Secondary | ICD-10-CM | POA: Diagnosis not present

## 2023-02-28 DIAGNOSIS — Z89411 Acquired absence of right great toe: Secondary | ICD-10-CM | POA: Diagnosis not present

## 2023-02-28 DIAGNOSIS — F419 Anxiety disorder, unspecified: Secondary | ICD-10-CM | POA: Diagnosis present

## 2023-02-28 DIAGNOSIS — I2510 Atherosclerotic heart disease of native coronary artery without angina pectoris: Secondary | ICD-10-CM | POA: Diagnosis present

## 2023-02-28 DIAGNOSIS — I252 Old myocardial infarction: Secondary | ICD-10-CM

## 2023-02-28 DIAGNOSIS — Z961 Presence of intraocular lens: Secondary | ICD-10-CM | POA: Diagnosis present

## 2023-02-28 DIAGNOSIS — E7800 Pure hypercholesterolemia, unspecified: Secondary | ICD-10-CM | POA: Diagnosis present

## 2023-02-28 DIAGNOSIS — M869 Osteomyelitis, unspecified: Secondary | ICD-10-CM | POA: Diagnosis present

## 2023-02-28 DIAGNOSIS — E875 Hyperkalemia: Secondary | ICD-10-CM | POA: Diagnosis not present

## 2023-02-28 DIAGNOSIS — I69354 Hemiplegia and hemiparesis following cerebral infarction affecting left non-dominant side: Secondary | ICD-10-CM

## 2023-02-28 DIAGNOSIS — L97514 Non-pressure chronic ulcer of other part of right foot with necrosis of bone: Secondary | ICD-10-CM | POA: Diagnosis not present

## 2023-02-28 DIAGNOSIS — E876 Hypokalemia: Secondary | ICD-10-CM | POA: Diagnosis not present

## 2023-02-28 DIAGNOSIS — G894 Chronic pain syndrome: Secondary | ICD-10-CM | POA: Diagnosis present

## 2023-02-28 DIAGNOSIS — N1831 Chronic kidney disease, stage 3a: Secondary | ICD-10-CM | POA: Diagnosis present

## 2023-02-28 DIAGNOSIS — M19071 Primary osteoarthritis, right ankle and foot: Secondary | ICD-10-CM | POA: Diagnosis not present

## 2023-02-28 DIAGNOSIS — E1065 Type 1 diabetes mellitus with hyperglycemia: Secondary | ICD-10-CM | POA: Diagnosis not present

## 2023-02-28 DIAGNOSIS — Z794 Long term (current) use of insulin: Secondary | ICD-10-CM | POA: Diagnosis not present

## 2023-02-28 DIAGNOSIS — I129 Hypertensive chronic kidney disease with stage 1 through stage 4 chronic kidney disease, or unspecified chronic kidney disease: Secondary | ICD-10-CM | POA: Diagnosis present

## 2023-02-28 DIAGNOSIS — Z66 Do not resuscitate: Secondary | ICD-10-CM | POA: Diagnosis present

## 2023-02-28 DIAGNOSIS — Z7982 Long term (current) use of aspirin: Secondary | ICD-10-CM

## 2023-02-28 DIAGNOSIS — K056 Periodontal disease, unspecified: Secondary | ICD-10-CM | POA: Diagnosis present

## 2023-02-28 DIAGNOSIS — Z9842 Cataract extraction status, left eye: Secondary | ICD-10-CM

## 2023-02-28 DIAGNOSIS — E871 Hypo-osmolality and hyponatremia: Secondary | ICD-10-CM | POA: Diagnosis present

## 2023-02-28 DIAGNOSIS — G63 Polyneuropathy in diseases classified elsewhere: Secondary | ICD-10-CM | POA: Diagnosis present

## 2023-02-28 DIAGNOSIS — M86172 Other acute osteomyelitis, left ankle and foot: Secondary | ICD-10-CM | POA: Diagnosis not present

## 2023-02-28 DIAGNOSIS — Z89422 Acquired absence of other left toe(s): Secondary | ICD-10-CM | POA: Diagnosis not present

## 2023-02-28 DIAGNOSIS — Z8249 Family history of ischemic heart disease and other diseases of the circulatory system: Secondary | ICD-10-CM | POA: Diagnosis not present

## 2023-02-28 DIAGNOSIS — G35 Multiple sclerosis: Secondary | ICD-10-CM | POA: Diagnosis present

## 2023-02-28 DIAGNOSIS — N400 Enlarged prostate without lower urinary tract symptoms: Secondary | ICD-10-CM | POA: Diagnosis present

## 2023-02-28 DIAGNOSIS — Z9841 Cataract extraction status, right eye: Secondary | ICD-10-CM

## 2023-02-28 DIAGNOSIS — L97524 Non-pressure chronic ulcer of other part of left foot with necrosis of bone: Secondary | ICD-10-CM | POA: Diagnosis not present

## 2023-02-28 DIAGNOSIS — E1022 Type 1 diabetes mellitus with diabetic chronic kidney disease: Secondary | ICD-10-CM | POA: Diagnosis present

## 2023-02-28 DIAGNOSIS — F418 Other specified anxiety disorders: Secondary | ICD-10-CM | POA: Diagnosis present

## 2023-02-28 DIAGNOSIS — D472 Monoclonal gammopathy: Secondary | ICD-10-CM | POA: Diagnosis present

## 2023-02-28 DIAGNOSIS — M86171 Other acute osteomyelitis, right ankle and foot: Secondary | ICD-10-CM | POA: Diagnosis not present

## 2023-02-28 DIAGNOSIS — I10 Essential (primary) hypertension: Secondary | ICD-10-CM | POA: Diagnosis present

## 2023-02-28 DIAGNOSIS — I96 Gangrene, not elsewhere classified: Secondary | ICD-10-CM | POA: Diagnosis not present

## 2023-02-28 DIAGNOSIS — Z955 Presence of coronary angioplasty implant and graft: Secondary | ICD-10-CM

## 2023-02-28 LAB — COMPREHENSIVE METABOLIC PANEL
ALT: 13 U/L (ref 0–44)
AST: 22 U/L (ref 15–41)
Albumin: 2.1 g/dL — ABNORMAL LOW (ref 3.5–5.0)
Alkaline Phosphatase: 203 U/L — ABNORMAL HIGH (ref 38–126)
Anion gap: 9 (ref 5–15)
BUN: 28 mg/dL — ABNORMAL HIGH (ref 8–23)
CO2: 26 mmol/L (ref 22–32)
Calcium: 8 mg/dL — ABNORMAL LOW (ref 8.9–10.3)
Chloride: 98 mmol/L (ref 98–111)
Creatinine, Ser: 1.92 mg/dL — ABNORMAL HIGH (ref 0.61–1.24)
GFR, Estimated: 36 mL/min — ABNORMAL LOW (ref >60)
Glucose, Bld: 204 mg/dL — ABNORMAL HIGH (ref 70–99)
Potassium: 5 mmol/L (ref 3.5–5.1)
Sodium: 133 mmol/L — ABNORMAL LOW (ref 135–145)
Total Bilirubin: 1 mg/dL (ref 0.0–1.2)
Total Protein: 6.5 g/dL (ref 6.5–8.1)

## 2023-02-28 LAB — C-REACTIVE PROTEIN: CRP: 10.4 mg/dL — ABNORMAL HIGH (ref <1.0)

## 2023-02-28 LAB — CBC WITH DIFFERENTIAL/PLATELET
Abs Immature Granulocytes: 0.03 10*3/uL (ref 0.00–0.07)
Basophils Absolute: 0 10*3/uL (ref 0.0–0.1)
Basophils Relative: 0 %
Eosinophils Absolute: 0.3 10*3/uL (ref 0.0–0.5)
Eosinophils Relative: 4 %
HCT: 41.4 % (ref 39.0–52.0)
Hemoglobin: 13.5 g/dL (ref 13.0–17.0)
Immature Granulocytes: 0 %
Lymphocytes Relative: 14 %
Lymphs Abs: 1 10*3/uL (ref 0.7–4.0)
MCH: 33.7 pg (ref 26.0–34.0)
MCHC: 32.6 g/dL (ref 30.0–36.0)
MCV: 103.2 fL — ABNORMAL HIGH (ref 80.0–100.0)
Monocytes Absolute: 0.8 10*3/uL (ref 0.1–1.0)
Monocytes Relative: 11 %
Neutro Abs: 5 10*3/uL (ref 1.7–7.7)
Neutrophils Relative %: 71 %
Platelets: 142 10*3/uL — ABNORMAL LOW (ref 150–400)
RBC: 4.01 MIL/uL — ABNORMAL LOW (ref 4.22–5.81)
RDW: 15.2 % (ref 11.5–15.5)
WBC: 7 10*3/uL (ref 4.0–10.5)
nRBC: 0 % (ref 0.0–0.2)

## 2023-02-28 LAB — GLUCOSE, CAPILLARY
Glucose-Capillary: 189 mg/dL — ABNORMAL HIGH (ref 70–99)
Glucose-Capillary: 236 mg/dL — ABNORMAL HIGH (ref 70–99)

## 2023-02-28 LAB — SURGICAL PCR SCREEN
MRSA, PCR: NEGATIVE
Staphylococcus aureus: NEGATIVE

## 2023-02-28 LAB — SEDIMENTATION RATE: Sed Rate: 43 mm/h — ABNORMAL HIGH (ref 0–16)

## 2023-02-28 MED ORDER — AMLODIPINE BESYLATE 5 MG PO TABS
5.0000 mg | ORAL_TABLET | Freq: Every morning | ORAL | Status: DC
  Administered 2023-03-01 – 2023-03-03 (×3): 5 mg via ORAL
  Filled 2023-02-28 (×3): qty 1

## 2023-02-28 MED ORDER — POLYETHYLENE GLYCOL 3350 17 G PO PACK: 17.0000 g | PACK | Freq: Every day | ORAL | Status: DC | PRN

## 2023-02-28 MED ORDER — ACETAMINOPHEN 650 MG RE SUPP: 650.0000 mg | Freq: Four times a day (QID) | RECTAL | Status: DC | PRN

## 2023-02-28 MED ORDER — OXYCODONE HCL 5 MG PO TABS
15.0000 mg | ORAL_TABLET | Freq: Four times a day (QID) | ORAL | Status: DC | PRN
  Administered 2023-02-28 – 2023-03-03 (×6): 15 mg via ORAL
  Filled 2023-02-28 (×6): qty 3

## 2023-02-28 MED ORDER — DULOXETINE HCL 60 MG PO CPEP
60.0000 mg | ORAL_CAPSULE | Freq: Two times a day (BID) | ORAL | Status: DC
  Administered 2023-02-28 – 2023-03-03 (×6): 60 mg via ORAL
  Filled 2023-02-28 (×6): qty 1

## 2023-02-28 MED ORDER — ACETAMINOPHEN 325 MG PO TABS: 650.0000 mg | ORAL_TABLET | Freq: Four times a day (QID) | ORAL | Status: DC | PRN

## 2023-02-28 MED ORDER — MUPIROCIN 2 % EX OINT
1.0000 | TOPICAL_OINTMENT | Freq: Two times a day (BID) | CUTANEOUS | Status: DC
  Administered 2023-03-01 – 2023-03-03 (×4): 1 via NASAL
  Filled 2023-02-28: qty 22

## 2023-02-28 MED ORDER — GADOBUTROL 1 MMOL/ML IV SOLN
9.0000 mL | Freq: Once | INTRAVENOUS | Status: AC | PRN
  Administered 2023-02-28: 9 mL via INTRAVENOUS

## 2023-02-28 MED ORDER — ATORVASTATIN CALCIUM 10 MG PO TABS
10.0000 mg | ORAL_TABLET | Freq: Every day | ORAL | Status: DC
  Administered 2023-02-28 – 2023-03-02 (×3): 10 mg via ORAL
  Filled 2023-02-28 (×3): qty 1

## 2023-02-28 MED ORDER — TRAZODONE HCL 50 MG PO TABS
150.0000 mg | ORAL_TABLET | Freq: Every day | ORAL | Status: DC
  Administered 2023-02-28 – 2023-03-02 (×3): 150 mg via ORAL
  Filled 2023-02-28 (×3): qty 1

## 2023-02-28 MED ORDER — HYDROMORPHONE HCL 1 MG/ML IJ SOLN
0.5000 mg | INTRAMUSCULAR | Status: DC | PRN
Start: 2023-02-28 — End: 2023-02-28

## 2023-02-28 MED ORDER — ASPIRIN 81 MG PO TBEC
81.0000 mg | DELAYED_RELEASE_TABLET | Freq: Every day | ORAL | Status: DC
  Administered 2023-03-01 – 2023-03-03 (×3): 81 mg via ORAL
  Filled 2023-02-28 (×3): qty 1

## 2023-02-28 MED ORDER — HYDROCODONE-ACETAMINOPHEN 5-325 MG PO TABS: 1.0000 | ORAL_TABLET | ORAL | Status: DC | PRN

## 2023-02-28 MED ORDER — METOPROLOL TARTRATE 25 MG PO TABS
25.0000 mg | ORAL_TABLET | Freq: Two times a day (BID) | ORAL | Status: DC
  Administered 2023-02-28 – 2023-03-03 (×5): 25 mg via ORAL
  Filled 2023-02-28 (×6): qty 1

## 2023-02-28 MED ORDER — CLOPIDOGREL BISULFATE 75 MG PO TABS
75.0000 mg | ORAL_TABLET | Freq: Every day | ORAL | Status: DC
  Administered 2023-03-01 – 2023-03-03 (×3): 75 mg via ORAL
  Filled 2023-02-28 (×3): qty 1

## 2023-02-28 MED ORDER — SODIUM CHLORIDE 0.9% FLUSH
3.0000 mL | Freq: Two times a day (BID) | INTRAVENOUS | Status: DC
  Administered 2023-02-28 – 2023-03-02 (×5): 3 mL via INTRAVENOUS

## 2023-02-28 NOTE — Progress Notes (Signed)
h  Subjective:  Patient ID: [name] Manis., male    DOB: 1946/02/04,  MRN: [id number]  Chief Complaint  Patient presents with   Post-op Follow-up    LT foot. Post op follow up. Surgical site is dry with dried blood, sutures are still present. He also has an ulcer/wound on the LT 3rd that the wife covered with ointment and gauze yesterday.     DOS: 01/30/2023 Procedure: Amputation of left great toe at MPJ level  78 y.o. male seen for post op check.  Patient presents for first office follow-up status post above procedure.  Unclear why he is not followed up sooner.  He is with his wife today.  He has been in a nursing home.  He wrote his wife reports the nursing home was very bad and they removed him from that and she has had difficulty taking care of him since then.  They note that a scab that was on the left second toe was removed and advertently and then there was some drainage and wound formation on the left second toe.  Right hallux also with ulceration and redness.  Worse from prior.  Review of Systems: Negative except as noted in the HPI. Denies N/V/F/Ch.   Objective:  There were no vitals filed for this visit. There is no height or weight on file to calculate BMI. Constitutional Well developed. Well nourished.  Vascular Foot warm and well perfused. Capillary refill normal to all digits.   No calf pain with palpation  Neurologic Normal speech. Oriented to person, place, and time. Epicritic sensation diminished to toes  Dermatologic Left hallux amputation site with eschar and no dehiscence or erythema present.  However there is edema and erythema and drainage with open ulceration of the dorsal aspect of the PIPJ of the second toe.  There is exposed bone present at the joint.  On the right foot there is dorsal ulceration on the right hallux with erythema and edema of the great toe.      Orthopedic: Status post hallux amputation left foot   Radiographs: 02/28/2023 XR left foot  with concern for erosion of the proximal phalanx head.  Pathology: Acute and chronic osteomyelitis, surgical margin is viable without necrosis or osteomyelitis  Micro: Moderate Staph aureus sensitive to oxacillin  Assessment:   Osteomyelitis of left second toe Ulceration of right hallux with cellulitis Status post left hallux amputation at MPJ level  Plan:  Patient was evaluated and treated and all questions answered.  1 month postop s/p left great toe amputation at MPJ level.  He is healing that site well -Progressing well in regards to left great toe amputation which appears to be healing without significant complication -However he has developed worsening ulceration of the left second toe now with exposed bone at the PIPJ.  Concern for osteomyelitis clinically and radiographically -Also with concern for chronic ulceration of the right hallux with erythema and cellulitis present.  Will need MRI to eval for osteomyelitis right great toe. -Discussed with patient and his spouse I recommend admission and we will proceed with direct admission for possible plan for surgery tomorrow pending imaging to include MRI of the right foot without contrast.  No MRI needed for left foot.  -XR: Left foot with osteomyelitis of second toe -WB Status: Weightbearing as tolerated in postop shoes -Sutures: Will remove during admission to the left hallux site. -Medications/ABX: Okay to hold antibiotics preoperatively -Foot redressed with Betadine dressings leave intact until surgery -N.p.o. past midnight  tonight for OR tomorrow morning to include left second toe amputation and possible right hallux amputation pending MRI results. -Discussed with hospitalist who is in agreement to proceed with direct admission.  Hopeful that bed available tonight so that patient can get surgery tomorrow        Corinna Gab, DPM [facility] / [facility]

## 2023-02-28 NOTE — Progress Notes (Signed)
Plan of Care Note for direct admission   Patient: [name] Mullarkey Rockwood Jr. MRN: [id number]   DOA: (Not on file)  Provider requesting admission: Standiford, Jenelle Mages, DPM  Reason for admission: Osteomyelitis Course:  Patient with history of hyperlipidemia, hypertension, diabetes, CKD 3, CVA, MS, amyloidosis, chronic pain, depression.  Patient underwent left great toe amputation last month.  Notably had DKA at that time.  Subsequently was lost to follow-up.  Followed up with podiatry today with previous amputation site well-healing, however left second toe had new wound that probes to bone with x-ray suspicious for osteomyelitis.  Also concern of left great toe abnormality which will require MRI to further evaluate.  Direct admission requested for osteomyelitis of left second toe, need to evaluate right great toe, need for amputation.  Patient is stable, can hold off on antibiotics until after surgical intervention at this time.  Notify podiatry when patient arrives.  Plan of care: The patient is accepted for admission to Telemetry unit, at [facility]..   Author: Synetta Fail, MD 02/28/2023  Check [email or web address] for on-call coverage.  Nursing staff, Please call [facility] Admits & Consults System-Wide number on Amion as soon as patient's arrival, so appropriate admitting provider can evaluate the pt.

## 2023-02-28 NOTE — H&P (Signed)
History and Physical   [name] M Umbaugh Jr. RUE:[id number] DOB: [birth year] DOA: 02/28/2023  PCP: Eloisa Northern, MD   Patient coming from: Home /podiatry  Chief Complaint: Osteomyelitis  HPI: [name]. is a 78 y.o. male with medical history significant of hypertension, hyperlipidemia, CKD 3A, CVA, CAD status post stent, MS, amyloidosis, and neuropathy, depression, chronic pain, osteomyelitis presenting with recurrent osteomyelitis.  Patient underwent left great toe amputation last month in the setting of osteomyelitis.  He did not follow-up initially after discharge until earlier today.  The amputation site was healing okay but had a new wound at the left second toe which probed to bone and x-ray in the office concerning for osteomyelitis.  Also concerning movement of the right great toe.  Patient denies fevers, chills, chest pain, shortness breath, abdominal pain, constipation, diarrhea, nausea, vomiting.  Vital signs yet to be obtained.  Lab workup pending.  Plan for MRI of the right toe per podiatry recommendations.  Review of Systems: As per HPI otherwise all other systems reviewed and are negative.  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   IMO SNOMED Dx Update Oct 2024     Charcot's arthropathy, diabetic (HCC)    left foot   Chronic pain syndrome followed by dr mark phillps at pain clinic   secondary to MS and diabetic neuropathy   Chronic periodontal disease    CKD (chronic kidney disease), stage II    hypertensive per PCP note   Coronary artery disease hx MI  s/p  DES to [facility] 2005   hx cardiologist-- dr Riley Kill until he retired,  now followed by pcp , dr Jacky Kindle   Depression    Diabetic neuropathy [facility])    Essential tremor    Fecal impaction (HCC) 03/31/2017   Gait disorder    uses walker   History of CVA (cerebrovascular accident)    2011-   right subcortical cva--  residual left mininmal side weakness and no use of left head   History of CVA with residual deficit    2011  --  right subcortical cva w/ mininmal left hemiparesis   History of diabetic ulcer of foot    2010  left foot   History of MI (myocardial infarction)    2005   Hypertension    dr Jacky Kindle   MS (multiple sclerosis) [facility])    dx 1985   Osteomyelitis of great toe of left foot (HCC) 01/30/2023   PONV (postoperative nausea and vomiting)    S/P drug eluting coronary stent placement    2005  to midRCA   Type 1 diabetes mellitus on insulin therapy [facility])     Past Surgical History:  Procedure Laterality Date   AMPUTATION TOE Left 01/30/2023   Procedure: AMPUTATION TOE;  Surgeon: Pilar Plate, DPM;  Location: [facility] OR;  Service: Orthopedics/Podiatry;  Laterality: Left;  Amputation of left great toe   CATARACT EXTRACTION W/ INTRAOCULAR LENS  IMPLANT, BILATERAL  Nov 2016   CORONARY ANGIOPLASTY WITH STENT PLACEMENT  10-18-2003  dr Riley Kill   DES x1  to  [facility] (high-grade lesion), diffuse diabetic abnormalities throughout the entire coronary tree w/ diffuse calcification and segmental plaquing throughout but noncritical disease   HARDWARE REMOVAL Left 04/20/2012   Procedure: Left foot removal deep retained screw;  Surgeon: Nadara Mustard, MD;  Location: [facility] OR;  Service: Orthopedics;  Laterality: Left;   MULTIPLE EXTRACTIONS WITH ALVEOLOPLASTY N/A 05/15/2015   Procedure: MULTIPLE EXTRACTIONS;  Surgeon: Filbert Berthold, DDS;  Location: [facility];  Service: Dentistry;  Laterality: N/A;   ORIF ANKLE FRACTURE  12/30/2011   Procedure: OPEN REDUCTION INTERNAL FIXATION (ORIF) ANKLE FRACTURE;  Surgeon: Nadara Mustard, MD;  Location: [facility] OR;  Service: Orthopedics;  Laterality: Left;  Excision Bone, Abscess Left Charcot Foot, Place Antibiotic Beads, Fixation Charcot   REMOVAL SUBMANDIDULAR GLAND, BILATERAL  1970's   benign    Social History  reports that he quit  smoking about 11 years ago. His smoking use included cigars and cigarettes. He started smoking about 37 years ago. He has a 13 pack-year smoking history. He has never used smokeless tobacco. He reports that he does not drink alcohol and does not use drugs.  No Known Allergies  Family History  Problem Relation Age of Onset   Tremor Mother    Heart disease Father    Celiac disease Brother    Cancer - Prostate Paternal Grandfather   Reviewed on admission  Prior to Admission medications   Medication Sig Start Date End Date Taking? Authorizing Provider  amLODipine (NORVASC) 5 MG tablet Take 5 mg by mouth every morning.     [provider]  aspirin 81 MG tablet Take 81 mg by mouth daily.    [provider]  atorvastatin (LIPITOR) 10 MG tablet Take 10 mg by mouth at bedtime.    [provider]  clopidogrel (PLAVIX) 75 MG tablet Take 75 mg by mouth daily.    [provider]  DULoxetine (CYMBALTA) 60 MG capsule Take 60 mg by mouth 2 (two) times daily.    [provider]  finasteride (PROSCAR) 5 MG tablet Take 5 mg by mouth every morning.     [provider]  insulin glargine (LANTUS) 100 UNIT/ML injection Inject 0.25 mLs (25 Units total) into the skin daily. 02/13/23   Amin, Ankit C, MD  Insulin Regular Human (HUMULIN R U-500 KWIKPEN [place]) Inject 1 application  into the skin See admin instructions. Inject as per sliding scale: 11-150= 4 units 151-200= 8 units 201-250=10 units 251-300=12 units ; 301-350=14 units 351-400=16 units Higher than 400 call MD    [provider]  metoprolol tartrate (LOPRESSOR) 25 MG tablet Take 25 mg by mouth 2 (two) times daily.    [provider]  nystatin cream-hydrocortisone cream-zinc oxide Apply topically 3 (three) times daily. 10/14/22   Marguerita Merles Latif, DO  oxyCODONE (ROXICODONE) 15 MG immediate release tablet Take 1 tablet (15 mg total) by mouth every 6 (six) hours as needed for pain.  02/10/23   Miguel Rota, MD  Probiotic Product (PROBIOTIC DAILY PO) Take 1 capsule by mouth in the morning, at noon, and at bedtime.    [provider]  Tapentadol HCl (NUCYNTA ER) 200 MG TB12 Take 200 mg by mouth in the morning and at bedtime.    [provider]  traZODone (DESYREL) 150 MG tablet Take 150 mg by mouth at bedtime.    [provider]    Physical Exam: There were no vitals filed for this visit.  Physical Exam Constitutional:      General: He is not in acute distress.    Appearance: Normal appearance.  HENT:     Head: Normocephalic and atraumatic.     Mouth/Throat:     Mouth: Mucous membranes are moist.     Pharynx: Oropharynx is clear.  Eyes:     Extraocular Movements: Extraocular movements intact.     Pupils: Pupils are equal, round, and reactive to light.  Cardiovascular:     Rate and Rhythm: Normal rate and regular rhythm.     Pulses: Normal pulses.     Heart sounds: Normal heart sounds.  Pulmonary:     Effort: Pulmonary effort is normal. No respiratory distress.     Breath sounds: Normal breath sounds.  Abdominal:     General: Bowel sounds are normal. There is no distension.     Palpations: Abdomen is soft.     Tenderness: There is no abdominal tenderness.  Musculoskeletal:        General: No swelling or deformity.     Comments: Status post left great toe amputation.  Left second toe wound.  Right great toe wound.  Skin:    General: Skin is warm and dry.  Neurological:     General: No focal deficit present.     Mental Status: Mental status is at baseline.    Labs on Admission: I have personally reviewed following labs and imaging studies  CBC: No results for input(s): "WBC", "NEUTROABS", "HGB", "HCT", "MCV", "PLT" in the last 168 hours.  Basic Metabolic Panel: No results for input(s): "NA", "K", "CL", "CO2", "GLUCOSE", "BUN", "CREATININE", "CALCIUM", "MG", "PHOS" in the last 168 hours.  GFR: CrCl cannot be calculated  (Unknown ideal weight.).  Liver Function Tests: No results for input(s): "AST", "ALT", "ALKPHOS", "BILITOT", "PROT", "ALBUMIN" in the last 168 hours.  Urine analysis:    Component Value Date/Time   COLORURINE YELLOW 02/08/2023 0154   APPEARANCEUR CLEAR 02/08/2023 0154   LABSPEC 1.018 02/08/2023 0154   PHURINE 5.0 02/08/2023 0154   GLUCOSEU >=500 (A) 02/08/2023 0154   HGBUR NEGATIVE 02/08/2023 0154   BILIRUBINUR NEGATIVE 02/08/2023 0154   KETONESUR 20 (A) 02/08/2023 0154   PROTEINUR 30 (A) 02/08/2023 0154   NITRITE NEGATIVE 02/08/2023 0154   LEUKOCYTESUR NEGATIVE 02/08/2023 0154    Radiological Exams on Admission: DG Foot Complete Left Result Date: 02/28/2023 Please see detailed radiograph report in office note.  EKG: Not performed  Assessment/Plan Principal Problem:   Osteomyelitis (HCC) Active Problems:   Type 1 diabetes mellitus with complications (HCC)   Pure hypercholesterolemia   Essential hypertension   Multiple sclerosis (HCC)   Chronic pain syndrome   Chronic kidney disease, stage 3a (HCC)   Neuropathy associated with monoclonal protein (HCC)   Coronary artery disease involving native coronary artery   Depression   History of CVA (cerebrovascular accident)   Status post amputation of left great toe (HCC)   Osteomyelitis > Recent amputation of left great toe for osteomyelitis, initially lost to follow-up but then followed up with 1/21 with healing of this amputation site however new osteomyelitis at the left second toe and suspicion for right great toe. > Evaluated podiatry office, left second toe wound probes to bone and x-ray suspicious for osteomyelitis.  Right great toe concerning and will need MRI to further clarify. > Directly admitted from podiatry, will notify them of patient arrival. > Can hold off on antibiotics for now as patient is hemodynamically stable, will consider starting antibiotics pending patient course prior to surgical intervention. >  Podiatry okay with continuing home DAPT  - Monitor on telemetry for now - Appreciate podiatry recommendations and assistance - Trend fever curve and WBC - ESR, CRP - MRI right foot - Supportive care  Hypertension - Continue home amlodipine and metoprolol  Hyperlipidemia -  Continue home atorvastatin  Diabetes > 20U qAM and 10U qPM and SSI at home. - 10 units daily - SSI   CKD 3A - Trend renal function and electrolytes  History of CVA - Continue aspirin and Plavix  - Continue home atorvastatin  CAD > History of stent in 2005. - Continue aspirin, Plavix - Continue home metoprolol, atorvastatin  Depression - Continue home duloxetine and trazodone  Chronic Pain - Continue home Oxycodone  History of MS > Asymptomatic.  Not on any specific therapies. - Noted  History of amyloidosis > Saw oncology/hematology in 2017.  Final workup negative for amyloid despite initial suspicion.  Patient reported at that time he would not want to receive treatment for it even if it was positive. - Noted  DVT prophylaxis: SCDs Code Status:   DNR/DNI Family Communication:  None on admission  Disposition Plan:   Patient is from:  Home/podiatry office  Anticipated DC to:  Home  Anticipated DC date:  2 to 3 days  Anticipated DC barriers: None  Consults called:  Podiatry Admission status:  Inpatient, telemetry  Severity of Illness: The appropriate patient status for this patient is INPATIENT. Inpatient status is judged to be reasonable and necessary in order to provide the required intensity of service to ensure the patient's safety. The patient's presenting symptoms, physical exam findings, and initial radiographic and laboratory data in the context of their chronic comorbidities is felt to place them at high risk for further clinical deterioration. Furthermore, it is not anticipated that the patient will be medically stable for discharge from the hospital within 2 midnights of admission.    * I certify that at the point of admission it is my clinical judgment that the patient will require inpatient hospital care spanning beyond 2 midnights from the point of admission due to high intensity of service, high risk for further deterioration and high frequency of surveillance required.Synetta Fail MD [facility]  How to contact the [facility] Attending or Consulting provider 7A - 7P or covering provider during after hours 7P -7A, for this patient?   Check the care team in [facility] and look for a) attending/consulting [facility] provider listed and b) the [facility] team listed Log into [email or web address] and use [facility]'s universal password to access. If you do not have the password, please contact the hospital operator. Locate the [facility] provider you are looking for under [facility] and page to a number that you can be directly reached. If you still have difficulty reaching the provider, please page the [facility] (Director on Call) for the Hospitalists listed on amion for assistance.  02/28/2023, 5:41 PM

## 2023-03-01 ENCOUNTER — Inpatient Hospital Stay (HOSPITAL_COMMUNITY): Admit: 2023-03-01 | Payer: Medicare Other | Admitting: Podiatry

## 2023-03-01 ENCOUNTER — Encounter (HOSPITAL_COMMUNITY)
Admission: AD | Disposition: A | Discharge status: Home Health | Payer: Self-pay | Source: Home / Self Care | Attending: Internal Medicine

## 2023-03-01 ENCOUNTER — Encounter (HOSPITAL_COMMUNITY): Payer: Self-pay | Admitting: Internal Medicine

## 2023-03-01 ENCOUNTER — Inpatient Hospital Stay (HOSPITAL_COMMUNITY): Payer: Medicare Other | Admitting: Anesthesiology

## 2023-03-01 ENCOUNTER — Inpatient Hospital Stay (HOSPITAL_COMMUNITY): Payer: Medicare Other

## 2023-03-01 ENCOUNTER — Other Ambulatory Visit: Payer: Self-pay

## 2023-03-01 DIAGNOSIS — I2510 Atherosclerotic heart disease of native coronary artery without angina pectoris: Secondary | ICD-10-CM

## 2023-03-01 DIAGNOSIS — E1069 Type 1 diabetes mellitus with other specified complication: Secondary | ICD-10-CM | POA: Diagnosis not present

## 2023-03-01 DIAGNOSIS — D472 Monoclonal gammopathy: Secondary | ICD-10-CM

## 2023-03-01 DIAGNOSIS — M869 Osteomyelitis, unspecified: Secondary | ICD-10-CM

## 2023-03-01 DIAGNOSIS — M86172 Other acute osteomyelitis, left ankle and foot: Secondary | ICD-10-CM

## 2023-03-01 DIAGNOSIS — G35 Multiple sclerosis: Secondary | ICD-10-CM

## 2023-03-01 DIAGNOSIS — Z87891 Personal history of nicotine dependence: Secondary | ICD-10-CM | POA: Diagnosis not present

## 2023-03-01 DIAGNOSIS — G63 Polyneuropathy in diseases classified elsewhere: Secondary | ICD-10-CM

## 2023-03-01 DIAGNOSIS — M86179 Other acute osteomyelitis, unspecified ankle and foot: Secondary | ICD-10-CM

## 2023-03-01 HISTORY — PX: AMPUTATION TOE: SHX6595

## 2023-03-01 HISTORY — PX: AMPUTATION: SHX166

## 2023-03-01 LAB — COMPREHENSIVE METABOLIC PANEL
ALT: 11 U/L (ref 0–44)
AST: 16 U/L (ref 15–41)
Albumin: 1.7 g/dL — ABNORMAL LOW (ref 3.5–5.0)
Alkaline Phosphatase: 165 U/L — ABNORMAL HIGH (ref 38–126)
Anion gap: 6 (ref 5–15)
BUN: 27 mg/dL — ABNORMAL HIGH (ref 8–23)
CO2: 25 mmol/L (ref 22–32)
Calcium: 7.4 mg/dL — ABNORMAL LOW (ref 8.9–10.3)
Chloride: 100 mmol/L (ref 98–111)
Creatinine, Ser: 1.58 mg/dL — ABNORMAL HIGH (ref 0.61–1.24)
GFR, Estimated: 45 mL/min — ABNORMAL LOW (ref >60)
Glucose, Bld: 234 mg/dL — ABNORMAL HIGH (ref 70–99)
Potassium: 5.3 mmol/L — ABNORMAL HIGH (ref 3.5–5.1)
Sodium: 131 mmol/L — ABNORMAL LOW (ref 135–145)
Total Bilirubin: 1 mg/dL (ref 0.0–1.2)
Total Protein: 5.4 g/dL — ABNORMAL LOW (ref 6.5–8.1)

## 2023-03-01 LAB — GLUCOSE, CAPILLARY
Glucose-Capillary: 221 mg/dL — ABNORMAL HIGH (ref 70–99)
Glucose-Capillary: 214 mg/dL — ABNORMAL HIGH (ref 70–99)
Glucose-Capillary: 178 mg/dL — ABNORMAL HIGH (ref 70–99)
Glucose-Capillary: 188 mg/dL — ABNORMAL HIGH (ref 70–99)
Glucose-Capillary: 320 mg/dL — ABNORMAL HIGH (ref 70–99)

## 2023-03-01 LAB — CBC
HCT: 38.1 % — ABNORMAL LOW (ref 39.0–52.0)
Hemoglobin: 12.4 g/dL — ABNORMAL LOW (ref 13.0–17.0)
MCH: 34.2 pg — ABNORMAL HIGH (ref 26.0–34.0)
MCHC: 32.5 g/dL (ref 30.0–36.0)
MCV: 105 fL — ABNORMAL HIGH (ref 80.0–100.0)
Platelets: 128 10*3/uL — ABNORMAL LOW (ref 150–400)
RBC: 3.63 MIL/uL — ABNORMAL LOW (ref 4.22–5.81)
RDW: 15 % (ref 11.5–15.5)
WBC: 5.6 10*3/uL (ref 4.0–10.5)
nRBC: 0 % (ref 0.0–0.2)

## 2023-03-01 SURGERY — AMPUTATION, TOE
Anesthesia: Monitor Anesthesia Care | Site: Toe | Laterality: Right

## 2023-03-01 MED ORDER — AMOXICILLIN-POT CLAVULANATE 875-125 MG PO TABS
1.0000 | ORAL_TABLET | Freq: Two times a day (BID) | ORAL | Status: DC
  Administered 2023-03-01 – 2023-03-03 (×4): 1 via ORAL
  Filled 2023-03-01 (×4): qty 1

## 2023-03-01 MED ORDER — ACETAMINOPHEN 160 MG/5ML PO SOLN: 325.0000 mg | ORAL | Status: DC | PRN

## 2023-03-01 MED ORDER — ALBUMIN HUMAN 5 % IV SOLN: INTRAVENOUS | Status: DC | PRN

## 2023-03-01 MED ORDER — SODIUM CHLORIDE 0.9 % IV SOLN: 2.0000 g | Freq: Three times a day (TID) | INTRAVENOUS | Status: DC

## 2023-03-01 MED ORDER — VANCOMYCIN HCL 1750 MG/350ML IV SOLN
1750.0000 mg | Freq: Once | INTRAVENOUS | Status: DC
Start: 2023-03-01 — End: 2023-03-01
  Filled 2023-03-01: qty 350

## 2023-03-01 MED ORDER — DROPERIDOL 2.5 MG/ML IJ SOLN: 0.6250 mg | Freq: Once | INTRAMUSCULAR | Status: DC | PRN

## 2023-03-01 MED ORDER — PROPOFOL 10 MG/ML IV BOLUS
INTRAVENOUS | Status: DC | PRN
  Administered 2023-03-01: 20 mg via INTRAVENOUS

## 2023-03-01 MED ORDER — EPHEDRINE SULFATE (PRESSORS) 50 MG/ML IJ SOLN
INTRAMUSCULAR | Status: DC | PRN
  Administered 2023-03-01: 15 mg via INTRAVENOUS
  Administered 2023-03-01: 10 mg via INTRAVENOUS

## 2023-03-01 MED ORDER — BUPIVACAINE HCL (PF) 0.5 % IJ SOLN
INTRAMUSCULAR | Status: AC
  Filled 2023-03-01: qty 30

## 2023-03-01 MED ORDER — TAPENTADOL HCL ER 100 MG PO TB12
200.0000 mg | ORAL_TABLET | Freq: Two times a day (BID) | ORAL | Status: DC
  Administered 2023-03-01 – 2023-03-03 (×5): 200 mg via ORAL
  Filled 2023-03-01 (×5): qty 2

## 2023-03-01 MED ORDER — 0.9 % SODIUM CHLORIDE (POUR BTL) OPTIME
TOPICAL | Status: DC | PRN
  Administered 2023-03-01: 1000 mL

## 2023-03-01 MED ORDER — EPHEDRINE 5 MG/ML INJ
INTRAVENOUS | Status: AC
  Filled 2023-03-01: qty 5

## 2023-03-01 MED ORDER — LACTATED RINGERS IV SOLN
INTRAVENOUS | Status: DC
Start: 2023-03-01 — End: 2023-03-01

## 2023-03-01 MED ORDER — INSULIN ASPART 100 UNIT/ML IJ SOLN
0.0000 [IU] | INTRAMUSCULAR | Status: DC | PRN
  Administered 2023-03-01: 2 [IU] via SUBCUTANEOUS
  Filled 2023-03-01: qty 1

## 2023-03-01 MED ORDER — FENTANYL CITRATE (PF) 100 MCG/2ML IJ SOLN: 25.0000 ug | INTRAMUSCULAR | Status: DC | PRN

## 2023-03-01 MED ORDER — SODIUM ZIRCONIUM CYCLOSILICATE 10 G PO PACK
10.0000 g | PACK | Freq: Once | ORAL | Status: AC
  Administered 2023-03-01: 10 g via ORAL
  Filled 2023-03-01: qty 1

## 2023-03-01 MED ORDER — PHENYLEPHRINE 80 MCG/ML (10ML) SYRINGE FOR IV PUSH (FOR BLOOD PRESSURE SUPPORT)
PREFILLED_SYRINGE | INTRAVENOUS | Status: DC | PRN
  Administered 2023-03-01 (×2): 160 ug via INTRAVENOUS

## 2023-03-01 MED ORDER — CHLORHEXIDINE GLUCONATE 0.12 % MT SOLN
15.0000 mL | Freq: Once | OROMUCOSAL | Status: AC
  Administered 2023-03-01: 15 mL via OROMUCOSAL
  Filled 2023-03-01: qty 15

## 2023-03-01 MED ORDER — BUPIVACAINE HCL (PF) 0.5 % IJ SOLN
INTRAMUSCULAR | Status: DC | PRN
  Administered 2023-03-01: 10 mL

## 2023-03-01 MED ORDER — ACETAMINOPHEN 10 MG/ML IV SOLN
1000.0000 mg | Freq: Once | INTRAVENOUS | Status: DC | PRN
Start: 2023-03-01 — End: 2023-03-01

## 2023-03-01 MED ORDER — OXYCODONE HCL 5 MG PO TABS: 5.0000 mg | ORAL_TABLET | Freq: Once | ORAL | Status: DC | PRN

## 2023-03-01 MED ORDER — ACETAMINOPHEN 325 MG PO TABS
325.0000 mg | ORAL_TABLET | ORAL | Status: DC | PRN
Start: 2023-03-01 — End: 2023-03-01

## 2023-03-01 MED ORDER — VASOPRESSIN 20 UNIT/ML IV SOLN
INTRAVENOUS | Status: AC
  Filled 2023-03-01: qty 1

## 2023-03-01 MED ORDER — OXYCODONE HCL 5 MG/5ML PO SOLN: 5.0000 mg | Freq: Once | ORAL | Status: DC | PRN

## 2023-03-01 MED ORDER — METRONIDAZOLE 500 MG/100ML IV SOLN
500.0000 mg | Freq: Two times a day (BID) | INTRAVENOUS | Status: DC
  Administered 2023-03-01: 500 mg via INTRAVENOUS
  Filled 2023-03-01: qty 100

## 2023-03-01 MED ORDER — VASOPRESSIN 20 UNIT/ML IV SOLN
INTRAVENOUS | Status: DC | PRN
  Administered 2023-03-01: 1 [IU] via INTRAVENOUS

## 2023-03-01 MED ORDER — SODIUM CHLORIDE 0.9 % IV SOLN
2.0000 g | Freq: Two times a day (BID) | INTRAVENOUS | Status: DC
  Administered 2023-03-01: 2 g via INTRAVENOUS
  Filled 2023-03-01: qty 12.5

## 2023-03-01 MED ORDER — ORAL CARE MOUTH RINSE
15.0000 mL | Freq: Once | OROMUCOSAL | Status: AC
Start: 2023-03-01 — End: 2023-03-01

## 2023-03-01 MED ORDER — PROPOFOL 500 MG/50ML IV EMUL
INTRAVENOUS | Status: DC | PRN
  Administered 2023-03-01: 50 ug/kg/min via INTRAVENOUS

## 2023-03-01 SURGICAL SUPPLY — 39 items
BLADE OSC/SAGITTAL MD 5.5X18 (BLADE) ×2 IMPLANT
BLADE SURG 10 STRL SS (BLADE) ×2 IMPLANT
BLADE SURG 15 STRL LF DISP TIS (BLADE) ×4 IMPLANT
BNDG COHESIVE 3X5 TAN ST LF (GAUZE/BANDAGES/DRESSINGS) ×2 IMPLANT
BNDG ELASTIC 3INX 5YD STR LF (GAUZE/BANDAGES/DRESSINGS) ×2 IMPLANT
BNDG ELASTIC 4INX 5YD STR LF (GAUZE/BANDAGES/DRESSINGS) IMPLANT
BNDG ESMARK 4X9 LF (GAUZE/BANDAGES/DRESSINGS) ×2 IMPLANT
BNDG GAUZE DERMACEA FLUFF 4 (GAUZE/BANDAGES/DRESSINGS) IMPLANT
CHLORAPREP W/TINT 26 (MISCELLANEOUS) ×2 IMPLANT
DRAPE BILATERAL LIMB T (DRAPES) IMPLANT
DRSG EMULSION OIL 3X3 NADH (GAUZE/BANDAGES/DRESSINGS) IMPLANT
DRSG XEROFORM 1X8 (GAUZE/BANDAGES/DRESSINGS) IMPLANT
ELECT REM PT RETURN 9FT ADLT (ELECTROSURGICAL) ×2
ELECTRODE REM PT RTRN 9FT ADLT (ELECTROSURGICAL) ×2 IMPLANT
GAUZE PAD ABD 8X10 STRL (GAUZE/BANDAGES/DRESSINGS) IMPLANT
GAUZE SPONGE 2X2 STRL 8-PLY (GAUZE/BANDAGES/DRESSINGS) ×4 IMPLANT
GAUZE SPONGE 4X4 12PLY STRL (GAUZE/BANDAGES/DRESSINGS) ×2 IMPLANT
GAUZE STRETCH 2X75IN STRL (MISCELLANEOUS) ×2 IMPLANT
GAUZE XEROFORM 1X8 LF (GAUZE/BANDAGES/DRESSINGS) ×2 IMPLANT
GLOVE BIO SURGEON STRL SZ7.5 (GLOVE) ×2 IMPLANT
GLOVE BIOGEL PI IND STRL 7.5 (GLOVE) ×2 IMPLANT
GOWN STRL REUS W/ TWL LRG LVL3 (GOWN DISPOSABLE) ×4 IMPLANT
KIT BASIN OR (CUSTOM PROCEDURE TRAY) ×2 IMPLANT
NDL BIOPSY JAMSHIDI 8X6 (NEEDLE) IMPLANT
NDL HYPO 25X1 1.5 SAFETY (NEEDLE) ×4 IMPLANT
NEEDLE BIOPSY JAMSHIDI 8X6 (NEEDLE) IMPLANT
NEEDLE HYPO 25X1 1.5 SAFETY (NEEDLE) ×2 IMPLANT
NS IRRIG 1000ML POUR BTL (IV SOLUTION) IMPLANT
PACK ORTHO EXTREMITY (CUSTOM PROCEDURE TRAY) ×2 IMPLANT
PADDING CAST ABS COTTON 4X4 ST (CAST SUPPLIES) ×4 IMPLANT
SPIKE FLUID TRANSFER (MISCELLANEOUS) ×4 IMPLANT
STOCKINETTE 4X48 STRL (DRAPES) ×2 IMPLANT
STRIP CLOSURE SKIN 1/2X4 (GAUZE/BANDAGES/DRESSINGS) IMPLANT
SUT ETHILON 3 0 FSLX (SUTURE) ×2 IMPLANT
SUT PROLENE 3 0 PS 2 (SUTURE) IMPLANT
SUT PROLENE 4 0 PS 2 18 (SUTURE) ×4 IMPLANT
SYR CONTROL 10ML LL (SYRINGE) ×4 IMPLANT
UNDERPAD 30X36 HEAVY ABSORB (UNDERPADS AND DIAPERS) ×2 IMPLANT
WATER STERILE IRR 1000ML POUR (IV SOLUTION) ×2 IMPLANT

## 2023-03-01 NOTE — Progress Notes (Signed)
PROGRESS NOTE  [name] M Donathan Jr. ZHY:[id number] DOB: 05-16-1945   PCP: Eloisa Northern, MD  Patient is from: Home.  DOA: 02/28/2023 LOS: 1  Chief complaints No chief complaint on file.    Brief Narrative / Interim history: 78 year old M with PMH of CAD s/p stent in 2005, MS, CVA, amyloidosis, neuropathy, HTN, HLD, chronic pain, depression and recurrent osteomyelitis sent to ED from podiatry office due to concern for left second toe osteomyelitis.  Patient had left great toe amputation for stomatitis last month.  He was seen at podiatry office and noted to have new wound on his left second toe which was probed to bone.  X-ray was concerning for osteomyelitis.  There was also concern about right great toe.  MRI of right foot ordered and confirmed osteomyelitis.  He was sent to ED for further evaluation and management.  Patient underwent right foot amputation of hallux at MPJ level and left foot amputation of second toe at the proximal phalanx base by Dr. Annamary Rummage on 03/01/2023.  Also started on broad-spectrum antibiotics.  Podiatry recommends 7 days of Augmentin on discharge, WBAT in postop shoe bilaterally and outpatient follow-up on 1/30  Subjective: Seen and examined earlier this morning before he went for surgery.  No major events overnight of this morning.  Complaining of chronic generalized body pain.  He denies pain in his feet.  Objective: Vitals:   03/01/23 1053 03/01/23 1320 03/01/23 1330 03/01/23 1344  BP:  (!) 108/48 (!) 107/55 (!) 110/55  Pulse:  (!) 58 (!) 58 60  Resp:  14 11 12   Temp:  97.9 F (36.6 C)  98 F (36.7 C)  TempSrc:      SpO2: 96% 93% 92% 92%  Weight:      Height:        Examination:  GENERAL: No apparent distress.  Nontoxic. HEENT: MMM.  Vision and hearing grossly intact.  NECK: Supple.  No apparent JVD.  RESP:  No IWOB.  Fair aeration bilaterally. CVS:  RRR. Heart sounds normal.  ABD/GI/GU: BS+. Abd soft, NTND.  MSK/EXT:  Moves extremities.   Ulceration with surrounding erythema of right great toe and left second toe. S/p left great toe amputation.  Surgical site DCI.  2+ DP pulses bilaterally. SKIN: no apparent skin lesion or wound NEURO: Awake, alert and oriented appropriately.  No apparent focal neuro deficit. PSYCH: Calm. Normal affect.   Procedures:  03/01/2023 right foot amputation of hallux at MPJ level and left foot amputation of second toe at the proximal phalanx base by Dr. Annamary Rummage  Microbiology summarized: MRSA PCR screen nonreactive Blood cultures NGTD  Assessment and plan: Bilateral foot osteomyelitis involving left second toe and right great toe: S/p right foot amputation of hallux at MPJ level and left foot amputation of second toe at the proximal phalanx base by Dr. Annamary Rummage on 02/28/2022.  CRP 10.4.  ESR 43.  Blood cultures NGTD hemodynamically stable. -Continue broad-spectrum antibiotics for now -Appreciate podiatry recommendation-Augmentin for 7 days, WBAT in postop shoe and outpatient follow-up on 1/30 -PT/OT eval -Glycemic control   Essential hypertension: Normotensive for most part -Continue home amlodipine and metoprolol   Hyperlipidemia -Continue home atorvastatin   Uncontrolled IDDM-2 with neuropathy and hyperglycemia: A1c 6.5% on 1/2. Recent Labs  Lab 02/28/23 1816 02/28/23 2217 03/01/23 0847 03/01/23 1026 03/01/23 1339  GLUCAP 189* 236* 221* 214* 178*  -Continue current insulin regimen   CKD 3A: Stable -Monitor   History of CVA: Stable -Continue Lipitor, aspirin and Plavix  History of CAD s/p stenting 2005.  No cardiopulmonary symptoms. > History of stent in 2005. -Continue metoprolol, Lipitor aspirin, Plavix   Depression: Stable - Continue home duloxetine and trazodone   Chronic Pain: Stable -Continue home Nucynta and oxycodone.   History of MS colon asymptomatic.  Not on any specific therapies. -Noted   History of amyloidosis: Noted  Mild hypokalemia -Lokelma 10 g  x 1  Mild thrombocytopenia: Likely reactive -Monitor  Hyponatremia: Mild -Recheck in the morning  Body mass index is 26.64 kg/m.    DVT prophylaxis:  SCDs Start: 02/28/23 1739  Code Status: DNR/DNI Family Communication: None at bedside Level of care: Telemetry Surgical Status is: Inpatient Remains inpatient appropriate because: Bilateral foot osteomyelitis   Final disposition: Likely home Consultants:  Podiatry  55 minutes with more than 50% spent in reviewing records, counseling patient/family and coordinating care.   Sch Meds:  Scheduled Meds:  amLODipine  5 mg Oral q morning   aspirin EC  81 mg Oral Daily   atorvastatin  10 mg Oral QHS   clopidogrel  75 mg Oral Daily   DULoxetine  60 mg Oral BID   metoprolol tartrate  25 mg Oral BID   mupirocin ointment  1 Application Nasal BID   sodium chloride flush  3 mL Intravenous Q12H   sodium zirconium cyclosilicate  10 g Oral Once   tapentadol  200 mg Oral BID AC & HS   traZODone  150 mg Oral QHS   Continuous Infusions:  ceFEPime (MAXIPIME) IV 2 g (03/01/23 0933)   metronidazole 500 mg (03/01/23 0937)   vancomycin     PRN Meds:.acetaminophen **OR** acetaminophen, oxyCODONE, polyethylene glycol  Antimicrobials: Anti-infectives (From admission, onward)    Start     Dose/Rate Route Frequency Ordered Stop   03/01/23 1000  metroNIDAZOLE (FLAGYL) IVPB 500 mg        500 mg 100 mL/hr over 60 Minutes Intravenous Every 12 hours 03/01/23 0844     03/01/23 0930  vancomycin (VANCOREADY) IVPB 1750 mg/350 mL        1,750 mg 175 mL/hr over 120 Minutes Intravenous  Once 03/01/23 0854     03/01/23 0930  ceFEPIme (MAXIPIME) 2 g in sodium chloride 0.9 % 100 mL IVPB        2 g 200 mL/hr over 30 Minutes Intravenous Every 12 hours 03/01/23 0920     03/01/23 0900  ceFEPIme (MAXIPIME) 2 g in sodium chloride 0.9 % 100 mL IVPB  Status:  Discontinued        2 g 200 mL/hr over 30 Minutes Intravenous Every 8 hours 03/01/23 0854 03/01/23  0920        I have personally reviewed the following labs and images: CBC: Recent Labs  Lab 02/28/23 1825 03/01/23 0605  WBC 7.0 5.6  NEUTROABS 5.0  --   HGB 13.5 12.4*  HCT 41.4 38.1*  MCV 103.2* 105.0*  PLT 142* 128*   BMP &GFR Recent Labs  Lab 02/28/23 1825 03/01/23 0605  NA 133* 131*  K 5.0 5.3*  CL 98 100  CO2 26 25  GLUCOSE 204* 234*  BUN 28* 27*  CREATININE 1.92* 1.58*  CALCIUM 8.0* 7.4*   Estimated Creatinine Clearance: 44.3 mL/min (A) (by C-G formula based on SCr of 1.58 mg/dL (H)). Liver & Pancreas: Recent Labs  Lab 02/28/23 1825 03/01/23 0605  AST 22 16  ALT 13 11  ALKPHOS 203* 165*  BILITOT 1.0 1.0  PROT 6.5 5.4*  ALBUMIN 2.1*  1.7*   No results for input(s): "LIPASE", "AMYLASE" in the last 168 hours. No results for input(s): "AMMONIA" in the last 168 hours. Diabetic: No results for input(s): "HGBA1C" in the last 72 hours. Recent Labs  Lab 02/28/23 1816 02/28/23 2217 03/01/23 0847 03/01/23 1026 03/01/23 1339  GLUCAP 189* 236* 221* 214* 178*   Cardiac Enzymes: No results for input(s): "CKTOTAL", "CKMB", "CKMBINDEX", "TROPONINI" in the last 168 hours. No results for input(s): "PROBNP" in the last 8760 hours. Coagulation Profile: No results for input(s): "INR", "PROTIME" in the last 168 hours. Thyroid Function Tests: No results for input(s): "TSH", "T4TOTAL", "FREET4", "T3FREE", "THYROIDAB" in the last 72 hours. Lipid Profile: No results for input(s): "CHOL", "HDL", "LDLCALC", "TRIG", "CHOLHDL", "LDLDIRECT" in the last 72 hours. Anemia Panel: No results for input(s): "VITAMINB12", "FOLATE", "FERRITIN", "TIBC", "IRON", "RETICCTPCT" in the last 72 hours. Urine analysis:    Component Value Date/Time   COLORURINE YELLOW 02/08/2023 0154   APPEARANCEUR CLEAR 02/08/2023 0154   LABSPEC 1.018 02/08/2023 0154   PHURINE 5.0 02/08/2023 0154   GLUCOSEU >=500 (A) 02/08/2023 0154   HGBUR NEGATIVE 02/08/2023 0154   BILIRUBINUR NEGATIVE 02/08/2023  0154   KETONESUR 20 (A) 02/08/2023 0154   PROTEINUR 30 (A) 02/08/2023 0154   NITRITE NEGATIVE 02/08/2023 0154   LEUKOCYTESUR NEGATIVE 02/08/2023 0154   Sepsis Labs: Invalid input(s): "PROCALCITONIN", "LACTICIDVEN"  Microbiology: Recent Results (from the past 240 hours)  Culture, blood (Routine X 2) w Reflex to ID Panel     Status: None (Preliminary result)   Collection Time: 02/28/23  6:25 PM   Specimen: BLOOD RIGHT ARM  Result Value Ref Range Status   Specimen Description BLOOD RIGHT ARM  Final   Special Requests   Final    BOTTLES DRAWN AEROBIC AND ANAEROBIC Blood Culture results may not be optimal due to an inadequate volume of blood received in culture bottles   Culture   Final    NO GROWTH < 24 HOURS Performed at [facility], [address] [place], [place] [address]    Report Status PENDING  Incomplete  Culture, blood (Routine X 2) w Reflex to ID Panel     Status: None (Preliminary result)   Collection Time: 02/28/23  6:32 PM   Specimen: BLOOD LEFT HAND  Result Value Ref Range Status   Specimen Description BLOOD LEFT HAND AEROBIC BOTTLE ONLY  Final   Special Requests   Final    BOTTLES DRAWN AEROBIC ONLY Blood Culture results may not be optimal due to an inadequate volume of blood received in culture bottles   Culture   Final    NO GROWTH < 24 HOURS Performed at [facility], [address], [place], [place] [address]    Report Status PENDING  Incomplete  Surgical PCR screen     Status: None   Collection Time: 02/28/23  9:33 PM   Specimen: Nasal Mucosa; Nasal Swab  Result Value Ref Range Status   MRSA, PCR NEGATIVE NEGATIVE Final   Staphylococcus aureus NEGATIVE NEGATIVE Final    Comment: (NOTE) The Xpert SA Assay (FDA approved for NASAL specimens in patients 29 years of age and older), is one component of a comprehensive surveillance program. It is not intended to diagnose infection nor to guide or monitor treatment. Performed at [facility]  Lab, [address] [place], [place] [address]     Radiology Studies: MR FOOT RIGHT W WO CONTRAST Result Date: 03/01/2023 CLINICAL DATA:  Right great toe wound.  Recent left great toe amputation. EXAM: MRI OF THE RIGHT FOREFOOT WITHOUT AND WITH CONTRAST TECHNIQUE: Multiplanar, multisequence MR imaging of the right forefoot was performed before and after the administration of intravenous contrast. CONTRAST:  9mL GADAVIST GADOBUTROL 1 MMOL/ML IV SOLN COMPARISON:  Right foot x-rays dated September 21, 2022. FINDINGS: Bones/Joint/Cartilage Marrow edema and enhancement with corresponding decreased T1 marrow signal involving the medial dorsal base of the first distal phalanx and medial head of the first proximal phalanx. Healing nondisplaced fracture of the first metatarsal head. Degenerative changes of the first MTP joint. No significant joint effusion. Ligaments Collateral ligaments are intact. Muscles and Tendons Flexor and extensor tendons are intact. No tenosynovitis. Complete fatty atrophy of the intrinsic foot muscles. Soft tissue Ulceration at the dorsal aspect of the first IP joint with surrounding soft tissue swelling and enhancement. No fluid collection. No soft tissue mass. IMPRESSION: 1. Ulceration at the dorsal aspect of the first IP joint with underlying osteomyelitis of the first distal and proximal phalanges. No abscess. 2. Healing nondisplaced fracture of the first metatarsal head. Electronically Signed   By: Obie Dredge M.D.   On: 03/01/2023 08:47      Venissa Nappi T. Jameriah Trotti [facility]  If 7PM-7AM, please contact night-coverage [email or web address] 03/01/2023, 3:11 PM

## 2023-03-01 NOTE — Transfer of Care (Signed)
Immediate Anesthesia Transfer of Care Note  Patient: Manav Sigel Narang Jr.  Procedure(s) Performed: AMPUTATION LEFT SECOND TOE (Left: Toe) AMPUTATION RIGHT GREAT TOE (Right: Toe)  Patient Location: PACU  Anesthesia Type:MAC  Level of Consciousness: drowsy  Airway & Oxygen Therapy: Patient Spontanous Breathing and Patient connected to face mask oxygen  Post-op Assessment: Report given to RN and Post -op Vital signs reviewed and stable  Post vital signs: Reviewed and stable  Last Vitals:  Vitals Value Taken Time  BP 110/55 03/01/23 1345  Temp 36.7 C 03/01/23 1344  Pulse 60 03/01/23 1344  Resp 12 03/01/23 1344  SpO2 92 % 03/01/23 1344  Vitals shown include unfiled device data.  Last Pain:  Vitals:   03/01/23 1330  TempSrc:   PainSc: 0-No pain      Patients Stated Pain Goal: 2 (03/01/23 1045)  Complications: No notable events documented.

## 2023-03-01 NOTE — Consult Note (Signed)
[facility] Liaison Consult Note   03/01/2023  Ausitn Many Seelye Jr. 12-24-1945 [id number]    Primary Care Provider:  Geoffry Paradise with [facility], is currently not in an affiliated provider with [employer] [VBCI]   Insurance: Medicare ACO REACH   The patient is not on the current member enrollment rosters for any of the [facility] risk contracted plans with an affiliated Primary Care Provider.       Reason:  Not a beneficiary currently attributed to one of the VBCI populations.   [name] primary care provider is not an in-network provider with VBCI   For additional questions or referrals please contact:    For questions, please call:  Charlesetta Shanks, RN, BSN, CCM [place]  [facility], [facility] Liaison Direct Dial: [phone] or secure chat Email: [email or web address]

## 2023-03-01 NOTE — Anesthesia Preprocedure Evaluation (Signed)
Anesthesia Evaluation  Patient identified by MRN, date of birth, ID band Patient awake    Reviewed: Allergy & Precautions, NPO status , [name] Chart, lab work & pertinent test results  History of Anesthesia Complications (+) PONV and history of anesthetic complications  Airway Mallampati: I  TM Distance: >3 FB Neck ROM: Full    Dental  (+) Edentulous Upper, Edentulous Lower   Pulmonary former smoker   breath sounds clear to auscultation       Cardiovascular hypertension, Pt. on medications and Pt. on home beta blockers + CAD and + Cardiac Stents   Rhythm:Regular Rate:Normal     Neuro/Psych  PSYCHIATRIC DISORDERS  Depression     Neuromuscular disease CVA    GI/Hepatic negative GI ROS, Neg liver ROS,,,  Endo/Other  diabetes, Type 1, Insulin Dependent    Renal/GU Renal disease     Musculoskeletal  (+) Arthritis ,    Abdominal   Peds  Hematology negative hematology ROS (+)   Anesthesia Other Findings   Reproductive/Obstetrics                             Anesthesia Physical Anesthesia Plan  ASA: 3  Anesthesia Plan: MAC   Post-op Pain Management:    Induction: Intravenous  PONV Risk Score and Plan: 3 and Ondansetron, Propofol infusion and Treatment may vary due to age or medical condition  Airway Management Planned: Natural Airway and Nasal Cannula  Additional Equipment: None  Intra-op Plan:   Post-operative Plan:   Informed Consent: I have reviewed the patients History and Physical, chart, labs and discussed the procedure including the risks, benefits and alternatives for the proposed anesthesia with the patient or authorized representative who has indicated his/her understanding and acceptance.   Patient has DNR.  Discussed DNR with patient and Suspend DNR.     Plan Discussed with: CRNA  Anesthesia Plan Comments: (Re: DNR, no compressions)       Anesthesia Quick  Evaluation

## 2023-03-01 NOTE — Progress Notes (Signed)
Orthopedic Tech Progress Note Patient Details:  [name] Chmura Vanorder Jr. [birth year] [id number]  Ortho Devices Type of Ortho Device: Postop shoe/boot Ortho Device/Splint Location: bi-lateral Ortho Device/Splint Interventions: Ordered, Application, Adjustment   Post Interventions Patient Tolerated: Well Instructions Provided: Care of device, Adjustment of device  [name] 03/01/2023, 6:11 PM

## 2023-03-01 NOTE — Op Note (Addendum)
Full Operative Report  Date of Operation: 1:15 PM, 03/01/2023   Patient: [name]. - 78 y.o. male  Surgeon: Pilar Plate, DPM   Assistant: None  Diagnosis: Osteomyelitis  Procedure:  1. Amputation of hallux at MPJ level, right foot  2. Amputation of second toe at proximal phalanx base level, left foot    Anesthesia: Monitor Anesthesia Care  [name], MD  Anesthesiologist: [name], MD CRNA: Camillia Herter, CRNA; Garfield Cornea, CRNA   Estimated Blood Loss: Minimal   Hemostasis: 1) Anatomical dissection, mechanical compression, electrocautery 2) No tourniquet was used during procedure  Implants: * No implants in log *  Materials: 3-0 prolene  Injectables: 1) Pre-operatively: 10 cc of  0.5% marcaine plain 2) Post-operatively: None   Specimens: Pathology: Right hallux and left 2nd toe  Microbiology: none   Antibiotics: IV antibiotics given per schedule on the floor  Drains: None  Complications: Patient tolerated the procedure well without complication.   Operative findings: As below in detailed report  Indications for Procedure: [name] M Teed Jr. presents to Pilar Plate, DPM with a chief complaint of chornic wound to the left 2nd toe and the right hallux with concern for underlying osteomyelitis. The patient has failed conservative treatments of various modalities. At this time the patient has elected to proceed with surgical correction. All alternatives, risks, and complications of the procedures were thoroughly explained to the patient. Patient exhibits appropriate understanding of all discussion points and informed consent was signed and obtained in the chart with no guarantees to surgical outcome given or implied.  Description of Procedure: Patient was brought to the operating room. Patient remained on their hospital bed in the supine position. A surgical timeout was performed and all members of the operating  room, the procedure, and the surgical site were identified. anesthesia occurred as per anesthesia record. Local anesthetic as previously described was then injected about the operative field in a local infiltrative block.  The operative lower extremity as noted above was then prepped and draped in the usual sterile manner. The following procedure then began.  RIGHT FOOT:  Attention was directed to the 1st digit on the right foot. A full-thickness incision encompassing the entire digit was made using a #15 blade. Dissection was carried down to bone. The toe was secured with a towel clamp, further dissected in its entirety, and disarticulated at the MPJ and passed to the back table as a gross specimen. This was then labled and sent to pathology. The bone was noted to be soft and eroded, and consistent with osteomyelitis. All remaining necrotic and devitalized soft tissue structures were visualized and dissected away using sharp and dull dissection. Care was taken to protect all neurovascular structures throughout the dissection. All bleeders were cauterized as necessary.   The area was then flushed with copious amounts of sterile saline. Then using the suture materials previously described, the site was closed in anatomic layers and the skin was well approximated under minimal tension.  LEFT FOOT: Attention was directed to the 2nd digit on the left foot. A full-thickness incision encompassing the entire digit was made using a #15 blade. Dissection was carried down to bone. The toe was secured with a towel clamp, further dissected in its entirety. Bone cutter was then used to cut the proximal phalanx near the base transversely. and passed to the back table as a gross specimen. This was then labled and sent to pathology. The bone was noted to be soft  and eroded, and consistent with osteomyelitis. All remaining necrotic and devitalized soft tissue structures were visualized and dissected away using sharp and dull  dissection. Care was taken to protect all neurovascular structures throughout the dissection. All bleeders were cauterized as necessary. . The area was then flushed with copious amounts of sterile saline. Then using the suture materials previously described, the site was closed in anatomic layers and the skin was well approximated under minimal tension.  The surgical site was then dressed with xerforom 4x4 kerlix and ace wrap. The patient tolerated both the procedure and anesthesia well with vital signs stable throughout. The patient was transferred in good condition and all vital signs stable  from the OR to recovery under the discretion of anesthesia.  Condition: Vital signs stable, neurovascular status unchanged from preoperative   Surgical plan:  Expect clean margin at both Right hallux amp site and left 2nd toe amp site. Dressing to remain intact until follow up. Post op shoe WBAT bilaterlally. Rec 7 days augmentin on discharge. Follow up 1/30 with me in [place] office.   The patient will be WBAT in a post op shoe to the operative limb until further instructed. The dressing is to remain clean, dry, and intact. Will continue to follow unless noted elsewhere.   Carlena Hurl, DPM [facility]

## 2023-03-01 NOTE — Consult Note (Signed)
PODIATRY CONSULTATION  NAME Crosley M Parcell Jr. MRN [id number] DOB 03-Mar-1945 DOA 02/28/2023   Reason for consult: Osteomyelitis of right great toe and left second toe  Attending/Consulting physician: Versie Starks MD  History of present illness:  "[name]. is a 78 y.o. male with medical history significant of hypertension, hyperlipidemia, CKD 3A, CVA, CAD status post stent, MS, amyloidosis, and neuropathy, depression, chronic pain, osteomyelitis presenting with recurrent osteomyelitis.   Patient underwent left great toe amputation last month in the setting of osteomyelitis.  He did not follow-up initially after discharge until earlier today.  The amputation site was healing okay but had a new wound at the left second toe which probed to bone and x-ray in the office concerning for osteomyelitis.  Also concerning movement of the right great toe."  Discussed MRI findings with the patient this morning.  Concern for osteomyelitis of right great toe and I do recommend amputation he is in agreement to proceed.  We have previously discussed left second toe amputation as well.  He was seen in the office by myself yesterday and sent for procedure  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   IMO SNOMED Dx Update Oct 2024     Charcot's arthropathy, diabetic (HCC)    left foot   Chronic pain syndrome followed by dr mark phillps at pain clinic   secondary to MS and diabetic neuropathy   Chronic periodontal disease    CKD (chronic kidney disease), stage II    hypertensive per PCP note   Coronary artery disease hx MI  s/p  DES to [facility] 2005   hx cardiologist-- dr Riley Kill until he retired,  now followed by pcp , dr Jacky Kindle   Depression    Diabetic neuropathy [facility])    Essential tremor    Fecal impaction (HCC) 03/31/2017   Gait disorder    uses walker   History of CVA  (cerebrovascular accident)    2011-  right subcortical cva--  residual left mininmal side weakness and no use of left head   History of CVA with residual deficit    2011  --  right subcortical cva w/ mininmal left hemiparesis   History of diabetic ulcer of foot    2010  left foot   History of MI (myocardial infarction)    2005   Hypertension    dr Jacky Kindle   MS (multiple sclerosis) [facility])    dx 1985   Osteomyelitis of great toe of left foot (HCC) 01/30/2023   PONV (postoperative nausea and vomiting)    S/P drug eluting coronary stent placement    2005  to midRCA   Type 1 diabetes mellitus on insulin therapy (HCC)        Latest Ref Rng & Units 03/01/2023    6:05 AM 02/28/2023    6:25 PM 02/13/2023    5:36 AM  CBC  WBC 4.0 - 10.5 K/uL 5.6  7.0  4.8   Hemoglobin 13.0 - 17.0 g/dL 64.4  03.4  74.2   Hematocrit 39.0 - 52.0 % 38.1  41.4  38.5   Platelets 150 - 400 K/uL 128  142  90        Latest Ref Rng & Units 03/01/2023    6:05 AM 02/28/2023    6:25 PM 02/13/2023    5:36 AM  BMP  Glucose 70 -  99 mg/dL 027  253  664   BUN 8 - 23 mg/dL 27  28  26    Creatinine 0.61 - 1.24 mg/dL 4.03  4.74  2.59   Sodium 135 - 145 mmol/L 131  133  133   Potassium 3.5 - 5.1 mmol/L 5.3  5.0  5.2   Chloride 98 - 111 mmol/L 100  98  98   CO2 22 - 32 mmol/L 25  26  28    Calcium 8.9 - 10.3 mg/dL 7.4  8.0  8.3       Physical Exam: Lower Extremity Exam Vasc: R - PT 1 out of 4 palpable, DP 1 out of 4 palpable.   L - PT 1 out of 4 palpable, DP 1 out of 4 palpable.    Derm: R -dorsal ulceration erythema and edema of the right hallux.  L -ulceration with exposed bone of the PIPJ of the left second toe.  Prior left hallux amputation site appears to be healing well without any dehiscence erythema or drainage  MSK:  R -edema noted of the right great toe  L -status post prior left hallux amputation which is healing with eschar present  Neuro: R - Gross sensation diminished. Gross motor function intact   L -  Gross sensation diminished. Gross motor function intact    ASSESSMENT/PLAN OF CARE 78 y.o. male with PMHx significant for  hypertension, hyperlipidemia, CKD 3A, CVA, CAD status post stent, MS, amyloidosis, and neuropathy, depression, chronic pain, osteomyelitis status post prior left great toe amputation with concern for ulceration and underlying osteomyelitis of the right hallux as well as the left second toe  WBC 5.6 MRI right foot: Osteomyelitis of distal proximal phalanx XR left foot from the office yesterday concern for erosions and osteomyelitis of the second PIPJ  -N.p.o. for OR today for right hallux amputation and left second toe amputation.  Discussed above with the patient and his wife via the phone.  They are in agreement to proceed - Continue IV abx broad spectrum pending further culture data - Anticoagulation: Okay to continue - Wound care: Leave surgical dressings clean dry and intact postop - WB status: Weightbearing as tolerated in postop shoe bilateral foot postoperatively - Will continue to follow   Thank you for the consult.  Please contact me directly with any questions or concerns.           Corinna Gab, DPM [facility] / [facility]    [address] [place], [place] [address]                Office [phone]  Fax [phone]

## 2023-03-01 NOTE — Anesthesia Procedure Notes (Signed)
Procedure Name: MAC Date/Time: 03/01/2023 12:27 PM  Performed by: Camillia Herter, CRNAPre-anesthesia Checklist: Patient identified, Emergency Drugs available, Suction available and Patient being monitored Patient Re-evaluated:Patient Re-evaluated prior to induction Oxygen Delivery Method: Simple face mask Dental Injury: Teeth and Oropharynx as per pre-operative assessment

## 2023-03-01 NOTE — Inpatient Diabetes Management (Signed)
Inpatient Diabetes Program Recommendations  AACE/ADA: New Consensus Statement on Inpatient Glycemic Control (2015)  Target Ranges:  Prepandial:   less than 140 mg/dL      Peak postprandial:   less than 180 mg/dL (1-2 hours)      Critically ill patients:  140 - 180 mg/dL   Lab Results  Component Value Date   GLUCAP 214 (H) 03/01/2023   HGBA1C 6.5 (H) 02/09/2023    Review of Glycemic Control  Latest Reference Range & Units 02/28/23 18:16 02/28/23 22:17 03/01/23 08:47 03/01/23 10:26  Glucose-Capillary 70 - 99 mg/dL 841 (H) 324 (H) 401 (H) 214 (H)  (H): Data is abnormally high Diabetes history: DM type 1 Outpatient Diabetes medications: NPH 20 units qam, 10 units qpm, Humalog 0-15 units tid Current orders for Inpatient glycemic control:  Periop Q2 hour Novolog SSI 0-7  A1c 6.5% on 02/09/2023  Inpatient Diabetes Program Recommendations:    -   Add Semglee 10 units -   Add Novolog 0-9 units tid + hs  Thanks, Christena Deem RN, MSN, BC-ADM Inpatient Diabetes Coordinator Team Pager [phone] (8a-5p)

## 2023-03-01 NOTE — Progress Notes (Signed)
Pharmacy Antibiotic Note  [name] Hageman. is a 78 y.o. male admitted on 02/28/2023 with  recurrent osteomyelitis .  Pharmacy has been consulted for Vancomycin and Cefepime dosing. S/p L great toe amputation last month for osteo - now with new wound at L second toe with Xray in office concerning for osteo. Pt also on Flagyl.  Pt wt ~89 kg, est CrCl ~ 45 ml/min  Plan: Cefepime 2gm IV q12h Vancomycin 1750mg  IV now then 1250 mg IV Q 24 hrs. Goal AUC 400-550. Expected AUC: 472 SCr used: 1.58 Will f/u renal function, micro data, and pt's clinical condition Vanc levels prn      Temp (24hrs), Avg:97.8 F (36.6 C), Min:97.3 F (36.3 C), Max:98.2 F (36.8 C)  Recent Labs  Lab 02/28/23 1825 03/01/23 0605  WBC 7.0 5.6  CREATININE 1.92* 1.58*    CrCl cannot be calculated (Unknown ideal weight.).    No Known Allergies  Antimicrobials this admission: 1/22 Vanc >>  1/22 Cefepime >>  1/22 Flagyl >>  Microbiology results: 1/21 BCx:  1/21 MRSA PCR: negative  Thank you for allowing pharmacy to be a part of this patient's care.  Christoper Fabian, PharmD, BCPS Please see amion for complete clinical pharmacist phone list 03/01/2023 9:14 AM

## 2023-03-02 ENCOUNTER — Encounter (HOSPITAL_COMMUNITY): Payer: Self-pay | Admitting: Podiatry

## 2023-03-02 DIAGNOSIS — M86171 Other acute osteomyelitis, right ankle and foot: Secondary | ICD-10-CM

## 2023-03-02 LAB — CBC
HCT: 36 % — ABNORMAL LOW (ref 39.0–52.0)
Hemoglobin: 11.8 g/dL — ABNORMAL LOW (ref 13.0–17.0)
MCH: 34 pg (ref 26.0–34.0)
MCHC: 32.8 g/dL (ref 30.0–36.0)
MCV: 103.7 fL — ABNORMAL HIGH (ref 80.0–100.0)
Platelets: 138 10*3/uL — ABNORMAL LOW (ref 150–400)
RBC: 3.47 MIL/uL — ABNORMAL LOW (ref 4.22–5.81)
RDW: 14.9 % (ref 11.5–15.5)
WBC: 5.8 10*3/uL (ref 4.0–10.5)
nRBC: 0 % (ref 0.0–0.2)

## 2023-03-02 LAB — GLUCOSE, CAPILLARY
Glucose-Capillary: 503 mg/dL (ref 70–99)
Glucose-Capillary: 458 mg/dL — ABNORMAL HIGH (ref 70–99)
Glucose-Capillary: 433 mg/dL — ABNORMAL HIGH (ref 70–99)
Glucose-Capillary: 483 mg/dL — ABNORMAL HIGH (ref 70–99)
Glucose-Capillary: 313 mg/dL — ABNORMAL HIGH (ref 70–99)
Glucose-Capillary: 286 mg/dL — ABNORMAL HIGH (ref 70–99)

## 2023-03-02 LAB — RENAL FUNCTION PANEL
Albumin: 1.9 g/dL — ABNORMAL LOW (ref 3.5–5.0)
Anion gap: 13 (ref 5–15)
BUN: 28 mg/dL — ABNORMAL HIGH (ref 8–23)
CO2: 20 mmol/L — ABNORMAL LOW (ref 22–32)
Calcium: 7.6 mg/dL — ABNORMAL LOW (ref 8.9–10.3)
Chloride: 96 mmol/L — ABNORMAL LOW (ref 98–111)
Creatinine, Ser: 1.82 mg/dL — ABNORMAL HIGH (ref 0.61–1.24)
GFR, Estimated: 38 mL/min — ABNORMAL LOW (ref >60)
Glucose, Bld: 494 mg/dL — ABNORMAL HIGH (ref 70–99)
Phosphorus: 3.7 mg/dL (ref 2.5–4.6)
Potassium: 5.6 mmol/L — ABNORMAL HIGH (ref 3.5–5.1)
Sodium: 129 mmol/L — ABNORMAL LOW (ref 135–145)

## 2023-03-02 LAB — FUNGAL ORGANISM REFLEX

## 2023-03-02 LAB — FUNGUS CULTURE WITH STAIN

## 2023-03-02 LAB — MAGNESIUM: Magnesium: 2 mg/dL (ref 1.7–2.4)

## 2023-03-02 LAB — FUNGUS CULTURE RESULT

## 2023-03-02 MED ORDER — INSULIN GLARGINE-YFGN 100 UNIT/ML ~~LOC~~ SOLN
10.0000 [IU] | Freq: Two times a day (BID) | SUBCUTANEOUS | Status: DC
  Administered 2023-03-02 – 2023-03-03 (×2): 10 [IU] via SUBCUTANEOUS
  Filled 2023-03-02 (×3): qty 0.1

## 2023-03-02 MED ORDER — INSULIN ASPART 100 UNIT/ML IJ SOLN
0.0000 [IU] | Freq: Three times a day (TID) | INTRAMUSCULAR | Status: DC
  Administered 2023-03-02 – 2023-03-03 (×2): 7 [IU] via SUBCUTANEOUS
  Administered 2023-03-03: 5 [IU] via SUBCUTANEOUS

## 2023-03-02 MED ORDER — INSULIN ASPART 100 UNIT/ML IJ SOLN
10.0000 [IU] | Freq: Once | INTRAMUSCULAR | Status: AC
  Administered 2023-03-02: 10 [IU] via SUBCUTANEOUS

## 2023-03-02 MED ORDER — INSULIN ASPART 100 UNIT/ML IJ SOLN: 3.0000 [IU] | Freq: Three times a day (TID) | INTRAMUSCULAR | Status: DC

## 2023-03-02 MED ORDER — INSULIN GLARGINE-YFGN 100 UNIT/ML ~~LOC~~ SOLN
10.0000 [IU] | Freq: Every day | SUBCUTANEOUS | Status: DC
  Administered 2023-03-02: 10 [IU] via SUBCUTANEOUS
  Filled 2023-03-02: qty 0.1

## 2023-03-02 MED ORDER — INSULIN ASPART 100 UNIT/ML IJ SOLN
0.0000 [IU] | Freq: Every day | INTRAMUSCULAR | Status: DC
  Administered 2023-03-02: 3 [IU] via SUBCUTANEOUS

## 2023-03-02 MED ORDER — SODIUM CHLORIDE 0.9 % IV SOLN: INTRAVENOUS | Status: DC

## 2023-03-02 MED ORDER — SODIUM ZIRCONIUM CYCLOSILICATE 10 G PO PACK
10.0000 g | PACK | Freq: Once | ORAL | Status: AC
  Administered 2023-03-02: 10 g via ORAL
  Filled 2023-03-02: qty 1

## 2023-03-02 NOTE — Evaluation (Signed)
Occupational Therapy Evaluation Patient Details Name: [name] Ozaki Ceasar Jr. MRN: [id number] DOB: October 17, 1945 Today's Date: 03/02/2023   History of Present Illness 78 y.o. male presents to [facility] 02/28/23 w/ osteomyelitis of R great toe and L second toe. 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   Clinical Impression   Pt c/o no pain, states he has not been able to feel his feet for months/years. Pt lives with wife who at baseline assists with bathing, cooking, cleaning, Pt able to complete most ADLs independently, uses rollator. Pt currently close to baseline, increased effort for LB ADLs mod A for don/doff B post op shoes, wife can assist at home if needed. Pt able to ambulate around room with RW at [facility] for safety, R foot drags a little but did not impede balance or safety, able to pull RLE through step and place it safely each step. Pt educated on safety awareness, use of RW for increased support over rollator. Pt states he is eager to return home, no OT follow up expected, will continue to follow acutely to maximize independence and return to PLOF.       If plan is discharge home, recommend the following: A little help with walking and/or transfers;A little help with bathing/dressing/bathroom;Assistance with cooking/housework;Assist for transportation;Help with stairs or ramp for entrance    Functional Status Assessment  Patient has had a recent decline in their functional status and demonstrates the ability to make significant improvements in function in a reasonable and predictable amount of time.  Equipment Recommendations  Other (comment) (RW)    Recommendations for Other Services       Precautions /  Restrictions Precautions Precautions: Fall Restrictions Weight Bearing Restrictions Per Provider Order: Yes RLE Weight Bearing Per Provider Order: Weight bearing as tolerated LLE Weight Bearing Per Provider Order: Weight bearing as tolerated Other Position/Activity Restrictions: with post op shoes      Mobility Bed Mobility Overal bed mobility: Modified Independent Bed Mobility: Supine to Sit Rolling: Modified independent (Device/Increase time)         General bed mobility comments: mod I increased time    Transfers Overall transfer level: Needs assistance Equipment used: Rolling walker (2 wheels) Transfers: Sit to/from Stand, Bed to chair/wheelchair/BSC Sit to Stand: Contact guard assist     Step pivot transfers: Contact guard assist     General transfer comment: CGA for safety      Balance Overall balance assessment: Needs assistance Sitting-balance support: Feet supported, No upper extremity supported Sitting balance-Leahy Scale: Good     Standing balance support: Bilateral upper extremity supported, During functional activity, Reliant on assistive device for balance Standing balance-Leahy Scale: Poor Standing balance comment: uses RW for support                           ADL either performed or assessed with clinical judgement   ADL Overall ADL's : At baseline;Needs assistance/impaired Eating/Feeding: Independent   Grooming: Set up;Sitting;Supervision/safety;Standing   Upper Body Bathing: Set up;Sitting   Lower Body Bathing: Moderate assistance;Sitting/lateral leans   Upper Body Dressing : Set up;Sitting   Lower Body Dressing: Moderate assistance;Sit to/from stand   Toilet Transfer: Contact guard assist;Rolling  walker (2 wheels)   Toileting- Clothing Manipulation and Hygiene: Set up;Sitting/lateral lean   Tub/ Shower Transfer: Contact guard assist;Rolling walker (2 wheels);Shower seat   Functional mobility during ADLs: Contact guard  assist;Rolling walker (2 wheels) General ADL Comments: CGA for transfers, mod A for LB dressing/bathing, donning post op shoes. Pt typically has wife to assist if needed     Vision Baseline Vision/History: 1 Wears glasses Ability to See in Adequate Light: 0 Adequate Patient Visual Report: No change from baseline       Perception         Praxis         Pertinent Vitals/Pain Pain Assessment Pain Assessment: No/denies pain     Extremity/Trunk Assessment Upper Extremity Assessment Upper Extremity Assessment: LUE deficits/detail LUE Deficits / Details: history of stroke affecting LUE, poor coordination and FM, functional gross grip LUE Coordination: decreased fine motor   Lower Extremity Assessment Lower Extremity Assessment: Defer to PT evaluation       Communication Communication Communication: No apparent difficulties   Cognition Arousal: Alert Behavior During Therapy: WFL for tasks assessed/performed Overall Cognitive Status: Within Functional Limits for tasks assessed                                       General Comments       Exercises     Shoulder Instructions      Home Living Family/patient expects to be discharged to:: Private residence Living Arrangements: Spouse/significant other Available Help at Discharge: Family;Available 24 hours/day;Neighbor Type of Home: House Home Access: Level entry     Home Layout: One level     Bathroom Shower/Tub: Chief Strategy Officer: Standard     Home Equipment: Rollator (4 wheels);Electric scooter;Shower seat;BSC/3in1   Additional Comments: Pt plans to get walk in shower soon      Prior Functioning/Environment Prior Level of Function : Needs assist             Mobility Comments: Reported primary use of rollator and occasional use of scooter. ADLs Comments: Reported being modified independent with toileting and dressing and his spouse assisted with bathing. He does not  drive and his spouse manages household chores.        OT Problem List: Decreased strength;Decreased range of motion;Decreased activity tolerance;Impaired balance (sitting and/or standing);Impaired UE functional use      OT Treatment/Interventions: [place]-care/ADL training;Therapeutic exercise;Energy conservation;DME and/or AE instruction;Therapeutic activities;Patient/family education    OT Goals(Current goals can be found in the care plan section) Acute Rehab OT Goals Patient Stated Goal: to return home OT Goal Formulation: With patient Time For Goal Achievement: 03/16/23 Potential to Achieve Goals: Good  OT Frequency: Min 1X/week    Co-evaluation              AM-PAC OT "6 Clicks" Daily Activity     Outcome Measure Help from another person eating meals?: None Help from another person taking care of personal grooming?: A Little Help from another person toileting, which includes using toliet, bedpan, or urinal?: A Little Help from another person bathing (including washing, rinsing, drying)?: A Little Help from another person to put on and taking off regular upper body clothing?: A Little Help from another person to put on and taking off regular lower body clothing?: A Lot 6 Click Score: 18   End of Session Equipment Utilized During Treatment: Gait belt;Rolling walker (2 wheels)  Nurse Communication: Mobility status  Activity Tolerance: Patient tolerated treatment well Patient left: in chair;with call bell/phone within reach  OT Visit Diagnosis: Unsteadiness on feet (R26.81);Other abnormalities of gait and mobility (R26.89);Muscle weakness (generalized) (M62.81)                Time: 1240-1315 OT Time Calculation (min): 35 min Charges:  OT General Charges $OT Visit: 1 Visit OT Evaluation $OT Eval Low Complexity: 1 Low OT Treatments $[place] Care/Home Management : 8-22 mins  [place], OTR/L   Alexis Goodell 03/02/2023, 2:36 PM

## 2023-03-02 NOTE — Progress Notes (Addendum)
   03/02/23 1020  TOC Brief Assessment  Insurance and Status Reviewed  Patient has primary care physician Yes  Home environment has been reviewed wife  Prior level of function: has DME  Social Drivers of Health Review SDOH reviewed no interventions necessary  Transition of care needs no transition of care needs at this time     Spoke to patient at bedside. Recently was at [facility] for rehab . Patient has walker at home.   PT/OT to see him today . Patient states wife will visit today .   TOC team following awaiting PT/OT recommendations.   Wife wants to continue home health services with [facility] . Lynette with [facility] requesting new orders and face to face. Entered and asked MD to sign

## 2023-03-02 NOTE — Evaluation (Signed)
Physical Therapy Evaluation Patient Details Name: [name] Pheng Kutner Jr. MRN: [id number] DOB: 02-22-1945 Today's Date: 03/02/2023  History of Present Illness  78 y.o. male presents to [facility] 02/28/23 w/ osteomyelitis of R great toe and L second toe. 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   Clinical Impression  Pt in bed upon arrival and agreeable to PT eval. Prior to admit pt was ModI for short distance ambulation with rollator. Pt had a recent rehab stay and reports he is close to functional mobility baseline after this recent surgery. In today's session, pt was able to stand with CGA and RW. Pt was slightly unsteady and required two attempts to stand. Pt was able to ambulate ~15 ft with CGA and RW.  Pt presents to therapy session with decreased sensation, strength, balance and mobility. Pt would benefit from acute skilled PT to address functional impairments. Recommending post-acute HHPT to work towards independence with mobility. Pt will have 24/7 physical assist at home upon d/c. Recommended that his wife stay close for the first couple of days to ensure stability with mobility. Acute PT to follow.          If plan is discharge home, recommend the following: A little help with walking and/or transfers;A little help with bathing/dressing/bathroom;Assist for transportation;Help with stairs or ramp for entrance   Can travel by private vehicle   Yes    Equipment Recommendations None recommended by PT     Functional Status Assessment Patient has had a recent decline in their functional status and demonstrates the ability to make significant improvements in function in a reasonable and predictable amount of time.      Precautions / Restrictions Precautions Precautions: Fall Restrictions Weight Bearing Restrictions Per Provider Order: Yes RLE Weight Bearing Per Provider Order: Weight bearing as tolerated LLE Weight Bearing Per Provider Order: Weight bearing as tolerated Other Position/Activity Restrictions: with post op shoes      Mobility  Bed Mobility Overal bed mobility: Modified Independent Bed Mobility: Supine to Sit, Sit to Supine Rolling: Modified independent (Device/Increase time)    General bed mobility comments: increased time    Transfers Overall transfer level: Needs assistance Equipment used: Rolling walker (2 wheels) Transfers: Sit to/from Stand Sit to Stand: Contact guard assist    General transfer comment: CGA for slight unsteadiness, two attempts to stand     03/02/23 1438  Ambulation/Gait  Ambulation/Gait assistance Contact guard assist  Gait Distance (Feet) 15 Feet  Assistive device Rolling walker (2 wheels)  Gait Pattern/deviations Step-through pattern;Shuffle;Steppage  General Gait Details decreased ankle DF during swing phase and tends to flex bilateral hips to take steps. Steady with no LOB  Gait velocity decr       Balance Overall balance assessment: Needs assistance Sitting-balance support: Feet supported, No upper extremity supported Sitting balance-Leahy Scale: Good     Standing balance support: Bilateral upper extremity supported, During functional activity, Reliant on assistive device for balance Standing balance-Leahy Scale: Poor Standing balance comment: uses RW for support        Pertinent Vitals/Pain Pain Assessment Pain Assessment: No/denies pain    Home Living Family/patient expects to be discharged to:: Private residence Living Arrangements: Spouse/significant other Available Help at Discharge:  Family;Available 24 hours/day;Neighbor Type of Home: House Home Access: Level entry       Home Layout: One level Home Equipment: Rollator  (4 wheels);Electric scooter;Shower seat;BSC/3in1 Additional Comments: Pt plans to get walk in shower soon    Prior Function Prior Level of Function : Needs assist      Mobility Comments: primarily uses rollator for mobility, occasionally uses scooter ADLs Comments: ModI with toileting and dressing and his spouse assisted with bathing. He does not drive and his spouse manages household chores.     Extremity/Trunk Assessment   Upper Extremity Assessment Upper Extremity Assessment: Defer to OT evaluation LUE Deficits / Details: history of stroke affecting LUE, poor coordination and FM, functional gross grip LUE Coordination: decreased fine motor    Lower Extremity Assessment Lower Extremity Assessment: Generalized weakness (decreased light touch bilaterally, pt reports numbness in B feet through mid shin for years)    Cervical / Trunk Assessment Cervical / Trunk Assessment: Kyphotic  Communication   Communication Communication: No apparent difficulties  Cognition Arousal: Alert Behavior During Therapy: WFL for tasks assessed/performed Overall Cognitive Status: Within Functional Limits for tasks assessed     General Comments General comments (skin integrity, edema, etc.): VSS on RA, dressings intact and dry     PT Assessment Patient needs continued PT services  PT Problem List Decreased strength;Decreased activity tolerance;Decreased balance;Decreased mobility;Impaired sensation       PT Treatment Interventions DME instruction;Gait training;Functional mobility training;Therapeutic activities;Therapeutic exercise;Balance training;Patient/family education    PT Goals (Current goals can be found in the Care Plan section)  Acute Rehab PT Goals Patient Stated Goal: to go home PT Goal Formulation: With patient Time For Goal Achievement: 03/16/23 Potential to Achieve Goals: Good    Frequency Min 1X/week        AM-PAC PT "6 Clicks" Mobility  Outcome Measure Help needed  turning from your back to your side while in a flat bed without using bedrails?: None Help needed moving from lying on your back to sitting on the side of a flat bed without using bedrails?: None Help needed moving to and from a bed to a chair (including a wheelchair)?: A Little Help needed standing up from a chair using your arms (e.g., wheelchair or bedside chair)?: A Little Help needed to walk in hospital room?: A Little Help needed climbing 3-5 steps with a railing? : Total 6 Click Score: 18    End of Session Equipment Utilized During Treatment: Other (comment) (B post-op shoes) Activity Tolerance: Patient tolerated treatment well Patient left: in bed;Other (comment);with call bell/phone within reach;with bed alarm set (MD present) Nurse Communication: Mobility status PT Visit Diagnosis: Unsteadiness on feet (R26.81);Difficulty in walking, not elsewhere classified (R26.2);Muscle weakness (generalized) (M62.81)    Time: 3235-5732 PT Time Calculation (min) (ACUTE ONLY): 14 min   Charges:   PT Evaluation $PT Eval Low Complexity: 1 Low   PT General Charges $$ ACUTE PT VISIT: 1 Visit         Hilton Cork, PT, DPT Secure Chat Preferred  Rehab Office [phone]   Arturo Morton Brion Aliment 03/02/2023, 2:49 PM

## 2023-03-02 NOTE — Progress Notes (Signed)
  Subjective:  Patient ID: [name] Manis., male    DOB: 1945-04-17,  MRN: [id number]   DOS: 03/01/23 Procedure: Right hallux amputation MPJ level, left 2nd toe amp at base of proximal phalanx.   78 y.o. male seen for post op check. Reports he is doing well, denies pain or other concerns. We discussed follow up in a week.  Review of Systems: Negative except as noted in the HPI. Denies N/V/F/Ch.   Objective:   Vitals:   03/02/23 0538 03/02/23 0927  BP: (!) 123/45 (!) 123/45  Pulse: 68 68  Resp: 16   Temp: 98.1 F (36.7 C)   SpO2: 94%    Body mass index is 26.64 kg/m. Constitutional Well developed. Well nourished.  Vascular Foot warm and well perfused. Capillary refill normal to all digits.   No calf pain with palpation  Neurologic Normal speech. Oriented to person, place, and time. Epicritic sensation Absent to forefoot  Dermatologic Right and left foot amputation sites healing well with no acute concerns.      Orthopedic: S/p R hallux amp  and left hallux and 2nd toe amp   Radiographs: disarticulation of right great toe MPJ level, left 2nd toe amp through proximal phalanx base  Pathology: pending  Micro: None taken  Assessment:  POD 1 s/p R hallux amp and left 2nd toe amp 2/2 osteomyelitis   Plan:  Patient was evaluated and treated and all questions answered.  POD # 1 s/p above procedures, amputation sites viable and healing well -Progressing well post op -XR: expected post op changes -WB Status: WBAT in bilateral post op shoes -Sutures: remain intact 2-3 weeks. -Medications/ABX: Recommend 7 days augmentin on discharge -Foot redressed. Leave these dressings on until follow up in 7 days [place] office, pt / his wife should call - Stable for DC later today vs tomorrow per primary, follow up in clinic, will sign off at this time        Corinna Gab, DPM [facility] / [facility]

## 2023-03-02 NOTE — Inpatient Diabetes Management (Signed)
Inpatient Diabetes Program Recommendations  AACE/ADA: New Consensus Statement on Inpatient Glycemic Control (2015)  Target Ranges:  Prepandial:   less than 140 mg/dL      Peak postprandial:   less than 180 mg/dL (1-2 hours)      Critically ill patients:  140 - 180 mg/dL   Lab Results  Component Value Date   GLUCAP 458 (H) 03/02/2023   HGBA1C 6.5 (H) 02/09/2023    Review of Glycemic Control  Latest Reference Range & Units 03/01/23 10:26 03/01/23 13:39 03/01/23 16:53 03/01/23 20:32 03/02/23 09:21 03/02/23 10:17  Glucose-Capillary 70 - 99 mg/dL 191 (H) 478 (H) 295 (H) 320 (H) 503 (HH) 458 (H)  (HH): Data is critically high (H): Data is abnormally high Diabetes history: DM type 1 Outpatient Diabetes medications: NPH 20 units qam, 10 units qpm, Humalog 0-15 units tid Current orders for Inpatient glycemic control:  Semglee 10 units every day, Novolog 0-9 units TID & HS   A1c 6.5% on 02/09/2023   Inpatient Diabetes Program Recommendations:    Noted hyperglycemia this AM. Patient missed basal dose yesterday. Labs completed.   Consider increasing Semglee to 10 units BID.  Anticipate patient will also require meal coverage once consistent: Novolog 3 units TID (assuming patient consumes >50% of meals) Secure chat sent to MD  Thanks, Lujean Rave, MSN, RNC-OB Diabetes Coordinator [phone] (8a-5p)

## 2023-03-02 NOTE — Progress Notes (Signed)
PROGRESS NOTE  [name] M Gabrielle Jr.  DOB: 09/13/1945  PCP: Eloisa Northern, MD ZOX:[id number]  DOA: 02/28/2023  LOS: 2 days  Hospital Day: 3  Brief narrative: [name] Manis. is a 78 y.o. male with PMH significant for DM2, HTN, HLD, CAD/MI/stents, multiple sclerosis, CVA, amyloidosis, neuropathy, chronic pain, depression and recurrent osteomyelitis follows up with podiatry as an outpatient. Patient had left great toe amputation for osteomyelitis last month.   1/21, patient was seen at podiatry office due to concern for left second toe osteomyelitis.  X-ray was concerning for osteomyelitis.  There was also concern about right great toe.   MRI of right foot ordered and confirmed osteomyelitis.   He was sent to ED for further evaluation and management. CRP 10.4.  ESR 43.  He was started on broad-spectrum antibiotics Admitted to [facility] 1/22, patient underwent amputation of right hallux and left second toe by Dr. Annamary Rummage.   Subjective: Patient was seen and examined this afternoon.  Lying on bed.  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 Annamary Rummage on 02/28/2022.  Covered with broad-spectrum antibiotics Blood cultures NGTD.  Hemodynamically stable. Per podiatry recommendation,  -Augmentin for 7 days -WBAT in postop shoe  -outpatient follow-up on 1/30 -PT/OT eval  Type 2 diabetes mellitus with hyperglycemia A1c 6.5 on 02/09/2023 PTA meds-NPH 20 units a.m., 10 units p.m. , Humalog 0 to 15 units 3 times daily Premeal Blood  sugar level elevated on last 24 hours, 494 blood work this morning Given 2 doses of short acting insulin 10 units this morning Started on Semglee 10 units twice daily, NovoLog 3 units 3 times daily along with sliding scale insulin.  Continue to monitor Diabetes care coordinator consult appreciated Recent Labs  Lab 03/01/23 2032 03/02/23 0921 03/02/23 1017 03/02/23 1118 03/02/23 1359  GLUCAP 320* 503* 458* 433* 483*    AKI on CKD 3 A Creatinine elevated to 1.82.  Baseline creatinine less than 1.3 Start IV fluid for next 24 hours Recent Labs    02/08/23 1114 02/08/23 1400 02/09/23 0654 02/10/23 0621 02/11/23 0752 02/12/23 0705 02/13/23 0536 02/28/23 1825 03/01/23 0605 03/02/23 0620  BUN 27* 23 22 20 20 21  26* 28* 27* 28*  CREATININE 1.20 1.06 1.29* 0.95 0.97 1.10 1.34* 1.92* 1.58* 1.82*   Hyperkalemia Potassium level elevated over 5 yesterday and today.  1 dose of Lokelma was given yesterday.  I would repeat the same today Continue to monitor Recent Labs  Lab 02/28/23 1825 03/01/23 0605 03/02/23 0620  K 5.0 5.3* 5.6*  MG  --   --  2.0  PHOS  --   --  3.7   Hyponatremia Likely due to elevated blood glucose level.  Continue to monitor Recent Labs  Lab 02/28/23 1825 03/01/23 0605 03/02/23 0620  NA 133* 131* 129*   Essential hypertension PTA meds- metoprolol tartrate 25 mg twice daily, amlodipine 5 mg daily Continue both  CAD/MI/stents CVA, HLD PTA meds- aspirin 81 mg daily, Plavix 75 mg daily, Lipitor 10 mg daily Continue all  Anxiety/depression PTA meds- Cymbalta 60 mg daily, trazodone 150 mg nightly Continue both    Chronic Pain Continue home Nucynta and oxycodone.    Mobility: Seen by PT  Goals of care   Code Status: Limited: Do not attempt resuscitation (DNR) -DNR-LIMITED -Do Not Intubate/DNI      DVT prophylaxis:  SCDs Start: 02/28/23 1739   Antimicrobials: Oral Augmentin Fluid: NS at 100 mL/h for 24 hours Consultants: Podiatry Family  Communication: Discussed with on the phone  Status: Inpatient Level of care:  Telemetry Surgical   Patient is from: Home Needs to continue in-hospital care: Elevated creatinine, elevated potassium Anticipated d/c to: Hopefully home with home health tomorrow      Diet:  Diet Order             Diet heart healthy/carb modified Fluid consistency: Thin  Diet effective now                   Scheduled Meds:  amLODipine  5 mg Oral q morning   amoxicillin-clavulanate  1 tablet Oral Q12H   aspirin EC  81 mg Oral Daily   atorvastatin  10 mg Oral QHS   clopidogrel  75 mg Oral Daily   DULoxetine  60 mg Oral BID   insulin aspart  0-5 Units Subcutaneous QHS   insulin aspart  0-9 Units Subcutaneous TID WC   insulin aspart  3 Units Subcutaneous TID WC   insulin glargine-yfgn  10 Units Subcutaneous BID   metoprolol tartrate  25 mg Oral BID   mupirocin ointment  1 Application Nasal BID   sodium chloride flush  3 mL Intravenous Q12H   tapentadol  200 mg Oral BID AC & HS   traZODone  150 mg Oral QHS    PRN meds: acetaminophen **OR** acetaminophen, oxyCODONE, polyethylene glycol   Infusions:   sodium chloride      Antimicrobials: Anti-infectives (From admission, onward)    Start     Dose/Rate Route Frequency Ordered Stop   03/01/23 2200  amoxicillin-clavulanate (AUGMENTIN) 875-125 MG per tablet 1 tablet        1 tablet Oral Every 12 hours 03/01/23 1551 03/08/23 2159   03/01/23 1000  metroNIDAZOLE (FLAGYL) IVPB 500 mg  Status:  Discontinued        500 mg 100 mL/hr over 60 Minutes Intravenous Every 12 hours 03/01/23 0844 03/01/23 1551   03/01/23 0930  vancomycin (VANCOREADY) IVPB 1750 mg/350 mL  Status:  Discontinued        1,750 mg 175 mL/hr over 120 Minutes Intravenous  Once 03/01/23 0854 03/01/23 1551   03/01/23 0930  ceFEPIme (MAXIPIME) 2 g in sodium chloride 0.9 % 100 mL IVPB  Status:  Discontinued        2 g 200 mL/hr over 30 Minutes Intravenous Every 12 hours 03/01/23  0920 03/01/23 1551   03/01/23 0900  ceFEPIme (MAXIPIME) 2 g in sodium chloride 0.9 % 100 mL IVPB  Status:  Discontinued        2 g 200 mL/hr over 30 Minutes Intravenous Every 8 hours 03/01/23 0854 03/01/23 0920       Objective: Vitals:   03/02/23 0538 03/02/23 0927  BP: (!) 123/45 (!) 123/45  Pulse: 68 68  Resp: 16   Temp: 98.1 F (36.7 C)   SpO2: 94%     Intake/Output Summary (Last 24 hours) at 03/02/2023 1444 Last data filed at 03/02/2023 1300 Gross per 24 hour  Intake 1852.77 ml  Output 1600 ml  Net 252.77 ml   Filed Weights   03/01/23 0950  Weight: 89.1 kg   Weight change:  Body mass index is 26.64 kg/m.   Physical Exam: General exam: Pleasant,  Skin: No rashes, lesions or ulcers. HEENT: Atraumatic, normocephalic, no obvious bleeding Lungs: Clear to auscultation bilaterally,  CVS: S1, S2, no murmur,   GI/Abd: Soft, nontender, nondistended, bowel sound present,   CNS: Alert, awake, oriented x 3.  Speech slurred at baseline Psychiatry: Mood appropriate, Extremities: No pedal edema, no calf tenderness, bandages on both feet  Data Review: I have personally reviewed the laboratory data and studies available.  F/u labs  Unresulted Labs (From admission, onward)     Start     Ordered   03/03/23 0500  CBC with Differential/Platelet  Tomorrow morning,   R        03/02/23 1423   03/03/23 0500  Basic metabolic panel  Tomorrow morning,   R        03/02/23 1423            Total time spent in review of labs and imaging, patient evaluation, formulation of plan, documentation and communication with family: 55 minutes  Signed, Lorin Glass, MD [facility] 03/02/2023

## 2023-03-02 NOTE — Plan of Care (Signed)

## 2023-03-03 ENCOUNTER — Other Ambulatory Visit (HOSPITAL_COMMUNITY): Payer: Self-pay

## 2023-03-03 DIAGNOSIS — M86171 Other acute osteomyelitis, right ankle and foot: Secondary | ICD-10-CM | POA: Diagnosis not present

## 2023-03-03 LAB — BASIC METABOLIC PANEL
Anion gap: 7 (ref 5–15)
BUN: 30 mg/dL — ABNORMAL HIGH (ref 8–23)
CO2: 22 mmol/L (ref 22–32)
Calcium: 7.2 mg/dL — ABNORMAL LOW (ref 8.9–10.3)
Chloride: 105 mmol/L (ref 98–111)
Creatinine, Ser: 1.64 mg/dL — ABNORMAL HIGH (ref 0.61–1.24)
GFR, Estimated: 43 mL/min — ABNORMAL LOW (ref >60)
Glucose, Bld: 309 mg/dL — ABNORMAL HIGH (ref 70–99)
Potassium: 4.7 mmol/L (ref 3.5–5.1)
Sodium: 134 mmol/L — ABNORMAL LOW (ref 135–145)

## 2023-03-03 LAB — CBC WITH DIFFERENTIAL/PLATELET
Abs Immature Granulocytes: 0.02 10*3/uL (ref 0.00–0.07)
Basophils Absolute: 0 10*3/uL (ref 0.0–0.1)
Basophils Relative: 1 %
Eosinophils Absolute: 0.4 10*3/uL (ref 0.0–0.5)
Eosinophils Relative: 6 %
HCT: 33.8 % — ABNORMAL LOW (ref 39.0–52.0)
Hemoglobin: 11 g/dL — ABNORMAL LOW (ref 13.0–17.0)
Immature Granulocytes: 0 %
Lymphocytes Relative: 25 %
Lymphs Abs: 1.5 10*3/uL (ref 0.7–4.0)
MCH: 33.6 pg (ref 26.0–34.0)
MCHC: 32.5 g/dL (ref 30.0–36.0)
MCV: 103.4 fL — ABNORMAL HIGH (ref 80.0–100.0)
Monocytes Absolute: 0.7 10*3/uL (ref 0.1–1.0)
Monocytes Relative: 11 %
Neutro Abs: 3.5 10*3/uL (ref 1.7–7.7)
Neutrophils Relative %: 57 %
Platelets: 143 10*3/uL — ABNORMAL LOW (ref 150–400)
RBC: 3.27 MIL/uL — ABNORMAL LOW (ref 4.22–5.81)
RDW: 15 % (ref 11.5–15.5)
WBC: 6.1 10*3/uL (ref 4.0–10.5)
nRBC: 0 % (ref 0.0–0.2)

## 2023-03-03 LAB — SURGICAL PATHOLOGY

## 2023-03-03 LAB — GLUCOSE, CAPILLARY
Glucose-Capillary: 285 mg/dL — ABNORMAL HIGH (ref 70–99)
Glucose-Capillary: 348 mg/dL — ABNORMAL HIGH (ref 70–99)

## 2023-03-03 MED ORDER — AMOXICILLIN-POT CLAVULANATE 875-125 MG PO TABS
1.0000 | ORAL_TABLET | Freq: Two times a day (BID) | ORAL | 0 refills | Status: AC
  Filled 2023-03-03: qty 14, 7d supply, fill #0

## 2023-03-03 NOTE — Plan of Care (Signed)
  Problem: Clinical Measurements: Goal: Diagnostic test results will improve Outcome: Progressing Goal: Respiratory complications will improve Outcome: Progressing Goal: Cardiovascular complication will be avoided Outcome: Progressing   Problem: Activity: Goal: Risk for activity intolerance will decrease Outcome: Progressing   Problem: Nutrition: Goal: Adequate nutrition will be maintained Outcome: Progressing   Problem: Elimination: Goal: Will not experience complications related to bowel motility Outcome: Progressing Goal: Will not experience complications related to urinary retention Outcome: Progressing   Problem: Pain Managment: Goal: General experience of comfort will improve and/or be controlled Outcome: Progressing   Problem: Safety: Goal: Ability to remain free from injury will improve Outcome: Progressing   Problem: Skin Integrity: Goal: Risk for impaired skin integrity will decrease Outcome: Progressing   Problem: Fluid Volume: Goal: Ability to maintain a balanced intake and output will improve Outcome: Progressing   Problem: Metabolic: Goal: Ability to maintain appropriate glucose levels will improve Outcome: Progressing   Problem: Nutritional: Goal: Maintenance of adequate nutrition will improve Outcome: Progressing   Problem: Tissue Perfusion: Goal: Adequacy of tissue perfusion will improve Outcome: Progressing

## 2023-03-03 NOTE — Progress Notes (Signed)
Mobility Specialist Progress Note:   03/03/23 1051  Mobility  Activity Ambulated with assistance in room  Level of Assistance Minimal assist, patient does 75% or more  Assistive Device Front wheel walker  Distance Ambulated (ft) 12 ft  RLE Weight Bearing Per Provider Order WBAT  LLE Weight Bearing Per Provider Order WBAT  Activity Response Tolerated well  Mobility Referral Yes  Mobility visit 1 Mobility  Mobility Specialist Start Time (ACUTE ONLY) 0930  Mobility Specialist Stop Time (ACUTE ONLY) 0950  Mobility Specialist Time Calculation (min) (ACUTE ONLY) 20 min   Pre Mobility: 83 HR , 93% SpO2 2 L During Mobility: 102 HR ,  77%- 94% SpO2 RA- 3 L Post Mobility: 94 HR , 95% SpO2 2 L  Pt received in bed, agreeable to mobility. Pt found with nasal canal slightly off nose. SpO2 in high 70's. VSS on 3 L during ambulation. Pt c/o slight SOB, pursed lip breathing encouraged. Displayed mild unsteady gait during session but no overt LOB present. Pt left in chair with call bell in reach and all needs met. Chair alarm on.   Leory Plowman  Mobility Specialist Please contact via SecureChat Rehab office at [phone]

## 2023-03-03 NOTE — Anesthesia Postprocedure Evaluation (Signed)
Anesthesia Post Note  Patient: [name] Dillenbeck Demauro Jr.  Procedure(s) Performed: AMPUTATION LEFT SECOND TOE (Left: Toe) AMPUTATION RIGHT GREAT TOE (Right: Toe)     Patient location during evaluation: PACU Anesthesia Type: MAC Level of consciousness: awake and alert Pain management: pain level controlled Vital Signs Assessment: post-procedure vital signs reviewed and stable Respiratory status: spontaneous breathing Cardiovascular status: stable Anesthetic complications: no   No notable events documented.  Last Vitals:  Vitals:   03/02/23 1952 03/02/23 2139  BP: (!) 109/48   Pulse: 63 64  Resp: 16   Temp: 36.9 C   SpO2: (!) 87% 90%    Last Pain:  Vitals:   03/02/23 2211  TempSrc:   PainSc: 2                  [name]

## 2023-03-03 NOTE — TOC Progression Note (Signed)
Transition of Care (TOC) - Progression Note    Patient Details  Name: [name] Mckone Duffin Jr. MRN: [id number] Date of Birth: Aug 28, 1945  [facility]) CM/SW Contact  Nadene Rubins Adria Devon, RN Phone Number: 03/03/2023, 10:28 AM  Clinical Narrative:     Received a call from [facility]. Wife requesting a home health aide. [facility] can provide, requesting order. NCM added aide to home health order and secure chatted MD to sign        Expected Discharge Plan and Services                                               Social Determinants of Health (SDOH) Interventions SDOH Screenings   Food Insecurity: No Food Insecurity (02/28/2023)  Housing: Low Risk  (02/28/2023)  Transportation Needs: No Transportation Needs (02/28/2023)  Utilities: Not At Risk (02/28/2023)  Social Connections: Socially Integrated (02/28/2023)  Tobacco Use: Medium Risk (03/01/2023)    Readmission Risk Interventions    02/13/2023   10:26 AM  Readmission Risk Prevention Plan  Transportation Screening Complete  PCP or Specialist Appt within 3-5 Days Complete  Social Work Consult for Recovery Care Planning/Counseling Complete  Palliative Care Screening Not Applicable  Medication Review Oceanographer) Referral to Pharmacy

## 2023-03-03 NOTE — Discharge Summary (Signed)
Physician Discharge Summary  [name] Montez [name]. WGN:[id number] DOB: [birth year] DOA: 02/28/2023  PCP: Eloisa Northern, MD  Admit date: 02/28/2023 Discharge date: 03/03/2023  Admitted From: Home Discharge disposition: Home with home health  Recommendations at discharge:  Continue insulin regimen as before Per podiatry recommendation,  -Augmentin for 7 days -WBAT in postop shoe  -outpatient follow-up on 1/30 -PT/OT eval    Brief narrative: [name]. is a 78 y.o. male with PMH significant for DM2, HTN, HLD, CAD/MI/stents, multiple sclerosis, CVA, amyloidosis, neuropathy, chronic pain, depression and recurrent osteomyelitis follows up with podiatry as an outpatient. Patient had left great toe amputation for osteomyelitis last month.   1/21, patient was seen at podiatry office due to concern for left second toe osteomyelitis.  X-ray was concerning for osteomyelitis.  There was also concern about right great toe.   MRI of right foot ordered and confirmed osteomyelitis.   He was sent to ED for further evaluation and management. CRP 10.4.  ESR 43.  He was started on broad-spectrum antibiotics Admitted to [facility] 1/22, patient underwent amputation of right hallux and left second toe by Dr. Annamary Rummage.   Subjective: Patient was seen and examined this morning. Lying on bed.  Not in distress. Blood work this morning showed improvement in creatinine and potassium. Blood sugar level running elevated but better than yesterday.  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 Annamary Rummage on 02/28/2022.  Covered with broad-spectrum antibiotics. Blood cultures NGTD.  Hemodynamically stable. Per podiatry recommendation,  -Augmentin for 7 days -WBAT in postop shoe  -outpatient follow-up on 1/30 -PT/OT eval  Type 2 diabetes mellitus with  hyperglycemia A1c 6.5 on 02/09/2023 PTA meds-NPH 20 units a.m., 10 units p.m. , Humalog 0 to 15 units 3 times daily Premeal Blood sugar level elevated but better than yesterday. Resume home regimen at discharge. Recent Labs  Lab 03/02/23 1118 03/02/23 1359 03/02/23 1730 03/02/23 2119 03/03/23 0720  GLUCAP 433* 483* 313* 286* 285*    AKI on CKD 3 A Creatinine elevated to 1.82. Baseline creatinine less than 1.3. With IV hydration, creatinine is improving. Recent Labs    02/08/23 1400 02/09/23 0654 02/10/23 7846 02/11/23 0752 02/12/23 0705 02/13/23 0536 02/28/23 1825 03/01/23 0605 03/02/23 0620 03/03/23 0651  BUN 23 22 20 20 21  26* 28* 27* 28* 30*  CREATININE 1.06 1.29* 0.95 0.97 1.10 1.34* 1.92* 1.58* 1.82* 1.64*   Hyperkalemia Potassium level improved with Lokelma and IV fluid Recent Labs  Lab 02/28/23 1825 03/01/23 0605 03/02/23 0620 03/03/23 0651  K 5.0 5.3* 5.6* 4.7  MG  --   --  2.0  --   PHOS  --   --  3.7  --    Hyponatremia Likely due to elevated blood glucose level.  Improving. Recent Labs  Lab 02/28/23 1825 03/01/23 0605 03/02/23 0620 03/03/23 0651  NA 133* 131* 129* 134*   Essential hypertension PTA meds- metoprolol tartrate 25 mg twice daily, amlodipine 5 mg daily Continue both  CAD/MI/stents CVA, HLD PTA meds- aspirin 81 mg daily, Plavix 75 mg daily, Lipitor 10 mg daily Continue all  Anxiety/depression PTA meds- Cymbalta 60 mg daily, trazodone 150 mg nightly Continue both    Chronic Pain Continue home Nucynta and oxycodone.    Mobility: Seen by PT.  Home health resumed.  Goals of care  Code Status: Limited: Do not attempt resuscitation (DNR) -DNR-LIMITED -Do Not Intubate/DNI    Diet:  Diet Order             Diet Carb Modified           Diet heart healthy/carb modified Fluid consistency: Thin  Diet effective now                   Nutritional status:  Body mass index is 26.64 kg/m.       Wounds:  - Pressure  Ulcer 04/21/13 Stage II -  Partial thickness loss of dermis presenting as a shallow open ulcer with a red, pink wound bed without slough. (Active)  Date First Assessed: 04/21/13   Location: Chest  Location Orientation: Left  Staging: Stage II -  Partial thickness loss of dermis presenting as a shallow open ulcer with a red, pink wound bed without slough.  Present on Admission: Yes    Assessments 04/21/2013  7:45 PM 04/22/2013 10:48 AM  Dressing Type Gauze (Comment) Gauze (Comment)  Dressing Clean;Dry;Intact --  Drainage Amount -- Minimal  Drainage Description -- No odor;Sanguineous  Treatment -- Cleansed     No associated orders.     Wound / Incision (Open or Dehisced) 04/21/13 Other (Comment) Neck Left scabbed abrasion (Active)  Date First Assessed: 04/21/13   Wound Type: (c) Other (Comment)  Location: Neck  Location Orientation: Left  Wound Description (Comments): scabbed abrasion  Present on Admission: Yes    Assessments 04/21/2013  7:45 PM 04/22/2013  8:00 AM  Dressing Type Other (Comment);Gauze (Comment) Other (Comment);Gauze (Comment)  Dressing Changed New Other (Comment)  Dressing Status Clean;Dry;Intact Clean;Dry;Intact  Dressing Change Frequency Daily Daily  Site / Wound Assessment Clean;Dry Clean;Dry  % Wound base Black/Eschar 100% --     No associated orders.     Pressure Injury 02/11/23 Buttocks Bilateral Stage 1 -  Intact skin with non-blanchable redness of a localized area usually over a bony prominence. (Active)  Date First Assessed/Time First Assessed: 02/11/23 1027   Location: Buttocks  Location Orientation: Bilateral  Staging: Stage 1 -  Intact skin with non-blanchable redness of a localized area usually over a bony prominence.  Present on Admission: Yes    Assessments 02/11/2023 10:26 AM 03/03/2023  8:10 AM  Dressing Type -- Foam - Lift dressing to assess site every shift  Dressing -- Clean, Dry, Intact  Site / Wound Assessment Clean;Dry;Pink;Red Clean;Dry  Drainage Amount  None --  Drainage Description No odor --  Treatment Cleansed;Off loading;Other (Comment) --     No associated orders.     Wound / Incision (Open or Dehisced) 02/07/23 Amputation Toe (Comment  which one) Anterior;Left (Active)  Date First Assessed/Time First Assessed: 02/07/23 1745   Wound Type: Amputation  Location: Toe (Comment  which one)  Location Orientation: Anterior;Left  Present on Admission: Yes    Assessments 02/11/2023  5:46 PM 03/01/2023  8:10 PM  Dressing Type -- Gauze (Comment)  Dressing Status -- Clean, Dry, Intact  Drainage Amount Minimal --  Drainage Description Sanguineous --  Treatment Cleansed;Other (Comment);Tape changed --     No associated orders.     Incision (Closed) 03/01/23 Foot Left (Active)  Date First Assessed/Time First Assessed: 03/01/23 1216   Location: Foot  Location Orientation: Left    Assessments 03/01/2023  1:20 PM 03/03/2023  8:10 AM  Dressing Type Gauze (Comment);Compression wrap Gauze (Comment);Compression wrap  Dressing Clean, Dry, Intact Clean, Dry, Intact  Site / Wound Assessment  Dressing in place / Unable to assess Dressing in place / Unable to assess  Drainage Amount None --     No associated orders.     Incision (Closed) 03/01/23 Foot Right (Active)  Date First Assessed/Time First Assessed: 03/01/23 1301   Location: Foot  Location Orientation: Right    Assessments 03/01/2023  1:20 PM 03/03/2023  8:10 AM  Dressing Type Gauze (Comment);Compression wrap Gauze (Comment);Compression wrap  Dressing Clean, Dry, Intact Clean, Dry, Intact  Site / Wound Assessment Dressing in place / Unable to assess Dressing in place / Unable to assess  Drainage Amount None --     No associated orders.    Discharge Exam:   Vitals:   03/03/23 0554 03/03/23 0600 03/03/23 0744 03/03/23 0810  BP: (!) 116/53  (!) 102/43 (!) 102/43  Pulse: 70  73   Resp:   17   Temp: 98.9 F (37.2 C)  98 F (36.7 C)   TempSrc: Oral     SpO2: (!) 81% 91% 93%   Weight:       Height:        Body mass index is 26.64 kg/m.  General exam: Pleasant, elderly, lying on bed.,  Not in distress Skin: No rashes, lesions or ulcers. HEENT: Atraumatic, normocephalic, no obvious bleeding Lungs: Clear to auscultation bilaterally,  CVS: S1, S2, no murmur,   GI/Abd: Soft, nontender, nondistended, bowel sound present,   CNS: Alert, awake, oriented x 3.  Speech slurred at baseline Psychiatry: Mood appropriate, Extremities: No pedal edema, no calf tenderness, bandages on both feet  Follow ups:    Follow-up Information     [facility] Follow up.   Specialty: Home Health Services Contact information: [address] [place] [address] [place] [address] [phone]         Eloisa Northern, MD Follow up.   Specialty: Internal Medicine Contact information: [address] [place] [address] [phone]                 Discharge Instructions:   Discharge Instructions     Call MD for:  difficulty breathing, headache or visual disturbances   Complete by: As directed    Call MD for:  extreme fatigue   Complete by: As directed    Call MD for:  hives   Complete by: As directed    Call MD for:  persistant dizziness or light-headedness   Complete by: As directed    Call MD for:  persistant nausea and vomiting   Complete by: As directed    Call MD for:  severe uncontrolled pain   Complete by: As directed    Call MD for:  temperature >100.4   Complete by: As directed    Diet Carb Modified   Complete by: As directed    Discharge instructions   Complete by: As directed    Recommendations at discharge:   Continue insulin regimen as before  Per podiatry recommendation,  -Augmentin for 7 days -WBAT in postop shoe  -outpatient follow-up on 1/30 -PT/OT eval  Discharge instructions for diabetes mellitus: Check blood sugar 3 times a day and bedtime at home. If blood sugar running above 200 or less than 70 please call your MD to  adjust insulin. If you notice signs and symptoms of hypoglycemia (low blood sugar) like jitteriness, confusion, thirst, tremor and sweating, please check blood sugar, drink sugary drink/biscuits/sweets to increase sugar level and call MD or return to ER.  General discharge instructions: Follow with Primary MD Eloisa Northern, MD in 7 days  Please request your PCP  to go over your hospital tests, procedures, radiology results at the follow up. Please get your medicines reviewed and adjusted.  Your PCP may decide to repeat certain labs or tests as needed. Do not drive, operate heavy machinery, perform activities at heights, swimming or participation in water activities or provide baby sitting services if your were admitted for syncope or siezures until you have seen by Primary MD or a Neurologist and advised to do so again. [place] Controlled Substance Reporting System database was reviewed. Do not drive, operate heavy machinery, perform activities at heights, swim, participate in water activities or provide baby-sitting services while on medications for pain, sleep and mood until your outpatient physician has reevaluated you and advised to do so again.  You are strongly recommended to comply with the dose, frequency and duration of prescribed medications. Activity: As tolerated with Full fall precautions use walker/cane & assistance as needed Avoid using any recreational substances like cigarette, tobacco, alcohol, or non-prescribed drug. If you experience worsening of your admission symptoms, develop shortness of breath, life threatening emergency, suicidal or homicidal thoughts you must seek medical attention immediately by calling 911 or calling your MD immediately  if symptoms less severe. You must read complete instructions/literature along with all the possible adverse reactions/side effects for all the medicines you take and that have been prescribed to you. Take any new medicine only  after you have completely understood and accepted all the possible adverse reactions/side effects.  Wear Seat belts while driving. You were cared for by a hospitalist during your hospital stay. If you have any questions about your discharge medications or the care you received while you were in the hospital after you are discharged, you can call the unit and ask to speak with the hospitalist or the covering physician. Once you are discharged, your primary care physician will handle any further medical issues. Please note that NO REFILLS for any discharge medications will be authorized once you are discharged, as it is imperative that you return to your primary care physician (or establish a relationship with a primary care physician if you do not have one).   Discharge wound care:   Complete by: As directed    Increase activity slowly   Complete by: As directed        Discharge Medications:   Allergies as of 03/03/2023   No Known Allergies      Medication List     TAKE these medications    amLODipine 5 MG tablet Commonly known as: NORVASC Take 5 mg by mouth every morning.   amoxicillin-clavulanate 875-125 MG tablet Commonly known as: AUGMENTIN Take 1 tablet by mouth every 12 (twelve) hours for 7 days.   aspirin 81 MG tablet Take 81 mg by mouth daily.   atorvastatin 10 MG tablet Commonly known as: LIPITOR Take 10 mg by mouth at bedtime.   clopidogrel 75 MG tablet Commonly known as: PLAVIX Take 75 mg by mouth daily.   DULoxetine 60 MG capsule Commonly known as: CYMBALTA Take 60 mg by mouth 2 (two) times daily.   finasteride 5 MG tablet Commonly known as: PROSCAR Take 5 mg by mouth daily.   HumaLOG KwikPen 100 UNIT/ML KwikPen Generic drug: insulin lispro Inject 0-15 Units into the skin 3 (three) times daily as needed (BS > 150).   HumuLIN N KwikPen 100 UNIT/ML KwikPen Generic drug: Insulin NPH (Human) (  Isophane) Inject 10-20 Units into the skin See admin  instructions. Inject 20 units in the morning and 10 units in the evening.   metoprolol tartrate 25 MG tablet Commonly known as: LOPRESSOR Take 25 mg by mouth 2 (two) times daily.   Nucynta ER 200 MG Tb12 Generic drug: Tapentadol HCl Take 200 mg by mouth in the morning and at bedtime.   oxyCODONE 15 MG immediate release tablet Commonly known as: ROXICODONE Take 1 tablet (15 mg total) by mouth every 6 (six) hours as needed for pain.   traZODone 150 MG tablet Commonly known as: DESYREL Take 150 mg by mouth at bedtime.               Discharge Care Instructions  (From admission, onward)           Start     Ordered   03/03/23 0000  Discharge wound care:        03/03/23 1158             The results of significant diagnostics from this hospitalization (including imaging, microbiology, ancillary and laboratory) are listed below for reference.    Procedures and Diagnostic Studies:   DG Foot 2 Views Left Result Date: 03/01/2023 CLINICAL DATA:  Postop osteomyelitis EXAM: LEFT FOOT - 2 VIEW COMPARISON:  02/28/2023 FINDINGS: Remote left great toe amputation. Interval amputation of the left 2nd toe at the level of the proximal phalanx. No complicating feature. Fusion in the hindfoot and midfoot. IMPRESSION: Interval amputation of the left 2nd toe. No visible complicating feature. Electronically Signed   By: Charlett Nose M.D.   On: 03/01/2023 17:29   DG Foot 2 Views Right Result Date: 03/01/2023 CLINICAL DATA:  09/21/2022 EXAM: RIGHT FOOT - 2 VIEW COMPARISON:  09/21/2022 FINDINGS: Changes of right great toe amputation at the level of the MTP joint. Bone destruction in the 2nd proximal phalanx may reflect acute or chronic osteomyelitis. No additional acute bony abnormality. IMPRESSION: Right great toe amputation.  No complicating feature. Bone loss/destruction in the right 2nd toe proximal phalanx could reflect acute or chronic osteomyelitis. Electronically Signed   By: Charlett Nose M.D.   On: 03/01/2023 17:28   MR FOOT RIGHT W WO CONTRAST Result Date: 03/01/2023 CLINICAL DATA:  Right great toe wound. Recent left great toe amputation. EXAM: MRI OF THE RIGHT FOREFOOT WITHOUT AND WITH CONTRAST TECHNIQUE: Multiplanar, multisequence MR imaging of the right forefoot was performed before and after the administration of intravenous contrast. CONTRAST:  9mL GADAVIST GADOBUTROL 1 MMOL/ML IV SOLN COMPARISON:  Right foot x-rays dated September 21, 2022. FINDINGS: Bones/Joint/Cartilage Marrow edema and enhancement with corresponding decreased T1 marrow signal involving the medial dorsal base of the first distal phalanx and medial head of the first proximal phalanx. Healing nondisplaced fracture of the first metatarsal head. Degenerative changes of the first MTP joint. No significant joint effusion. Ligaments Collateral ligaments are intact. Muscles and Tendons Flexor and extensor tendons are intact. No tenosynovitis. Complete fatty atrophy of the intrinsic foot muscles. Soft tissue Ulceration at the dorsal aspect of the first IP joint with surrounding soft tissue swelling and enhancement. No fluid collection. No soft tissue mass. IMPRESSION: 1. Ulceration at the dorsal aspect of the first IP joint with underlying osteomyelitis of the first distal and proximal phalanges. No abscess. 2. Healing nondisplaced fracture of the first metatarsal head. Electronically Signed   By: Obie Dredge M.D.   On: 03/01/2023 08:47   DG Foot Complete Left Result Date: 02/28/2023  Please see detailed radiograph report in office note.    Labs:   Basic Metabolic Panel: Recent Labs  Lab 02/28/23 1825 03/01/23 0605 03/02/23 0620 03/03/23 0651  NA 133* 131* 129* 134*  K 5.0 5.3* 5.6* 4.7  CL 98 100 96* 105  CO2 26 25 20* 22  GLUCOSE 204* 234* 494* 309*  BUN 28* 27* 28* 30*  CREATININE 1.92* 1.58* 1.82* 1.64*  CALCIUM 8.0* 7.4* 7.6* 7.2*  MG  --   --  2.0  --   PHOS  --   --  3.7  --     GFR Estimated Creatinine Clearance: 42.7 mL/min (A) (by C-G formula based on SCr of 1.64 mg/dL (H)). Liver Function Tests: Recent Labs  Lab 02/28/23 1825 03/01/23 0605 03/02/23 0620  AST 22 16  --   ALT 13 11  --   ALKPHOS 203* 165*  --   BILITOT 1.0 1.0  --   PROT 6.5 5.4*  --   ALBUMIN 2.1* 1.7* 1.9*   No results for input(s): "LIPASE", "AMYLASE" in the last 168 hours. No results for input(s): "AMMONIA" in the last 168 hours. Coagulation profile No results for input(s): "INR", "PROTIME" in the last 168 hours.  CBC: Recent Labs  Lab 02/28/23 1825 03/01/23 0605 03/02/23 0620 03/03/23 0651  WBC 7.0 5.6 5.8 6.1  NEUTROABS 5.0  --   --  3.5  HGB 13.5 12.4* 11.8* 11.0*  HCT 41.4 38.1* 36.0* 33.8*  MCV 103.2* 105.0* 103.7* 103.4*  PLT 142* 128* 138* 143*   Cardiac Enzymes: No results for input(s): "CKTOTAL", "CKMB", "CKMBINDEX", "TROPONINI" in the last 168 hours. BNP: Invalid input(s): "POCBNP" CBG: Recent Labs  Lab 03/02/23 1118 03/02/23 1359 03/02/23 1730 03/02/23 2119 03/03/23 0720  GLUCAP 433* 483* 313* 286* 285*   D-Dimer No results for input(s): "DDIMER" in the last 72 hours. Hgb A1c No results for input(s): "HGBA1C" in the last 72 hours. Lipid Profile No results for input(s): "CHOL", "HDL", "LDLCALC", "TRIG", "CHOLHDL", "LDLDIRECT" in the last 72 hours. Thyroid function studies No results for input(s): "TSH", "T4TOTAL", "T3FREE", "THYROIDAB" in the last 72 hours.  Invalid input(s): "FREET3" Anemia work up No results for input(s): "VITAMINB12", "FOLATE", "FERRITIN", "TIBC", "IRON", "RETICCTPCT" in the last 72 hours. Microbiology Recent Results (from the past 240 hours)  Culture, blood (Routine X 2) w Reflex to ID Panel     Status: None (Preliminary result)   Collection Time: 02/28/23  6:25 PM   Specimen: BLOOD RIGHT ARM  Result Value Ref Range Status   Specimen Description BLOOD RIGHT ARM  Final   Special Requests   Final    BOTTLES DRAWN  AEROBIC AND ANAEROBIC Blood Culture results may not be optimal due to an inadequate volume of blood received in culture bottles   Culture   Final    NO GROWTH 3 DAYS Performed at [facility], [address], [place], [place] [address]    Report Status PENDING  Incomplete  Culture, blood (Routine X 2) w Reflex to ID Panel     Status: None (Preliminary result)   Collection Time: 02/28/23  6:32 PM   Specimen: BLOOD LEFT HAND  Result Value Ref Range Status   Specimen Description BLOOD LEFT HAND AEROBIC BOTTLE ONLY  Final   Special Requests   Final    BOTTLES DRAWN AEROBIC ONLY Blood Culture results may not be optimal due to an inadequate volume of blood received in culture bottles   Culture   Final  NO GROWTH 3 DAYS Performed at [facility], [address] [place], [place] [address]    Report Status PENDING  Incomplete  Surgical PCR screen     Status: None   Collection Time: 02/28/23  9:33 PM   Specimen: Nasal Mucosa; Nasal Swab  Result Value Ref Range Status   MRSA, PCR NEGATIVE NEGATIVE Final   Staphylococcus aureus NEGATIVE NEGATIVE Final    Comment: (NOTE) The Xpert SA Assay (FDA approved for NASAL specimens in patients 36 years of age and older), is one component of a comprehensive surveillance program. It is not intended to diagnose infection nor to guide or monitor treatment. Performed at [facility], [address], [place], [place] [address]     Time coordinating discharge: 45 minutes  Signed: Marik Sedore  [facility] 03/03/2023, 11:58 AM

## 2023-03-05 LAB — CULTURE, BLOOD (ROUTINE X 2)
Culture: NO GROWTH
Culture: NO GROWTH

## 2023-03-06 DIAGNOSIS — M86172 Other acute osteomyelitis, left ankle and foot: Secondary | ICD-10-CM | POA: Diagnosis not present

## 2023-03-06 DIAGNOSIS — M1990 Unspecified osteoarthritis, unspecified site: Secondary | ICD-10-CM | POA: Diagnosis not present

## 2023-03-06 DIAGNOSIS — E875 Hyperkalemia: Secondary | ICD-10-CM | POA: Diagnosis not present

## 2023-03-06 DIAGNOSIS — M86171 Other acute osteomyelitis, right ankle and foot: Secondary | ICD-10-CM | POA: Diagnosis not present

## 2023-03-06 DIAGNOSIS — E785 Hyperlipidemia, unspecified: Secondary | ICD-10-CM | POA: Diagnosis not present

## 2023-03-06 DIAGNOSIS — F32A Depression, unspecified: Secondary | ICD-10-CM | POA: Diagnosis not present

## 2023-03-06 DIAGNOSIS — I2510 Atherosclerotic heart disease of native coronary artery without angina pectoris: Secondary | ICD-10-CM | POA: Diagnosis not present

## 2023-03-06 DIAGNOSIS — H9193 Unspecified hearing loss, bilateral: Secondary | ICD-10-CM | POA: Diagnosis not present

## 2023-03-06 DIAGNOSIS — E871 Hypo-osmolality and hyponatremia: Secondary | ICD-10-CM | POA: Diagnosis not present

## 2023-03-06 DIAGNOSIS — E859 Amyloidosis, unspecified: Secondary | ICD-10-CM | POA: Diagnosis not present

## 2023-03-06 DIAGNOSIS — I252 Old myocardial infarction: Secondary | ICD-10-CM | POA: Diagnosis not present

## 2023-03-06 DIAGNOSIS — G35 Multiple sclerosis: Secondary | ICD-10-CM | POA: Diagnosis not present

## 2023-03-06 DIAGNOSIS — F419 Anxiety disorder, unspecified: Secondary | ICD-10-CM | POA: Diagnosis not present

## 2023-03-06 DIAGNOSIS — N179 Acute kidney failure, unspecified: Secondary | ICD-10-CM | POA: Diagnosis not present

## 2023-03-06 DIAGNOSIS — E1122 Type 2 diabetes mellitus with diabetic chronic kidney disease: Secondary | ICD-10-CM | POA: Diagnosis not present

## 2023-03-06 DIAGNOSIS — E1169 Type 2 diabetes mellitus with other specified complication: Secondary | ICD-10-CM | POA: Diagnosis not present

## 2023-03-06 DIAGNOSIS — Z4781 Encounter for orthopedic aftercare following surgical amputation: Secondary | ICD-10-CM | POA: Diagnosis not present

## 2023-03-06 DIAGNOSIS — N1831 Chronic kidney disease, stage 3a: Secondary | ICD-10-CM | POA: Diagnosis not present

## 2023-03-06 DIAGNOSIS — G8929 Other chronic pain: Secondary | ICD-10-CM | POA: Diagnosis not present

## 2023-03-06 DIAGNOSIS — Z7902 Long term (current) use of antithrombotics/antiplatelets: Secondary | ICD-10-CM | POA: Diagnosis not present

## 2023-03-06 DIAGNOSIS — E1165 Type 2 diabetes mellitus with hyperglycemia: Secondary | ICD-10-CM | POA: Diagnosis not present

## 2023-03-06 DIAGNOSIS — E1140 Type 2 diabetes mellitus with diabetic neuropathy, unspecified: Secondary | ICD-10-CM | POA: Diagnosis not present

## 2023-03-06 DIAGNOSIS — I129 Hypertensive chronic kidney disease with stage 1 through stage 4 chronic kidney disease, or unspecified chronic kidney disease: Secondary | ICD-10-CM | POA: Diagnosis not present

## 2023-03-06 DIAGNOSIS — I359 Nonrheumatic aortic valve disorder, unspecified: Secondary | ICD-10-CM | POA: Diagnosis not present

## 2023-03-06 DIAGNOSIS — Z794 Long term (current) use of insulin: Secondary | ICD-10-CM | POA: Diagnosis not present

## 2023-03-07 DIAGNOSIS — M86172 Other acute osteomyelitis, left ankle and foot: Secondary | ICD-10-CM | POA: Diagnosis not present

## 2023-03-07 DIAGNOSIS — E1140 Type 2 diabetes mellitus with diabetic neuropathy, unspecified: Secondary | ICD-10-CM | POA: Diagnosis not present

## 2023-03-07 DIAGNOSIS — E1169 Type 2 diabetes mellitus with other specified complication: Secondary | ICD-10-CM | POA: Diagnosis not present

## 2023-03-07 DIAGNOSIS — G8929 Other chronic pain: Secondary | ICD-10-CM | POA: Diagnosis not present

## 2023-03-07 DIAGNOSIS — Z4781 Encounter for orthopedic aftercare following surgical amputation: Secondary | ICD-10-CM | POA: Diagnosis not present

## 2023-03-07 DIAGNOSIS — M86171 Other acute osteomyelitis, right ankle and foot: Secondary | ICD-10-CM | POA: Diagnosis not present

## 2023-03-08 ENCOUNTER — Other Ambulatory Visit (HOSPITAL_BASED_OUTPATIENT_CLINIC_OR_DEPARTMENT_OTHER): Payer: Self-pay

## 2023-03-08 ENCOUNTER — Other Ambulatory Visit: Payer: Self-pay

## 2023-03-08 DIAGNOSIS — M79672 Pain in left foot: Secondary | ICD-10-CM | POA: Diagnosis not present

## 2023-03-08 DIAGNOSIS — M79671 Pain in right foot: Secondary | ICD-10-CM | POA: Diagnosis not present

## 2023-03-08 DIAGNOSIS — E1042 Type 1 diabetes mellitus with diabetic polyneuropathy: Secondary | ICD-10-CM | POA: Diagnosis not present

## 2023-03-08 DIAGNOSIS — Z79891 Long term (current) use of opiate analgesic: Secondary | ICD-10-CM | POA: Diagnosis not present

## 2023-03-08 MED ORDER — NUCYNTA ER 200 MG PO TB12
200.0000 mg | ORAL_TABLET | Freq: Two times a day (BID) | ORAL | 0 refills | Status: DC
  Filled 2023-04-06: qty 60, 30d supply, fill #0

## 2023-03-08 MED ORDER — OXYCODONE HCL 15 MG PO TABS
15.0000 mg | ORAL_TABLET | Freq: Four times a day (QID) | ORAL | 0 refills | Status: DC | PRN
  Filled 2023-03-08: qty 120, 30d supply, fill #0

## 2023-03-08 MED ORDER — NUCYNTA ER 200 MG PO TB12
200.0000 mg | ORAL_TABLET | Freq: Two times a day (BID) | ORAL | 0 refills | Status: DC
  Filled 2023-03-08: qty 60, 30d supply, fill #0

## 2023-03-08 MED ORDER — OXYCODONE HCL 15 MG PO TABS
15.0000 mg | ORAL_TABLET | Freq: Four times a day (QID) | ORAL | 0 refills | Status: DC | PRN
  Filled 2023-04-06: qty 120, 30d supply, fill #0

## 2023-03-09 ENCOUNTER — Ambulatory Visit: Payer: Medicare Other | Admitting: Podiatry

## 2023-03-09 DIAGNOSIS — M869 Osteomyelitis, unspecified: Secondary | ICD-10-CM | POA: Diagnosis not present

## 2023-03-09 DIAGNOSIS — Z89412 Acquired absence of left great toe: Secondary | ICD-10-CM

## 2023-03-09 NOTE — Progress Notes (Signed)
  Subjective:  Patient ID: [name] Manis., male    DOB: [birth year],  MRN: [id number]   DOS: 03/01/23 Procedure: Right hallux amputation MPJ level, left 2nd toe amp at base of proximal phalanx.   78 y.o. male seen for post op check. Reports he is doing well, denies pain or other concerns.  Patient has been doing minimal weightbearing with postop shoes.  He is back at home  Review of Systems: Negative except as noted in the HPI. Denies N/V/F/Ch.   Objective:   There were no vitals filed for this visit.  There is no height or weight on file to calculate BMI. Constitutional Well developed. Well nourished.  Vascular Foot warm and well perfused. Capillary refill normal to all digits.   No calf pain with palpation  Neurologic Normal speech. Oriented to person, place, and time. Epicritic sensation Absent to forefoot  Dermatologic Right and left foot amputation sites healing well with no acute concerns.  Minimal eschar but no erythema edema or drainage.  No dehiscence noted  Orthopedic: S/p R hallux amp  and left hallux and 2nd toe amp   Radiographs: disarticulation of right great toe MPJ level, left 2nd toe amp through proximal phalanx base  Pathology: pending  Micro: None taken  Assessment:  8 days s/p R hallux amp and left 2nd toe amp 2/2 osteomyelitis   Plan:  Patient was evaluated and treated and all questions answered.  POD # 8 s/p above procedures, amputation sites viable and healing well -Progressing well post op amputation site healing without evidence of infection or dehiscence -XR: expected post op changes -WB Status: WBAT in bilateral post op shoes -Sutures: remain intact  another 1 to 2 weeks -Medications/ABX: No further antibiotics recommended -Foot redressed.  Gave orders for dressing change once by a home care nurse that they have.  Dressing can be changed once next week and then the patient will follow-up the following week for another dressing change and wound  check.  Potential suture removal at that time.        Corinna Gab, DPM [facility] / [facility]

## 2023-03-12 DIAGNOSIS — G8929 Other chronic pain: Secondary | ICD-10-CM | POA: Diagnosis not present

## 2023-03-12 DIAGNOSIS — M86171 Other acute osteomyelitis, right ankle and foot: Secondary | ICD-10-CM | POA: Diagnosis not present

## 2023-03-12 DIAGNOSIS — M86172 Other acute osteomyelitis, left ankle and foot: Secondary | ICD-10-CM | POA: Diagnosis not present

## 2023-03-12 DIAGNOSIS — Z4781 Encounter for orthopedic aftercare following surgical amputation: Secondary | ICD-10-CM | POA: Diagnosis not present

## 2023-03-12 DIAGNOSIS — E1140 Type 2 diabetes mellitus with diabetic neuropathy, unspecified: Secondary | ICD-10-CM | POA: Diagnosis not present

## 2023-03-12 DIAGNOSIS — E1169 Type 2 diabetes mellitus with other specified complication: Secondary | ICD-10-CM | POA: Diagnosis not present

## 2023-03-13 DIAGNOSIS — E1169 Type 2 diabetes mellitus with other specified complication: Secondary | ICD-10-CM | POA: Diagnosis not present

## 2023-03-13 DIAGNOSIS — E1140 Type 2 diabetes mellitus with diabetic neuropathy, unspecified: Secondary | ICD-10-CM | POA: Diagnosis not present

## 2023-03-13 DIAGNOSIS — G8929 Other chronic pain: Secondary | ICD-10-CM | POA: Diagnosis not present

## 2023-03-13 DIAGNOSIS — M86171 Other acute osteomyelitis, right ankle and foot: Secondary | ICD-10-CM | POA: Diagnosis not present

## 2023-03-13 DIAGNOSIS — Z4781 Encounter for orthopedic aftercare following surgical amputation: Secondary | ICD-10-CM | POA: Diagnosis not present

## 2023-03-13 DIAGNOSIS — M86172 Other acute osteomyelitis, left ankle and foot: Secondary | ICD-10-CM | POA: Diagnosis not present

## 2023-03-14 DIAGNOSIS — G8929 Other chronic pain: Secondary | ICD-10-CM | POA: Diagnosis not present

## 2023-03-14 DIAGNOSIS — M86172 Other acute osteomyelitis, left ankle and foot: Secondary | ICD-10-CM | POA: Diagnosis not present

## 2023-03-14 DIAGNOSIS — M86171 Other acute osteomyelitis, right ankle and foot: Secondary | ICD-10-CM | POA: Diagnosis not present

## 2023-03-14 DIAGNOSIS — E1169 Type 2 diabetes mellitus with other specified complication: Secondary | ICD-10-CM | POA: Diagnosis not present

## 2023-03-14 DIAGNOSIS — E1140 Type 2 diabetes mellitus with diabetic neuropathy, unspecified: Secondary | ICD-10-CM | POA: Diagnosis not present

## 2023-03-14 DIAGNOSIS — Z4781 Encounter for orthopedic aftercare following surgical amputation: Secondary | ICD-10-CM | POA: Diagnosis not present

## 2023-03-16 DIAGNOSIS — M86171 Other acute osteomyelitis, right ankle and foot: Secondary | ICD-10-CM | POA: Diagnosis not present

## 2023-03-16 DIAGNOSIS — G8929 Other chronic pain: Secondary | ICD-10-CM | POA: Diagnosis not present

## 2023-03-16 DIAGNOSIS — Z4781 Encounter for orthopedic aftercare following surgical amputation: Secondary | ICD-10-CM | POA: Diagnosis not present

## 2023-03-16 DIAGNOSIS — M86172 Other acute osteomyelitis, left ankle and foot: Secondary | ICD-10-CM | POA: Diagnosis not present

## 2023-03-16 DIAGNOSIS — E1140 Type 2 diabetes mellitus with diabetic neuropathy, unspecified: Secondary | ICD-10-CM | POA: Diagnosis not present

## 2023-03-16 DIAGNOSIS — E1169 Type 2 diabetes mellitus with other specified complication: Secondary | ICD-10-CM | POA: Diagnosis not present

## 2023-03-20 DIAGNOSIS — L89301 Pressure ulcer of unspecified buttock, stage 1: Secondary | ICD-10-CM | POA: Diagnosis not present

## 2023-03-20 DIAGNOSIS — I129 Hypertensive chronic kidney disease with stage 1 through stage 4 chronic kidney disease, or unspecified chronic kidney disease: Secondary | ICD-10-CM | POA: Diagnosis not present

## 2023-03-20 DIAGNOSIS — I2510 Atherosclerotic heart disease of native coronary artery without angina pectoris: Secondary | ICD-10-CM | POA: Diagnosis not present

## 2023-03-20 DIAGNOSIS — E1029 Type 1 diabetes mellitus with other diabetic kidney complication: Secondary | ICD-10-CM | POA: Diagnosis not present

## 2023-03-20 DIAGNOSIS — M86179 Other acute osteomyelitis, unspecified ankle and foot: Secondary | ICD-10-CM | POA: Diagnosis not present

## 2023-03-20 DIAGNOSIS — N1831 Chronic kidney disease, stage 3a: Secondary | ICD-10-CM | POA: Diagnosis not present

## 2023-03-20 DIAGNOSIS — Z794 Long term (current) use of insulin: Secondary | ICD-10-CM | POA: Diagnosis not present

## 2023-03-20 DIAGNOSIS — S98911A Complete traumatic amputation of right foot, level unspecified, initial encounter: Secondary | ICD-10-CM | POA: Diagnosis not present

## 2023-03-20 DIAGNOSIS — E875 Hyperkalemia: Secondary | ICD-10-CM | POA: Diagnosis not present

## 2023-03-20 DIAGNOSIS — E871 Hypo-osmolality and hyponatremia: Secondary | ICD-10-CM | POA: Diagnosis not present

## 2023-03-20 DIAGNOSIS — E1059 Type 1 diabetes mellitus with other circulatory complications: Secondary | ICD-10-CM | POA: Diagnosis not present

## 2023-03-22 DIAGNOSIS — M86172 Other acute osteomyelitis, left ankle and foot: Secondary | ICD-10-CM | POA: Diagnosis not present

## 2023-03-22 DIAGNOSIS — Z4781 Encounter for orthopedic aftercare following surgical amputation: Secondary | ICD-10-CM | POA: Diagnosis not present

## 2023-03-22 DIAGNOSIS — E1169 Type 2 diabetes mellitus with other specified complication: Secondary | ICD-10-CM | POA: Diagnosis not present

## 2023-03-22 DIAGNOSIS — E1140 Type 2 diabetes mellitus with diabetic neuropathy, unspecified: Secondary | ICD-10-CM | POA: Diagnosis not present

## 2023-03-22 DIAGNOSIS — M86171 Other acute osteomyelitis, right ankle and foot: Secondary | ICD-10-CM | POA: Diagnosis not present

## 2023-03-22 DIAGNOSIS — G8929 Other chronic pain: Secondary | ICD-10-CM | POA: Diagnosis not present

## 2023-03-23 ENCOUNTER — Encounter: Payer: Self-pay | Admitting: Podiatry

## 2023-03-23 ENCOUNTER — Ambulatory Visit (INDEPENDENT_AMBULATORY_CARE_PROVIDER_SITE_OTHER): Payer: Medicare Other | Admitting: Podiatry

## 2023-03-23 DIAGNOSIS — Z89411 Acquired absence of right great toe: Secondary | ICD-10-CM | POA: Diagnosis not present

## 2023-03-23 DIAGNOSIS — Z89412 Acquired absence of left great toe: Secondary | ICD-10-CM

## 2023-03-23 DIAGNOSIS — M86171 Other acute osteomyelitis, right ankle and foot: Secondary | ICD-10-CM | POA: Diagnosis not present

## 2023-03-23 DIAGNOSIS — Z89422 Acquired absence of other left toe(s): Secondary | ICD-10-CM

## 2023-03-23 DIAGNOSIS — Z4781 Encounter for orthopedic aftercare following surgical amputation: Secondary | ICD-10-CM | POA: Diagnosis not present

## 2023-03-23 DIAGNOSIS — E1169 Type 2 diabetes mellitus with other specified complication: Secondary | ICD-10-CM | POA: Diagnosis not present

## 2023-03-23 DIAGNOSIS — E1140 Type 2 diabetes mellitus with diabetic neuropathy, unspecified: Secondary | ICD-10-CM | POA: Diagnosis not present

## 2023-03-23 DIAGNOSIS — G8929 Other chronic pain: Secondary | ICD-10-CM | POA: Diagnosis not present

## 2023-03-23 DIAGNOSIS — M86172 Other acute osteomyelitis, left ankle and foot: Secondary | ICD-10-CM | POA: Diagnosis not present

## 2023-03-23 NOTE — Progress Notes (Signed)
  Subjective:  Patient ID: [name] Manis., male    DOB: [birth year],  MRN: [id number]   DOS: 03/01/23 Procedure: Right hallux amputation MPJ level, left 2nd toe amp at base of proximal phalanx.   78 y.o. male seen for post op check. Reports he is doing well, denies pain or other concerns.  Patient has been doing minimal weightbearing with postop shoes.   Denies pain.  Has had dressings changed once weekly since last appointment.  No concerns did have a nail come off on the left second fourth toe however not causing any problems or draining.  Review of Systems: Negative except as noted in the HPI. Denies N/V/F/Ch.   Objective:   There were no vitals filed for this visit.  There is no height or weight on file to calculate BMI. Constitutional Well developed. Well nourished.  Vascular Foot warm and well perfused. Capillary refill normal to all digits.   No calf pain with palpation  Neurologic Normal speech. Oriented to person, place, and time. Epicritic sensation Absent to forefoot  Dermatologic Right and left foot amputation sites continue to heal with mild to no eschar present.  Appear viable with no drainage erythema edema or dehiscence.   Orthopedic: S/p R hallux amp  and left hallux and 2nd toe amp   Radiographs: disarticulation of right great toe MPJ level, left 2nd toe amp through proximal phalanx base  Pathology: Acute osteomyelitis of right great toe proximal bone margin negative for osteomyelitis.  Left second toe amputation with acute osteomyelitis and proximal bone margin negative for osteomyelitis.  Micro: None taken  Assessment:  3 weeks s/p R hallux amp and left 2nd toe amp 2/2 osteomyelitis   Plan:  Patient was evaluated and treated and all questions answered.  3 weeks s/p above procedures, amputation sites viable and healing well -Progressing well post op amputation site healing without evidence of infection or dehiscence -XR: expected post op changes -WB  Status: WBAT in bilateral post op shoes -Sutures: Removed in total at this visit -Medications/ABX: No further antibiotics recommended -Foot redressed.  Gave orders for dressing change once by a home care nurse that they have.  Dressing can be changed once next week and then the patient will follow-up the following week for another dressing change and wound check.   -Patient to follow-up in 2 to 3 weeks        Corinna Gab, DPM [facility] / [facility]

## 2023-03-27 DIAGNOSIS — G8929 Other chronic pain: Secondary | ICD-10-CM | POA: Diagnosis not present

## 2023-03-27 DIAGNOSIS — Z4781 Encounter for orthopedic aftercare following surgical amputation: Secondary | ICD-10-CM | POA: Diagnosis not present

## 2023-03-27 DIAGNOSIS — E1169 Type 2 diabetes mellitus with other specified complication: Secondary | ICD-10-CM | POA: Diagnosis not present

## 2023-03-27 DIAGNOSIS — E1140 Type 2 diabetes mellitus with diabetic neuropathy, unspecified: Secondary | ICD-10-CM | POA: Diagnosis not present

## 2023-03-27 DIAGNOSIS — M86172 Other acute osteomyelitis, left ankle and foot: Secondary | ICD-10-CM | POA: Diagnosis not present

## 2023-03-27 DIAGNOSIS — M86171 Other acute osteomyelitis, right ankle and foot: Secondary | ICD-10-CM | POA: Diagnosis not present

## 2023-03-28 DIAGNOSIS — E1169 Type 2 diabetes mellitus with other specified complication: Secondary | ICD-10-CM | POA: Diagnosis not present

## 2023-03-28 DIAGNOSIS — M86172 Other acute osteomyelitis, left ankle and foot: Secondary | ICD-10-CM | POA: Diagnosis not present

## 2023-03-28 DIAGNOSIS — E1140 Type 2 diabetes mellitus with diabetic neuropathy, unspecified: Secondary | ICD-10-CM | POA: Diagnosis not present

## 2023-03-28 DIAGNOSIS — G8929 Other chronic pain: Secondary | ICD-10-CM | POA: Diagnosis not present

## 2023-03-28 DIAGNOSIS — M86171 Other acute osteomyelitis, right ankle and foot: Secondary | ICD-10-CM | POA: Diagnosis not present

## 2023-03-28 DIAGNOSIS — Z4781 Encounter for orthopedic aftercare following surgical amputation: Secondary | ICD-10-CM | POA: Diagnosis not present

## 2023-04-02 DIAGNOSIS — Z4781 Encounter for orthopedic aftercare following surgical amputation: Secondary | ICD-10-CM | POA: Diagnosis not present

## 2023-04-02 DIAGNOSIS — G8929 Other chronic pain: Secondary | ICD-10-CM | POA: Diagnosis not present

## 2023-04-02 DIAGNOSIS — E1169 Type 2 diabetes mellitus with other specified complication: Secondary | ICD-10-CM | POA: Diagnosis not present

## 2023-04-02 DIAGNOSIS — E1140 Type 2 diabetes mellitus with diabetic neuropathy, unspecified: Secondary | ICD-10-CM | POA: Diagnosis not present

## 2023-04-02 DIAGNOSIS — M86172 Other acute osteomyelitis, left ankle and foot: Secondary | ICD-10-CM | POA: Diagnosis not present

## 2023-04-02 DIAGNOSIS — M86171 Other acute osteomyelitis, right ankle and foot: Secondary | ICD-10-CM | POA: Diagnosis not present

## 2023-04-03 DIAGNOSIS — M86171 Other acute osteomyelitis, right ankle and foot: Secondary | ICD-10-CM | POA: Diagnosis not present

## 2023-04-03 DIAGNOSIS — E1140 Type 2 diabetes mellitus with diabetic neuropathy, unspecified: Secondary | ICD-10-CM | POA: Diagnosis not present

## 2023-04-03 DIAGNOSIS — M86172 Other acute osteomyelitis, left ankle and foot: Secondary | ICD-10-CM | POA: Diagnosis not present

## 2023-04-03 DIAGNOSIS — G8929 Other chronic pain: Secondary | ICD-10-CM | POA: Diagnosis not present

## 2023-04-03 DIAGNOSIS — E1169 Type 2 diabetes mellitus with other specified complication: Secondary | ICD-10-CM | POA: Diagnosis not present

## 2023-04-03 DIAGNOSIS — Z4781 Encounter for orthopedic aftercare following surgical amputation: Secondary | ICD-10-CM | POA: Diagnosis not present

## 2023-04-03 LAB — ACID FAST CULTURE WITH REFLEXED SENSITIVITIES (MYCOBACTERIA): Acid Fast Culture: NEGATIVE

## 2023-04-04 DIAGNOSIS — Z4781 Encounter for orthopedic aftercare following surgical amputation: Secondary | ICD-10-CM | POA: Diagnosis not present

## 2023-04-04 DIAGNOSIS — E1140 Type 2 diabetes mellitus with diabetic neuropathy, unspecified: Secondary | ICD-10-CM | POA: Diagnosis not present

## 2023-04-04 DIAGNOSIS — M86171 Other acute osteomyelitis, right ankle and foot: Secondary | ICD-10-CM | POA: Diagnosis not present

## 2023-04-04 DIAGNOSIS — G8929 Other chronic pain: Secondary | ICD-10-CM | POA: Diagnosis not present

## 2023-04-04 DIAGNOSIS — M86172 Other acute osteomyelitis, left ankle and foot: Secondary | ICD-10-CM | POA: Diagnosis not present

## 2023-04-04 DIAGNOSIS — E1169 Type 2 diabetes mellitus with other specified complication: Secondary | ICD-10-CM | POA: Diagnosis not present

## 2023-04-05 ENCOUNTER — Other Ambulatory Visit (HOSPITAL_BASED_OUTPATIENT_CLINIC_OR_DEPARTMENT_OTHER): Payer: Self-pay

## 2023-04-05 DIAGNOSIS — Z4781 Encounter for orthopedic aftercare following surgical amputation: Secondary | ICD-10-CM | POA: Diagnosis not present

## 2023-04-05 DIAGNOSIS — I129 Hypertensive chronic kidney disease with stage 1 through stage 4 chronic kidney disease, or unspecified chronic kidney disease: Secondary | ICD-10-CM | POA: Diagnosis not present

## 2023-04-05 DIAGNOSIS — M1990 Unspecified osteoarthritis, unspecified site: Secondary | ICD-10-CM | POA: Diagnosis not present

## 2023-04-05 DIAGNOSIS — M86172 Other acute osteomyelitis, left ankle and foot: Secondary | ICD-10-CM | POA: Diagnosis not present

## 2023-04-05 DIAGNOSIS — G8929 Other chronic pain: Secondary | ICD-10-CM | POA: Diagnosis not present

## 2023-04-05 DIAGNOSIS — I252 Old myocardial infarction: Secondary | ICD-10-CM | POA: Diagnosis not present

## 2023-04-05 DIAGNOSIS — Z794 Long term (current) use of insulin: Secondary | ICD-10-CM | POA: Diagnosis not present

## 2023-04-05 DIAGNOSIS — Z7902 Long term (current) use of antithrombotics/antiplatelets: Secondary | ICD-10-CM | POA: Diagnosis not present

## 2023-04-05 DIAGNOSIS — R0902 Hypoxemia: Secondary | ICD-10-CM | POA: Diagnosis not present

## 2023-04-05 DIAGNOSIS — H9193 Unspecified hearing loss, bilateral: Secondary | ICD-10-CM | POA: Diagnosis not present

## 2023-04-05 DIAGNOSIS — F32A Depression, unspecified: Secondary | ICD-10-CM | POA: Diagnosis not present

## 2023-04-05 DIAGNOSIS — E1059 Type 1 diabetes mellitus with other circulatory complications: Secondary | ICD-10-CM | POA: Diagnosis not present

## 2023-04-05 DIAGNOSIS — E871 Hypo-osmolality and hyponatremia: Secondary | ICD-10-CM | POA: Diagnosis not present

## 2023-04-05 DIAGNOSIS — F419 Anxiety disorder, unspecified: Secondary | ICD-10-CM | POA: Diagnosis not present

## 2023-04-05 DIAGNOSIS — R42 Dizziness and giddiness: Secondary | ICD-10-CM | POA: Diagnosis not present

## 2023-04-05 DIAGNOSIS — I359 Nonrheumatic aortic valve disorder, unspecified: Secondary | ICD-10-CM | POA: Diagnosis not present

## 2023-04-05 DIAGNOSIS — G894 Chronic pain syndrome: Secondary | ICD-10-CM | POA: Diagnosis not present

## 2023-04-05 DIAGNOSIS — E1165 Type 2 diabetes mellitus with hyperglycemia: Secondary | ICD-10-CM | POA: Diagnosis not present

## 2023-04-05 DIAGNOSIS — I2510 Atherosclerotic heart disease of native coronary artery without angina pectoris: Secondary | ICD-10-CM | POA: Diagnosis not present

## 2023-04-05 DIAGNOSIS — E1169 Type 2 diabetes mellitus with other specified complication: Secondary | ICD-10-CM | POA: Diagnosis not present

## 2023-04-05 DIAGNOSIS — R410 Disorientation, unspecified: Secondary | ICD-10-CM | POA: Diagnosis not present

## 2023-04-05 DIAGNOSIS — M86171 Other acute osteomyelitis, right ankle and foot: Secondary | ICD-10-CM | POA: Diagnosis not present

## 2023-04-05 DIAGNOSIS — N1831 Chronic kidney disease, stage 3a: Secondary | ICD-10-CM | POA: Diagnosis not present

## 2023-04-05 DIAGNOSIS — E1029 Type 1 diabetes mellitus with other diabetic kidney complication: Secondary | ICD-10-CM | POA: Diagnosis not present

## 2023-04-05 DIAGNOSIS — E875 Hyperkalemia: Secondary | ICD-10-CM | POA: Diagnosis not present

## 2023-04-05 DIAGNOSIS — E1140 Type 2 diabetes mellitus with diabetic neuropathy, unspecified: Secondary | ICD-10-CM | POA: Diagnosis not present

## 2023-04-05 DIAGNOSIS — E1122 Type 2 diabetes mellitus with diabetic chronic kidney disease: Secondary | ICD-10-CM | POA: Diagnosis not present

## 2023-04-05 DIAGNOSIS — E859 Amyloidosis, unspecified: Secondary | ICD-10-CM | POA: Diagnosis not present

## 2023-04-05 DIAGNOSIS — E785 Hyperlipidemia, unspecified: Secondary | ICD-10-CM | POA: Diagnosis not present

## 2023-04-05 DIAGNOSIS — N179 Acute kidney failure, unspecified: Secondary | ICD-10-CM | POA: Diagnosis not present

## 2023-04-05 DIAGNOSIS — G35 Multiple sclerosis: Secondary | ICD-10-CM | POA: Diagnosis not present

## 2023-04-06 ENCOUNTER — Other Ambulatory Visit (HOSPITAL_BASED_OUTPATIENT_CLINIC_OR_DEPARTMENT_OTHER): Payer: Self-pay

## 2023-04-06 ENCOUNTER — Encounter (HOSPITAL_BASED_OUTPATIENT_CLINIC_OR_DEPARTMENT_OTHER): Payer: Self-pay | Admitting: Pharmacist

## 2023-04-07 ENCOUNTER — Other Ambulatory Visit (HOSPITAL_BASED_OUTPATIENT_CLINIC_OR_DEPARTMENT_OTHER): Payer: Self-pay

## 2023-04-07 MED ORDER — LOKELMA 10 G PO PACK
1.0000 | PACK | Freq: Three times a day (TID) | ORAL | 0 refills | Status: AC
  Filled 2023-04-07: qty 6, 2d supply, fill #0

## 2023-04-10 DIAGNOSIS — E1140 Type 2 diabetes mellitus with diabetic neuropathy, unspecified: Secondary | ICD-10-CM | POA: Diagnosis not present

## 2023-04-10 DIAGNOSIS — M86172 Other acute osteomyelitis, left ankle and foot: Secondary | ICD-10-CM | POA: Diagnosis not present

## 2023-04-10 DIAGNOSIS — M86171 Other acute osteomyelitis, right ankle and foot: Secondary | ICD-10-CM | POA: Diagnosis not present

## 2023-04-10 DIAGNOSIS — G8929 Other chronic pain: Secondary | ICD-10-CM | POA: Diagnosis not present

## 2023-04-10 DIAGNOSIS — Z4781 Encounter for orthopedic aftercare following surgical amputation: Secondary | ICD-10-CM | POA: Diagnosis not present

## 2023-04-10 DIAGNOSIS — E1169 Type 2 diabetes mellitus with other specified complication: Secondary | ICD-10-CM | POA: Diagnosis not present

## 2023-04-11 ENCOUNTER — Ambulatory Visit (INDEPENDENT_AMBULATORY_CARE_PROVIDER_SITE_OTHER): Payer: Medicare Other | Admitting: Podiatry

## 2023-04-11 DIAGNOSIS — Z89412 Acquired absence of left great toe: Secondary | ICD-10-CM | POA: Diagnosis not present

## 2023-04-11 DIAGNOSIS — Z89411 Acquired absence of right great toe: Secondary | ICD-10-CM

## 2023-04-11 DIAGNOSIS — Z89422 Acquired absence of other left toe(s): Secondary | ICD-10-CM | POA: Diagnosis not present

## 2023-04-11 NOTE — Progress Notes (Signed)
  Subjective:  Patient ID: [name] Manis., [name]    DOB: 06-Feb-1946,  MRN: [id number]   DOS: 03/01/23 Procedure: Right hallux amputation MPJ level, left 2nd toe amp at base of proximal phalanx.   78 y.o. [name] seen for post op check.  Overall reports doing well has been getting dressing changes as per prior instruction no concerns  Review of Systems: Negative except as noted in the HPI. Denies N/V/F/Ch.   Objective:   There were no vitals filed for this visit.  There is no height or weight on file to calculate BMI. Constitutional Well developed. Well nourished.  Vascular Foot warm and well perfused. Capillary refill normal to all digits.   No calf pain with palpation  Neurologic Normal speech. Oriented to person, place, and time. Epicritic sensation Absent to forefoot  Dermatologic Right and left foot amputation sites continue to heal left second toe amputation nearly completely healed at this time right great toe amputation site with increased eschar though no evidence of frank necrosis or dehiscence.Marland Kitchen  Appear viable with no drainage erythema edema or dehiscence.   Orthopedic: S/p R hallux amp  and left hallux and 2nd toe amp   Radiographs: disarticulation of right great toe MPJ level, left 2nd toe amp through proximal phalanx base  Pathology: Acute osteomyelitis of right great toe proximal bone margin negative for osteomyelitis.  Left second toe amputation with acute osteomyelitis and proximal bone margin negative for osteomyelitis.  Micro: None taken  Assessment:  6 weeks s/p R hallux amp and left 2nd toe amp 2/2 osteomyelitis   Plan:  Patient was evaluated and treated and all questions answered.  6 weeks s/p above procedures, amputation sites viable and healing well -Progressing well post op amputation site healing without evidence of infection or dehiscence -XR: expected post op changes -WB Status: WBAT in bilateral post op shoes -Sutures: Previously  removed -Medications/ABX: No further antibiotics recommended -Foot redressed.  Continue home health care nurse dressing changes twice weekly -Patient to follow-up in 4 weeks        Corinna Gab, DPM [facility] / [facility]

## 2023-04-13 DIAGNOSIS — G4733 Obstructive sleep apnea (adult) (pediatric): Secondary | ICD-10-CM | POA: Diagnosis not present

## 2023-04-13 DIAGNOSIS — R0602 Shortness of breath: Secondary | ICD-10-CM | POA: Diagnosis not present

## 2023-04-14 DIAGNOSIS — G8929 Other chronic pain: Secondary | ICD-10-CM | POA: Diagnosis not present

## 2023-04-14 DIAGNOSIS — M86172 Other acute osteomyelitis, left ankle and foot: Secondary | ICD-10-CM | POA: Diagnosis not present

## 2023-04-14 DIAGNOSIS — M86171 Other acute osteomyelitis, right ankle and foot: Secondary | ICD-10-CM | POA: Diagnosis not present

## 2023-04-14 DIAGNOSIS — Z4781 Encounter for orthopedic aftercare following surgical amputation: Secondary | ICD-10-CM | POA: Diagnosis not present

## 2023-04-14 DIAGNOSIS — E1169 Type 2 diabetes mellitus with other specified complication: Secondary | ICD-10-CM | POA: Diagnosis not present

## 2023-04-14 DIAGNOSIS — E1140 Type 2 diabetes mellitus with diabetic neuropathy, unspecified: Secondary | ICD-10-CM | POA: Diagnosis not present

## 2023-04-14 DIAGNOSIS — G4733 Obstructive sleep apnea (adult) (pediatric): Secondary | ICD-10-CM | POA: Diagnosis not present

## 2023-04-14 DIAGNOSIS — R0602 Shortness of breath: Secondary | ICD-10-CM | POA: Diagnosis not present

## 2023-04-20 DIAGNOSIS — E1169 Type 2 diabetes mellitus with other specified complication: Secondary | ICD-10-CM | POA: Diagnosis not present

## 2023-04-20 DIAGNOSIS — G8929 Other chronic pain: Secondary | ICD-10-CM | POA: Diagnosis not present

## 2023-04-20 DIAGNOSIS — M86172 Other acute osteomyelitis, left ankle and foot: Secondary | ICD-10-CM | POA: Diagnosis not present

## 2023-04-20 DIAGNOSIS — Z4781 Encounter for orthopedic aftercare following surgical amputation: Secondary | ICD-10-CM | POA: Diagnosis not present

## 2023-04-20 DIAGNOSIS — M86171 Other acute osteomyelitis, right ankle and foot: Secondary | ICD-10-CM | POA: Diagnosis not present

## 2023-04-20 DIAGNOSIS — E1140 Type 2 diabetes mellitus with diabetic neuropathy, unspecified: Secondary | ICD-10-CM | POA: Diagnosis not present

## 2023-04-21 DIAGNOSIS — E1169 Type 2 diabetes mellitus with other specified complication: Secondary | ICD-10-CM | POA: Diagnosis not present

## 2023-04-21 DIAGNOSIS — Z4781 Encounter for orthopedic aftercare following surgical amputation: Secondary | ICD-10-CM | POA: Diagnosis not present

## 2023-04-21 DIAGNOSIS — G8929 Other chronic pain: Secondary | ICD-10-CM | POA: Diagnosis not present

## 2023-04-21 DIAGNOSIS — M86172 Other acute osteomyelitis, left ankle and foot: Secondary | ICD-10-CM | POA: Diagnosis not present

## 2023-04-21 DIAGNOSIS — E1140 Type 2 diabetes mellitus with diabetic neuropathy, unspecified: Secondary | ICD-10-CM | POA: Diagnosis not present

## 2023-04-21 DIAGNOSIS — M86171 Other acute osteomyelitis, right ankle and foot: Secondary | ICD-10-CM | POA: Diagnosis not present

## 2023-05-01 DIAGNOSIS — E1140 Type 2 diabetes mellitus with diabetic neuropathy, unspecified: Secondary | ICD-10-CM | POA: Diagnosis not present

## 2023-05-01 DIAGNOSIS — M86171 Other acute osteomyelitis, right ankle and foot: Secondary | ICD-10-CM | POA: Diagnosis not present

## 2023-05-01 DIAGNOSIS — E1169 Type 2 diabetes mellitus with other specified complication: Secondary | ICD-10-CM | POA: Diagnosis not present

## 2023-05-01 DIAGNOSIS — Z4781 Encounter for orthopedic aftercare following surgical amputation: Secondary | ICD-10-CM | POA: Diagnosis not present

## 2023-05-01 DIAGNOSIS — M86172 Other acute osteomyelitis, left ankle and foot: Secondary | ICD-10-CM | POA: Diagnosis not present

## 2023-05-01 DIAGNOSIS — G8929 Other chronic pain: Secondary | ICD-10-CM | POA: Diagnosis not present

## 2023-05-02 DIAGNOSIS — M86172 Other acute osteomyelitis, left ankle and foot: Secondary | ICD-10-CM | POA: Diagnosis not present

## 2023-05-02 DIAGNOSIS — G8929 Other chronic pain: Secondary | ICD-10-CM | POA: Diagnosis not present

## 2023-05-02 DIAGNOSIS — E1169 Type 2 diabetes mellitus with other specified complication: Secondary | ICD-10-CM | POA: Diagnosis not present

## 2023-05-02 DIAGNOSIS — Z4781 Encounter for orthopedic aftercare following surgical amputation: Secondary | ICD-10-CM | POA: Diagnosis not present

## 2023-05-02 DIAGNOSIS — E1140 Type 2 diabetes mellitus with diabetic neuropathy, unspecified: Secondary | ICD-10-CM | POA: Diagnosis not present

## 2023-05-02 DIAGNOSIS — M86171 Other acute osteomyelitis, right ankle and foot: Secondary | ICD-10-CM | POA: Diagnosis not present

## 2023-05-04 ENCOUNTER — Other Ambulatory Visit (HOSPITAL_BASED_OUTPATIENT_CLINIC_OR_DEPARTMENT_OTHER): Payer: Self-pay

## 2023-05-04 DIAGNOSIS — Z4781 Encounter for orthopedic aftercare following surgical amputation: Secondary | ICD-10-CM | POA: Diagnosis not present

## 2023-05-04 DIAGNOSIS — E1169 Type 2 diabetes mellitus with other specified complication: Secondary | ICD-10-CM | POA: Diagnosis not present

## 2023-05-04 DIAGNOSIS — G8929 Other chronic pain: Secondary | ICD-10-CM | POA: Diagnosis not present

## 2023-05-04 DIAGNOSIS — Z79891 Long term (current) use of opiate analgesic: Secondary | ICD-10-CM | POA: Diagnosis not present

## 2023-05-04 DIAGNOSIS — M79672 Pain in left foot: Secondary | ICD-10-CM | POA: Diagnosis not present

## 2023-05-04 DIAGNOSIS — E1042 Type 1 diabetes mellitus with diabetic polyneuropathy: Secondary | ICD-10-CM | POA: Diagnosis not present

## 2023-05-04 DIAGNOSIS — M86172 Other acute osteomyelitis, left ankle and foot: Secondary | ICD-10-CM | POA: Diagnosis not present

## 2023-05-04 DIAGNOSIS — E1140 Type 2 diabetes mellitus with diabetic neuropathy, unspecified: Secondary | ICD-10-CM | POA: Diagnosis not present

## 2023-05-04 DIAGNOSIS — M79671 Pain in right foot: Secondary | ICD-10-CM | POA: Diagnosis not present

## 2023-05-04 DIAGNOSIS — M86171 Other acute osteomyelitis, right ankle and foot: Secondary | ICD-10-CM | POA: Diagnosis not present

## 2023-05-04 MED ORDER — OXYCODONE HCL 15 MG PO TABS
15.0000 mg | ORAL_TABLET | Freq: Four times a day (QID) | ORAL | 0 refills | Status: DC | PRN
Start: 2023-05-04 — End: 2023-05-24
  Filled 2023-05-04: qty 120, 30d supply, fill #0

## 2023-05-04 MED ORDER — NUCYNTA ER 200 MG PO TB12
200.0000 mg | ORAL_TABLET | Freq: Two times a day (BID) | ORAL | 0 refills | Status: DC
  Filled 2023-05-04: qty 60, 30d supply, fill #0

## 2023-05-05 ENCOUNTER — Other Ambulatory Visit (HOSPITAL_BASED_OUTPATIENT_CLINIC_OR_DEPARTMENT_OTHER): Payer: Self-pay

## 2023-05-05 DIAGNOSIS — Z89411 Acquired absence of right great toe: Secondary | ICD-10-CM | POA: Diagnosis not present

## 2023-05-05 DIAGNOSIS — E1165 Type 2 diabetes mellitus with hyperglycemia: Secondary | ICD-10-CM | POA: Diagnosis not present

## 2023-05-05 DIAGNOSIS — I2510 Atherosclerotic heart disease of native coronary artery without angina pectoris: Secondary | ICD-10-CM | POA: Diagnosis not present

## 2023-05-05 DIAGNOSIS — H9193 Unspecified hearing loss, bilateral: Secondary | ICD-10-CM | POA: Diagnosis not present

## 2023-05-05 DIAGNOSIS — Z7902 Long term (current) use of antithrombotics/antiplatelets: Secondary | ICD-10-CM | POA: Diagnosis not present

## 2023-05-05 DIAGNOSIS — I129 Hypertensive chronic kidney disease with stage 1 through stage 4 chronic kidney disease, or unspecified chronic kidney disease: Secondary | ICD-10-CM | POA: Diagnosis not present

## 2023-05-05 DIAGNOSIS — M86172 Other acute osteomyelitis, left ankle and foot: Secondary | ICD-10-CM | POA: Diagnosis not present

## 2023-05-05 DIAGNOSIS — G8929 Other chronic pain: Secondary | ICD-10-CM | POA: Diagnosis not present

## 2023-05-05 DIAGNOSIS — M1990 Unspecified osteoarthritis, unspecified site: Secondary | ICD-10-CM | POA: Diagnosis not present

## 2023-05-05 DIAGNOSIS — E785 Hyperlipidemia, unspecified: Secondary | ICD-10-CM | POA: Diagnosis not present

## 2023-05-05 DIAGNOSIS — F419 Anxiety disorder, unspecified: Secondary | ICD-10-CM | POA: Diagnosis not present

## 2023-05-05 DIAGNOSIS — M86171 Other acute osteomyelitis, right ankle and foot: Secondary | ICD-10-CM | POA: Diagnosis not present

## 2023-05-05 DIAGNOSIS — I359 Nonrheumatic aortic valve disorder, unspecified: Secondary | ICD-10-CM | POA: Diagnosis not present

## 2023-05-05 DIAGNOSIS — E1169 Type 2 diabetes mellitus with other specified complication: Secondary | ICD-10-CM | POA: Diagnosis not present

## 2023-05-05 DIAGNOSIS — G35 Multiple sclerosis: Secondary | ICD-10-CM | POA: Diagnosis not present

## 2023-05-05 DIAGNOSIS — E1122 Type 2 diabetes mellitus with diabetic chronic kidney disease: Secondary | ICD-10-CM | POA: Diagnosis not present

## 2023-05-05 DIAGNOSIS — Z792 Long term (current) use of antibiotics: Secondary | ICD-10-CM | POA: Diagnosis not present

## 2023-05-05 DIAGNOSIS — E1140 Type 2 diabetes mellitus with diabetic neuropathy, unspecified: Secondary | ICD-10-CM | POA: Diagnosis not present

## 2023-05-05 DIAGNOSIS — F32A Depression, unspecified: Secondary | ICD-10-CM | POA: Diagnosis not present

## 2023-05-05 DIAGNOSIS — Z9981 Dependence on supplemental oxygen: Secondary | ICD-10-CM | POA: Diagnosis not present

## 2023-05-05 DIAGNOSIS — Z89412 Acquired absence of left great toe: Secondary | ICD-10-CM | POA: Diagnosis not present

## 2023-05-05 DIAGNOSIS — E859 Amyloidosis, unspecified: Secondary | ICD-10-CM | POA: Diagnosis not present

## 2023-05-05 DIAGNOSIS — I252 Old myocardial infarction: Secondary | ICD-10-CM | POA: Diagnosis not present

## 2023-05-05 DIAGNOSIS — N1831 Chronic kidney disease, stage 3a: Secondary | ICD-10-CM | POA: Diagnosis not present

## 2023-05-05 DIAGNOSIS — Z794 Long term (current) use of insulin: Secondary | ICD-10-CM | POA: Diagnosis not present

## 2023-05-09 DIAGNOSIS — N1831 Chronic kidney disease, stage 3a: Secondary | ICD-10-CM | POA: Diagnosis not present

## 2023-05-09 DIAGNOSIS — M86172 Other acute osteomyelitis, left ankle and foot: Secondary | ICD-10-CM | POA: Diagnosis not present

## 2023-05-09 DIAGNOSIS — M86171 Other acute osteomyelitis, right ankle and foot: Secondary | ICD-10-CM | POA: Diagnosis not present

## 2023-05-09 DIAGNOSIS — I129 Hypertensive chronic kidney disease with stage 1 through stage 4 chronic kidney disease, or unspecified chronic kidney disease: Secondary | ICD-10-CM | POA: Diagnosis not present

## 2023-05-09 DIAGNOSIS — E1122 Type 2 diabetes mellitus with diabetic chronic kidney disease: Secondary | ICD-10-CM | POA: Diagnosis not present

## 2023-05-09 DIAGNOSIS — E1169 Type 2 diabetes mellitus with other specified complication: Secondary | ICD-10-CM | POA: Diagnosis not present

## 2023-05-15 DIAGNOSIS — G894 Chronic pain syndrome: Secondary | ICD-10-CM | POA: Diagnosis not present

## 2023-05-15 DIAGNOSIS — Z79891 Long term (current) use of opiate analgesic: Secondary | ICD-10-CM | POA: Diagnosis not present

## 2023-05-16 ENCOUNTER — Ambulatory Visit (INDEPENDENT_AMBULATORY_CARE_PROVIDER_SITE_OTHER): Admitting: Podiatry

## 2023-05-16 ENCOUNTER — Encounter: Payer: Self-pay | Admitting: Podiatry

## 2023-05-16 VITALS — Ht 72.0 in | Wt 196.4 lb

## 2023-05-16 DIAGNOSIS — Z89412 Acquired absence of left great toe: Secondary | ICD-10-CM

## 2023-05-16 DIAGNOSIS — Z89411 Acquired absence of right great toe: Secondary | ICD-10-CM | POA: Diagnosis not present

## 2023-05-16 NOTE — Progress Notes (Signed)
  Subjective:  Patient ID: [name], male    DOB: 10-Aug-1945,  MRN: [id number]   DOS: 03/01/23 Procedure: Right hallux amputation MPJ level, left 2nd toe amp at base of proximal phalanx.   78 y.o. male seen for post op check.   Denies issues. Has been putting betadine on the amp site. Not yet gotten feet wet.   Review of Systems: Negative except as noted in the HPI. Denies N/V/F/Ch.   Objective:   There were no vitals filed for this visit.  Body mass index is 26.64 kg/m. Constitutional Well developed. Well nourished.  Vascular Foot warm and well perfused. Capillary refill normal to all digits.   No calf pain with palpation  Neurologic Normal speech. Oriented to person, place, and time. Epicritic sensation Absent to forefoot  Dermatologic Right and left foot amputation sites  well healed with loose eschar with healthy skin underlying, no open wound erythema or drainage.   Orthopedic: S/p R hallux amp  and left hallux and 2nd toe amp   Radiographs: disarticulation of right great toe MPJ level, left 2nd toe amp through proximal phalanx base  Pathology: Acute osteomyelitis of right great toe proximal bone margin negative for osteomyelitis.  Left second toe amputation with acute osteomyelitis and proximal bone margin negative for osteomyelitis.  Micro: None taken  Assessment:  10 weeks s/p R hallux amp and left 2nd toe amp 2/2 osteomyelitis   Plan:  Patient was evaluated and treated and all questions answered.  10 weeks s/p above procedures, amputation sites viable and healing well -Progressing well post op amputation site well healed at this time.  -Debrided remaining nails today as a courtesy. Follow up 3 mo with Dr. Stacie Acres for ongoing routine DM care.  -XR: expected post op changes -WB Status: WBAT in bilateral post op shoes -Sutures: Previously removed -Medications/ABX: No further antibiotics recommended -Foot redressed.   - Ok to wash feet with warm soapy water.  Abx ointment and band aid to any scabs at amp site or toes.  - Follow up with pedorthist for DM shoes/Liners          Corinna Gab, DPM [facility] / [facility]

## 2023-05-17 DIAGNOSIS — E1122 Type 2 diabetes mellitus with diabetic chronic kidney disease: Secondary | ICD-10-CM | POA: Diagnosis not present

## 2023-05-17 DIAGNOSIS — I129 Hypertensive chronic kidney disease with stage 1 through stage 4 chronic kidney disease, or unspecified chronic kidney disease: Secondary | ICD-10-CM | POA: Diagnosis not present

## 2023-05-17 DIAGNOSIS — N1831 Chronic kidney disease, stage 3a: Secondary | ICD-10-CM | POA: Diagnosis not present

## 2023-05-17 DIAGNOSIS — M86172 Other acute osteomyelitis, left ankle and foot: Secondary | ICD-10-CM | POA: Diagnosis not present

## 2023-05-17 DIAGNOSIS — E1169 Type 2 diabetes mellitus with other specified complication: Secondary | ICD-10-CM | POA: Diagnosis not present

## 2023-05-17 DIAGNOSIS — M86171 Other acute osteomyelitis, right ankle and foot: Secondary | ICD-10-CM | POA: Diagnosis not present

## 2023-05-23 DIAGNOSIS — M86172 Other acute osteomyelitis, left ankle and foot: Secondary | ICD-10-CM | POA: Diagnosis not present

## 2023-05-23 DIAGNOSIS — M86171 Other acute osteomyelitis, right ankle and foot: Secondary | ICD-10-CM | POA: Diagnosis not present

## 2023-05-23 DIAGNOSIS — E1169 Type 2 diabetes mellitus with other specified complication: Secondary | ICD-10-CM | POA: Diagnosis not present

## 2023-05-23 DIAGNOSIS — E1122 Type 2 diabetes mellitus with diabetic chronic kidney disease: Secondary | ICD-10-CM | POA: Diagnosis not present

## 2023-05-23 DIAGNOSIS — N1831 Chronic kidney disease, stage 3a: Secondary | ICD-10-CM | POA: Diagnosis not present

## 2023-05-23 DIAGNOSIS — I129 Hypertensive chronic kidney disease with stage 1 through stage 4 chronic kidney disease, or unspecified chronic kidney disease: Secondary | ICD-10-CM | POA: Diagnosis not present

## 2023-05-24 ENCOUNTER — Other Ambulatory Visit (HOSPITAL_COMMUNITY): Payer: Self-pay

## 2023-05-24 ENCOUNTER — Other Ambulatory Visit (HOSPITAL_BASED_OUTPATIENT_CLINIC_OR_DEPARTMENT_OTHER): Payer: Self-pay

## 2023-05-24 DIAGNOSIS — I129 Hypertensive chronic kidney disease with stage 1 through stage 4 chronic kidney disease, or unspecified chronic kidney disease: Secondary | ICD-10-CM | POA: Diagnosis not present

## 2023-05-24 DIAGNOSIS — L89322 Pressure ulcer of left buttock, stage 2: Secondary | ICD-10-CM | POA: Diagnosis not present

## 2023-05-24 DIAGNOSIS — N1831 Chronic kidney disease, stage 3a: Secondary | ICD-10-CM | POA: Diagnosis not present

## 2023-05-24 DIAGNOSIS — G894 Chronic pain syndrome: Secondary | ICD-10-CM | POA: Diagnosis not present

## 2023-05-24 DIAGNOSIS — G35 Multiple sclerosis: Secondary | ICD-10-CM | POA: Diagnosis not present

## 2023-05-24 DIAGNOSIS — Z794 Long term (current) use of insulin: Secondary | ICD-10-CM | POA: Diagnosis not present

## 2023-05-24 DIAGNOSIS — J449 Chronic obstructive pulmonary disease, unspecified: Secondary | ICD-10-CM | POA: Diagnosis not present

## 2023-05-24 DIAGNOSIS — G629 Polyneuropathy, unspecified: Secondary | ICD-10-CM | POA: Diagnosis not present

## 2023-05-24 DIAGNOSIS — E785 Hyperlipidemia, unspecified: Secondary | ICD-10-CM | POA: Diagnosis not present

## 2023-05-24 DIAGNOSIS — E1029 Type 1 diabetes mellitus with other diabetic kidney complication: Secondary | ICD-10-CM | POA: Diagnosis not present

## 2023-05-24 MED ORDER — NUCYNTA ER 200 MG PO TB12
1.0000 | ORAL_TABLET | Freq: Two times a day (BID) | ORAL | 0 refills | Status: DC
  Filled 2023-05-24 – 2023-06-05 (×2): qty 60, 30d supply, fill #0

## 2023-05-24 MED ORDER — OXYCODONE HCL 15 MG PO TABS
15.0000 mg | ORAL_TABLET | Freq: Four times a day (QID) | ORAL | 0 refills | Status: DC | PRN
  Filled 2023-05-24: qty 120, 30d supply, fill #0
  Filled 2023-06-05: qty 100, 25d supply, fill #0
  Filled 2023-06-05: qty 20, 5d supply, fill #0

## 2023-05-29 DIAGNOSIS — H40013 Open angle with borderline findings, low risk, bilateral: Secondary | ICD-10-CM | POA: Diagnosis not present

## 2023-05-29 DIAGNOSIS — H52203 Unspecified astigmatism, bilateral: Secondary | ICD-10-CM | POA: Diagnosis not present

## 2023-05-29 DIAGNOSIS — E119 Type 2 diabetes mellitus without complications: Secondary | ICD-10-CM | POA: Diagnosis not present

## 2023-05-29 DIAGNOSIS — H401230 Low-tension glaucoma, bilateral, stage unspecified: Secondary | ICD-10-CM | POA: Diagnosis not present

## 2023-05-29 DIAGNOSIS — Z961 Presence of intraocular lens: Secondary | ICD-10-CM | POA: Diagnosis not present

## 2023-05-29 DIAGNOSIS — H353133 Nonexudative age-related macular degeneration, bilateral, advanced atrophic without subfoveal involvement: Secondary | ICD-10-CM | POA: Diagnosis not present

## 2023-06-05 ENCOUNTER — Other Ambulatory Visit (HOSPITAL_BASED_OUTPATIENT_CLINIC_OR_DEPARTMENT_OTHER): Payer: Self-pay

## 2023-06-06 ENCOUNTER — Other Ambulatory Visit (HOSPITAL_BASED_OUTPATIENT_CLINIC_OR_DEPARTMENT_OTHER): Payer: Self-pay

## 2023-06-20 ENCOUNTER — Encounter (HOSPITAL_COMMUNITY): Payer: Self-pay

## 2023-06-20 ENCOUNTER — Encounter: Payer: Self-pay | Admitting: Family Medicine

## 2023-06-20 ENCOUNTER — Ambulatory Visit (INDEPENDENT_AMBULATORY_CARE_PROVIDER_SITE_OTHER): Admitting: Podiatry

## 2023-06-20 ENCOUNTER — Encounter: Payer: Self-pay | Admitting: Podiatry

## 2023-06-20 ENCOUNTER — Encounter (HOSPITAL_COMMUNITY): Payer: Self-pay | Admitting: Internal Medicine

## 2023-06-20 ENCOUNTER — Telehealth: Payer: Self-pay

## 2023-06-20 ENCOUNTER — Other Ambulatory Visit: Payer: Self-pay

## 2023-06-20 ENCOUNTER — Inpatient Hospital Stay (HOSPITAL_COMMUNITY)

## 2023-06-20 ENCOUNTER — Inpatient Hospital Stay (HOSPITAL_COMMUNITY)
Admission: AD | Admit: 2023-06-20 | Discharge: 2023-06-23 | DRG: 617 | Disposition: A | Discharge status: Home / Self Care | Source: Ambulatory Visit | Attending: Internal Medicine | Admitting: Internal Medicine

## 2023-06-20 DIAGNOSIS — Z66 Do not resuscitate: Secondary | ICD-10-CM | POA: Diagnosis present

## 2023-06-20 DIAGNOSIS — Z955 Presence of coronary angioplasty implant and graft: Secondary | ICD-10-CM

## 2023-06-20 DIAGNOSIS — M869 Osteomyelitis, unspecified: Principal | ICD-10-CM | POA: Diagnosis present

## 2023-06-20 DIAGNOSIS — M85872 Other specified disorders of bone density and structure, left ankle and foot: Secondary | ICD-10-CM | POA: Diagnosis not present

## 2023-06-20 DIAGNOSIS — E10649 Type 1 diabetes mellitus with hypoglycemia without coma: Secondary | ICD-10-CM | POA: Diagnosis not present

## 2023-06-20 DIAGNOSIS — I129 Hypertensive chronic kidney disease with stage 1 through stage 4 chronic kidney disease, or unspecified chronic kidney disease: Secondary | ICD-10-CM | POA: Diagnosis not present

## 2023-06-20 DIAGNOSIS — G894 Chronic pain syndrome: Secondary | ICD-10-CM | POA: Diagnosis present

## 2023-06-20 DIAGNOSIS — E1042 Type 1 diabetes mellitus with diabetic polyneuropathy: Secondary | ICD-10-CM | POA: Diagnosis present

## 2023-06-20 DIAGNOSIS — L03116 Cellulitis of left lower limb: Secondary | ICD-10-CM | POA: Diagnosis present

## 2023-06-20 DIAGNOSIS — Z9981 Dependence on supplemental oxygen: Secondary | ICD-10-CM | POA: Diagnosis not present

## 2023-06-20 DIAGNOSIS — M6289 Other specified disorders of muscle: Secondary | ICD-10-CM | POA: Diagnosis not present

## 2023-06-20 DIAGNOSIS — I2510 Atherosclerotic heart disease of native coronary artery without angina pectoris: Secondary | ICD-10-CM | POA: Diagnosis not present

## 2023-06-20 DIAGNOSIS — E119 Type 2 diabetes mellitus without complications: Secondary | ICD-10-CM | POA: Diagnosis not present

## 2023-06-20 DIAGNOSIS — Z79899 Other long term (current) drug therapy: Secondary | ICD-10-CM

## 2023-06-20 DIAGNOSIS — Z8249 Family history of ischemic heart disease and other diseases of the circulatory system: Secondary | ICD-10-CM | POA: Diagnosis not present

## 2023-06-20 DIAGNOSIS — R54 Age-related physical debility: Secondary | ICD-10-CM | POA: Diagnosis present

## 2023-06-20 DIAGNOSIS — N400 Enlarged prostate without lower urinary tract symptoms: Secondary | ICD-10-CM | POA: Diagnosis present

## 2023-06-20 DIAGNOSIS — F1120 Opioid dependence, uncomplicated: Secondary | ICD-10-CM | POA: Diagnosis present

## 2023-06-20 DIAGNOSIS — G35 Multiple sclerosis: Secondary | ICD-10-CM | POA: Diagnosis present

## 2023-06-20 DIAGNOSIS — Z87891 Personal history of nicotine dependence: Secondary | ICD-10-CM

## 2023-06-20 DIAGNOSIS — Z89422 Acquired absence of other left toe(s): Secondary | ICD-10-CM

## 2023-06-20 DIAGNOSIS — Z7982 Long term (current) use of aspirin: Secondary | ICD-10-CM

## 2023-06-20 DIAGNOSIS — L089 Local infection of the skin and subcutaneous tissue, unspecified: Secondary | ICD-10-CM | POA: Diagnosis not present

## 2023-06-20 DIAGNOSIS — I6990 Unspecified sequelae of unspecified cerebrovascular disease: Secondary | ICD-10-CM

## 2023-06-20 DIAGNOSIS — M7732 Calcaneal spur, left foot: Secondary | ICD-10-CM | POA: Diagnosis not present

## 2023-06-20 DIAGNOSIS — Z8673 Personal history of transient ischemic attack (TIA), and cerebral infarction without residual deficits: Secondary | ICD-10-CM

## 2023-06-20 DIAGNOSIS — N182 Chronic kidney disease, stage 2 (mild): Secondary | ICD-10-CM | POA: Diagnosis not present

## 2023-06-20 DIAGNOSIS — Z961 Presence of intraocular lens: Secondary | ICD-10-CM | POA: Diagnosis present

## 2023-06-20 DIAGNOSIS — M86172 Other acute osteomyelitis, left ankle and foot: Secondary | ICD-10-CM

## 2023-06-20 DIAGNOSIS — Z7902 Long term (current) use of antithrombotics/antiplatelets: Secondary | ICD-10-CM

## 2023-06-20 DIAGNOSIS — Z8379 Family history of other diseases of the digestive system: Secondary | ICD-10-CM

## 2023-06-20 DIAGNOSIS — R5381 Other malaise: Secondary | ICD-10-CM | POA: Diagnosis present

## 2023-06-20 DIAGNOSIS — J449 Chronic obstructive pulmonary disease, unspecified: Secondary | ICD-10-CM | POA: Diagnosis present

## 2023-06-20 DIAGNOSIS — L89152 Pressure ulcer of sacral region, stage 2: Secondary | ICD-10-CM | POA: Diagnosis present

## 2023-06-20 DIAGNOSIS — J9610 Chronic respiratory failure, unspecified whether with hypoxia or hypercapnia: Secondary | ICD-10-CM | POA: Diagnosis present

## 2023-06-20 DIAGNOSIS — Z89412 Acquired absence of left great toe: Secondary | ICD-10-CM

## 2023-06-20 DIAGNOSIS — Z89421 Acquired absence of other right toe(s): Secondary | ICD-10-CM

## 2023-06-20 DIAGNOSIS — E1069 Type 1 diabetes mellitus with other specified complication: Secondary | ICD-10-CM | POA: Diagnosis not present

## 2023-06-20 DIAGNOSIS — I252 Old myocardial infarction: Secondary | ICD-10-CM

## 2023-06-20 DIAGNOSIS — I69354 Hemiplegia and hemiparesis following cerebral infarction affecting left non-dominant side: Secondary | ICD-10-CM | POA: Diagnosis not present

## 2023-06-20 DIAGNOSIS — E10621 Type 1 diabetes mellitus with foot ulcer: Secondary | ICD-10-CM | POA: Diagnosis present

## 2023-06-20 DIAGNOSIS — E1022 Type 1 diabetes mellitus with diabetic chronic kidney disease: Secondary | ICD-10-CM | POA: Diagnosis present

## 2023-06-20 DIAGNOSIS — L97529 Non-pressure chronic ulcer of other part of left foot with unspecified severity: Secondary | ICD-10-CM | POA: Diagnosis present

## 2023-06-20 DIAGNOSIS — I10 Essential (primary) hypertension: Secondary | ICD-10-CM | POA: Diagnosis present

## 2023-06-20 DIAGNOSIS — E108 Type 1 diabetes mellitus with unspecified complications: Secondary | ICD-10-CM | POA: Diagnosis present

## 2023-06-20 DIAGNOSIS — M2142 Flat foot [pes planus] (acquired), left foot: Secondary | ICD-10-CM | POA: Diagnosis not present

## 2023-06-20 DIAGNOSIS — E1142 Type 2 diabetes mellitus with diabetic polyneuropathy: Secondary | ICD-10-CM | POA: Diagnosis present

## 2023-06-20 DIAGNOSIS — G250 Essential tremor: Secondary | ICD-10-CM | POA: Diagnosis present

## 2023-06-20 LAB — GLUCOSE, CAPILLARY
Glucose-Capillary: 82 mg/dL (ref 70–99)
Glucose-Capillary: 153 mg/dL — ABNORMAL HIGH (ref 70–99)

## 2023-06-20 LAB — CBC
HCT: 41.7 % (ref 39.0–52.0)
Hemoglobin: 13.7 g/dL (ref 13.0–17.0)
MCH: 32.5 pg (ref 26.0–34.0)
MCHC: 32.9 g/dL (ref 30.0–36.0)
MCV: 98.8 fL (ref 80.0–100.0)
Platelets: 124 10*3/uL — ABNORMAL LOW (ref 150–400)
RBC: 4.22 MIL/uL (ref 4.22–5.81)
RDW: 14.4 % (ref 11.5–15.5)
WBC: 8.2 10*3/uL (ref 4.0–10.5)
nRBC: 0 % (ref 0.0–0.2)

## 2023-06-20 LAB — CREATININE, SERUM
Creatinine, Ser: 1.36 mg/dL — ABNORMAL HIGH (ref 0.61–1.24)
GFR, Estimated: 54 mL/min — ABNORMAL LOW (ref >60)

## 2023-06-20 LAB — SEDIMENTATION RATE: Sed Rate: 52 mm/h — ABNORMAL HIGH (ref 0–16)

## 2023-06-20 LAB — COMPREHENSIVE METABOLIC PANEL WITH GFR
ALT: 16 U/L (ref 0–44)
AST: 21 U/L (ref 15–41)
Albumin: 2.3 g/dL — ABNORMAL LOW (ref 3.5–5.0)
Alkaline Phosphatase: 281 U/L — ABNORMAL HIGH (ref 38–126)
Anion gap: 6 (ref 5–15)
BUN: 23 mg/dL (ref 8–23)
CO2: 31 mmol/L (ref 22–32)
Calcium: 8.5 mg/dL — ABNORMAL LOW (ref 8.9–10.3)
Chloride: 99 mmol/L (ref 98–111)
Creatinine, Ser: 1.35 mg/dL — ABNORMAL HIGH (ref 0.61–1.24)
GFR, Estimated: 55 mL/min — ABNORMAL LOW (ref >60)
Glucose, Bld: 162 mg/dL — ABNORMAL HIGH (ref 70–99)
Potassium: 4.8 mmol/L (ref 3.5–5.1)
Sodium: 136 mmol/L (ref 135–145)
Total Bilirubin: 0.9 mg/dL (ref 0.0–1.2)
Total Protein: 7.3 g/dL (ref 6.5–8.1)

## 2023-06-20 LAB — C-REACTIVE PROTEIN: CRP: 5.1 mg/dL — ABNORMAL HIGH (ref <1.0)

## 2023-06-20 MED ORDER — SODIUM CHLORIDE 0.9% FLUSH
10.0000 mL | Freq: Two times a day (BID) | INTRAVENOUS | Status: DC
  Administered 2023-06-20 – 2023-06-23 (×4): 10 mL

## 2023-06-20 MED ORDER — INSULIN NPH (HUMAN) (ISOPHANE) 100 UNIT/ML ~~LOC~~ SUSP
10.0000 [IU] | Freq: Every day | SUBCUTANEOUS | Status: DC
  Administered 2023-06-20: 10 [IU] via SUBCUTANEOUS

## 2023-06-20 MED ORDER — SODIUM CHLORIDE 0.9% FLUSH
10.0000 mL | Freq: Two times a day (BID) | INTRAVENOUS | Status: DC
Start: 2023-06-20 — End: 2023-06-23
  Administered 2023-06-20 – 2023-06-23 (×6): 10 mL

## 2023-06-20 MED ORDER — DULOXETINE HCL 60 MG PO CPEP
60.0000 mg | ORAL_CAPSULE | Freq: Two times a day (BID) | ORAL | Status: DC
  Administered 2023-06-20 – 2023-06-23 (×6): 60 mg via ORAL
  Filled 2023-06-20 (×6): qty 1

## 2023-06-20 MED ORDER — ONDANSETRON HCL 4 MG/2ML IJ SOLN: 4.0000 mg | Freq: Four times a day (QID) | INTRAMUSCULAR | Status: DC | PRN

## 2023-06-20 MED ORDER — VANCOMYCIN HCL 1750 MG/350ML IV SOLN
1750.0000 mg | Freq: Once | INTRAVENOUS | Status: AC
  Administered 2023-06-20: 1750 mg via INTRAVENOUS
  Filled 2023-06-20: qty 350

## 2023-06-20 MED ORDER — TRAZODONE HCL 50 MG PO TABS
150.0000 mg | ORAL_TABLET | Freq: Every day | ORAL | Status: DC
  Administered 2023-06-20 – 2023-06-22 (×3): 150 mg via ORAL
  Filled 2023-06-20 (×3): qty 3

## 2023-06-20 MED ORDER — AMLODIPINE BESYLATE 5 MG PO TABS
5.0000 mg | ORAL_TABLET | Freq: Every morning | ORAL | Status: DC
Start: 2023-06-21 — End: 2023-06-23
  Administered 2023-06-21 – 2023-06-23 (×3): 5 mg via ORAL
  Filled 2023-06-20 (×3): qty 1

## 2023-06-20 MED ORDER — METOPROLOL TARTRATE 25 MG PO TABS
25.0000 mg | ORAL_TABLET | Freq: Two times a day (BID) | ORAL | Status: DC
  Administered 2023-06-20 – 2023-06-23 (×6): 25 mg via ORAL
  Filled 2023-06-20 (×6): qty 1

## 2023-06-20 MED ORDER — INSULIN ISOPHANE HUMAN 100 UNIT/ML KWIKPEN: 10.0000 [IU] | PEN_INJECTOR | SUBCUTANEOUS | Status: DC

## 2023-06-20 MED ORDER — VANCOMYCIN HCL 1500 MG/300ML IV SOLN
1500.0000 mg | INTRAVENOUS | Status: DC
  Administered 2023-06-21 – 2023-06-22 (×2): 1500 mg via INTRAVENOUS
  Filled 2023-06-20 (×2): qty 300

## 2023-06-20 MED ORDER — OXYCODONE HCL 5 MG PO TABS
15.0000 mg | ORAL_TABLET | Freq: Four times a day (QID) | ORAL | Status: DC | PRN
  Administered 2023-06-20 – 2023-06-23 (×8): 15 mg via ORAL
  Filled 2023-06-20 (×8): qty 3

## 2023-06-20 MED ORDER — CLOPIDOGREL BISULFATE 75 MG PO TABS
75.0000 mg | ORAL_TABLET | Freq: Every day | ORAL | Status: DC
  Administered 2023-06-21 – 2023-06-23 (×3): 75 mg via ORAL
  Filled 2023-06-20 (×3): qty 1

## 2023-06-20 MED ORDER — ENOXAPARIN SODIUM 40 MG/0.4ML IJ SOSY
40.0000 mg | PREFILLED_SYRINGE | INTRAMUSCULAR | Status: DC
  Administered 2023-06-20 – 2023-06-22 (×3): 40 mg via SUBCUTANEOUS
  Filled 2023-06-20 (×3): qty 0.4

## 2023-06-20 MED ORDER — FINASTERIDE 5 MG PO TABS
5.0000 mg | ORAL_TABLET | Freq: Every day | ORAL | Status: DC
  Administered 2023-06-21 – 2023-06-23 (×3): 5 mg via ORAL
  Filled 2023-06-20 (×3): qty 1

## 2023-06-20 MED ORDER — ASPIRIN 81 MG PO CHEW
81.0000 mg | CHEWABLE_TABLET | Freq: Every day | ORAL | Status: DC
  Administered 2023-06-21 – 2023-06-23 (×3): 81 mg via ORAL
  Filled 2023-06-20 (×3): qty 1

## 2023-06-20 MED ORDER — INSULIN ASPART 100 UNIT/ML IJ SOLN
0.0000 [IU] | Freq: Three times a day (TID) | INTRAMUSCULAR | Status: DC
  Administered 2023-06-21 – 2023-06-23 (×4): 5 [IU] via SUBCUTANEOUS
  Administered 2023-06-23: 11 [IU] via SUBCUTANEOUS

## 2023-06-20 MED ORDER — INSULIN NPH (HUMAN) (ISOPHANE) 100 UNIT/ML ~~LOC~~ SUSP
20.0000 [IU] | Freq: Every day | SUBCUTANEOUS | Status: DC
Start: 2023-06-21 — End: 2023-06-21
  Filled 2023-06-20: qty 10

## 2023-06-20 MED ORDER — ONDANSETRON HCL 4 MG PO TABS: 4.0000 mg | ORAL_TABLET | Freq: Four times a day (QID) | ORAL | Status: DC | PRN

## 2023-06-20 MED ORDER — SODIUM CHLORIDE 0.9% FLUSH: 10.0000 mL | INTRAVENOUS | Status: DC | PRN

## 2023-06-20 MED ORDER — INSULIN ASPART 100 UNIT/ML IJ SOLN
3.0000 [IU] | Freq: Three times a day (TID) | INTRAMUSCULAR | Status: DC
  Administered 2023-06-21 – 2023-06-23 (×5): 3 [IU] via SUBCUTANEOUS

## 2023-06-20 MED ORDER — TAPENTADOL HCL ER 100 MG PO TB12
200.0000 mg | ORAL_TABLET | Freq: Two times a day (BID) | ORAL | Status: DC
  Administered 2023-06-20 – 2023-06-23 (×6): 200 mg via ORAL
  Filled 2023-06-20 (×6): qty 2

## 2023-06-20 MED ORDER — GERHARDT'S BUTT CREAM
TOPICAL_CREAM | Freq: Three times a day (TID) | CUTANEOUS | Status: DC
  Filled 2023-06-20: qty 60

## 2023-06-20 MED ORDER — SODIUM CHLORIDE 0.9 % IV SOLN
2.0000 g | Freq: Once | INTRAVENOUS | Status: AC
  Administered 2023-06-20: 2 g via INTRAVENOUS
  Filled 2023-06-20: qty 12.5

## 2023-06-20 MED ORDER — SODIUM CHLORIDE 0.9 % IV SOLN
2.0000 g | Freq: Two times a day (BID) | INTRAVENOUS | Status: DC
  Administered 2023-06-21 – 2023-06-23 (×5): 2 g via INTRAVENOUS
  Filled 2023-06-20 (×5): qty 12.5

## 2023-06-20 MED ORDER — ATORVASTATIN CALCIUM 10 MG PO TABS
10.0000 mg | ORAL_TABLET | Freq: Every day | ORAL | Status: DC
  Administered 2023-06-20 – 2023-06-22 (×3): 10 mg via ORAL
  Filled 2023-06-20 (×3): qty 1

## 2023-06-20 MED ORDER — INSULIN ASPART 100 UNIT/ML IJ SOLN: 0.0000 [IU] | Freq: Every day | INTRAMUSCULAR | Status: DC

## 2023-06-20 NOTE — Telephone Encounter (Signed)
 Called pt to cancel DSM appt wife answered the phone; they will talk to Dr. Luster Salters today about getting a referral over to [facility].

## 2023-06-20 NOTE — Plan of Care (Signed)

## 2023-06-20 NOTE — H&P (Unsigned)
 [name]

## 2023-06-20 NOTE — Consult Note (Signed)
 WOC Nurse Consult Note: Reason for Consult: sacrum  Wound type: Moisture Associated Skin Damage vs ?contact dermatitis? To buttocks/sacrum  Pressure Injury POA: NA, does not appear to be pressure  Measurement: see nursing flowsheet  Wound bed:  deep red discoloration with scattered partial thickness skin loss and peeling skin  Drainage (amount, consistency, odor) appears largely dry  Periwound: some dark discoloration onto sacrum  Dressing procedure/placement/frequency: Cleanse sacrum/buttocks with soap and water, dry and apply Gerhardt's Butt Cream 3 times daily and prn soiling. May cover with ABD pad if desired.   POC discussed with bedside nurse. WOC team will not follow.  Re-consult if further needs arise.   Thank you,    Ronni Colace MSN, RN-BC, [employer] [phone]

## 2023-06-20 NOTE — H&P (Signed)
 History and Physical    Dylon M Rowen Jr. UEA:[id number] DOB: 1945/12/28 DOA: 06/20/2023  Referring MD/NP/PA: Podiatry Dr. Luster Salters PCP:  Patient coming from: Podiatry clinic  Chief Complaint: Foot infection  HPI: Gracelyn Laurence Apachito Jr. 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 - Spouse reports increased redness swelling and foul-smelling discharge from left foot over the past 1 week, no fevers or chills.  He has had multiple prior toe amputations, [facility], sent to [facility] for further evaluation. - Vital signs stable, no labs or imaging available at this time  Review of Systems: As per HPI otherwise 14 point review of systems negative.   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   IMO SNOMED Dx Update Oct 2024     Charcot's arthropathy, diabetic (HCC)    left foot   Chronic pain syndrome followed by dr mark phillps at pain clinic   secondary to MS and diabetic neuropathy   Chronic periodontal disease    CKD (chronic kidney disease), stage II    hypertensive per PCP note   Coronary artery disease hx MI  s/p  DES to [facility] 2005   hx cardiologist-- dr Judy Null until he retired,  now followed by pcp , dr Delorise Few   Depression    Diabetic neuropathy [facility])    Essential tremor    Fecal impaction (HCC) 03/31/2017   Gait disorder    uses walker   History of CVA (cerebrovascular accident)    2011-  right subcortical cva--  residual left mininmal side weakness and no use of left head   History of CVA with residual deficit    2011  --  right subcortical cva w/ mininmal left hemiparesis   History of diabetic ulcer of foot    2010  left foot    History of MI (myocardial infarction)    2005   Hypertension    dr Delorise Few   MS (multiple sclerosis) [facility])    dx 1985   Osteomyelitis of great toe of left foot (HCC) 01/30/2023   PONV (postoperative nausea and vomiting)    S/P drug eluting coronary stent placement    2005  to midRCA   Type 1 diabetes mellitus on insulin  [facility])     Past Surgical History:  Procedure Laterality Date   AMPUTATION Right 03/01/2023   Procedure: AMPUTATION RIGHT GREAT TOE;  Surgeon: Evertt Hoe, DPM;  Location: [facility] OR;  Service: Orthopedics/Podiatry;  Laterality: Right;   AMPUTATION TOE Left 01/30/2023   Procedure: AMPUTATION TOE;  Surgeon: Evertt Hoe, DPM;  Location: [facility] OR;  Service: Orthopedics/Podiatry;  Laterality: Left;  Amputation of left great toe   AMPUTATION TOE Left 03/01/2023   Procedure: AMPUTATION LEFT SECOND TOE;  Surgeon: Evertt Hoe, DPM;  Location: [facility] OR;  Service: Orthopedics/Podiatry;  Laterality: Left;   CATARACT EXTRACTION W/ INTRAOCULAR LENS  IMPLANT, BILATERAL  Nov 2016   CORONARY ANGIOPLASTY WITH STENT PLACEMENT  10-18-2003  dr Judy Null   DES x1  to  mRCA (high-grade lesion), diffuse diabetic abnormalities throughout the entire coronary tree w/ diffuse calcification and segmental plaquing throughout but noncritical disease   HARDWARE REMOVAL Left 04/20/2012  Procedure: Left foot removal deep retained screw;  Surgeon: Timothy Ford, MD;  Location: [facility] OR;  Service: Orthopedics;  Laterality: Left;   MULTIPLE EXTRACTIONS WITH ALVEOLOPLASTY N/A 05/15/2015   Procedure: MULTIPLE EXTRACTIONS;  Surgeon: Kermit Ped, DDS;  Location: [facility];  Service: Dentistry;  Laterality: N/A;   ORIF ANKLE FRACTURE  12/30/2011   Procedure: OPEN REDUCTION INTERNAL FIXATION (ORIF) ANKLE FRACTURE;  Surgeon: Timothy Ford, MD;  Location: [facility] OR;  Service: Orthopedics;  Laterality: Left;  Excision Bone, Abscess Left Charcot Foot, Place Antibiotic Beads,  Fixation Charcot   REMOVAL SUBMANDIDULAR GLAND, BILATERAL  1970's   benign     reports that he quit smoking about 11 years ago. His smoking use included cigars and cigarettes. He started smoking about 37 years ago. He has a 13 pack-year smoking history. He has never used smokeless tobacco. He reports that he does not drink alcohol  and does not use drugs.  No Known Allergies  Family History  Problem Relation Age of Onset   Tremor Mother    Heart disease Father    Celiac disease Brother    Cancer - Prostate Paternal Grandfather      Prior to Admission medications   Medication Sig Start Date End Date Taking? Authorizing Provider  amLODipine  (NORVASC ) 5 MG tablet Take 5 mg by mouth every morning.     [provider]  aspirin  81 MG tablet Take 81 mg by mouth daily.    [provider]  atorvastatin  (LIPITOR) 10 MG tablet Take 10 mg by mouth at bedtime.    [provider]  clopidogrel  (PLAVIX ) 75 MG tablet Take 75 mg by mouth daily.    [provider]  DULoxetine  (CYMBALTA ) 60 MG capsule Take 60 mg by mouth 2 (two) times daily.    [provider]  finasteride  (PROSCAR ) 5 MG tablet Take 5 mg by mouth daily.    [provider]  insulin  lispro (HUMALOG KWIKPEN) 100 UNIT/ML KwikPen Inject 0-15 Units into the skin 3 (three) times daily as needed (BS > 150).    [provider]  Insulin  NPH, Human,, Isophane, (HUMULIN  N KWIKPEN) 100 UNIT/ML Kiwkpen Inject 10-20 Units into the skin See admin instructions. Inject 20 units in the morning and 10 units in the evening.    [provider]  metoprolol  tartrate (LOPRESSOR ) 25 MG tablet Take 25 mg by mouth 2 (two) times daily.    [provider]  oxyCODONE  (ROXICODONE ) 15 MG immediate release tablet Take 1 tablet (15 mg total) by mouth every 6 (six) hours as needed for pain. 02/10/23   Amin, Ankit C, MD  oxyCODONE  (ROXICODONE ) 15 MG immediate release tablet Take 1 tablet (15 mg  total) by mouth every 6 (six) hours as needed for pain. 03/08/23     oxyCODONE  (ROXICODONE ) 15 MG immediate release tablet Take 1 tablet (15 mg total) by mouth every 6 (six) hours as needed for pain 05/24/23     Tapentadol  HCl (NUCYNTA  ER) 200 MG TB12 Take 200 mg by mouth in the morning and at bedtime.    [provider]  Tapentadol  HCl (NUCYNTA  ER) 200 MG TB12 Take 1 tablet (200 mg) by mouth every 12 (twelve) hours. 03/08/23     Tapentadol  HCl (NUCYNTA  ER) 200 MG TB12 Take 1 tablet (200 mg total) by mouth every 12 (twelve) hours. 05/24/23     traZODone  (DESYREL ) 150 MG tablet Take 150 mg by mouth at bedtime.    [provider]    Physical Exam: Vitals:   06/20/23 1634  BP: 131/62  Pulse: (!) 55  Resp: 18  Temp: 98.3 F (36.8 C)  TempSrc: Oral  SpO2: 95%      Constitutional: Chronically ill debilitated male laying in bed, AO x 3, dysarthria noted HEENT: No JVD CVS: S1-S2, regular rhythm Lungs: Poor air movement bilaterally Abdomen: Soft, nontender, bowel sounds present Extremities: Prior amputation of multiple toes on both feet Left foot with ulcer on the third toe with purulent foul-smelling drainage, erythema warmth and tenderness involving the dorsum of the foot, dorsalis pedis pulses diminished but present Psych: Flat affect  Labs on Admission: I have personally reviewed following labs and imaging studies  CBC: No results for input(s): "WBC", "NEUTROABS", "HGB", "HCT", "MCV", "PLT" in the last 168 hours. Basic Metabolic Panel: No results for input(s): "NA", "K", "CL", "CO2", "GLUCOSE", "BUN", "CREATININE", "CALCIUM ", "MG", "PHOS" in the last 168 hours. GFR: CrCl cannot be calculated ([name] most recent lab result is older than the maximum 21 days allowed.). Liver Function Tests: No results for input(s): "AST", "ALT", "ALKPHOS", "BILITOT", "PROT", "ALBUMIN " in the last 168 hours. No results for input(s): "LIPASE", "AMYLASE" in the last 168 hours. No  results for input(s): "AMMONIA" in the last 168 hours. Coagulation Profile: No results for input(s): "INR", "PROTIME" in the last 168 hours. Cardiac Enzymes: No results for input(s): "CKTOTAL", "CKMB", "CKMBINDEX", "TROPONINI" in the last 168 hours. BNP (last 3 results) No results for input(s): "PROBNP" in the last 8760 hours. HbA1C: No results for input(s): "HGBA1C" in the last 72 hours. CBG: Recent Labs  Lab 06/20/23 1636  GLUCAP 82   Lipid Profile: No results for input(s): "CHOL", "HDL", "LDLCALC", "TRIG", "CHOLHDL", "LDLDIRECT" in the last 72 hours. Thyroid  Function Tests: No results for input(s): "TSH", "T4TOTAL", "FREET4", "T3FREE", "THYROIDAB" in the last 72 hours. Anemia Panel: No results for input(s): "VITAMINB12", "FOLATE", "FERRITIN", "TIBC", "IRON", "RETICCTPCT" in the last 72 hours. Urine analysis:    Component Value Date/Time   COLORURINE YELLOW 02/08/2023 0154   APPEARANCEUR CLEAR 02/08/2023 0154   LABSPEC 1.018 02/08/2023 0154   PHURINE 5.0 02/08/2023 0154   GLUCOSEU >=500 (A) 02/08/2023 0154   HGBUR NEGATIVE 02/08/2023 0154   BILIRUBINUR NEGATIVE 02/08/2023 0154   KETONESUR 20 (A) 02/08/2023 0154   PROTEINUR 30 (A) 02/08/2023 0154   NITRITE NEGATIVE 02/08/2023 0154   LEUKOCYTESUR NEGATIVE 02/08/2023 0154   Sepsis Labs: @LABRCNTIP (procalcitonin:4,lacticidven:4) )No results found for this or any previous visit (from the past 240 hours).   Radiological Exams on Admission: No results found.  EKG: Independently reviewed. pending  Assessment/Plan Principal Problem:    Osteomyelitis of third toe, left foot Cellulitis left foot -Start IV vancomycin  and cefepime  -Check ESR CRP -Await labs, creatinine before ordering MRI with contrast -Podiatry will be following - Check ABI    Type 1 diabetes mellitus with complications (HCC) -Resume home regimen of NPH insulin , add coverage, sliding scale check A1c    Essential hypertension - Continue amlodipine      Multiple sclerosis (HCC)   Diabetic polyneuropathy (HCC)   - Very poor functional status  Chronic pain Narcotic dependence -On high doses narcotics including Nucynta  and oxycodone  at baseline, home regimen resumed    Late effects of cerebrovascular accident -Continue Plavix , statin    Coronary artery disease  - Continue aspirin , Plavix , metoprolol , statin  COPD/chronic respiratory failure On 2 L home O2 at baseline   DVT prophylaxis: lovenox  Code Status: DNR Family Communication: spouse at bedside Admission  status: inpt  Deforest Fast MD [facility]   06/20/2023, 4:53 PM

## 2023-06-20 NOTE — Progress Notes (Addendum)
 Pharmacy Antibiotic Note  [name]. is a 78 y.o. male admitted on 06/20/2023 with cellulitis, possible osteomyelitis of third toe of left foot.  Pharmacy has been consulted for vancomycin  and cefepime  dosing.  Plan: Cefepime  2g IV q12h Vancomycin  1750mg  IV x 1, then 1500mg  IV q24h for estimated AUC 496 using SCr 1.36, Vd 0.72 Check vancomycin  levels at steady state, goal AUC 400-550 Follow up renal function, cultures as available, clinical progress, length of tx  Height: 6' (182.9 cm) Weight: 89.1 kg (196 lb 6.9 oz) IBW/kg (Calculated) : 77.6  Temp (24hrs), Avg:98.5 F (36.9 C), Min:98.3 F (36.8 C), Max:98.7 F (37.1 C)  No results for input(s): "WBC", "CREATININE", "LATICACIDVEN", "VANCOTROUGH", "VANCOPEAK", "VANCORANDOM", "GENTTROUGH", "GENTPEAK", "GENTRANDOM", "TOBRATROUGH", "TOBRAPEAK", "TOBRARND", "AMIKACINPEAK", "AMIKACINTROU", "AMIKACIN" in the last 168 hours.  CrCl cannot be calculated ([name] most recent lab result is older than the maximum 21 days allowed.).    No Known Allergies  Antimicrobials this admission: Vancomycin  5/13 >> Cefepime  5/13 >>  Dose adjustments this admission:  Microbiology results:  Thank you for allowing pharmacy to be a part of this patient's care.  Armanda Bern, PharmD, BCPS 06/20/2023 8:34 PM

## 2023-06-20 NOTE — Progress Notes (Signed)
mi

## 2023-06-20 NOTE — Consult Note (Signed)
 WOC team consulted for sacral wound. Secure chat sent to primary RN requesting photo documentation.   Please note that the [facility] nursing team is utilizing a standardized work plan to manage patient consults. We are triaging consults and will try to see the patients within 48 hours. Wound photos in the patient's chart allow us  to consult on the patient in the most efficient and timely manner.    Thank you,    Ronni Colace MSN, RN-BC, [employer] [phone]

## 2023-06-20 NOTE — Progress Notes (Signed)

## 2023-06-20 NOTE — Progress Notes (Addendum)
 Chief Complaint  Patient presents with   Diabetic Ulcer    "I have another toe that is like the other ones that I had." N - ulcerated toe L - 3rd left D - last week  O - suddenly, gotten worse C - red, swelling, drainage A - none T - Antibiotic cream, bandage    Subjective:  Patient presents this morning with his spouse for acute onset of redness with swelling and ulcer to the third digit of the left foot.  History of bilateral great toe amputations as well as amputation to the left second toe.  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   IMO SNOMED Dx Update Oct 2024     Charcot's arthropathy, diabetic (HCC)    left foot   Chronic pain syndrome followed by dr mark phillps at pain clinic   secondary to MS and diabetic neuropathy   Chronic periodontal disease    CKD (chronic kidney disease), stage II    hypertensive per PCP note   Coronary artery disease hx MI  s/p  DES to [facility] 2005   hx cardiologist-- dr Judy Null until he retired,  now followed by pcp , dr Delorise Few   Depression    Diabetic neuropathy [facility])    Essential tremor    Fecal impaction (HCC) 03/31/2017   Gait disorder    uses walker   History of CVA (cerebrovascular accident)    2011-  right subcortical cva--  residual left mininmal side weakness and no use of left head   History of CVA with residual deficit    2011  --  right subcortical cva w/ mininmal left hemiparesis   History of diabetic ulcer of foot    2010  left foot   History of MI (myocardial infarction)    2005   Hypertension    dr Delorise Few   MS (multiple sclerosis) [facility])    dx 1985   Osteomyelitis of great toe of left foot (HCC) 01/30/2023   PONV (postoperative nausea and vomiting)    S/P drug eluting coronary stent placement    2005  to midRCA   Type 1 diabetes mellitus on insulin  therapy [facility])     Past Surgical  History:  Procedure Laterality Date   AMPUTATION Right 03/01/2023   Procedure: AMPUTATION RIGHT GREAT TOE;  Surgeon: Evertt Hoe, DPM;  Location: [facility] OR;  Service: Orthopedics/Podiatry;  Laterality: Right;   AMPUTATION TOE Left 01/30/2023   Procedure: AMPUTATION TOE;  Surgeon: Evertt Hoe, DPM;  Location: [facility] OR;  Service: Orthopedics/Podiatry;  Laterality: Left;  Amputation of left great toe   AMPUTATION TOE Left 03/01/2023   Procedure: AMPUTATION LEFT SECOND TOE;  Surgeon: Evertt Hoe, DPM;  Location: [facility] OR;  Service: Orthopedics/Podiatry;  Laterality: Left;   CATARACT EXTRACTION W/ INTRAOCULAR LENS  IMPLANT, BILATERAL  Nov 2016   CORONARY ANGIOPLASTY WITH STENT PLACEMENT  10-18-2003  dr Judy Null   DES x1  to  [facility] (high-grade lesion), diffuse diabetic abnormalities throughout the entire coronary tree w/ diffuse calcification and segmental plaquing throughout but noncritical disease   HARDWARE REMOVAL Left 04/20/2012   Procedure: Left foot removal deep retained screw;  Surgeon: Timothy Ford, MD;  Location: [facility] OR;  Service: Orthopedics;  Laterality: Left;   MULTIPLE EXTRACTIONS WITH ALVEOLOPLASTY N/A 05/15/2015   Procedure: MULTIPLE EXTRACTIONS;  Surgeon: Kermit Ped, DDS;  Location:  [facility];  Service: Dentistry;  Laterality: N/A;   ORIF ANKLE FRACTURE  12/30/2011   Procedure: OPEN REDUCTION INTERNAL FIXATION (ORIF) ANKLE FRACTURE;  Surgeon: Timothy Ford, MD;  Location: [facility] OR;  Service: Orthopedics;  Laterality: Left;  Excision Bone, Abscess Left Charcot Foot, Place Antibiotic Beads, Fixation Charcot   REMOVAL SUBMANDIDULAR GLAND, BILATERAL  1970's   benign    No Known Allergies     LT foot 06/20/2023  Objective/Physical Exam Acute onset of erythema and edema to the left foot.  Foul-smelling wound noted to the third digit of the left foot with likely underlying osteomyelitis  Assessment: 1.  Acute onset of ulcer of the left third toe  with likely underlying osteomyelitis with foul odor 2.  Acute onset of cellulitis left foot   Plan of Care:  -Patient was evaluated.   -Given the patient's multiple comorbidities and poor health with onset of acute cellulitis to the foot and likely osteomyelitis to the left third toe with foul odor and purulence, recommend admission to the hospital for full workup and evaluation and to be started on IV antibiotics.  Patient will need amputation of the left third toe at minimum.  Recommend MRI if possible. [facility] admit line was contacted during our visit for bed placement and admission through the hospitalist service.  Greatly appreciated - Betadine wet-to-dry dressings applied. -Will reach out to hospital podiatry, Dr. Rosemarie Conquest.     Dot Gazella, DPM [facility]  Dr. Dot Gazella, DPM    [address] [place], [place] [address]                Office [phone]  Fax [phone]

## 2023-06-21 ENCOUNTER — Encounter (HOSPITAL_COMMUNITY)
Admission: AD | Disposition: A | Discharge status: Home / Self Care | Payer: Self-pay | Source: Ambulatory Visit | Attending: Internal Medicine

## 2023-06-21 ENCOUNTER — Inpatient Hospital Stay (HOSPITAL_COMMUNITY)

## 2023-06-21 ENCOUNTER — Other Ambulatory Visit: Payer: Self-pay

## 2023-06-21 ENCOUNTER — Encounter (HOSPITAL_COMMUNITY): Payer: Self-pay | Admitting: Internal Medicine

## 2023-06-21 DIAGNOSIS — I2510 Atherosclerotic heart disease of native coronary artery without angina pectoris: Secondary | ICD-10-CM

## 2023-06-21 DIAGNOSIS — M869 Osteomyelitis, unspecified: Secondary | ICD-10-CM

## 2023-06-21 DIAGNOSIS — M6289 Other specified disorders of muscle: Secondary | ICD-10-CM | POA: Diagnosis not present

## 2023-06-21 DIAGNOSIS — I10 Essential (primary) hypertension: Secondary | ICD-10-CM

## 2023-06-21 DIAGNOSIS — M86172 Other acute osteomyelitis, left ankle and foot: Secondary | ICD-10-CM | POA: Diagnosis not present

## 2023-06-21 DIAGNOSIS — E119 Type 2 diabetes mellitus without complications: Secondary | ICD-10-CM

## 2023-06-21 HISTORY — PX: AMPUTATION TOE: SHX6595

## 2023-06-21 LAB — GLUCOSE, CAPILLARY
Glucose-Capillary: 93 mg/dL (ref 70–99)
Glucose-Capillary: 42 mg/dL — CL (ref 70–99)
Glucose-Capillary: 104 mg/dL — ABNORMAL HIGH (ref 70–99)
Glucose-Capillary: 129 mg/dL — ABNORMAL HIGH (ref 70–99)
Glucose-Capillary: 67 mg/dL — ABNORMAL LOW (ref 70–99)
Glucose-Capillary: 102 mg/dL — ABNORMAL HIGH (ref 70–99)
Glucose-Capillary: 207 mg/dL — ABNORMAL HIGH (ref 70–99)
Glucose-Capillary: 198 mg/dL — ABNORMAL HIGH (ref 70–99)

## 2023-06-21 LAB — CBC
HCT: 40 % (ref 39.0–52.0)
Hemoglobin: 12.9 g/dL — ABNORMAL LOW (ref 13.0–17.0)
MCH: 32.2 pg (ref 26.0–34.0)
MCHC: 32.3 g/dL (ref 30.0–36.0)
MCV: 99.8 fL (ref 80.0–100.0)
Platelets: 116 10*3/uL — ABNORMAL LOW (ref 150–400)
RBC: 4.01 MIL/uL — ABNORMAL LOW (ref 4.22–5.81)
RDW: 14.3 % (ref 11.5–15.5)
WBC: 6.8 10*3/uL (ref 4.0–10.5)
nRBC: 0 % (ref 0.0–0.2)

## 2023-06-21 LAB — COMPREHENSIVE METABOLIC PANEL WITH GFR
ALT: 14 U/L (ref 0–44)
AST: 20 U/L (ref 15–41)
Albumin: 2 g/dL — ABNORMAL LOW (ref 3.5–5.0)
Alkaline Phosphatase: 246 U/L — ABNORMAL HIGH (ref 38–126)
Anion gap: 3 — ABNORMAL LOW (ref 5–15)
BUN: 21 mg/dL (ref 8–23)
CO2: 30 mmol/L (ref 22–32)
Calcium: 8 mg/dL — ABNORMAL LOW (ref 8.9–10.3)
Chloride: 102 mmol/L (ref 98–111)
Creatinine, Ser: 1.26 mg/dL — ABNORMAL HIGH (ref 0.61–1.24)
GFR, Estimated: 59 mL/min — ABNORMAL LOW (ref >60)
Glucose, Bld: 135 mg/dL — ABNORMAL HIGH (ref 70–99)
Potassium: 4.2 mmol/L (ref 3.5–5.1)
Sodium: 135 mmol/L (ref 135–145)
Total Bilirubin: 0.7 mg/dL (ref 0.0–1.2)
Total Protein: 6.3 g/dL — ABNORMAL LOW (ref 6.5–8.1)

## 2023-06-21 LAB — VAS US ABI WITH/WO TBI: Left ABI: 1.01

## 2023-06-21 LAB — PROTIME-INR
INR: 1.1 (ref 0.8–1.2)
Prothrombin Time: 14.1 s (ref 11.4–15.2)

## 2023-06-21 SURGERY — AMPUTATION, TOE
Anesthesia: Monitor Anesthesia Care | Site: Toe | Laterality: Left

## 2023-06-21 MED ORDER — INSULIN NPH (HUMAN) (ISOPHANE) 100 UNIT/ML ~~LOC~~ SUSP
4.0000 [IU] | Freq: Every day | SUBCUTANEOUS | Status: DC
  Administered 2023-06-21: 4 [IU] via SUBCUTANEOUS

## 2023-06-21 MED ORDER — MIDAZOLAM HCL 2 MG/2ML IJ SOLN: 0.5000 mg | Freq: Once | INTRAMUSCULAR | Status: DC | PRN

## 2023-06-21 MED ORDER — BUPIVACAINE HCL (PF) 0.5 % IJ SOLN
INTRAMUSCULAR | Status: DC | PRN
  Administered 2023-06-21: 10 mL

## 2023-06-21 MED ORDER — FENTANYL CITRATE (PF) 100 MCG/2ML IJ SOLN: 25.0000 ug | INTRAMUSCULAR | Status: DC | PRN

## 2023-06-21 MED ORDER — BUPIVACAINE HCL (PF) 0.5 % IJ SOLN
INTRAMUSCULAR | Status: AC
  Filled 2023-06-21: qty 30

## 2023-06-21 MED ORDER — STERILE WATER FOR IRRIGATION IR SOLN
Status: DC | PRN
  Administered 2023-06-21: 1000 mL

## 2023-06-21 MED ORDER — CHLORHEXIDINE GLUCONATE CLOTH 2 % EX PADS: 6.0000 | MEDICATED_PAD | Freq: Every day | CUTANEOUS | Status: DC

## 2023-06-21 MED ORDER — OXYCODONE HCL 5 MG/5ML PO SOLN: 5.0000 mg | Freq: Once | ORAL | Status: DC | PRN

## 2023-06-21 MED ORDER — DEXTROSE 50 % IV SOLN
12.5000 g | INTRAVENOUS | Status: AC
  Filled 2023-06-21: qty 50

## 2023-06-21 MED ORDER — LIDOCAINE HCL (PF) 1 % IJ SOLN
INTRAMUSCULAR | Status: AC
  Filled 2023-06-21: qty 30

## 2023-06-21 MED ORDER — INSULIN NPH (HUMAN) (ISOPHANE) 100 UNIT/ML ~~LOC~~ SUSP
10.0000 [IU] | Freq: Every day | SUBCUTANEOUS | Status: DC
  Administered 2023-06-22 – 2023-06-23 (×2): 10 [IU] via SUBCUTANEOUS

## 2023-06-21 MED ORDER — FENTANYL CITRATE (PF) 250 MCG/5ML IJ SOLN
INTRAMUSCULAR | Status: DC | PRN
  Administered 2023-06-21: 50 ug via INTRAVENOUS

## 2023-06-21 MED ORDER — DEXTROSE 50 % IV SOLN
INTRAVENOUS | Status: AC
  Administered 2023-06-21: 12.5 g via INTRAVENOUS
  Filled 2023-06-21: qty 50

## 2023-06-21 MED ORDER — CHLORHEXIDINE GLUCONATE 0.12 % MT SOLN: 15.0000 mL | Freq: Once | OROMUCOSAL | Status: AC

## 2023-06-21 MED ORDER — OXYCODONE HCL 5 MG PO TABS: 5.0000 mg | ORAL_TABLET | Freq: Once | ORAL | Status: DC | PRN

## 2023-06-21 MED ORDER — ORAL CARE MOUTH RINSE: 15.0000 mL | Freq: Once | OROMUCOSAL | Status: AC

## 2023-06-21 MED ORDER — INSULIN NPH (HUMAN) (ISOPHANE) 100 UNIT/ML ~~LOC~~ SUSP: 10.0000 [IU] | Freq: Every day | SUBCUTANEOUS | Status: DC

## 2023-06-21 MED ORDER — 0.9 % SODIUM CHLORIDE (POUR BTL) OPTIME
TOPICAL | Status: DC | PRN
  Administered 2023-06-21: 1000 mL

## 2023-06-21 MED ORDER — LIDOCAINE HCL (PF) 1 % IJ SOLN
INTRAMUSCULAR | Status: DC | PRN
  Administered 2023-06-21: 10 mL

## 2023-06-21 MED ORDER — SODIUM CHLORIDE 0.9 % IV SOLN: INTRAVENOUS | Status: DC | PRN

## 2023-06-21 MED ORDER — INSULIN NPH (HUMAN) (ISOPHANE) 100 UNIT/ML ~~LOC~~ SUSP: 6.0000 [IU] | Freq: Every day | SUBCUTANEOUS | Status: DC

## 2023-06-21 MED ORDER — DEXTROSE 5 % IV SOLN: INTRAVENOUS | Status: DC

## 2023-06-21 MED ORDER — CHLORHEXIDINE GLUCONATE 0.12 % MT SOLN
OROMUCOSAL | Status: AC
  Administered 2023-06-21: 15 mL via OROMUCOSAL
  Filled 2023-06-21: qty 15

## 2023-06-21 MED ORDER — PROPOFOL 500 MG/50ML IV EMUL
INTRAVENOUS | Status: DC | PRN
  Administered 2023-06-21: 50 ug/kg/min via INTRAVENOUS

## 2023-06-21 MED ORDER — INSULIN ASPART 100 UNIT/ML IJ SOLN: 0.0000 [IU] | INTRAMUSCULAR | Status: DC | PRN

## 2023-06-21 MED ORDER — FENTANYL CITRATE (PF) 250 MCG/5ML IJ SOLN
INTRAMUSCULAR | Status: AC
  Filled 2023-06-21: qty 5

## 2023-06-21 MED ORDER — LACTATED RINGERS IV SOLN: INTRAVENOUS | Status: DC

## 2023-06-21 MED ORDER — DEXTROSE 50 % IV SOLN
INTRAVENOUS | Status: AC
Start: 2023-06-21 — End: 2023-06-21
  Administered 2023-06-21: 25 mL
  Filled 2023-06-21: qty 50

## 2023-06-21 SURGICAL SUPPLY — 19 items
BLADE AVERAGE 25X9 (BLADE) IMPLANT
BNDG ELASTIC 4INX 5YD STR LF (GAUZE/BANDAGES/DRESSINGS) IMPLANT
BNDG GAUZE DERMACEA FLUFF 4 (GAUZE/BANDAGES/DRESSINGS) IMPLANT
DRSG XEROFORM 1X8 (GAUZE/BANDAGES/DRESSINGS) IMPLANT
ELECTRODE REM PT RTRN 9FT ADLT (ELECTROSURGICAL) ×1 IMPLANT
GAUZE SPONGE 4X4 12PLY STRL (GAUZE/BANDAGES/DRESSINGS) ×1 IMPLANT
GLOVE BIO SURGEON STRL SZ7.5 (GLOVE) ×1 IMPLANT
GLOVE BIOGEL PI IND STRL 7.5 (GLOVE) ×1 IMPLANT
GOWN STRL REUS W/ TWL LRG LVL3 (GOWN DISPOSABLE) ×2 IMPLANT
KIT BASIN OR (CUSTOM PROCEDURE TRAY) ×1 IMPLANT
NDL HYPO 25X1 1.5 SAFETY (NEEDLE) ×1 IMPLANT
NEEDLE HYPO 25X1 1.5 SAFETY (NEEDLE) ×1 IMPLANT
PACK ORTHO EXTREMITY (CUSTOM PROCEDURE TRAY) ×1 IMPLANT
SUT PROLENE 3 0 PS 2 (SUTURE) IMPLANT
SYR CONTROL 10ML LL (SYRINGE) ×1 IMPLANT
TUBE CONNECTING 12X1/4 (SUCTIONS) IMPLANT
UNDERPAD 30X36 HEAVY ABSORB (UNDERPADS AND DIAPERS) ×1 IMPLANT
WATER STERILE IRR 1000ML POUR (IV SOLUTION) ×1 IMPLANT
YANKAUER SUCT BULB TIP NO VENT (SUCTIONS) IMPLANT

## 2023-06-21 NOTE — Inpatient Diabetes Management (Addendum)
 Inpatient Diabetes Program Recommendations  AACE/ADA: New Consensus Statement on Inpatient Glycemic Control (2015)  Target Ranges:  Prepandial:   less than 140 mg/dL      Peak postprandial:   less than 180 mg/dL (1-2 hours)      Critically ill patients:  140 - 180 mg/dL   Lab Results  Component Value Date   GLUCAP 129 (H) 06/21/2023   HGBA1C 6.5 (H) 02/09/2023    Review of Glycemic Control  Latest Reference Range & Units 06/20/23 16:36 06/20/23 21:08 06/21/23 06:36 06/21/23 10:07 06/21/23 10:52 06/21/23 12:45 06/21/23 13:05  Glucose-Capillary 70 - 99 mg/dL 82 213 (H) 93 42 (LL) 086 (H) 67 (L) 129 (H)   Diabetes history: DM 1 Outpatient Diabetes medications:  Humulin  N 20 units in the AM and 10 units in the PM Humalog 0-15 units tid  Current orders for Inpatient glycemic control:  Novolog  0-15 units tid with meals and HS Novolog  3 units tid with meals NPH 20 units q AM and NPH 10 units q PM  Inpatient Diabetes Program Recommendations:    Note patient is currently in surgery. Hypoglycemia this AM.   Consider reducing NPH doses to 12 units in the AM and 6 units in the PM.   Also please reduce Novolog  correction to 0-6 units tid with meals.    Thanks,  Josefa Ni, RN, BC-ADM Inpatient Diabetes Coordinator Pager [phone]  (8a-5p)

## 2023-06-21 NOTE — Progress Notes (Signed)
 Hypoglycemic Event  CBG: 42   Treatment: D50 25 mL (12.5 gm)  Symptoms: None  Follow-up CBG: Time:1052 CBG Result:104  Possible Reasons for Event: NPO  Comments/MD notified:Mathews MD    Keane Passe

## 2023-06-21 NOTE — Consult Note (Signed)
 PODIATRY CONSULTATION  NAME [name] M Uffelman Jr. MRN [id number] DOB 1945-11-03 DOA 06/20/2023   Reason for consult: Left 3rd toe ulcer, cellulitis  Attending/Consulting physician: William Harris MD  History of present illness: "[name] M Mayhan Jr. 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 - Spouse reports increased redness swelling and foul-smelling discharge from left foot over the past 1 week, no fevers or chills.  He has had multiple prior toe amputations, [facility], sent to [facility] for further evaluation."  Was seen by Dr. Luster Salters in office yesterday, concern for left foot cellulitis and 3rd toe ulceration with concern for underlying osteomyelitis. He is well known to me, have done several amputations of toes in the past, which have healed although somewhat slowly. He does have neuropathy in the forefoot bilaterally.   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   IMO SNOMED Dx Update Oct 2024     Charcot's arthropathy, diabetic (HCC)    left foot   Chronic pain syndrome followed by dr mark phillps at pain clinic   secondary to MS and diabetic neuropathy   Chronic periodontal disease    CKD (chronic kidney disease), stage II    hypertensive per PCP note   Coronary artery disease hx MI  s/p  DES to [facility] 2005   hx cardiologist-- dr Judy Null until he retired,  now followed by pcp , dr Delorise Few   Depression    Diabetic neuropathy [facility])    Essential tremor    Fecal impaction (HCC) 03/31/2017   Gait disorder    uses walker   History of CVA (cerebrovascular accident)    2011-  right subcortical cva--  residual left mininmal side weakness  and no use of left head   History of CVA with residual deficit    2011  --  right subcortical cva w/ mininmal left hemiparesis   History of diabetic ulcer of foot    2010  left foot   History of MI (myocardial infarction)    2005   Hypertension    dr Delorise Few   MS (multiple sclerosis) [facility])    dx 1985   Osteomyelitis of great toe of left foot (HCC) 01/30/2023   PONV (postoperative nausea and vomiting)    S/P drug eluting coronary stent placement    2005  to midRCA   Type 1 diabetes mellitus on insulin  therapy (HCC)        Latest Ref Rng & Units 06/21/2023    3:13 AM 06/20/2023    8:45 PM 03/03/2023    6:51 AM  CBC  WBC 4.0 - 10.5 K/uL 6.8  8.2  6.1   Hemoglobin 13.0 - 17.0 g/dL 53.6  64.4  03.4   Hematocrit 39.0 - 52.0 % 40.0  41.7  33.8   Platelets 150 - 400 K/uL 116  124  143        Latest Ref Rng & Units 06/21/2023    3:13 AM 06/20/2023    8:46 PM 06/20/2023    8:45 PM  BMP  Glucose 70 - 99 mg/dL 742   595   BUN 8 - 23 mg/dL 21   23   Creatinine  0.61 - 1.24 mg/dL 1.61  0.96  0.45   Sodium 135 - 145 mmol/L 135   136   Potassium 3.5 - 5.1 mmol/L 4.2   4.8   Chloride 98 - 111 mmol/L 102   99   CO2 22 - 32 mmol/L 30   31   Calcium  8.9 - 10.3 mg/dL 8.0   8.5       Physical Exam: Lower Extremity Exam Lef 3rd toe with circular ulceration with purulent draiange, erythema and edema extending to midfoot Sensation absent to toes DP and PT pulses 1+    ASSESSMENT/PLAN OF CARE 78 y.o. 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   WBC 6.8 XR L foot: no acute osseous abnormality   - NPO for OR today for L 3rd toe amputation. Deferred MRI due to clinical appearance and prior history (unlikely to heal un ulcer of this depth to the toe) - amputation of the toe is his  best option for source control. He is in agreement.  - Continue IV abx broad spectrum pending further culture data - Anticoagulation: ok to continue perioperatively - Wound care: None needed post op - WB status: WBAT to left foot post op in post op shoe - Will continue to follow   Thank you for the consult.  Please contact me directly with any questions or concerns.           [name], DPM [facility] / [facility]    [address] [place], [place] [address]                Office [phone]  Fax [phone]

## 2023-06-21 NOTE — Plan of Care (Signed)

## 2023-06-21 NOTE — Progress Notes (Signed)
 PROGRESS NOTE    [name] M Laguna Jr.  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 - Spouse reports increased redness swelling and foul-smelling discharge from left foot over the past 1 week, no fevers or chills.  He has had multiple prior toe amputations, presented [facility], sent to [facility] for further evaluation. - Vital signs stable, no labs or imaging available at this time    Assessment & Plan:   Principal Problem:   Osteomyelitis of great toe of right foot (HCC) Active Problems:   Type 1 diabetes mellitus with complications (HCC)   Essential hypertension   Multiple sclerosis (HCC)   Diabetic polyneuropathy (HCC)   Late effects of cerebrovascular accident   Coronary artery disease involving native coronary artery   History of CVA (cerebrovascular accident)   Osteomyelitis (HCC)   Osteomyelitis of toe of left foot (HCC)    Osteomyelitis of third toe, left foot Cellulitis left foot status post amputation of the third toe at MPJ level of the left foot on 06/21/2023. Continue current antibiotics cefepime  and vancomycin  monitor renal functions closely. CRP 5.1. MRI not done discussed with podiatry.    Type 1 diabetes mellitus with complications (HCC)-was hypoglycemic to 42 today the lowest.  Will decrease NPH to 10 and 4 units.  And NovoLog  3 units 3 times daily with meals. CBG (last 3)  Recent Labs    06/21/23 1245 06/21/23 1305 06/21/23 1409  GLUCAP 67* 129* 102*      Essential hypertension - Continue amlodipine      Multiple sclerosis (HCC)   Diabetic polyneuropathy (HCC)   - Very poor functional status   Chronic pain Narcotic dependence -On high doses narcotics including  Nucynta  and oxycodone  at baseline, home regimen resumed     Late effects of cerebrovascular accident -Continue Plavix , statin     Coronary artery disease  - Continue aspirin , Plavix , metoprolol , statin   COPD/chronic respiratory failure On 2 L home O2 at baseline   Pressure Ulcer 04/21/13 Stage II -  Partial thickness loss of dermis presenting as a shallow open ulcer with a red, pink wound bed without slough. (Active)  04/21/13   Location: Chest  Location Orientation: Left  Staging: Stage II -  Partial thickness loss of dermis presenting as a shallow open ulcer with a red, pink wound bed without slough.  Wound Description (Comments):   Present on Admission: Yes     Pressure Injury 06/21/23 Coccyx Posterior;Lower Stage 2 -  Partial thickness loss of dermis presenting as a shallow open injury with a red, pink wound bed without slough. (Active)  06/21/23 1501  Location: Coccyx  Location Orientation: Posterior;Lower  Staging: Stage 2 -  Partial thickness loss of dermis presenting as a shallow open injury with a red, pink wound bed without slough.  Wound Description (Comments):   Present on Admission: Yes  Dressing Type None 06/21/23 1500    Estimated body mass index is 26.64 kg/m as calculated from the following:   Height as of this encounter: 6' (1.829 m).   Weight as of this encounter: 89.1 kg.  DVT prophylaxis: (Lovenox /Heparin/SCD's/anticoagulated/None (if comfort care) Code Status: DNR Family Communication: None at bedside  disposition Plan:  Status is: Inpatient Remains inpatient appropriate because: Acute  illness   Consultants:  Podiatric   Procedures: Amputation of the left third toe Antimicrobials: Vancomycin  and cefepime   Subjective: Patient seen prior to surgery was n.p.o. was hypoglycemic this morning started D5 since he is n.p.o.  Objective: Vitals:   06/21/23 1410 06/21/23 1415 06/21/23 1420 06/21/23 1449  BP: (!) 148/107 (!) 142/103 (!) 140/73 (!)  126/112  Pulse: (!) 54 (!) 52 (!) 53 (!) 54  Resp: 19 10 12 17   Temp: 97.9 F (36.6 C)  97.9 F (36.6 C) 98.1 F (36.7 C)  TempSrc:    Oral  SpO2: 93% 96% 95% 96%  Weight:      Height:        Intake/Output Summary (Last 24 hours) at 06/21/2023 1546 Last data filed at 06/21/2023 1500 Gross per 24 hour  Intake 540 ml  Output 2155 ml  Net -1615 ml   Filed Weights   06/20/23 1740  Weight: 89.1 kg    Examination:  General exam: Appears frail elderly chronically ill-appearing Respiratory system: Clear to auscultation. Respiratory effort normal. Cardiovascular system: Regular rate  gastrointestinal system: Abdomen is nondistended, soft and nontender. No organomegaly or masses felt. Normal bowel sounds heard. Central nervous system: Alert and oriented.  Extremities: Left foot third toe covered with dressing purulent appearing foul-smelling drainage with erythema noted   Data Reviewed: I have personally reviewed following labs and imaging studies  CBC: Recent Labs  Lab 06/20/23 2045 06/21/23 0313  WBC 8.2 6.8  HGB 13.7 12.9*  HCT 41.7 40.0  MCV 98.8 99.8  PLT 124* 116*   Basic Metabolic Panel: Recent Labs  Lab 06/20/23 2045 06/20/23 2046 06/21/23 0313  NA 136  --  135  K 4.8  --  4.2  CL 99  --  102  CO2 31  --  30  GLUCOSE 162*  --  135*  BUN 23  --  21  CREATININE 1.35* 1.36* 1.26*  CALCIUM  8.5*  --  8.0*   GFR: Estimated Creatinine Clearance: 55.6 mL/min (A) (by C-G formula based on SCr of 1.26 mg/dL (H)). Liver Function Tests: Recent Labs  Lab 06/20/23 2045 06/21/23 0313  AST 21 20  ALT 16 14  ALKPHOS 281* 246*  BILITOT 0.9 0.7  PROT 7.3 6.3*  ALBUMIN  2.3* 2.0*   No results for input(s): "LIPASE", "AMYLASE" in the last 168 hours. No results for input(s): "AMMONIA" in the last 168 hours. Coagulation Profile: Recent Labs  Lab 06/21/23 0313  INR 1.1   Cardiac Enzymes: No results for input(s): "CKTOTAL", "CKMB", "CKMBINDEX", "TROPONINI" in  the last 168 hours. BNP (last 3 results) No results for input(s): "PROBNP" in the last 8760 hours. HbA1C: No results for input(s): "HGBA1C" in the last 72 hours. CBG: Recent Labs  Lab 06/21/23 1007 06/21/23 1052 06/21/23 1245 06/21/23 1305 06/21/23 1409  GLUCAP 42* 104* 67* 129* 102*   Lipid Profile: No results for input(s): "CHOL", "HDL", "LDLCALC", "TRIG", "CHOLHDL", "LDLDIRECT" in the last 72 hours. Thyroid  Function Tests: No results for input(s): "TSH", "T4TOTAL", "FREET4", "T3FREE", "THYROIDAB" in the last 72 hours. Anemia Panel: No results for input(s): "VITAMINB12", "FOLATE", "FERRITIN", "TIBC", "IRON", "RETICCTPCT" in the last 72 hours. Sepsis Labs: No results for input(s): "PROCALCITON", "LATICACIDVEN" in the last 168 hours.  No results found for this or any previous visit (from the past 240 hours).       Radiology Studies: DG Foot 2 Views Left Result Date: 06/21/2023 CLINICAL DATA:  Status post left 3rd toe amputation. EXAM: LEFT FOOT -  2 VIEW COMPARISON:  06/20/2023. FINDINGS: Interval surgical absence of the left 3rd toe with stable previous post amputation changes involving the left 1st and 2nd toes. Overlying bandage material. No fracture or dislocation. Again noted is flattening of the normal plantar arch, diffuse osteopenia and moderate inferior calcaneal enthesophyte formation. IMPRESSION: 1. Interval surgical absence of the left 3rd toe. 2. Stable previous post amputation changes involving the left 1st and 2nd toes. 3. Pes planus. 4. Diffuse osteopenia. Electronically Signed   By: Catherin Closs M.D.   On: 06/21/2023 14:39   VAS US  ABI WITH/WO TBI Result Date: 06/21/2023  LOWER EXTREMITY DOPPLER STUDY Patient Name:  [name]  Date of Exam:   06/21/2023 Medical Rec #: [id number]      Accession #:    [id number] Date of Birth: March 24, 1945      Patient Gender: M Patient Age:   60 years Exam Location:  [facility] Procedure:      VAS US  ABI WITH/WO TBI Referring  Phys: --------------------------------------------------------------------------------  Indications: Osteomyelitis of 3rd left toe - scheculed for amputation today.              Cellulitis. Right great toe 03/01/23, left 2nd toe 02/28/22  Limitations: Today's exam was limited due to bandages and left lower ankle and              foot. Performing Technologist: Franky Ivanoff Sturdivant-Jones RDMS, RVT  Examination Guidelines: A complete evaluation includes at minimum, Doppler waveform signals and systolic blood pressure reading at the level of bilateral brachial, anterior tibial, and posterior tibial arteries, when vessel segments are accessible. Bilateral testing is considered an integral part of a complete examination. Photoelectric Plethysmograph (PPG) waveforms and toe systolic pressure readings are included as required and additional duplex testing as needed. Limited examinations for reoccurring indications may be performed as noted.  ABI Findings: +---------+------------------+-----+---------+------------------+ Right    Rt Pressure (mmHg)IndexWaveform Comment            +---------+------------------+-----+---------+------------------+ Brachial                        triphasicIV upper right arm +---------+------------------+-----+---------+------------------+ PTA      254               1.56 triphasic                   +---------+------------------+-----+---------+------------------+ DP       254               1.56 triphasic                   +---------+------------------+-----+---------+------------------+ Great Toe                                amputated          +---------+------------------+-----+---------+------------------+ +---------+------------------+-----+---------+-----------------+ Left     Lt Pressure (mmHg)IndexWaveform Comment           +---------+------------------+-----+---------+-----------------+ Brachial 163                    triphasic                   +---------+------------------+-----+---------+-----------------+ PTA      164               1.01 biphasic                   +---------+------------------+-----+---------+-----------------+  DP       151               0.93 biphasic                   +---------+------------------+-----+---------+-----------------+ Great Toe                                bandages in place +---------+------------------+-----+---------+-----------------+ +-------+-----------+-----------+------------+------------+ ABI/TBIToday's ABIToday's TBIPrevious ABIPrevious TBI +-------+-----------+-----------+------------+------------+ Right  [place]                    [place]          0.76         +-------+-----------+-----------+------------+------------+ Left   1.01                  [place]          0.77         +-------+-----------+-----------+------------+------------+ Arterial wall calcification precludes accurate ankle pressures and ABIs.  Summary: Right: Resting right ankle-brachial index indicates noncompressible right lower extremity arteries. Left: Resting left ankle-brachial index is within normal range. *See table(s) above for measurements and observations.  Electronically signed by Jimmye Moulds MD on 06/21/2023 at 2:17:24 PM.    Final    DG Foot Complete Left Result Date: 06/20/2023 CLINICAL DATA:  Left foot infection. Clinical concern for osteomyelitis. EXAM: LEFT FOOT - COMPLETE 3+ VIEW COMPARISON:  03/01/2023 FINDINGS: Extensive bandage material on the left foot and ankle. Post amputation changes of the 1st and 2nd digits are again demonstrated with well-defined bone margins. No bone destruction or periosteal reaction. No soft tissue gas. Diffuse osteopenia. Flattening of the normal plantar arch. Moderate inferior calcaneal spur formation. IMPRESSION: 1. No radiographic evidence of osteomyelitis. 2. Post amputation changes of the 1st and 2nd digits. 3. Diffuse osteopenia. 4. Pes planus. Electronically  Signed   By: Catherin Closs M.D.   On: 06/20/2023 18:32   Scheduled Meds:  amLODipine   5 mg Oral q morning   aspirin   81 mg Oral Daily   atorvastatin   10 mg Oral QHS   clopidogrel   75 mg Oral Daily   DULoxetine   60 mg Oral BID   enoxaparin  (LOVENOX ) injection  40 mg Subcutaneous Q24H   finasteride   5 mg Oral Daily   Gerhardt's butt cream   Topical TID   insulin  aspart  0-15 Units Subcutaneous TID WC   insulin  aspart  0-5 Units Subcutaneous QHS   insulin  aspart  3 Units Subcutaneous TID WC   insulin  NPH Human  20 Units Subcutaneous QAC breakfast   And   insulin  NPH Human  10 Units Subcutaneous QHS   metoprolol  tartrate  25 mg Oral BID   sodium chloride  flush  10-40 mL Intracatheter Q12H   sodium chloride  flush  10-40 mL Intracatheter Q12H   tapentadol   200 mg Oral BID   traZODone   150 mg Oral QHS   Continuous Infusions:  ceFEPime  (MAXIPIME ) IV 2 g (06/21/23 1029)   vancomycin        LOS: 1 day    Time spent: 50 min Barbee Lew, MD 06/21/2023, 3:46 PM

## 2023-06-21 NOTE — Anesthesia Preprocedure Evaluation (Addendum)
 Anesthesia Evaluation  Patient identified by MRN, date of birth, ID band Patient awake    Reviewed: Allergy & Precautions, NPO status , [name] Chart, lab work & pertinent test results, reviewed documented beta blocker date and time   History of Anesthesia Complications (+) PONV  Airway Mallampati: II  TM Distance: >3 FB Neck ROM: Full    Dental  (+) Edentulous Lower, Edentulous Upper   Pulmonary former smoker   breath sounds clear to auscultation       Cardiovascular hypertension, Pt. on medications and Pt. on home beta blockers + CAD and + Cardiac Stents (RCA)   Rhythm:Regular Rate:Normal     Neuro/Psych    Depression    Multiple sclerosis CVA (L weakness), Residual Symptoms    GI/Hepatic negative GI ROS, Neg liver ROS,,,  Endo/Other  diabetes (glu 67), Insulin  Dependent    Renal/GU Renal InsufficiencyRenal disease     Musculoskeletal   Abdominal   Peds  Hematology Plavix  Hb 12.9, plt 116k   Anesthesia Other Findings   Reproductive/Obstetrics                             Anesthesia Physical Anesthesia Plan  ASA: 4  Anesthesia Plan: MAC   Post-op Pain Management: Minimal or no pain anticipated   Induction:   PONV Risk Score and Plan: 2 and Treatment may vary due to age or medical condition and Ondansetron   Airway Management Planned: Natural Airway and Simple Face Mask  Additional Equipment: None  Intra-op Plan:   Post-operative Plan:   Informed Consent: I have reviewed the patients History and Physical, chart, labs and discussed the procedure including the risks, benefits and alternatives for the proposed anesthesia with the patient or authorized representative who has indicated his/her understanding and acceptance.   Patient has DNR.  Discussed DNR with patient and Suspend DNR.     Plan Discussed with: CRNA and Surgeon  Anesthesia Plan Comments:         Anesthesia Quick Evaluation

## 2023-06-21 NOTE — Transfer of Care (Signed)
 Immediate Anesthesia Transfer of Care Note  Patient: [name] Pisano Spillman Jr.  Procedure(s) Performed: Left 3rd toe amputation (Left: Toe)  Patient Location: PACU  Anesthesia Type:MAC  Level of Consciousness: awake, alert , and oriented  Airway & Oxygen  Therapy: Patient connected to nasal cannula oxygen   Post-op Assessment: Report given to RN, Post -op Vital signs reviewed and stable, and Patient moving all extremities  Post vital signs: Reviewed and stable  Last Vitals:  Vitals Value Taken Time  BP 148/107 06/21/23 1406  Temp    Pulse 54 06/21/23 1408  Resp 17 06/21/23 1408  SpO2 93 % 06/21/23 1408  Vitals shown include unfiled device data.  Last Pain:  Vitals:   06/21/23 1234  TempSrc: Oral  PainSc: 2          Complications: No notable events documented.

## 2023-06-21 NOTE — Anesthesia Postprocedure Evaluation (Signed)
 Anesthesia Post Note  Patient: Danni M Mihalko Jr.  Procedure(s) Performed: Left 3rd toe amputation (Left: Toe)     Patient location during evaluation: PACU Anesthesia Type: General Level of consciousness: awake and alert, patient cooperative and oriented Pain management: pain level controlled Vital Signs Assessment: post-procedure vital signs reviewed and stable Respiratory status: spontaneous breathing, nonlabored ventilation and respiratory function stable Cardiovascular status: blood pressure returned to baseline and stable Postop Assessment: no apparent nausea or vomiting and adequate PO intake Anesthetic complications: no   No notable events documented.  Last Vitals:  Vitals:   06/21/23 1410 06/21/23 1415  BP: (!) 148/107 (!) 142/103  Pulse: (!) 54 (!) 52  Resp: 19 10  Temp: 36.6 C   SpO2: 93% 96%    Last Pain:  Vitals:   06/21/23 1234  TempSrc: Oral  PainSc: 2                  Itsel Opfer,E. Carlethia Mesquita

## 2023-06-21 NOTE — Progress Notes (Signed)
 Orthopedic Tech Progress Note Patient Details:  [name] Rodino Rueth Jr. 1945-12-20 [id number]  Ortho Devices Type of Ortho Device: Postop shoe/boot Ortho Device/Splint Location: LLE Ortho Device/Splint Interventions: Application   Post Interventions Patient Tolerated: Well  [name] 06/21/2023, 3:30 PM

## 2023-06-21 NOTE — Op Note (Signed)
 Full Operative Report  Date of Operation: 1:33 PM, 06/21/2023   Patient: [name]. - 78 y.o. male  Surgeon: Evertt Hoe, DPM   Assistant: None  Diagnosis: Osteomyelitis  Procedure:  1. Amputation of 3rd toe at MPJ level, left foot    Anesthesia: Anesthesia type not filed in the log.  Jonne Netters, MD  Anesthesiologist: Jonne Netters, MD CRNA: Pasty Bongo, CRNA   Estimated Blood Loss: 10 mL  Hemostasis: 1) Anatomical dissection, mechanical compression, electrocautery 2) no tourniquet was used during procedure  Implants: * No implants in log *  Materials: Prolene 3-0  Injectables: 1) Pre-operatively: 10 cc of 50:50 mixture 1%lidocaine  plain and 0.5% marcaine  plain 2) Post-operatively: None   Specimens: - Pathology: Left third toe - Microbiology: Bone culture proximal phalanx third toe   Antibiotics: IV antibiotics given per schedule on the floor  Drains: None  Complications: Patient tolerated the procedure well without complication.   Operative findings: As below in detailed report  Indications for Procedure: [name] M Ladouceur Jr. presents to Evertt Hoe, DPM with a chief complaint of circular ulceration dorsal left third toe with concern for underlying osteomyelitis and cellulitis of the left foot.  The patient has failed conservative treatments of various modalities. At this time the patient has elected to proceed with surgical correction. All alternatives, risks, and complications of the procedures were thoroughly explained to the patient. Patient exhibits appropriate understanding of all discussion points and informed consent was signed and obtained in the chart with no guarantees to surgical outcome given or implied.  Description of Procedure: Patient was brought to the operating room. Patient remained on their hospital bed in the supine position. A surgical timeout was performed and all members of the operating room, the  procedure, and the surgical site were identified. anesthesia occurred as per anesthesia record. Local anesthetic as previously described was then injected about the operative field in a local infiltrative block.  The operative lower extremity as noted above was then prepped and draped in the usual sterile manner. The following procedure then began.  Attention was directed to the third digit on the left foot. A full-thickness incision encompassing the entire digit was made using a #15 blade. Dissection was carried down to bone. The toe was secured with a towel clamp, further dissected in its entirety, and disarticulated at the MPJ and passed to the back table as a gross specimen. This was then labled and sent to pathology. The bone was noted to be soft and eroded, and consistent with osteomyelitis.  A bone culture was harvested with rongeur from the proximal phalanx underneath the site of ulceration the bone was soft and necrotic.  All remaining necrotic and devitalized soft tissue structures were visualized and dissected away using sharp and dull dissection. Care was taken to protect all neurovascular structures throughout the dissection. All bleeders were cauterized as necessary.  The area was then flushed with copious amounts of sterile saline. Then using the suture materials previously described, the site was closed in anatomic layers and the skin was well approximated under minimal tension.  The surgical site was then dressed with Xeroform 4 x 4 Kerlix Ace. The patient tolerated both the procedure and anesthesia well with vital signs stable throughout. The patient was transferred in good condition and all vital signs stable  from the OR to recovery under the discretion of anesthesia.  Condition: Vital signs stable, neurovascular status unchanged from preoperative   Surgical plan:  Expect  clean margin.  Weightbearing as tolerated in postop shoe.  Likely okay for short course p.o. antibiotics and  discharge follow cultures  The patient will be weightbearing as tolerated in a postop shoe to the operative limb until further instructed. The dressing is to remain clean, dry, and intact. Will continue to follow unless noted elsewhere.   Russ Course, DPM [facility]

## 2023-06-22 ENCOUNTER — Encounter (HOSPITAL_COMMUNITY)

## 2023-06-22 ENCOUNTER — Encounter (HOSPITAL_COMMUNITY): Payer: Self-pay | Admitting: Podiatry

## 2023-06-22 DIAGNOSIS — M869 Osteomyelitis, unspecified: Secondary | ICD-10-CM | POA: Diagnosis not present

## 2023-06-22 LAB — GLUCOSE, CAPILLARY
Glucose-Capillary: 206 mg/dL — ABNORMAL HIGH (ref 70–99)
Glucose-Capillary: 212 mg/dL — ABNORMAL HIGH (ref 70–99)
Glucose-Capillary: 85 mg/dL (ref 70–99)
Glucose-Capillary: 54 mg/dL — ABNORMAL LOW (ref 70–99)
Glucose-Capillary: 76 mg/dL (ref 70–99)
Glucose-Capillary: 152 mg/dL — ABNORMAL HIGH (ref 70–99)

## 2023-06-22 LAB — BASIC METABOLIC PANEL WITH GFR
Anion gap: 3 — ABNORMAL LOW (ref 5–15)
BUN: 21 mg/dL (ref 8–23)
CO2: 29 mmol/L (ref 22–32)
Calcium: 7.9 mg/dL — ABNORMAL LOW (ref 8.9–10.3)
Chloride: 102 mmol/L (ref 98–111)
Creatinine, Ser: 1.27 mg/dL — ABNORMAL HIGH (ref 0.61–1.24)
GFR, Estimated: 59 mL/min — ABNORMAL LOW (ref >60)
Glucose, Bld: 191 mg/dL — ABNORMAL HIGH (ref 70–99)
Potassium: 4.5 mmol/L (ref 3.5–5.1)
Sodium: 134 mmol/L — ABNORMAL LOW (ref 135–145)

## 2023-06-22 LAB — HEMOGLOBIN A1C
Hgb A1c MFr Bld: 6.4 % — ABNORMAL HIGH (ref 4.8–5.6)
Mean Plasma Glucose: 137 mg/dL

## 2023-06-22 LAB — CBC
HCT: 37 % — ABNORMAL LOW (ref 39.0–52.0)
Hemoglobin: 12 g/dL — ABNORMAL LOW (ref 13.0–17.0)
MCH: 32.6 pg (ref 26.0–34.0)
MCHC: 32.4 g/dL (ref 30.0–36.0)
MCV: 100.5 fL — ABNORMAL HIGH (ref 80.0–100.0)
Platelets: 106 10*3/uL — ABNORMAL LOW (ref 150–400)
RBC: 3.68 MIL/uL — ABNORMAL LOW (ref 4.22–5.81)
RDW: 14.4 % (ref 11.5–15.5)
WBC: 6.7 10*3/uL (ref 4.0–10.5)
nRBC: 0 % (ref 0.0–0.2)

## 2023-06-22 MED ORDER — POLYETHYLENE GLYCOL 3350 17 G PO PACK
17.0000 g | PACK | Freq: Two times a day (BID) | ORAL | Status: DC
  Administered 2023-06-22 – 2023-06-23 (×3): 17 g via ORAL
  Filled 2023-06-22 (×3): qty 1

## 2023-06-22 MED ORDER — SENNOSIDES-DOCUSATE SODIUM 8.6-50 MG PO TABS
2.0000 | ORAL_TABLET | Freq: Every day | ORAL | Status: DC
  Administered 2023-06-22: 2 via ORAL
  Filled 2023-06-22: qty 2

## 2023-06-22 NOTE — Progress Notes (Signed)
  Subjective:  Patient ID: Gwynn Lesches., male    DOB: 1945-04-21,  MRN: [id number]  DOS: 06/21/23 Procedure: Left 3rd toe amputation at MPJ level  78 y.o. male seen for post op check. He reports doing well denies pain, hoping to go home today.   Review of Systems: Negative except as noted in the HPI. Denies N/V/F/Ch.   Objective:   Constitutional Well developed. Well nourished.  Vascular Foot warm and well perfused. Capillary refill normal to all digits.   No calf pain with palpation  Neurologic Normal speech. Oriented to person, place, and time. Epicritic sensation diminished to left foot  Dermatologic Dressing C/d/I  Orthopedic: S/p L 3rd toe amp, prior L 1st-2nd toe and r hallux amps.   Radiographs: Disarticulation of 3rd toe at MPJ level left foot  Pathology: pending  Micro: pending  Assessment:   Osteomyelitis of left 3rd toe, cellulitis l foot, s/p 3rd toe amputation L  Plan:  Patient was evaluated and treated and all questions answered.  POD # 1 s/p L 3rd toe amputation at MPJ level -Progressing as expected post op, no strikethrough or pain.  -XR: expected post op changes -WB Status: WBAT in post op shoe - should not wear while in bed -Sutures: remain intact 2-3 weeks. -Medications/ABX: Recommend short course PO abx on DC, Augmentin  ok for 7 days -Dressing: Remain intact until follow up - F/u Plan: Pt to f/u next week Thursday, office to call to arrange. Stable for DC from my standpoint. Will sign off.        [name], DPM [facility] / [facility]

## 2023-06-22 NOTE — Progress Notes (Addendum)
 Hypoglycemic Event  CBG: 54  Treatment: 4 oz juice/soda  Symptoms: Pale, Shaky, and Nervous/irritable  Follow-up CBG: Time:1740 CBG Result:74  Possible Reasons for Event: Unknown  Comments/MD notified:Dr. Augustus Ledger made aware. Verbal order given to this nurse to hold long acting insulin  ordered to be given tonight at bedtime. MAR updated    [name]

## 2023-06-22 NOTE — Care Management Important Message (Signed)
 Important Message  Patient Details  Name: [name] Sixkiller Hissong Jr. MRN: [id number] Date of Birth: [birth year]   Important Message Given:  Yes - Medicare IM     Felix Host 06/22/2023, 10:46 AM

## 2023-06-22 NOTE — Progress Notes (Addendum)
 PROGRESS NOTE    [name] M Wease Jr.  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 - Spouse reports increased redness swelling and foul-smelling discharge from left foot over the past 1 week, no fevers or chills.  He has had multiple prior toe amputations, presented [facility], sent to [facility] for further evaluation. - Vital signs stable, no labs or imaging available at this time    Assessment & Plan:   Principal Problem:   Osteomyelitis of great toe of right foot (HCC) Active Problems:   Type 1 diabetes mellitus with complications (HCC)   Essential hypertension   Multiple sclerosis (HCC)   Diabetic polyneuropathy (HCC)   Late effects of cerebrovascular accident   Coronary artery disease involving native coronary artery   History of CVA (cerebrovascular accident)   Osteomyelitis (HCC)   Osteomyelitis of toe of left foot (HCC)    Osteomyelitis of third toe, left foot Cellulitis left foot status post amputation of the third toe at MPJ level of the left foot on 06/21/2023.  Recommendations from podiatry include weightbearing as tolerated in postop shoe and dressings to remain clean dry and intact, continue short course of p.o. antibiotics and consider discharge.  PT evaluation pending.  Cultures are pending.  Gram stain shows no WBCs or organisms. Continue current antibiotics cefepime  and vancomycin  monitor renal functions closely. CRP 5.1.    Type 1 diabetes mellitus with complications (HCC)-was hypoglycemic to 42 today the lowest.  Will decrease NPH to 10 and 4 units.  And NovoLog  3 units 3 times daily with meals. CBG (last 3)  Recent Labs    06/21/23 1611 06/21/23 2128 06/22/23 0609  GLUCAP  207* 198* 206*      Essential hypertension - Continue amlodipine      Multiple sclerosis (HCC)   Diabetic polyneuropathy (HCC)   - Very poor functional status   Chronic pain Narcotic dependence -On high doses narcotics including Nucynta  and oxycodone  at baseline, home regimen resumed     Late effects of cerebrovascular accident -Continue Plavix , statin     Coronary artery disease  - Continue aspirin , Plavix , metoprolol , statin   COPD/chronic respiratory failure On 2 L home O2 at baseline   Pressure Ulcer 04/21/13 Stage II -  Partial thickness loss of dermis presenting as a shallow open ulcer with a red, pink wound bed without slough. (Active)  04/21/13   Location: Chest  Location Orientation: Left  Staging: Stage II -  Partial thickness loss of dermis presenting as a shallow open ulcer with a red, pink wound bed without slough.  Wound Description (Comments):   Present on Admission: Yes     Pressure Injury 06/21/23 Coccyx Posterior;Lower Stage 2 -  Partial thickness loss of dermis presenting as a shallow open injury with a red, pink wound bed without slough. (Active)  06/21/23 1501  Location: Coccyx  Location Orientation: Posterior;Lower  Staging: Stage 2 -  Partial thickness loss of dermis presenting as a shallow open injury with a red, pink wound bed without slough.  Wound Description (Comments):   Present on Admission: Yes  Dressing Type None 06/21/23 2015    Estimated body mass index is 26.64 kg/m as calculated from the following:   Height as of this encounter: 6' (  1.829 m).   Weight as of this encounter: 89.1 kg.  DVT prophylaxis: Lovenox  Code Status: DNR Family Communication: None at bedside  disposition Plan:  Status is: Inpatient Remains inpatient appropriate because: Acute illness   Consultants:  Podiatric   Procedures: Amputation of the left third toe Antimicrobials: Vancomycin  and cefepime   Subjective: He is resting in bed very anxious to go  home Noted platelets trending down on aspirin  Plavix  Lovenox  Objective: Vitals:   06/21/23 1957 06/21/23 2305 06/22/23 0448 06/22/23 0753  BP: (!) 154/45 (!) 141/72 (!) 131/53 (!) 118/55  Pulse: (!) 54 (!) 58 86 63  Resp: 18 19 17 18   Temp: 98.4 F (36.9 C) 98 F (36.7 C) 98.1 F (36.7 C) 98.2 F (36.8 C)  TempSrc: Oral Oral Oral   SpO2: 96% 97% 94% 91%  Weight:      Height:        Intake/Output Summary (Last 24 hours) at 06/22/2023 1050 Last data filed at 06/22/2023 0900 Gross per 24 hour  Intake 1140 ml  Output 1805 ml  Net -665 ml   Filed Weights   06/20/23 1740  Weight: 89.1 kg    Examination:  General exam: Appears frail elderly chronically ill-appearing Respiratory system: Clear to auscultation. Respiratory effort normal. Cardiovascular system: Regular rate  gastrointestinal system: Abdomen is nondistended, soft and nontender. No organomegaly or masses felt. Normal bowel sounds heard. Central nervous system: Alert and oriented.  Extremities: Left foot third toe covered with dressing purulent appearing foul-smelling drainage with erythema noted   Data Reviewed: I have personally reviewed following labs and imaging studies  CBC: Recent Labs  Lab 06/20/23 2045 06/21/23 0313 06/22/23 0500  WBC 8.2 6.8 6.7  HGB 13.7 12.9* 12.0*  HCT 41.7 40.0 37.0*  MCV 98.8 99.8 100.5*  PLT 124* 116* 106*   Basic Metabolic Panel: Recent Labs  Lab 06/20/23 2045 06/20/23 2046 06/21/23 0313 06/22/23 0500  NA 136  --  135 134*  K 4.8  --  4.2 4.5  CL 99  --  102 102  CO2 31  --  30 29  GLUCOSE 162*  --  135* 191*  BUN 23  --  21 21  CREATININE 1.35* 1.36* 1.26* 1.27*  CALCIUM  8.5*  --  8.0* 7.9*   GFR: Estimated Creatinine Clearance: 55.2 mL/min (A) (by C-G formula based on SCr of 1.27 mg/dL (H)). Liver Function Tests: Recent Labs  Lab 06/20/23 2045 06/21/23 0313  AST 21 20  ALT 16 14  ALKPHOS 281* 246*  BILITOT 0.9 0.7  PROT 7.3 6.3*  ALBUMIN  2.3* 2.0*    No results for input(s): "LIPASE", "AMYLASE" in the last 168 hours. No results for input(s): "AMMONIA" in the last 168 hours. Coagulation Profile: Recent Labs  Lab 06/21/23 0313  INR 1.1   Cardiac Enzymes: No results for input(s): "CKTOTAL", "CKMB", "CKMBINDEX", "TROPONINI" in the last 168 hours. BNP (last 3 results) No results for input(s): "PROBNP" in the last 8760 hours. HbA1C: Recent Labs    06/20/23 2045  HGBA1C 6.4*   CBG: Recent Labs  Lab 06/21/23 1305 06/21/23 1409 06/21/23 1611 06/21/23 2128 06/22/23 0609  GLUCAP 129* 102* 207* 198* 206*   Lipid Profile: No results for input(s): "CHOL", "HDL", "LDLCALC", "TRIG", "CHOLHDL", "LDLDIRECT" in the last 72 hours. Thyroid  Function Tests: No results for input(s): "TSH", "T4TOTAL", "FREET4", "T3FREE", "THYROIDAB" in the last 72 hours. Anemia Panel: No results for input(s): "VITAMINB12", "FOLATE", "FERRITIN", "TIBC", "IRON", "RETICCTPCT" in the last 72 hours. Sepsis  Labs: No results for input(s): "PROCALCITON", "LATICACIDVEN" in the last 168 hours.  Recent Results (from the past 240 hours)  Aerobic/Anaerobic Culture w Gram Stain (surgical/deep wound)     Status: None (Preliminary result)   Collection Time: 06/21/23  1:46 PM   Specimen: Foot, Left; Wound  Result Value Ref Range Status   Specimen Description TISSUE LEFT FOOT  Final   Special Requests PT ON MAXIPIME   Final   Gram Stain NO WBC SEEN NO ORGANISMS SEEN   Final   Culture   Final    CULTURE REINCUBATED FOR BETTER GROWTH Performed at [facility], [address], [place], [place] [address]    Report Status PENDING  Incomplete         Radiology Studies: DG Foot 2 Views Left Result Date: 06/21/2023 CLINICAL DATA:  Status post left 3rd toe amputation. EXAM: LEFT FOOT - 2 VIEW COMPARISON:  06/20/2023. FINDINGS: Interval surgical absence of the left 3rd toe with stable previous post amputation changes involving the left 1st and 2nd toes. Overlying  bandage material. No fracture or dislocation. Again noted is flattening of the normal plantar arch, diffuse osteopenia and moderate inferior calcaneal enthesophyte formation. IMPRESSION: 1. Interval surgical absence of the left 3rd toe. 2. Stable previous post amputation changes involving the left 1st and 2nd toes. 3. Pes planus. 4. Diffuse osteopenia. Electronically Signed   By: Catherin Closs M.D.   On: 06/21/2023 14:39   VAS US  ABI WITH/WO TBI Result Date: 06/21/2023  LOWER EXTREMITY DOPPLER STUDY Patient Name:  Reko Traficante Pate  Date of Exam:   06/21/2023 Medical Rec #: [id number]      Accession #:    [id number] Date of Birth: 1945-12-30      Patient Gender: M Patient Age:   55 years Exam Location:  [facility] Procedure:      VAS US  ABI WITH/WO TBI Referring Phys: --------------------------------------------------------------------------------  Indications: Osteomyelitis of 3rd left toe - scheculed for amputation today.              Cellulitis. Right great toe 03/01/23, left 2nd toe 02/28/22  Limitations: Today's exam was limited due to bandages and left lower ankle and              foot. Performing Technologist: Franky Ivanoff Sturdivant-Jones RDMS, RVT  Examination Guidelines: A complete evaluation includes at minimum, Doppler waveform signals and systolic blood pressure reading at the level of bilateral brachial, anterior tibial, and posterior tibial arteries, when vessel segments are accessible. Bilateral testing is considered an integral part of a complete examination. Photoelectric Plethysmograph (PPG) waveforms and toe systolic pressure readings are included as required and additional duplex testing as needed. Limited examinations for reoccurring indications may be performed as noted.  ABI Findings: +---------+------------------+-----+---------+------------------+ Right    Rt Pressure (mmHg)IndexWaveform Comment            +---------+------------------+-----+---------+------------------+ Brachial                         triphasicIV upper right arm +---------+------------------+-----+---------+------------------+ PTA      254               1.56 triphasic                   +---------+------------------+-----+---------+------------------+ DP       254               1.56 triphasic                   +---------+------------------+-----+---------+------------------+  Great Toe                                amputated          +---------+------------------+-----+---------+------------------+ +---------+------------------+-----+---------+-----------------+ Left     Lt Pressure (mmHg)IndexWaveform Comment           +---------+------------------+-----+---------+-----------------+ Brachial 163                    triphasic                  +---------+------------------+-----+---------+-----------------+ PTA      164               1.01 biphasic                   +---------+------------------+-----+---------+-----------------+ DP       151               0.93 biphasic                   +---------+------------------+-----+---------+-----------------+ Great Toe                                bandages in place +---------+------------------+-----+---------+-----------------+ +-------+-----------+-----------+------------+------------+ ABI/TBIToday's ABIToday's TBIPrevious ABIPrevious TBI +-------+-----------+-----------+------------+------------+ Right  [name]                    [name]          0.76         +-------+-----------+-----------+------------+------------+ Left   1.01                            0.77         +-------+-----------+-----------+------------+------------+ Arterial wall calcification precludes accurate ankle pressures and ABIs.  Summary: Right: Resting right ankle-brachial index indicates noncompressible right lower extremity arteries. Left: Resting left ankle-brachial index is within normal range. *See table(s) above for measurements and observations.   Electronically signed by Jimmye Moulds MD on 06/21/2023 at 2:17:24 PM.    Final    DG Foot Complete Left Result Date: 06/20/2023 CLINICAL DATA:  Left foot infection. Clinical concern for osteomyelitis. EXAM: LEFT FOOT - COMPLETE 3+ VIEW COMPARISON:  03/01/2023 FINDINGS: Extensive bandage material on the left foot and ankle. Post amputation changes of the 1st and 2nd digits are again demonstrated with well-defined bone margins. No bone destruction or periosteal reaction. No soft tissue gas. Diffuse osteopenia. Flattening of the normal plantar arch. Moderate inferior calcaneal spur formation. IMPRESSION: 1. No radiographic evidence of osteomyelitis. 2. Post amputation changes of the 1st and 2nd digits. 3. Diffuse osteopenia. 4. Pes planus. Electronically Signed   By: Catherin Closs M.D.   On: 06/20/2023 18:32   Scheduled Meds:  amLODipine   5 mg Oral q morning   aspirin   81 mg Oral Daily   atorvastatin   10 mg Oral QHS   clopidogrel   75 mg Oral Daily   DULoxetine   60 mg Oral BID   enoxaparin  (LOVENOX ) injection  40 mg Subcutaneous Q24H   finasteride   5 mg Oral Daily   Gerhardt's butt cream   Topical TID   insulin  aspart  0-15 Units Subcutaneous TID WC   insulin  aspart  0-5 Units Subcutaneous QHS   insulin  aspart  3 Units Subcutaneous TID WC   insulin  NPH Human  10 Units Subcutaneous QAC breakfast  And   insulin  NPH Human  4 Units Subcutaneous QHS   metoprolol  tartrate  25 mg Oral BID   sodium chloride  flush  10-40 mL Intracatheter Q12H   sodium chloride  flush  10-40 mL Intracatheter Q12H   tapentadol   200 mg Oral BID   traZODone   150 mg Oral QHS   Continuous Infusions:  ceFEPime  (MAXIPIME ) IV 2 g (06/22/23 0827)   vancomycin  1,500 mg (06/21/23 2139)     LOS: 2 days    Time spent: 50 min Barbee Lew, MD 06/22/2023, 10:50 AM

## 2023-06-22 NOTE — Plan of Care (Signed)

## 2023-06-23 ENCOUNTER — Other Ambulatory Visit (HOSPITAL_BASED_OUTPATIENT_CLINIC_OR_DEPARTMENT_OTHER): Payer: Self-pay

## 2023-06-23 ENCOUNTER — Other Ambulatory Visit (HOSPITAL_COMMUNITY): Payer: Self-pay

## 2023-06-23 DIAGNOSIS — M869 Osteomyelitis, unspecified: Secondary | ICD-10-CM | POA: Diagnosis not present

## 2023-06-23 LAB — BASIC METABOLIC PANEL WITH GFR
Anion gap: 7 (ref 5–15)
BUN: 22 mg/dL (ref 8–23)
CO2: 27 mmol/L (ref 22–32)
Calcium: 7.6 mg/dL — ABNORMAL LOW (ref 8.9–10.3)
Chloride: 99 mmol/L (ref 98–111)
Creatinine, Ser: 1.49 mg/dL — ABNORMAL HIGH (ref 0.61–1.24)
GFR, Estimated: 49 mL/min — ABNORMAL LOW (ref >60)
Glucose, Bld: 293 mg/dL — ABNORMAL HIGH (ref 70–99)
Potassium: 5.2 mmol/L — ABNORMAL HIGH (ref 3.5–5.1)
Sodium: 133 mmol/L — ABNORMAL LOW (ref 135–145)

## 2023-06-23 LAB — CBC
HCT: 35.4 % — ABNORMAL LOW (ref 39.0–52.0)
Hemoglobin: 11.7 g/dL — ABNORMAL LOW (ref 13.0–17.0)
MCH: 33.1 pg (ref 26.0–34.0)
MCHC: 33.1 g/dL (ref 30.0–36.0)
MCV: 100 fL (ref 80.0–100.0)
Platelets: 104 10*3/uL — ABNORMAL LOW (ref 150–400)
RBC: 3.54 MIL/uL — ABNORMAL LOW (ref 4.22–5.81)
RDW: 14.3 % (ref 11.5–15.5)
WBC: 6.6 10*3/uL (ref 4.0–10.5)
nRBC: 0 % (ref 0.0–0.2)

## 2023-06-23 LAB — GLUCOSE, CAPILLARY
Glucose-Capillary: 340 mg/dL — ABNORMAL HIGH (ref 70–99)
Glucose-Capillary: 231 mg/dL — ABNORMAL HIGH (ref 70–99)

## 2023-06-23 LAB — SURGICAL PATHOLOGY

## 2023-06-23 MED ORDER — SENNOSIDES-DOCUSATE SODIUM 8.6-50 MG PO TABS: 2.0000 | ORAL_TABLET | Freq: Every day | ORAL | Status: DC

## 2023-06-23 MED ORDER — PEG 3350 17 GM/SCOOP PO POWD
17.0000 g | Freq: Two times a day (BID) | ORAL | 0 refills | Status: AC
  Filled 2023-06-23: qty 238, 7d supply, fill #0

## 2023-06-23 MED ORDER — AMOXICILLIN-POT CLAVULANATE 875-125 MG PO TABS
1.0000 | ORAL_TABLET | Freq: Two times a day (BID) | ORAL | 0 refills | Status: AC
  Filled 2023-06-23: qty 14, 7d supply, fill #0

## 2023-06-23 MED ORDER — GERHARDT'S BUTT CREAM: 1.0000 | TOPICAL_CREAM | Freq: Three times a day (TID) | CUTANEOUS | Status: AC

## 2023-06-23 MED ORDER — SODIUM ZIRCONIUM CYCLOSILICATE 10 G PO PACK
10.0000 g | PACK | Freq: Once | ORAL | Status: AC
  Administered 2023-06-23: 10 g via ORAL
  Filled 2023-06-23: qty 1

## 2023-06-23 NOTE — Evaluation (Signed)
 Physical Therapy Evaluation Patient Details Name: [name] Loomis Pottle Jr. MRN: [id number] DOB: February 25, 1945 Today's Date: 06/23/2023  History of Present Illness  Pt is a 78 y.o. male admitted 5/13 with L foot osteomyelitis. He underwent L 3rd toe amp 5/14. PMH: multiple toe amp bilat feet, CKD II, CAD, IDDM with diabetic neuropathy, essential tremor, CVA with L residual hemiparesis, MI, HTN, MS   Clinical Impression  PT eval complete. PTA pt lived at home with his wife, mod I short household distances with rollator. On eval, pt demo mod I bed mobility. Min assist transfers, and min assist amb 10' with RW. Fair sitting balance and poor standing balance. Pt reports this is his baseline level for mobility. He states he has had multiple trials of HHPT and is not interested in follow up PT services upon d/c.  He does want HHRN for wound care.Plan is for d/c home today. No DME needs. PT signing off.      If plan is discharge home, recommend the following: A little help with walking and/or transfers;A little help with bathing/dressing/bathroom;Assistance with cooking/housework;Assist for transportation   Can travel by private vehicle        Equipment Recommendations None recommended by PT  Recommendations for Other Services       Functional Status Assessment Patient has not had a recent decline in their functional status     Precautions / Restrictions Precautions Precautions: Fall Required Braces or Orthoses: Other Brace Other Brace: L post op shoe Restrictions Weight Bearing Restrictions Per Provider Order: Yes LLE Weight Bearing Per Provider Order: Weight bearing as tolerated      Mobility  Bed Mobility Overal bed mobility: Modified Independent             General bed mobility comments: HOB elevated, +rails, increased time and effort    Transfers Overall transfer level: Needs assistance Equipment used: Rolling walker (2 wheels) Transfers: Sit to/from Stand Sit to Stand: Min  assist, From elevated surface           General transfer comment: multi attempts to power up    Ambulation/Gait Ambulation/Gait assistance: Min assist Gait Distance (Feet): 20 Feet Assistive device: Rolling walker (2 wheels) Gait Pattern/deviations: Step-through pattern, Ataxic, Decreased stride length, Drifts right/left Gait velocity: decreased Gait velocity interpretation: <1.31 ft/sec, indicative of household ambulator   General Gait Details: unsteady gait. Pt reports this is baseline. No recent falls.  Stairs            Wheelchair Mobility     Tilt Bed    Modified Rankin (Stroke Patients Only)       Balance Overall balance assessment: Needs assistance Sitting-balance support: No upper extremity supported, Feet supported Sitting balance-Leahy Scale: Fair     Standing balance support: Bilateral upper extremity supported, During functional activity, Reliant on assistive device for balance Standing balance-Leahy Scale: Poor                               Pertinent Vitals/Pain Pain Assessment Pain Assessment: No/denies pain    Home Living Family/patient expects to be discharged to:: Private residence Living Arrangements: Spouse/significant other Available Help at Discharge: Family;Available 24 hours/day;Neighbor Type of Home: House Home Access: Level entry       Home Layout: One level Home Equipment: Rollator (4 wheels);Electric scooter;Shower seat;BSC/3in1;Wheelchair - manual      Prior Function Prior Level of Function : Needs assist  Mobility Comments: short household amb with rollator, dependent w/c mobility increased distances ADLs Comments: Wife assists with bathing     Extremity/Trunk Assessment   Upper Extremity Assessment Upper Extremity Assessment: Generalized weakness;LUE deficits/detail LUE Deficits / Details: h/o CVA    Lower Extremity Assessment Lower Extremity Assessment: Generalized weakness;LLE  deficits/detail;RLE deficits/detail RLE Sensation: history of peripheral neuropathy LLE Deficits / Details: h/o CVA LLE Sensation: history of peripheral neuropathy    Cervical / Trunk Assessment Cervical / Trunk Assessment: Kyphotic  Communication   Communication Communication: No apparent difficulties    Cognition Arousal: Alert Behavior During Therapy: WFL for tasks assessed/performed   PT - Cognitive impairments: No apparent impairments                         Following commands: Intact       Cueing       General Comments General comments (skin integrity, edema, etc.): VSS on RA. Pt reports he only wear 2L O2 at night.    Exercises     Assessment/Plan    PT Assessment Patient does not need any further PT services  PT Problem List         PT Treatment Interventions      PT Goals (Current goals can be found in the Care Plan section)  Acute Rehab PT Goals Patient Stated Goal: home today PT Goal Formulation: All assessment and education complete, DC therapy    Frequency       Co-evaluation               AM-PAC PT "6 Clicks" Mobility  Outcome Measure Help needed turning from your back to your side while in a flat bed without using bedrails?: None Help needed moving from lying on your back to sitting on the side of a flat bed without using bedrails?: None Help needed moving to and from a bed to a chair (including a wheelchair)?: A Little Help needed standing up from a chair using your arms (e.g., wheelchair or bedside chair)?: A Little Help needed to walk in hospital room?: A Little Help needed climbing 3-5 steps with a railing? : A Lot 6 Click Score: 19    End of Session Equipment Utilized During Treatment: Gait belt Activity Tolerance: Patient tolerated treatment well Patient left: in bed;with call bell/phone within reach;with bed alarm set Nurse Communication: Mobility status PT Visit Diagnosis: Unsteadiness on feet (R26.81);Difficulty  in walking, not elsewhere classified (R26.2)    Time: 4098-1191 PT Time Calculation (min) (ACUTE ONLY): 16 min   Charges:   PT Evaluation $PT Eval Moderate Complexity: 1 Mod   PT General Charges $$ ACUTE PT VISIT: 1 Visit         Dorothye Gathers., PT  Office # [phone]   Guadelupe Leech 06/23/2023, 9:35 AM

## 2023-06-23 NOTE — Discharge Instructions (Signed)
 Leave the dressings on the foot as it is still followed by podiatry the dressings clean

## 2023-06-23 NOTE — Inpatient Diabetes Management (Signed)
 Inpatient Diabetes Program Recommendations  AACE/ADA: New Consensus Statement on Inpatient Glycemic Control  Target Ranges:  Prepandial:   less than 140 mg/dL      Peak postprandial:   less than 180 mg/dL (1-2 hours)      Critically ill patients:  140 - 180 mg/dL    Latest Reference Range & Units 06/22/23 06:09 06/22/23 11:48 06/22/23 16:12 06/22/23 17:25 06/22/23 17:40 06/22/23 20:13 06/23/23 06:35  Glucose-Capillary 70 - 99 mg/dL 130 (H) 865 (H) 85 54 (L) 76 152 (H) 340 (H)   Review of Glycemic Control  Diabetes history: DM1 Outpatient Diabetes medications: Humulin  N 20 units QAM, 10 units QPM, Humalog 0-15 units TID with meals Current orders for Inpatient glycemic control: NPH 10 units QAM, NPH 4 units at bedtime, Novolog  0-15 units TID with meals, Novolog  0-5 units at bedtime, Novolog  3 units TID with meals  Inpatient Diabetes Program Recommendations:    Insulin : CBG down to 54 mg/dl at 78:46 on 9/62. Anticipate hypoglycemia due to moderate scale Novolog  correction.   NPH 4 units held last night. As a result, glucose 340 mg/dl today.  Please decrease Novolog  correction to 0-9 units TID.  Thanks, Beacher Limerick, RN, MSN, CDCES Diabetes Coordinator Inpatient Diabetes Program [phone] (Team Pager from 8am to 5pm)

## 2023-06-23 NOTE — Discharge Summary (Signed)
 Physician Discharge Summary  [name] [name]. HYQ:[id number] DOB: Dec 09, 1945 DOA: 06/20/2023  PCP: Suan Elm, MD  Admit date: 06/20/2023 Discharge date: 06/23/2023  Admitted From: home Disposition: home Recommendations for Outpatient Follow-up:  Follow up with PCP in 1-2 weeks Please obtain BMP/CBC in one week Follow-up with Dr. Luster Salters podiatry next week office to call and arrange. 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 - Spouse reported increased redness swelling and foul-smelling discharge from left foot over the past 1 week, no fevers or chills.  He has had multiple prior toe amputations, presented to [facility].    Discharge Diagnoses:  Principal Problem:   Osteomyelitis of great toe of right foot (HCC) Active Problems:   Type 1 diabetes mellitus with complications (HCC)   Essential hypertension   Multiple sclerosis (HCC)   Diabetic polyneuropathy (HCC)   Late effects of cerebrovascular accident   Coronary artery disease involving native coronary artery   History of CVA (cerebrovascular accident)   Osteomyelitis (HCC)   Osteomyelitis of toe of left foot (HCC)   Osteomyelitis of third toe, left foot Cellulitis left foot status post amputation of the third toe at MPJ level of the left foot on 06/21/2023.  Recommendations from podiatry include weightbearing as tolerated in postop shoe and dressings to remain clean dry and intact, continue short course of p.o. antibiotics and consider discharge.  Cultures negative. Gram stain shows no WBCs or organisms. Treated with cefepime  and vancomycin  during hospital stay.      Type 1 diabetes mellitus with complications (HCC)-continue home insulin      Essential hypertension - Continue amlodipine      Multiple sclerosis (HCC)   Diabetic polyneuropathy (HCC)   - Very poor functional status   Chronic pain Narcotic dependence -On high doses narcotics including Nucynta  and oxycodone       Late effects of cerebrovascular accident -Continue Plavix , statin     Coronary artery disease  - Continue aspirin , Plavix , metoprolol , statin   COPD/chronic respiratory failure On 2 L home O2 at baseline Pressure Ulcer 04/21/13 Stage II -  Partial thickness loss of dermis presenting as a shallow open ulcer with a red, pink wound bed without slough. (Active)  04/21/13   Location: Chest  Location Orientation: Left  Staging: Stage II -  Partial thickness loss of dermis presenting as a shallow open ulcer with a red, pink wound bed without slough.  Wound Description (Comments):   Present on Admission: Yes     Pressure Injury 06/21/23 Coccyx Posterior;Lower Stage 2 -  Partial thickness loss of dermis presenting as a shallow open injury with a red, pink wound bed without slough. (Active)  06/21/23 1501  Location: Coccyx  Location Orientation: Posterior;Lower  Staging: Stage 2 -  Partial thickness loss of dermis presenting as a shallow open injury with a red, pink wound bed without slough.  Wound Description (Comments):   Present on Admission: Yes  Dressing Type Foam - Lift dressing to assess site every shift 06/22/23 0800    Estimated body mass index is 26.64 kg/m as calculated from the following:   Height as of this encounter: 6' (1.829 m).   Weight as of this encounter: 89.1 kg.  Discharge Instructions  Discharge Instructions  Diet - low sodium heart healthy   Complete by: As directed    Discharge wound care:   Complete by: As directed    Cleanse wound sacrum buttocks with soap and water dry and apply gentle's Butt cream 3 times daily and as needed soiling    Increase activity slowly   Complete by: As directed       Allergies as of 06/23/2023   No Known Allergies      Medication List     STOP taking these medications    ALLERGY RELIEF PO       TAKE these medications    amLODipine  5 MG tablet Commonly known as: NORVASC  Take 5 mg by mouth every morning.   amoxicillin -clavulanate 875-125 MG tablet Commonly known as: AUGMENTIN  Take 1 tablet by mouth 2 (two) times daily for 7 days.   aspirin  81 MG tablet Take 81 mg by mouth in the morning.   atorvastatin  10 MG tablet Commonly known as: LIPITOR Take 10 mg by mouth at bedtime.   clopidogrel  75 MG tablet Commonly known as: PLAVIX  Take 75 mg by mouth in the morning.   DULoxetine  60 MG capsule Commonly known as: CYMBALTA  Take 60 mg by mouth 2 (two) times daily.   finasteride  5 MG tablet Commonly known as: PROSCAR  Take 5 mg by mouth in the morning.   Gerhardt's butt cream Crea Apply 1 Application topically 3 (three) times daily.   HumaLOG KwikPen 100 UNIT/ML KwikPen Generic drug: insulin  lispro Inject 0-15 Units into the skin 3 (three) times daily as needed (BS > 150).   HumuLIN  N KwikPen 100 UNIT/ML KwikPen Generic drug: Insulin  NPH (Human) (Isophane) Inject 10-20 Units into the skin See admin instructions. Inject 20 units in the morning and 10 units in the evening depending on blood sugar.   metoprolol  tartrate 25 MG tablet Commonly known as: LOPRESSOR  Take 25 mg by mouth 2 (two) times daily.   Nucynta  ER 200 MG Tb12 Generic drug: Tapentadol  HCl Take 1 tablet (200 mg total) by mouth every 12 (twelve) hours.   oxyCODONE  15 MG immediate release tablet Commonly known as: ROXICODONE  Take 1 tablet (15 mg total) by mouth every 6 (six) hours as needed for pain What changed: when to take this   polyethylene glycol powder 17 GM/SCOOP powder Commonly known as: GLYCOLAX /MIRALAX  Take 17 g by mouth 2 (two) times daily.   senna-docusate 8.6-50 MG tablet Commonly  known as: Senokot-S Take 2 tablets by mouth at bedtime.   traZODone  150 MG tablet Commonly known as: DESYREL  Take 150 mg by mouth at bedtime.               Discharge Care Instructions  (From admission, onward)           Start     Ordered   06/23/23 0000  Discharge wound care:       Comments: Cleanse wound sacrum buttocks with soap and water dry and apply gentle's Butt cream 3 times daily and as needed soiling   06/23/23 4098            Follow-up Information     Suan Elm, MD Follow up.   Specialty: Internal Medicine Contact information: [address] [place] [address] [phone]         Dot Gazella, DPM Follow up.   Specialty: Actuary information: [address] [place] [address] [place] [address] [phone]  No Known Allergies  Consultations:podiatry  Procedures/Studies: DG Foot 2 Views Left Result Date: 06/21/2023 CLINICAL DATA:  Status post left 3rd toe amputation. EXAM: LEFT FOOT - 2 VIEW COMPARISON:  06/20/2023. FINDINGS: Interval surgical absence of the left 3rd toe with stable previous post amputation changes involving the left 1st and 2nd toes. Overlying bandage material. No fracture or dislocation. Again noted is flattening of the normal plantar arch, diffuse osteopenia and moderate inferior calcaneal enthesophyte formation. IMPRESSION: 1. Interval surgical absence of the left 3rd toe. 2. Stable previous post amputation changes involving the left 1st and 2nd toes. 3. Pes planus. 4. Diffuse osteopenia. Electronically Signed   By: Catherin Closs M.D.   On: 06/21/2023 14:39   VAS US  ABI WITH/WO TBI Result Date: 06/21/2023  LOWER EXTREMITY DOPPLER STUDY Patient Name:  [name]  Date of Exam:   06/21/2023 Medical Rec #: [id number]      Accession #:    [id number] Date of Birth: 08-23-1945      Patient Gender: M Patient Age:   78 years Exam Location:  [facility] Procedure:      VAS US  ABI  WITH/WO TBI Referring Phys: --------------------------------------------------------------------------------  Indications: Osteomyelitis of 3rd left toe - scheculed for amputation today.              Cellulitis. Right great toe 03/01/23, left 2nd toe 02/28/22  Limitations: Today's exam was limited due to bandages and left lower ankle and              foot. Performing Technologist: Franky Ivanoff Sturdivant-Jones RDMS, RVT  Examination Guidelines: A complete evaluation includes at minimum, Doppler waveform signals and systolic blood pressure reading at the level of bilateral brachial, anterior tibial, and posterior tibial arteries, when vessel segments are accessible. Bilateral testing is considered an integral part of a complete examination. Photoelectric Plethysmograph (PPG) waveforms and toe systolic pressure readings are included as required and additional duplex testing as needed. Limited examinations for reoccurring indications may be performed as noted.  ABI Findings: +---------+------------------+-----+---------+------------------+ Right    Rt Pressure (mmHg)IndexWaveform Comment            +---------+------------------+-----+---------+------------------+ Brachial                        triphasicIV upper right arm +---------+------------------+-----+---------+------------------+ PTA      254               1.56 triphasic                   +---------+------------------+-----+---------+------------------+ DP       254               1.56 triphasic                   +---------+------------------+-----+---------+------------------+ Great Toe                                amputated          +---------+------------------+-----+---------+------------------+ +---------+------------------+-----+---------+-----------------+ Left     Lt Pressure (mmHg)IndexWaveform Comment           +---------+------------------+-----+---------+-----------------+ Brachial 163                    triphasic                   +---------+------------------+-----+---------+-----------------+ PTA      164  1.01 biphasic                   +---------+------------------+-----+---------+-----------------+ DP       151               0.93 biphasic                   +---------+------------------+-----+---------+-----------------+ Great Toe                                bandages in place +---------+------------------+-----+---------+-----------------+ +-------+-----------+-----------+------------+------------+ ABI/TBIToday's ABIToday's TBIPrevious ABIPrevious TBI +-------+-----------+-----------+------------+------------+ Right  [place]                    [place]          0.76         +-------+-----------+-----------+------------+------------+ Left   1.01                            0.77         +-------+-----------+-----------+------------+------------+ Arterial wall calcification precludes accurate ankle pressures and ABIs.  Summary: Right: Resting right ankle-brachial index indicates noncompressible right lower extremity arteries. Left: Resting left ankle-brachial index is within normal range. *See table(s) above for measurements and observations.  Electronically signed by Jimmye Moulds MD on 06/21/2023 at 2:17:24 PM.    Final    DG Foot Complete Left Result Date: 06/20/2023 CLINICAL DATA:  Left foot infection. Clinical concern for osteomyelitis. EXAM: LEFT FOOT - COMPLETE 3+ VIEW COMPARISON:  03/01/2023 FINDINGS: Extensive bandage material on the left foot and ankle. Post amputation changes of the 1st and 2nd digits are again demonstrated with well-defined bone margins. No bone destruction or periosteal reaction. No soft tissue gas. Diffuse osteopenia. Flattening of the normal plantar arch. Moderate inferior calcaneal spur formation. IMPRESSION: 1. No radiographic evidence of osteomyelitis. 2. Post amputation changes of the 1st and 2nd digits. 3. Diffuse osteopenia. 4. Pes planus.  Electronically Signed   By: Catherin Closs M.D.   On: 06/20/2023 18:32   (Echo, Carotid, EGD, Colonoscopy, ERCP)    Subjective:  Anxious to go home Discharge Exam: Vitals:   06/23/23 0752 06/23/23 1127  BP: 120/64 (!) 130/58  Pulse: 63   Resp:  16  Temp: 97.8 F (36.6 C) 98 F (36.7 C)  SpO2: 91% 92%   Vitals:   06/22/23 2053 06/23/23 0534 06/23/23 0752 06/23/23 1127  BP: (!) 141/70 (!) 150/66 120/64 (!) 130/58  Pulse: (!) 59 60 63   Resp: 18 17  16   Temp: 98 F (36.7 C) 97.7 F (36.5 C) 97.8 F (36.6 C) 98 F (36.7 C)  TempSrc: Oral  Oral Oral  SpO2: 100% 91% 91% 92%  Weight:      Height:        General: Pt is alert, awake, not in acute distress Cardiovascular: RRR, S1/S2 +, no rubs, no gallops Respiratory: CTA bilaterally, no wheezing, no rhonchi Abdominal: Soft, NT, ND, bowel sounds + Extremities: left foot covered with dressing   The results of significant diagnostics from this hospitalization (including imaging, microbiology, ancillary and laboratory) are listed below for reference.     Microbiology: Recent Results (from the past 240 hours)  Aerobic/Anaerobic Culture w Gram Stain (surgical/deep wound)     Status: None (Preliminary result)   Collection Time: 06/21/23  1:46 PM   Specimen: Foot, Left; Wound  Result Value Ref Range Status  Specimen Description TISSUE LEFT FOOT  Final   Special Requests PT ON MAXIPIME   Final   Gram Stain   Final    NO WBC SEEN NO ORGANISMS SEEN Performed at [facility], [address], [place], [place] [address]    Culture   Final    MODERATE STAPHYLOCOCCUS AUREUS SUSCEPTIBILITIES TO FOLLOW NO ANAEROBES ISOLATED; CULTURE IN PROGRESS FOR 5 DAYS    Report Status PENDING  Incomplete     Labs: BNP (last 3 results) No results for input(s): "BNP" in the last 8760 hours. Basic Metabolic Panel: Recent Labs  Lab 06/20/23 2045 06/20/23 2046 06/21/23 0313 06/22/23 0500 06/23/23 0035  NA 136  --  135 134* 133*   K 4.8  --  4.2 4.5 5.2*  CL 99  --  102 102 99  CO2 31  --  30 29 27   GLUCOSE 162*  --  135* 191* 293*  BUN 23  --  21 21 22   CREATININE 1.35* 1.36* 1.26* 1.27* 1.49*  CALCIUM  8.5*  --  8.0* 7.9* 7.6*   Liver Function Tests: Recent Labs  Lab 06/20/23 2045 06/21/23 0313  AST 21 20  ALT 16 14  ALKPHOS 281* 246*  BILITOT 0.9 0.7  PROT 7.3 6.3*  ALBUMIN  2.3* 2.0*   No results for input(s): "LIPASE", "AMYLASE" in the last 168 hours. No results for input(s): "AMMONIA" in the last 168 hours. CBC: Recent Labs  Lab 06/20/23 2045 06/21/23 0313 06/22/23 0500 06/23/23 0035  WBC 8.2 6.8 6.7 6.6  HGB 13.7 12.9* 12.0* 11.7*  HCT 41.7 40.0 37.0* 35.4*  MCV 98.8 99.8 100.5* 100.0  PLT 124* 116* 106* 104*   Cardiac Enzymes: No results for input(s): "CKTOTAL", "CKMB", "CKMBINDEX", "TROPONINI" in the last 168 hours. BNP: Invalid input(s): "POCBNP" CBG: Recent Labs  Lab 06/22/23 1725 06/22/23 1740 06/22/23 2013 06/23/23 0635 06/23/23 1127  GLUCAP 54* 76 152* 340* 231*   D-Dimer No results for input(s): "DDIMER" in the last 72 hours. Hgb A1c Recent Labs    06/20/23 2045  HGBA1C 6.4*   Lipid Profile No results for input(s): "CHOL", "HDL", "LDLCALC", "TRIG", "CHOLHDL", "LDLDIRECT" in the last 72 hours. Thyroid  function studies No results for input(s): "TSH", "T4TOTAL", "T3FREE", "THYROIDAB" in the last 72 hours.  Invalid input(s): "FREET3" Anemia work up No results for input(s): "VITAMINB12", "FOLATE", "FERRITIN", "TIBC", "IRON", "RETICCTPCT" in the last 72 hours. Urinalysis    Component Value Date/Time   COLORURINE YELLOW 02/08/2023 0154   APPEARANCEUR CLEAR 02/08/2023 0154   LABSPEC 1.018 02/08/2023 0154   PHURINE 5.0 02/08/2023 0154   GLUCOSEU >=500 (A) 02/08/2023 0154   HGBUR NEGATIVE 02/08/2023 0154   BILIRUBINUR NEGATIVE 02/08/2023 0154   KETONESUR 20 (A) 02/08/2023 0154   PROTEINUR 30 (A) 02/08/2023 0154   NITRITE NEGATIVE 02/08/2023 0154   LEUKOCYTESUR  NEGATIVE 02/08/2023 0154   Sepsis Labs Recent Labs  Lab 06/20/23 2045 06/21/23 0313 06/22/23 0500 06/23/23 0035  WBC 8.2 6.8 6.7 6.6   Microbiology Recent Results (from the past 240 hours)  Aerobic/Anaerobic Culture w Gram Stain (surgical/deep wound)     Status: None (Preliminary result)   Collection Time: 06/21/23  1:46 PM   Specimen: Foot, Left; Wound  Result Value Ref Range Status   Specimen Description TISSUE LEFT FOOT  Final   Special Requests PT ON MAXIPIME   Final   Gram Stain   Final    NO WBC SEEN NO ORGANISMS SEEN Performed at [facility], [address] [place], [place] [address]    Culture   Final    MODERATE STAPHYLOCOCCUS AUREUS SUSCEPTIBILITIES TO FOLLOW NO ANAEROBES ISOLATED; CULTURE IN PROGRESS FOR 5 DAYS    Report Status PENDING  Incomplete     Time coordinating discharge: 39 min  SIGNED:  Barbee Lew, MD  [facility] 06/23/2023, 4:29 PM

## 2023-06-26 LAB — AEROBIC/ANAEROBIC CULTURE W GRAM STAIN (SURGICAL/DEEP WOUND): Gram Stain: NONE SEEN

## 2023-06-29 ENCOUNTER — Ambulatory Visit (INDEPENDENT_AMBULATORY_CARE_PROVIDER_SITE_OTHER): Admitting: Podiatry

## 2023-06-29 ENCOUNTER — Other Ambulatory Visit (HOSPITAL_BASED_OUTPATIENT_CLINIC_OR_DEPARTMENT_OTHER): Payer: Self-pay

## 2023-06-29 DIAGNOSIS — M79672 Pain in left foot: Secondary | ICD-10-CM | POA: Diagnosis not present

## 2023-06-29 DIAGNOSIS — M86172 Other acute osteomyelitis, left ankle and foot: Secondary | ICD-10-CM

## 2023-06-29 DIAGNOSIS — Z79891 Long term (current) use of opiate analgesic: Secondary | ICD-10-CM | POA: Diagnosis not present

## 2023-06-29 DIAGNOSIS — M79671 Pain in right foot: Secondary | ICD-10-CM | POA: Diagnosis not present

## 2023-06-29 DIAGNOSIS — Z89411 Acquired absence of right great toe: Secondary | ICD-10-CM

## 2023-06-29 DIAGNOSIS — Z89412 Acquired absence of left great toe: Secondary | ICD-10-CM | POA: Diagnosis not present

## 2023-06-29 DIAGNOSIS — E1042 Type 1 diabetes mellitus with diabetic polyneuropathy: Secondary | ICD-10-CM | POA: Diagnosis not present

## 2023-06-29 MED ORDER — OXYCODONE HCL 15 MG PO TABS
15.0000 mg | ORAL_TABLET | Freq: Four times a day (QID) | ORAL | 0 refills | Status: DC | PRN
  Filled 2023-07-04: qty 120, 30d supply, fill #0

## 2023-06-29 MED ORDER — NUCYNTA ER 200 MG PO TB12
200.0000 mg | ORAL_TABLET | Freq: Two times a day (BID) | ORAL | 0 refills | Status: DC
  Filled 2023-08-03 (×2): qty 60, 30d supply, fill #0

## 2023-06-29 MED ORDER — NUCYNTA ER 200 MG PO TB12
200.0000 mg | ORAL_TABLET | Freq: Two times a day (BID) | ORAL | 0 refills | Status: DC
  Filled 2023-07-04: qty 60, 30d supply, fill #0

## 2023-06-29 MED ORDER — OXYCODONE HCL 15 MG PO TABS
15.0000 mg | ORAL_TABLET | Freq: Four times a day (QID) | ORAL | 0 refills | Status: DC | PRN
  Filled 2023-08-03: qty 120, 30d supply, fill #0

## 2023-06-29 NOTE — Progress Notes (Signed)
  Subjective:  Patient ID: [name] Lesches., male    DOB: September 04, 1945,  MRN: [id number]  DOS: 06/21/23 Procedure: Left 3rd toe amputation at MPJ level  78 y.o. male seen for post op check.  He is 1 week postoperative following up first time in office.  No issues or concerns he is doing well.  Has postop shoe and dressings remain clean dry and intact since leaving the hospital  Review of Systems: Negative except as noted in the HPI. Denies N/V/F/Ch.   Objective:   Constitutional Well developed. Well nourished.  Vascular Foot warm and well perfused. Capillary refill normal to all digits.   No calf pain with palpation  Neurologic Normal speech. Oriented to person, place, and time. Epicritic sensation diminished to left foot  Dermatologic Amputation site intact with no evidence of dehiscence or drainage.  Mild venous oozing that is now clotted with some eschar at the amputation site.  Venous congestion of the foot with mild purple discoloration  Orthopedic: S/p L 3rd toe amp, prior L 1st-2nd toe and r hallux amps.   Radiographs: Disarticulation of 3rd toe at MPJ level left foot  Pathology:  1. Interval surgical absence of the left 3rd toe. 2. Stable previous post amputation changes involving the left 1st and 2nd toes. 3. Pes planus. 4. Diffuse osteopenia.  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  1 week s/p L 3rd toe amputation at MPJ level -Progressing as expected post op amp site viable without evidence of dehiscence or residual infection -XR: expected post op changes -WB Status: WBAT in post op shoe - should not wear while in bed -Sutures: remain intact 1-2 more weeks -Medications/ABX: Finish course of p.o. antibiotics -Dressing: New dressing applied leave intact until next appointment - F/u Plan: Pt to f/u next week Thursday         [name], DPM [facility] / [facility]

## 2023-07-04 ENCOUNTER — Other Ambulatory Visit (HOSPITAL_BASED_OUTPATIENT_CLINIC_OR_DEPARTMENT_OTHER): Payer: Self-pay

## 2023-07-04 DIAGNOSIS — I129 Hypertensive chronic kidney disease with stage 1 through stage 4 chronic kidney disease, or unspecified chronic kidney disease: Secondary | ICD-10-CM | POA: Diagnosis not present

## 2023-07-04 DIAGNOSIS — G894 Chronic pain syndrome: Secondary | ICD-10-CM | POA: Diagnosis not present

## 2023-07-04 DIAGNOSIS — E871 Hypo-osmolality and hyponatremia: Secondary | ICD-10-CM | POA: Diagnosis not present

## 2023-07-04 DIAGNOSIS — E1059 Type 1 diabetes mellitus with other circulatory complications: Secondary | ICD-10-CM | POA: Diagnosis not present

## 2023-07-04 DIAGNOSIS — E1029 Type 1 diabetes mellitus with other diabetic kidney complication: Secondary | ICD-10-CM | POA: Diagnosis not present

## 2023-07-04 DIAGNOSIS — L03116 Cellulitis of left lower limb: Secondary | ICD-10-CM | POA: Diagnosis not present

## 2023-07-04 DIAGNOSIS — M86179 Other acute osteomyelitis, unspecified ankle and foot: Secondary | ICD-10-CM | POA: Diagnosis not present

## 2023-07-04 DIAGNOSIS — I2510 Atherosclerotic heart disease of native coronary artery without angina pectoris: Secondary | ICD-10-CM | POA: Diagnosis not present

## 2023-07-04 DIAGNOSIS — N1831 Chronic kidney disease, stage 3a: Secondary | ICD-10-CM | POA: Diagnosis not present

## 2023-07-04 DIAGNOSIS — E1042 Type 1 diabetes mellitus with diabetic polyneuropathy: Secondary | ICD-10-CM | POA: Diagnosis not present

## 2023-07-04 DIAGNOSIS — Z794 Long term (current) use of insulin: Secondary | ICD-10-CM | POA: Diagnosis not present

## 2023-07-04 DIAGNOSIS — L89322 Pressure ulcer of left buttock, stage 2: Secondary | ICD-10-CM | POA: Diagnosis not present

## 2023-07-06 ENCOUNTER — Encounter: Admitting: Podiatry

## 2023-07-10 ENCOUNTER — Other Ambulatory Visit

## 2023-07-11 ENCOUNTER — Ambulatory Visit (INDEPENDENT_AMBULATORY_CARE_PROVIDER_SITE_OTHER): Admitting: Podiatry

## 2023-07-11 DIAGNOSIS — M86172 Other acute osteomyelitis, left ankle and foot: Secondary | ICD-10-CM

## 2023-07-11 DIAGNOSIS — Z9889 Other specified postprocedural states: Secondary | ICD-10-CM

## 2023-07-11 DIAGNOSIS — Z89422 Acquired absence of other left toe(s): Secondary | ICD-10-CM

## 2023-07-11 DIAGNOSIS — Z89412 Acquired absence of left great toe: Secondary | ICD-10-CM

## 2023-07-11 NOTE — Progress Notes (Signed)
  Subjective:  Patient ID: [name] Lesches., male    DOB: 1945-05-15,  MRN: [id number]  DOS: 06/21/23 Procedure: Left 3rd toe amputation at MPJ level  78 y.o. male seen for post op check.  He is 2 week postoperative. His wife is concerned about a spot on the right fourth toe.  Denies any drainage from the left second toe amputation site.  He is wearing diabetic shoes.   Review of Systems: Negative except as noted in the HPI. Denies N/V/F/Ch.   Objective:   Constitutional Well developed. Well nourished.  Vascular Foot warm and well perfused. Capillary refill normal to all digits.   No calf pain with palpation  Neurologic Normal speech. Oriented to person, place, and time. Epicritic sensation diminished to left foot  Dermatologic Amputation site at the left second toe intact and healing well with eschar overlying.  No dehiscence drainage or evidence of infection.  On the right fourth toe there is a small eschar at the distal tuft of the toe with mild superficial ulceration underlying.  Orthopedic: S/p L 3rd toe amp, prior L 1st-2nd toe and r hallux amps.   Radiographs: Disarticulation of 3rd toe at MPJ level left foot  Pathology:  1. Interval surgical absence of the left 3rd toe. 2. Stable previous post amputation changes involving the left 1st and 2nd toes. 3. Pes planus. 4. Diffuse osteopenia.  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  2 week s/p L 3rd toe amputation at MPJ level -Progressing as expected post op amp site viable without evidence of dehiscence or residual infection -Removed sutures and debrided the left second toe amputation site as well as the right fourth toe superficial ulceration to healthy bleeding wound bed bilaterally. -Recommend daily Betadine and Band-Aid dressing changes to left second toe amputation site and right  fourth toe until healed -XR: expected post op changes -WB Status: WBAT in post op shoe  -Sutures: Removed in total from left second toe amputation site today -Medications/ABX: No further antibiotics indicated -Dressing: Betadine and adhesive bandage as above - F/u Plan: Follow-up in 2 weeks        [name], DPM [facility] / [facility]

## 2023-07-16 DIAGNOSIS — Z89422 Acquired absence of other left toe(s): Secondary | ICD-10-CM | POA: Diagnosis not present

## 2023-07-16 DIAGNOSIS — G894 Chronic pain syndrome: Secondary | ICD-10-CM | POA: Diagnosis not present

## 2023-07-16 DIAGNOSIS — Z7982 Long term (current) use of aspirin: Secondary | ICD-10-CM | POA: Diagnosis not present

## 2023-07-16 DIAGNOSIS — I2510 Atherosclerotic heart disease of native coronary artery without angina pectoris: Secondary | ICD-10-CM | POA: Diagnosis not present

## 2023-07-16 DIAGNOSIS — Z7902 Long term (current) use of antithrombotics/antiplatelets: Secondary | ICD-10-CM | POA: Diagnosis not present

## 2023-07-16 DIAGNOSIS — E1042 Type 1 diabetes mellitus with diabetic polyneuropathy: Secondary | ICD-10-CM | POA: Diagnosis not present

## 2023-07-16 DIAGNOSIS — I69354 Hemiplegia and hemiparesis following cerebral infarction affecting left non-dominant side: Secondary | ICD-10-CM | POA: Diagnosis not present

## 2023-07-16 DIAGNOSIS — J449 Chronic obstructive pulmonary disease, unspecified: Secondary | ICD-10-CM | POA: Diagnosis not present

## 2023-07-16 DIAGNOSIS — Z89412 Acquired absence of left great toe: Secondary | ICD-10-CM | POA: Diagnosis not present

## 2023-07-16 DIAGNOSIS — N1831 Chronic kidney disease, stage 3a: Secondary | ICD-10-CM | POA: Diagnosis not present

## 2023-07-16 DIAGNOSIS — L89322 Pressure ulcer of left buttock, stage 2: Secondary | ICD-10-CM | POA: Diagnosis not present

## 2023-07-16 DIAGNOSIS — H3530 Unspecified macular degeneration: Secondary | ICD-10-CM | POA: Diagnosis not present

## 2023-07-16 DIAGNOSIS — E1022 Type 1 diabetes mellitus with diabetic chronic kidney disease: Secondary | ICD-10-CM | POA: Diagnosis not present

## 2023-07-16 DIAGNOSIS — E785 Hyperlipidemia, unspecified: Secondary | ICD-10-CM | POA: Diagnosis not present

## 2023-07-16 DIAGNOSIS — Z556 Problems related to health literacy: Secondary | ICD-10-CM | POA: Diagnosis not present

## 2023-07-16 DIAGNOSIS — E1059 Type 1 diabetes mellitus with other circulatory complications: Secondary | ICD-10-CM | POA: Diagnosis not present

## 2023-07-16 DIAGNOSIS — E538 Deficiency of other specified B group vitamins: Secondary | ICD-10-CM | POA: Diagnosis not present

## 2023-07-16 DIAGNOSIS — Z9981 Dependence on supplemental oxygen: Secondary | ICD-10-CM | POA: Diagnosis not present

## 2023-07-16 DIAGNOSIS — E871 Hypo-osmolality and hyponatremia: Secondary | ICD-10-CM | POA: Diagnosis not present

## 2023-07-16 DIAGNOSIS — I252 Old myocardial infarction: Secondary | ICD-10-CM | POA: Diagnosis not present

## 2023-07-16 DIAGNOSIS — G35 Multiple sclerosis: Secondary | ICD-10-CM | POA: Diagnosis not present

## 2023-07-16 DIAGNOSIS — Z4781 Encounter for orthopedic aftercare following surgical amputation: Secondary | ICD-10-CM | POA: Diagnosis not present

## 2023-07-16 DIAGNOSIS — F329 Major depressive disorder, single episode, unspecified: Secondary | ICD-10-CM | POA: Diagnosis not present

## 2023-07-16 DIAGNOSIS — I129 Hypertensive chronic kidney disease with stage 1 through stage 4 chronic kidney disease, or unspecified chronic kidney disease: Secondary | ICD-10-CM | POA: Diagnosis not present

## 2023-07-16 DIAGNOSIS — E859 Amyloidosis, unspecified: Secondary | ICD-10-CM | POA: Diagnosis not present

## 2023-07-19 DIAGNOSIS — N1831 Chronic kidney disease, stage 3a: Secondary | ICD-10-CM | POA: Diagnosis not present

## 2023-07-19 DIAGNOSIS — L89322 Pressure ulcer of left buttock, stage 2: Secondary | ICD-10-CM | POA: Diagnosis not present

## 2023-07-19 DIAGNOSIS — E1022 Type 1 diabetes mellitus with diabetic chronic kidney disease: Secondary | ICD-10-CM | POA: Diagnosis not present

## 2023-07-19 DIAGNOSIS — Z4781 Encounter for orthopedic aftercare following surgical amputation: Secondary | ICD-10-CM | POA: Diagnosis not present

## 2023-07-19 DIAGNOSIS — E1059 Type 1 diabetes mellitus with other circulatory complications: Secondary | ICD-10-CM | POA: Diagnosis not present

## 2023-07-19 DIAGNOSIS — I129 Hypertensive chronic kidney disease with stage 1 through stage 4 chronic kidney disease, or unspecified chronic kidney disease: Secondary | ICD-10-CM | POA: Diagnosis not present

## 2023-07-24 DIAGNOSIS — Z4781 Encounter for orthopedic aftercare following surgical amputation: Secondary | ICD-10-CM | POA: Diagnosis not present

## 2023-07-24 DIAGNOSIS — E1059 Type 1 diabetes mellitus with other circulatory complications: Secondary | ICD-10-CM | POA: Diagnosis not present

## 2023-07-24 DIAGNOSIS — N1831 Chronic kidney disease, stage 3a: Secondary | ICD-10-CM | POA: Diagnosis not present

## 2023-07-24 DIAGNOSIS — E1022 Type 1 diabetes mellitus with diabetic chronic kidney disease: Secondary | ICD-10-CM | POA: Diagnosis not present

## 2023-07-24 DIAGNOSIS — L89322 Pressure ulcer of left buttock, stage 2: Secondary | ICD-10-CM | POA: Diagnosis not present

## 2023-07-24 DIAGNOSIS — I129 Hypertensive chronic kidney disease with stage 1 through stage 4 chronic kidney disease, or unspecified chronic kidney disease: Secondary | ICD-10-CM | POA: Diagnosis not present

## 2023-07-25 ENCOUNTER — Ambulatory Visit (INDEPENDENT_AMBULATORY_CARE_PROVIDER_SITE_OTHER): Admitting: Podiatry

## 2023-07-25 DIAGNOSIS — Z89422 Acquired absence of other left toe(s): Secondary | ICD-10-CM

## 2023-07-25 DIAGNOSIS — Z9889 Other specified postprocedural states: Secondary | ICD-10-CM | POA: Diagnosis not present

## 2023-07-25 DIAGNOSIS — M86172 Other acute osteomyelitis, left ankle and foot: Secondary | ICD-10-CM | POA: Diagnosis not present

## 2023-07-25 NOTE — Progress Notes (Signed)
  Subjective:  Patient ID: [name] Lesches., male    DOB: Jun 27, 1945,  MRN: [id number]  DOS: 06/21/23 Procedure: Left 3rd toe amputation at MPJ level  78 y.o. male seen for post op check.  He is 4 week postoperative.  Wearing diabetic shoes.  He is doing well denies any drainage from the amputation site.  There is a scab overlying the area that was amputated.  He denies pain.  No concerns  Review of Systems: Negative except as noted in the HPI. Denies N/V/F/Ch.   Objective:   Constitutional Well developed. Well nourished.  Vascular Foot warm and well perfused. Capillary refill normal to all digits.   No calf pain with palpation  Neurologic Normal speech. Oriented to person, place, and time. Epicritic sensation diminished to left foot  Dermatologic Amputation site at the left second toe intact and healing well with eschar overlying, improved from prior nearly fully healed.  No dehiscence drainage or evidence of infection.  Prior wound on the right fourth toe now appears healed   Orthopedic: S/p L 3rd toe amp, prior L 1st-2nd toe and r hallux amps.   Radiographs: Disarticulation of 3rd toe at MPJ level left foot  Pathology:  1. Interval surgical absence of the left 3rd toe. 2. Stable previous post amputation changes involving the left 1st and 2nd toes. 3. Pes planus. 4. Diffuse osteopenia.  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  4 week s/p L 3rd toe amputation at MPJ level -Progressing as expected post op amp site viable without evidence of dehiscence or residual infection - No further wound care is needed for the left second toe amputation site can continue to do a Band-Aid if they want and monitor for any drainage or opening wound at that area.  Leave the scab intact will fall off in time on its own. -XR: expected post op changes -WB  Status: WBAT in post op shoe  -Medications/ABX: No further antibiotics indicated -Dressing: Betadine and adhesive bandage as above - F/u Plan: Follow-up in mid July with Dr. Zettie Hillock for ongoing high risk footcare, they are aware to call and come in earlier if any problems arise in regards to new wounds or issues at the amputation sites        [name], DPM [facility] / [facility]

## 2023-07-27 ENCOUNTER — Other Ambulatory Visit (HOSPITAL_BASED_OUTPATIENT_CLINIC_OR_DEPARTMENT_OTHER): Payer: Self-pay

## 2023-07-31 ENCOUNTER — Other Ambulatory Visit (HOSPITAL_BASED_OUTPATIENT_CLINIC_OR_DEPARTMENT_OTHER): Payer: Self-pay

## 2023-07-31 DIAGNOSIS — Z4781 Encounter for orthopedic aftercare following surgical amputation: Secondary | ICD-10-CM | POA: Diagnosis not present

## 2023-07-31 DIAGNOSIS — L89322 Pressure ulcer of left buttock, stage 2: Secondary | ICD-10-CM | POA: Diagnosis not present

## 2023-07-31 DIAGNOSIS — N1831 Chronic kidney disease, stage 3a: Secondary | ICD-10-CM | POA: Diagnosis not present

## 2023-07-31 DIAGNOSIS — E1022 Type 1 diabetes mellitus with diabetic chronic kidney disease: Secondary | ICD-10-CM | POA: Diagnosis not present

## 2023-07-31 DIAGNOSIS — E1059 Type 1 diabetes mellitus with other circulatory complications: Secondary | ICD-10-CM | POA: Diagnosis not present

## 2023-07-31 DIAGNOSIS — I129 Hypertensive chronic kidney disease with stage 1 through stage 4 chronic kidney disease, or unspecified chronic kidney disease: Secondary | ICD-10-CM | POA: Diagnosis not present

## 2023-08-03 ENCOUNTER — Encounter (HOSPITAL_BASED_OUTPATIENT_CLINIC_OR_DEPARTMENT_OTHER): Payer: Self-pay | Admitting: Pharmacist

## 2023-08-03 ENCOUNTER — Other Ambulatory Visit (HOSPITAL_COMMUNITY): Payer: Self-pay

## 2023-08-03 ENCOUNTER — Other Ambulatory Visit: Payer: Self-pay

## 2023-08-03 ENCOUNTER — Other Ambulatory Visit (HOSPITAL_BASED_OUTPATIENT_CLINIC_OR_DEPARTMENT_OTHER): Payer: Self-pay

## 2023-08-07 DIAGNOSIS — Z4781 Encounter for orthopedic aftercare following surgical amputation: Secondary | ICD-10-CM | POA: Diagnosis not present

## 2023-08-07 DIAGNOSIS — E1059 Type 1 diabetes mellitus with other circulatory complications: Secondary | ICD-10-CM | POA: Diagnosis not present

## 2023-08-07 DIAGNOSIS — L89322 Pressure ulcer of left buttock, stage 2: Secondary | ICD-10-CM | POA: Diagnosis not present

## 2023-08-07 DIAGNOSIS — N1831 Chronic kidney disease, stage 3a: Secondary | ICD-10-CM | POA: Diagnosis not present

## 2023-08-07 DIAGNOSIS — E1022 Type 1 diabetes mellitus with diabetic chronic kidney disease: Secondary | ICD-10-CM | POA: Diagnosis not present

## 2023-08-07 DIAGNOSIS — I129 Hypertensive chronic kidney disease with stage 1 through stage 4 chronic kidney disease, or unspecified chronic kidney disease: Secondary | ICD-10-CM | POA: Diagnosis not present

## 2023-08-14 DIAGNOSIS — Z4781 Encounter for orthopedic aftercare following surgical amputation: Secondary | ICD-10-CM | POA: Diagnosis not present

## 2023-08-14 DIAGNOSIS — E1059 Type 1 diabetes mellitus with other circulatory complications: Secondary | ICD-10-CM | POA: Diagnosis not present

## 2023-08-14 DIAGNOSIS — E1022 Type 1 diabetes mellitus with diabetic chronic kidney disease: Secondary | ICD-10-CM | POA: Diagnosis not present

## 2023-08-14 DIAGNOSIS — N1831 Chronic kidney disease, stage 3a: Secondary | ICD-10-CM | POA: Diagnosis not present

## 2023-08-14 DIAGNOSIS — I129 Hypertensive chronic kidney disease with stage 1 through stage 4 chronic kidney disease, or unspecified chronic kidney disease: Secondary | ICD-10-CM | POA: Diagnosis not present

## 2023-08-14 DIAGNOSIS — L89322 Pressure ulcer of left buttock, stage 2: Secondary | ICD-10-CM | POA: Diagnosis not present

## 2023-08-15 DIAGNOSIS — I129 Hypertensive chronic kidney disease with stage 1 through stage 4 chronic kidney disease, or unspecified chronic kidney disease: Secondary | ICD-10-CM | POA: Diagnosis not present

## 2023-08-15 DIAGNOSIS — Z4781 Encounter for orthopedic aftercare following surgical amputation: Secondary | ICD-10-CM | POA: Diagnosis not present

## 2023-08-15 DIAGNOSIS — Z556 Problems related to health literacy: Secondary | ICD-10-CM | POA: Diagnosis not present

## 2023-08-15 DIAGNOSIS — E1059 Type 1 diabetes mellitus with other circulatory complications: Secondary | ICD-10-CM | POA: Diagnosis not present

## 2023-08-15 DIAGNOSIS — E871 Hypo-osmolality and hyponatremia: Secondary | ICD-10-CM | POA: Diagnosis not present

## 2023-08-15 DIAGNOSIS — E859 Amyloidosis, unspecified: Secondary | ICD-10-CM | POA: Diagnosis not present

## 2023-08-15 DIAGNOSIS — H3530 Unspecified macular degeneration: Secondary | ICD-10-CM | POA: Diagnosis not present

## 2023-08-15 DIAGNOSIS — Z89412 Acquired absence of left great toe: Secondary | ICD-10-CM | POA: Diagnosis not present

## 2023-08-15 DIAGNOSIS — Z7902 Long term (current) use of antithrombotics/antiplatelets: Secondary | ICD-10-CM | POA: Diagnosis not present

## 2023-08-15 DIAGNOSIS — F329 Major depressive disorder, single episode, unspecified: Secondary | ICD-10-CM | POA: Diagnosis not present

## 2023-08-15 DIAGNOSIS — Z7982 Long term (current) use of aspirin: Secondary | ICD-10-CM | POA: Diagnosis not present

## 2023-08-15 DIAGNOSIS — I2510 Atherosclerotic heart disease of native coronary artery without angina pectoris: Secondary | ICD-10-CM | POA: Diagnosis not present

## 2023-08-15 DIAGNOSIS — E538 Deficiency of other specified B group vitamins: Secondary | ICD-10-CM | POA: Diagnosis not present

## 2023-08-15 DIAGNOSIS — G894 Chronic pain syndrome: Secondary | ICD-10-CM | POA: Diagnosis not present

## 2023-08-15 DIAGNOSIS — N1831 Chronic kidney disease, stage 3a: Secondary | ICD-10-CM | POA: Diagnosis not present

## 2023-08-15 DIAGNOSIS — E1022 Type 1 diabetes mellitus with diabetic chronic kidney disease: Secondary | ICD-10-CM | POA: Diagnosis not present

## 2023-08-15 DIAGNOSIS — G35 Multiple sclerosis: Secondary | ICD-10-CM | POA: Diagnosis not present

## 2023-08-15 DIAGNOSIS — Z89422 Acquired absence of other left toe(s): Secondary | ICD-10-CM | POA: Diagnosis not present

## 2023-08-15 DIAGNOSIS — I69354 Hemiplegia and hemiparesis following cerebral infarction affecting left non-dominant side: Secondary | ICD-10-CM | POA: Diagnosis not present

## 2023-08-15 DIAGNOSIS — Z9981 Dependence on supplemental oxygen: Secondary | ICD-10-CM | POA: Diagnosis not present

## 2023-08-15 DIAGNOSIS — J449 Chronic obstructive pulmonary disease, unspecified: Secondary | ICD-10-CM | POA: Diagnosis not present

## 2023-08-15 DIAGNOSIS — L89322 Pressure ulcer of left buttock, stage 2: Secondary | ICD-10-CM | POA: Diagnosis not present

## 2023-08-15 DIAGNOSIS — E785 Hyperlipidemia, unspecified: Secondary | ICD-10-CM | POA: Diagnosis not present

## 2023-08-15 DIAGNOSIS — E1042 Type 1 diabetes mellitus with diabetic polyneuropathy: Secondary | ICD-10-CM | POA: Diagnosis not present

## 2023-08-15 DIAGNOSIS — I252 Old myocardial infarction: Secondary | ICD-10-CM | POA: Diagnosis not present

## 2023-08-17 ENCOUNTER — Ambulatory Visit: Admitting: Podiatry

## 2023-08-21 DIAGNOSIS — R2681 Unsteadiness on feet: Secondary | ICD-10-CM | POA: Diagnosis not present

## 2023-08-21 DIAGNOSIS — E1029 Type 1 diabetes mellitus with other diabetic kidney complication: Secondary | ICD-10-CM | POA: Diagnosis not present

## 2023-08-21 DIAGNOSIS — Z9981 Dependence on supplemental oxygen: Secondary | ICD-10-CM | POA: Diagnosis not present

## 2023-08-21 DIAGNOSIS — Z794 Long term (current) use of insulin: Secondary | ICD-10-CM | POA: Diagnosis not present

## 2023-08-21 DIAGNOSIS — N1831 Chronic kidney disease, stage 3a: Secondary | ICD-10-CM | POA: Diagnosis not present

## 2023-08-21 DIAGNOSIS — I129 Hypertensive chronic kidney disease with stage 1 through stage 4 chronic kidney disease, or unspecified chronic kidney disease: Secondary | ICD-10-CM | POA: Diagnosis not present

## 2023-08-21 DIAGNOSIS — G629 Polyneuropathy, unspecified: Secondary | ICD-10-CM | POA: Diagnosis not present

## 2023-08-21 DIAGNOSIS — G35 Multiple sclerosis: Secondary | ICD-10-CM | POA: Diagnosis not present

## 2023-08-21 DIAGNOSIS — J449 Chronic obstructive pulmonary disease, unspecified: Secondary | ICD-10-CM | POA: Diagnosis not present

## 2023-08-21 DIAGNOSIS — I2510 Atherosclerotic heart disease of native coronary artery without angina pectoris: Secondary | ICD-10-CM | POA: Diagnosis not present

## 2023-08-21 DIAGNOSIS — G894 Chronic pain syndrome: Secondary | ICD-10-CM | POA: Diagnosis not present

## 2023-08-22 ENCOUNTER — Ambulatory Visit (INDEPENDENT_AMBULATORY_CARE_PROVIDER_SITE_OTHER): Admitting: Podiatry

## 2023-08-22 ENCOUNTER — Encounter: Payer: Self-pay | Admitting: Podiatry

## 2023-08-22 DIAGNOSIS — N1831 Chronic kidney disease, stage 3a: Secondary | ICD-10-CM | POA: Diagnosis not present

## 2023-08-22 DIAGNOSIS — Z89422 Acquired absence of other left toe(s): Secondary | ICD-10-CM | POA: Diagnosis not present

## 2023-08-22 DIAGNOSIS — Z89412 Acquired absence of left great toe: Secondary | ICD-10-CM | POA: Diagnosis not present

## 2023-08-22 DIAGNOSIS — L97522 Non-pressure chronic ulcer of other part of left foot with fat layer exposed: Secondary | ICD-10-CM

## 2023-08-22 DIAGNOSIS — E1142 Type 2 diabetes mellitus with diabetic polyneuropathy: Secondary | ICD-10-CM | POA: Diagnosis not present

## 2023-08-22 DIAGNOSIS — B351 Tinea unguium: Secondary | ICD-10-CM

## 2023-08-22 DIAGNOSIS — D696 Thrombocytopenia, unspecified: Secondary | ICD-10-CM | POA: Diagnosis not present

## 2023-08-22 NOTE — Progress Notes (Signed)
 This patient presents to the office for treatment of his nails.  He has history  of CKD, DM, MS and CVA.  He has been a patient of Dr.  Malvin  who has treated him with amputation of digits due to infection.  He presents to the office for treatment of his nails. General Appearance  Alert, conversant and in no acute stress.  Vascular  Dorsalis pedis and posterior tibial  pulses are  weakly palpable  bilaterally.  Capillary return is within normal limits  bilaterally. Cold feet B/L. bilaterally.  Neurologic  Senn-Weinstein monofilament wire test diminished  bilaterally. Muscle power within normal limits bilaterally.  Nails Thick disfigured discolored nails with subungual debris  multiple digits  both feet.No evidence of bacterial infection or drainage bilaterally.  Orthopedic  No limitations of motion  feet .  No crepitus or effusions noted.  No bony pathology or digital deformities noted.Amputation 1,2,3 left foot and hallux right foot.  Skin  normotropic skin with no porokeratosis noted bilaterally.  No signs of infections or ulcers noted. Crust noted  at site of amputation left foot.  The fourth toe has developed a red inflamed area  .  No drainage or infection noted.  Onychomycosis  B/L  Ulcer left forefoot.  Debridement of nails with nail nipper and dremel tool.  Crust was removed and no pus or drainage was noted.  Site was successfully cauterized.  Padding with toe cap applied to fourth toe.  His wife was told to call the office if foot/toe worsens.  RTC 10 weeks for RFC.   Cordella Bold DPM

## 2023-08-28 DIAGNOSIS — L89322 Pressure ulcer of left buttock, stage 2: Secondary | ICD-10-CM | POA: Diagnosis not present

## 2023-08-28 DIAGNOSIS — N1831 Chronic kidney disease, stage 3a: Secondary | ICD-10-CM | POA: Diagnosis not present

## 2023-08-28 DIAGNOSIS — Z4781 Encounter for orthopedic aftercare following surgical amputation: Secondary | ICD-10-CM | POA: Diagnosis not present

## 2023-08-28 DIAGNOSIS — E1022 Type 1 diabetes mellitus with diabetic chronic kidney disease: Secondary | ICD-10-CM | POA: Diagnosis not present

## 2023-08-28 DIAGNOSIS — I129 Hypertensive chronic kidney disease with stage 1 through stage 4 chronic kidney disease, or unspecified chronic kidney disease: Secondary | ICD-10-CM | POA: Diagnosis not present

## 2023-08-28 DIAGNOSIS — E1059 Type 1 diabetes mellitus with other circulatory complications: Secondary | ICD-10-CM | POA: Diagnosis not present

## 2023-08-29 ENCOUNTER — Other Ambulatory Visit (HOSPITAL_BASED_OUTPATIENT_CLINIC_OR_DEPARTMENT_OTHER): Payer: Self-pay

## 2023-08-29 DIAGNOSIS — M79671 Pain in right foot: Secondary | ICD-10-CM | POA: Diagnosis not present

## 2023-08-29 DIAGNOSIS — Z79891 Long term (current) use of opiate analgesic: Secondary | ICD-10-CM | POA: Diagnosis not present

## 2023-08-29 DIAGNOSIS — M79672 Pain in left foot: Secondary | ICD-10-CM | POA: Diagnosis not present

## 2023-08-29 DIAGNOSIS — E1042 Type 1 diabetes mellitus with diabetic polyneuropathy: Secondary | ICD-10-CM | POA: Diagnosis not present

## 2023-08-29 MED ORDER — OXYCODONE HCL 15 MG PO TABS
15.0000 mg | ORAL_TABLET | Freq: Four times a day (QID) | ORAL | 0 refills | Status: AC | PRN
  Filled 2023-10-02: qty 120, 30d supply, fill #0

## 2023-08-29 MED ORDER — OXYCODONE HCL 15 MG PO TABS
15.0000 mg | ORAL_TABLET | Freq: Four times a day (QID) | ORAL | 0 refills | Status: DC | PRN
  Filled 2023-09-04: qty 120, 30d supply, fill #0

## 2023-08-29 MED ORDER — NUCYNTA ER 200 MG PO TB12
200.0000 mg | ORAL_TABLET | Freq: Two times a day (BID) | ORAL | 0 refills | Status: AC
  Filled 2023-10-02: qty 60, 30d supply, fill #0

## 2023-08-29 MED ORDER — NUCYNTA ER 200 MG PO TB12
200.0000 mg | ORAL_TABLET | Freq: Two times a day (BID) | ORAL | 0 refills | Status: DC
  Filled 2023-09-04: qty 60, 30d supply, fill #0

## 2023-08-31 ENCOUNTER — Other Ambulatory Visit (HOSPITAL_BASED_OUTPATIENT_CLINIC_OR_DEPARTMENT_OTHER): Payer: Self-pay

## 2023-09-04 ENCOUNTER — Other Ambulatory Visit (HOSPITAL_BASED_OUTPATIENT_CLINIC_OR_DEPARTMENT_OTHER): Payer: Self-pay

## 2023-09-05 DIAGNOSIS — Z4781 Encounter for orthopedic aftercare following surgical amputation: Secondary | ICD-10-CM | POA: Diagnosis not present

## 2023-09-05 DIAGNOSIS — E1022 Type 1 diabetes mellitus with diabetic chronic kidney disease: Secondary | ICD-10-CM | POA: Diagnosis not present

## 2023-09-05 DIAGNOSIS — N1831 Chronic kidney disease, stage 3a: Secondary | ICD-10-CM | POA: Diagnosis not present

## 2023-09-05 DIAGNOSIS — I129 Hypertensive chronic kidney disease with stage 1 through stage 4 chronic kidney disease, or unspecified chronic kidney disease: Secondary | ICD-10-CM | POA: Diagnosis not present

## 2023-09-05 DIAGNOSIS — E1059 Type 1 diabetes mellitus with other circulatory complications: Secondary | ICD-10-CM | POA: Diagnosis not present

## 2023-09-05 DIAGNOSIS — L89322 Pressure ulcer of left buttock, stage 2: Secondary | ICD-10-CM | POA: Diagnosis not present

## 2023-09-11 DIAGNOSIS — I129 Hypertensive chronic kidney disease with stage 1 through stage 4 chronic kidney disease, or unspecified chronic kidney disease: Secondary | ICD-10-CM | POA: Diagnosis not present

## 2023-09-11 DIAGNOSIS — L89322 Pressure ulcer of left buttock, stage 2: Secondary | ICD-10-CM | POA: Diagnosis not present

## 2023-09-11 DIAGNOSIS — E1022 Type 1 diabetes mellitus with diabetic chronic kidney disease: Secondary | ICD-10-CM | POA: Diagnosis not present

## 2023-09-11 DIAGNOSIS — N1831 Chronic kidney disease, stage 3a: Secondary | ICD-10-CM | POA: Diagnosis not present

## 2023-09-11 DIAGNOSIS — E1059 Type 1 diabetes mellitus with other circulatory complications: Secondary | ICD-10-CM | POA: Diagnosis not present

## 2023-09-11 DIAGNOSIS — Z4781 Encounter for orthopedic aftercare following surgical amputation: Secondary | ICD-10-CM | POA: Diagnosis not present

## 2023-09-14 DIAGNOSIS — E1059 Type 1 diabetes mellitus with other circulatory complications: Secondary | ICD-10-CM | POA: Diagnosis not present

## 2023-09-14 DIAGNOSIS — F329 Major depressive disorder, single episode, unspecified: Secondary | ICD-10-CM | POA: Diagnosis not present

## 2023-09-14 DIAGNOSIS — E859 Amyloidosis, unspecified: Secondary | ICD-10-CM | POA: Diagnosis not present

## 2023-09-14 DIAGNOSIS — Z7902 Long term (current) use of antithrombotics/antiplatelets: Secondary | ICD-10-CM | POA: Diagnosis not present

## 2023-09-14 DIAGNOSIS — E1022 Type 1 diabetes mellitus with diabetic chronic kidney disease: Secondary | ICD-10-CM | POA: Diagnosis not present

## 2023-09-14 DIAGNOSIS — J449 Chronic obstructive pulmonary disease, unspecified: Secondary | ICD-10-CM | POA: Diagnosis not present

## 2023-09-14 DIAGNOSIS — G894 Chronic pain syndrome: Secondary | ICD-10-CM | POA: Diagnosis not present

## 2023-09-14 DIAGNOSIS — Z89422 Acquired absence of other left toe(s): Secondary | ICD-10-CM | POA: Diagnosis not present

## 2023-09-14 DIAGNOSIS — Z9981 Dependence on supplemental oxygen: Secondary | ICD-10-CM | POA: Diagnosis not present

## 2023-09-14 DIAGNOSIS — I252 Old myocardial infarction: Secondary | ICD-10-CM | POA: Diagnosis not present

## 2023-09-14 DIAGNOSIS — Z556 Problems related to health literacy: Secondary | ICD-10-CM | POA: Diagnosis not present

## 2023-09-14 DIAGNOSIS — G35 Multiple sclerosis: Secondary | ICD-10-CM | POA: Diagnosis not present

## 2023-09-14 DIAGNOSIS — E785 Hyperlipidemia, unspecified: Secondary | ICD-10-CM | POA: Diagnosis not present

## 2023-09-14 DIAGNOSIS — Z4781 Encounter for orthopedic aftercare following surgical amputation: Secondary | ICD-10-CM | POA: Diagnosis not present

## 2023-09-14 DIAGNOSIS — Z7982 Long term (current) use of aspirin: Secondary | ICD-10-CM | POA: Diagnosis not present

## 2023-09-14 DIAGNOSIS — I129 Hypertensive chronic kidney disease with stage 1 through stage 4 chronic kidney disease, or unspecified chronic kidney disease: Secondary | ICD-10-CM | POA: Diagnosis not present

## 2023-09-14 DIAGNOSIS — N1831 Chronic kidney disease, stage 3a: Secondary | ICD-10-CM | POA: Diagnosis not present

## 2023-09-14 DIAGNOSIS — I69354 Hemiplegia and hemiparesis following cerebral infarction affecting left non-dominant side: Secondary | ICD-10-CM | POA: Diagnosis not present

## 2023-09-14 DIAGNOSIS — L89322 Pressure ulcer of left buttock, stage 2: Secondary | ICD-10-CM | POA: Diagnosis not present

## 2023-09-14 DIAGNOSIS — E1042 Type 1 diabetes mellitus with diabetic polyneuropathy: Secondary | ICD-10-CM | POA: Diagnosis not present

## 2023-09-14 DIAGNOSIS — I2510 Atherosclerotic heart disease of native coronary artery without angina pectoris: Secondary | ICD-10-CM | POA: Diagnosis not present

## 2023-09-14 DIAGNOSIS — Z89412 Acquired absence of left great toe: Secondary | ICD-10-CM | POA: Diagnosis not present

## 2023-09-14 DIAGNOSIS — H3530 Unspecified macular degeneration: Secondary | ICD-10-CM | POA: Diagnosis not present

## 2023-09-14 DIAGNOSIS — Z89411 Acquired absence of right great toe: Secondary | ICD-10-CM | POA: Diagnosis not present

## 2023-09-14 DIAGNOSIS — E538 Deficiency of other specified B group vitamins: Secondary | ICD-10-CM | POA: Diagnosis not present

## 2023-09-21 DIAGNOSIS — N1831 Chronic kidney disease, stage 3a: Secondary | ICD-10-CM | POA: Diagnosis not present

## 2023-09-21 DIAGNOSIS — E1022 Type 1 diabetes mellitus with diabetic chronic kidney disease: Secondary | ICD-10-CM | POA: Diagnosis not present

## 2023-09-21 DIAGNOSIS — E1059 Type 1 diabetes mellitus with other circulatory complications: Secondary | ICD-10-CM | POA: Diagnosis not present

## 2023-09-21 DIAGNOSIS — I129 Hypertensive chronic kidney disease with stage 1 through stage 4 chronic kidney disease, or unspecified chronic kidney disease: Secondary | ICD-10-CM | POA: Diagnosis not present

## 2023-09-21 DIAGNOSIS — Z4781 Encounter for orthopedic aftercare following surgical amputation: Secondary | ICD-10-CM | POA: Diagnosis not present

## 2023-09-21 DIAGNOSIS — L89322 Pressure ulcer of left buttock, stage 2: Secondary | ICD-10-CM | POA: Diagnosis not present

## 2023-09-22 ENCOUNTER — Inpatient Hospital Stay (HOSPITAL_COMMUNITY)

## 2023-09-22 ENCOUNTER — Other Ambulatory Visit: Payer: Self-pay

## 2023-09-22 ENCOUNTER — Ambulatory Visit (INDEPENDENT_AMBULATORY_CARE_PROVIDER_SITE_OTHER): Admitting: Podiatry

## 2023-09-22 ENCOUNTER — Inpatient Hospital Stay (HOSPITAL_COMMUNITY)
Admission: RE | Admit: 2023-09-22 | Discharge: 2023-09-27 | DRG: 617 | Disposition: A | Discharge status: Home Health | Source: Ambulatory Visit | Attending: Family Medicine | Admitting: Family Medicine

## 2023-09-22 ENCOUNTER — Encounter (HOSPITAL_COMMUNITY): Payer: Self-pay

## 2023-09-22 ENCOUNTER — Telehealth: Payer: Self-pay | Admitting: Podiatry

## 2023-09-22 ENCOUNTER — Ambulatory Visit (INDEPENDENT_AMBULATORY_CARE_PROVIDER_SITE_OTHER)

## 2023-09-22 ENCOUNTER — Encounter: Payer: Self-pay | Admitting: Podiatry

## 2023-09-22 DIAGNOSIS — M2061 Acquired deformities of toe(s), unspecified, right foot: Secondary | ICD-10-CM | POA: Diagnosis not present

## 2023-09-22 DIAGNOSIS — E10628 Type 1 diabetes mellitus with other skin complications: Secondary | ICD-10-CM | POA: Diagnosis not present

## 2023-09-22 DIAGNOSIS — J449 Chronic obstructive pulmonary disease, unspecified: Secondary | ICD-10-CM | POA: Diagnosis present

## 2023-09-22 DIAGNOSIS — F419 Anxiety disorder, unspecified: Secondary | ICD-10-CM | POA: Diagnosis present

## 2023-09-22 DIAGNOSIS — M7989 Other specified soft tissue disorders: Secondary | ICD-10-CM | POA: Diagnosis not present

## 2023-09-22 DIAGNOSIS — F32A Depression, unspecified: Secondary | ICD-10-CM | POA: Diagnosis present

## 2023-09-22 DIAGNOSIS — G35 Multiple sclerosis: Secondary | ICD-10-CM | POA: Diagnosis present

## 2023-09-22 DIAGNOSIS — Z9841 Cataract extraction status, right eye: Secondary | ICD-10-CM

## 2023-09-22 DIAGNOSIS — R269 Unspecified abnormalities of gait and mobility: Secondary | ICD-10-CM | POA: Diagnosis present

## 2023-09-22 DIAGNOSIS — R5381 Other malaise: Secondary | ICD-10-CM | POA: Diagnosis present

## 2023-09-22 DIAGNOSIS — I2510 Atherosclerotic heart disease of native coronary artery without angina pectoris: Secondary | ICD-10-CM | POA: Diagnosis present

## 2023-09-22 DIAGNOSIS — L97529 Non-pressure chronic ulcer of other part of left foot with unspecified severity: Secondary | ICD-10-CM | POA: Diagnosis present

## 2023-09-22 DIAGNOSIS — E10610 Type 1 diabetes mellitus with diabetic neuropathic arthropathy: Secondary | ICD-10-CM | POA: Diagnosis present

## 2023-09-22 DIAGNOSIS — Z8249 Family history of ischemic heart disease and other diseases of the circulatory system: Secondary | ICD-10-CM

## 2023-09-22 DIAGNOSIS — G894 Chronic pain syndrome: Secondary | ICD-10-CM | POA: Diagnosis present

## 2023-09-22 DIAGNOSIS — E108 Type 1 diabetes mellitus with unspecified complications: Secondary | ICD-10-CM | POA: Diagnosis not present

## 2023-09-22 DIAGNOSIS — D696 Thrombocytopenia, unspecified: Secondary | ICD-10-CM | POA: Diagnosis present

## 2023-09-22 DIAGNOSIS — E10621 Type 1 diabetes mellitus with foot ulcer: Secondary | ICD-10-CM | POA: Diagnosis present

## 2023-09-22 DIAGNOSIS — Z9981 Dependence on supplemental oxygen: Secondary | ICD-10-CM

## 2023-09-22 DIAGNOSIS — L03032 Cellulitis of left toe: Secondary | ICD-10-CM

## 2023-09-22 DIAGNOSIS — Z9889 Other specified postprocedural states: Secondary | ICD-10-CM | POA: Diagnosis not present

## 2023-09-22 DIAGNOSIS — Z7982 Long term (current) use of aspirin: Secondary | ICD-10-CM

## 2023-09-22 DIAGNOSIS — E1022 Type 1 diabetes mellitus with diabetic chronic kidney disease: Secondary | ICD-10-CM | POA: Diagnosis present

## 2023-09-22 DIAGNOSIS — Z993 Dependence on wheelchair: Secondary | ICD-10-CM

## 2023-09-22 DIAGNOSIS — R600 Localized edema: Secondary | ICD-10-CM | POA: Diagnosis not present

## 2023-09-22 DIAGNOSIS — Z66 Do not resuscitate: Secondary | ICD-10-CM | POA: Diagnosis present

## 2023-09-22 DIAGNOSIS — Z89422 Acquired absence of other left toe(s): Secondary | ICD-10-CM | POA: Diagnosis not present

## 2023-09-22 DIAGNOSIS — Z89421 Acquired absence of other right toe(s): Secondary | ICD-10-CM | POA: Diagnosis not present

## 2023-09-22 DIAGNOSIS — E7800 Pure hypercholesterolemia, unspecified: Secondary | ICD-10-CM | POA: Diagnosis present

## 2023-09-22 DIAGNOSIS — N1831 Chronic kidney disease, stage 3a: Secondary | ICD-10-CM | POA: Diagnosis present

## 2023-09-22 DIAGNOSIS — N400 Enlarged prostate without lower urinary tract symptoms: Secondary | ICD-10-CM | POA: Diagnosis present

## 2023-09-22 DIAGNOSIS — I129 Hypertensive chronic kidney disease with stage 1 through stage 4 chronic kidney disease, or unspecified chronic kidney disease: Secondary | ICD-10-CM | POA: Diagnosis present

## 2023-09-22 DIAGNOSIS — E1069 Type 1 diabetes mellitus with other specified complication: Principal | ICD-10-CM | POA: Diagnosis present

## 2023-09-22 DIAGNOSIS — I10 Essential (primary) hypertension: Secondary | ICD-10-CM | POA: Diagnosis present

## 2023-09-22 DIAGNOSIS — Z87891 Personal history of nicotine dependence: Secondary | ICD-10-CM

## 2023-09-22 DIAGNOSIS — L02612 Cutaneous abscess of left foot: Secondary | ICD-10-CM

## 2023-09-22 DIAGNOSIS — Z9842 Cataract extraction status, left eye: Secondary | ICD-10-CM

## 2023-09-22 DIAGNOSIS — Z79899 Other long term (current) drug therapy: Secondary | ICD-10-CM

## 2023-09-22 DIAGNOSIS — L89316 Pressure-induced deep tissue damage of right buttock: Secondary | ICD-10-CM | POA: Diagnosis present

## 2023-09-22 DIAGNOSIS — I69354 Hemiplegia and hemiparesis following cerebral infarction affecting left non-dominant side: Secondary | ICD-10-CM

## 2023-09-22 DIAGNOSIS — L89326 Pressure-induced deep tissue damage of left buttock: Secondary | ICD-10-CM | POA: Diagnosis present

## 2023-09-22 DIAGNOSIS — Z794 Long term (current) use of insulin: Secondary | ICD-10-CM | POA: Diagnosis not present

## 2023-09-22 DIAGNOSIS — Z7902 Long term (current) use of antithrombotics/antiplatelets: Secondary | ICD-10-CM

## 2023-09-22 DIAGNOSIS — E1042 Type 1 diabetes mellitus with diabetic polyneuropathy: Secondary | ICD-10-CM | POA: Diagnosis present

## 2023-09-22 DIAGNOSIS — E1051 Type 1 diabetes mellitus with diabetic peripheral angiopathy without gangrene: Secondary | ICD-10-CM | POA: Diagnosis present

## 2023-09-22 DIAGNOSIS — Z961 Presence of intraocular lens: Secondary | ICD-10-CM | POA: Diagnosis present

## 2023-09-22 DIAGNOSIS — M86172 Other acute osteomyelitis, left ankle and foot: Secondary | ICD-10-CM | POA: Diagnosis present

## 2023-09-22 DIAGNOSIS — I7090 Unspecified atherosclerosis: Secondary | ICD-10-CM | POA: Diagnosis not present

## 2023-09-22 DIAGNOSIS — M1990 Unspecified osteoarthritis, unspecified site: Secondary | ICD-10-CM | POA: Diagnosis present

## 2023-09-22 DIAGNOSIS — M129 Arthropathy, unspecified: Secondary | ICD-10-CM | POA: Diagnosis not present

## 2023-09-22 DIAGNOSIS — Z8673 Personal history of transient ischemic attack (TIA), and cerebral infarction without residual deficits: Secondary | ICD-10-CM

## 2023-09-22 DIAGNOSIS — Z955 Presence of coronary angioplasty implant and graft: Secondary | ICD-10-CM

## 2023-09-22 DIAGNOSIS — I252 Old myocardial infarction: Secondary | ICD-10-CM

## 2023-09-22 DIAGNOSIS — M869 Osteomyelitis, unspecified: Secondary | ICD-10-CM | POA: Diagnosis present

## 2023-09-22 DIAGNOSIS — M86671 Other chronic osteomyelitis, right ankle and foot: Secondary | ICD-10-CM | POA: Diagnosis not present

## 2023-09-22 DIAGNOSIS — K056 Periodontal disease, unspecified: Secondary | ICD-10-CM | POA: Diagnosis present

## 2023-09-22 DIAGNOSIS — M21271 Flexion deformity, right ankle and toes: Secondary | ICD-10-CM | POA: Diagnosis present

## 2023-09-22 LAB — CBC
HCT: 37.4 % — ABNORMAL LOW (ref 39.0–52.0)
Hemoglobin: 12.7 g/dL — ABNORMAL LOW (ref 13.0–17.0)
MCH: 34 pg (ref 26.0–34.0)
MCHC: 34 g/dL (ref 30.0–36.0)
MCV: 100 fL (ref 80.0–100.0)
Platelets: 53 K/uL — ABNORMAL LOW (ref 150–400)
RBC: 3.74 MIL/uL — ABNORMAL LOW (ref 4.22–5.81)
RDW: 12.6 % (ref 11.5–15.5)
WBC: 7 K/uL (ref 4.0–10.5)
nRBC: 0 % (ref 0.0–0.2)

## 2023-09-22 LAB — COMPREHENSIVE METABOLIC PANEL WITH GFR
ALT: 14 U/L (ref 0–44)
AST: 17 U/L (ref 15–41)
Albumin: 2.4 g/dL — ABNORMAL LOW (ref 3.5–5.0)
Alkaline Phosphatase: 213 U/L — ABNORMAL HIGH (ref 38–126)
Anion gap: 7 (ref 5–15)
BUN: 35 mg/dL — ABNORMAL HIGH (ref 8–23)
CO2: 26 mmol/L (ref 22–32)
Calcium: 8.6 mg/dL — ABNORMAL LOW (ref 8.9–10.3)
Chloride: 101 mmol/L (ref 98–111)
Creatinine, Ser: 1.85 mg/dL — ABNORMAL HIGH (ref 0.61–1.24)
GFR, Estimated: 37 mL/min — ABNORMAL LOW (ref >60)
Glucose, Bld: 285 mg/dL — ABNORMAL HIGH (ref 70–99)
Potassium: 4.8 mmol/L (ref 3.5–5.1)
Sodium: 134 mmol/L — ABNORMAL LOW (ref 135–145)
Total Bilirubin: 0.9 mg/dL (ref 0.0–1.2)
Total Protein: 7.1 g/dL (ref 6.5–8.1)

## 2023-09-22 LAB — SEDIMENTATION RATE: Sed Rate: 73 mm/h — ABNORMAL HIGH (ref 0–16)

## 2023-09-22 LAB — C-REACTIVE PROTEIN: CRP: 12.3 mg/dL — ABNORMAL HIGH (ref <1.0)

## 2023-09-22 LAB — PROTIME-INR
INR: 1 (ref 0.8–1.2)
Prothrombin Time: 13.5 s (ref 11.4–15.2)

## 2023-09-22 LAB — GLUCOSE, CAPILLARY: Glucose-Capillary: 292 mg/dL — ABNORMAL HIGH (ref 70–99)

## 2023-09-22 MED ORDER — TRAZODONE HCL 50 MG PO TABS
150.0000 mg | ORAL_TABLET | Freq: Every day | ORAL | Status: DC
  Administered 2023-09-22 – 2023-09-26 (×5): 150 mg via ORAL
  Filled 2023-09-22 (×5): qty 1

## 2023-09-22 MED ORDER — INSULIN NPH (HUMAN) (ISOPHANE) 100 UNIT/ML ~~LOC~~ SUSP
15.0000 [IU] | Freq: Two times a day (BID) | SUBCUTANEOUS | Status: DC
  Administered 2023-09-22 – 2023-09-24 (×3): 15 [IU] via SUBCUTANEOUS
  Filled 2023-09-22: qty 10

## 2023-09-22 MED ORDER — ATORVASTATIN CALCIUM 10 MG PO TABS
10.0000 mg | ORAL_TABLET | Freq: Every day | ORAL | Status: DC
  Administered 2023-09-22 – 2023-09-26 (×5): 10 mg via ORAL
  Filled 2023-09-22 (×5): qty 1

## 2023-09-22 MED ORDER — ACETAMINOPHEN 325 MG PO TABS
650.0000 mg | ORAL_TABLET | Freq: Four times a day (QID) | ORAL | Status: DC | PRN
  Administered 2023-09-26 – 2023-09-27 (×2): 650 mg via ORAL
  Filled 2023-09-22 (×3): qty 2

## 2023-09-22 MED ORDER — ONDANSETRON HCL 4 MG PO TABS: 4.0000 mg | ORAL_TABLET | Freq: Four times a day (QID) | ORAL | Status: DC | PRN

## 2023-09-22 MED ORDER — TAPENTADOL HCL ER 100 MG PO TB12: 200.0000 mg | ORAL_TABLET | Freq: Two times a day (BID) | ORAL | Status: DC

## 2023-09-22 MED ORDER — FINASTERIDE 5 MG PO TABS
5.0000 mg | ORAL_TABLET | Freq: Every morning | ORAL | Status: DC
  Administered 2023-09-23 – 2023-09-27 (×5): 5 mg via ORAL
  Filled 2023-09-22 (×5): qty 1

## 2023-09-22 MED ORDER — ALBUTEROL SULFATE (2.5 MG/3ML) 0.083% IN NEBU: 2.5000 mg | INHALATION_SOLUTION | RESPIRATORY_TRACT | Status: DC | PRN

## 2023-09-22 MED ORDER — AMLODIPINE BESYLATE 5 MG PO TABS
5.0000 mg | ORAL_TABLET | Freq: Every morning | ORAL | Status: DC
  Administered 2023-09-23 – 2023-09-27 (×4): 5 mg via ORAL
  Filled 2023-09-22: qty 2
  Filled 2023-09-22 (×3): qty 1

## 2023-09-22 MED ORDER — BISACODYL 10 MG RE SUPP
10.0000 mg | Freq: Every day | RECTAL | Status: DC | PRN
Start: 2023-09-22 — End: 2023-09-28

## 2023-09-22 MED ORDER — ACETAMINOPHEN 650 MG RE SUPP: 650.0000 mg | Freq: Four times a day (QID) | RECTAL | Status: DC | PRN

## 2023-09-22 MED ORDER — POLYETHYLENE GLYCOL 3350 17 G PO PACK
17.0000 g | PACK | ORAL | Status: DC | PRN
  Administered 2023-09-24: 17 g via ORAL
  Filled 2023-09-22: qty 1

## 2023-09-22 MED ORDER — ORAL CARE MOUTH RINSE
15.0000 mL | OROMUCOSAL | Status: DC | PRN
Start: 2023-09-22 — End: 2023-09-28

## 2023-09-22 MED ORDER — ONDANSETRON HCL 4 MG/2ML IJ SOLN: 4.0000 mg | Freq: Four times a day (QID) | INTRAMUSCULAR | Status: DC | PRN

## 2023-09-22 MED ORDER — TAPENTADOL HCL ER 100 MG PO TB12
200.0000 mg | ORAL_TABLET | Freq: Two times a day (BID) | ORAL | Status: DC
  Administered 2023-09-23 – 2023-09-27 (×9): 200 mg via ORAL
  Filled 2023-09-22 (×10): qty 2

## 2023-09-22 MED ORDER — OXYCODONE HCL 5 MG PO TABS
10.0000 mg | ORAL_TABLET | Freq: Four times a day (QID) | ORAL | Status: DC | PRN
  Administered 2023-09-23 (×2): 15 mg via ORAL
  Administered 2023-09-23: 10 mg via ORAL
  Administered 2023-09-24 – 2023-09-26 (×5): 15 mg via ORAL
  Administered 2023-09-26: 10 mg via ORAL
  Administered 2023-09-26 – 2023-09-27 (×5): 15 mg via ORAL
  Filled 2023-09-22: qty 2
  Filled 2023-09-22 (×12): qty 3
  Filled 2023-09-22: qty 2
  Filled 2023-09-22: qty 3

## 2023-09-22 MED ORDER — INSULIN ASPART 100 UNIT/ML IJ SOLN
0.0000 [IU] | Freq: Three times a day (TID) | INTRAMUSCULAR | Status: DC
  Administered 2023-09-23: 7 [IU] via SUBCUTANEOUS
  Administered 2023-09-23: 2 [IU] via SUBCUTANEOUS
  Administered 2023-09-24: 9 [IU] via SUBCUTANEOUS
  Administered 2023-09-24: 5 [IU] via SUBCUTANEOUS
  Administered 2023-09-25: 1 [IU] via SUBCUTANEOUS
  Administered 2023-09-25: 3 [IU] via SUBCUTANEOUS
  Administered 2023-09-26: 1 [IU] via SUBCUTANEOUS
  Administered 2023-09-26: 3 [IU] via SUBCUTANEOUS
  Administered 2023-09-26: 2 [IU] via SUBCUTANEOUS
  Administered 2023-09-27: 3 [IU] via SUBCUTANEOUS
  Administered 2023-09-27 (×2): 2 [IU] via SUBCUTANEOUS
  Filled 2023-09-22: qty 1

## 2023-09-22 MED ORDER — ASPIRIN 81 MG PO CHEW
81.0000 mg | CHEWABLE_TABLET | Freq: Every morning | ORAL | Status: DC
  Administered 2023-09-23 – 2023-09-24 (×2): 81 mg via ORAL
  Filled 2023-09-22 (×7): qty 1

## 2023-09-22 MED ORDER — METOPROLOL TARTRATE 25 MG PO TABS
25.0000 mg | ORAL_TABLET | Freq: Two times a day (BID) | ORAL | Status: DC
  Administered 2023-09-22 – 2023-09-27 (×8): 25 mg via ORAL
  Filled 2023-09-22 (×4): qty 1
  Filled 2023-09-22: qty 2
  Filled 2023-09-22 (×4): qty 1

## 2023-09-22 MED ORDER — DULOXETINE HCL 60 MG PO CPEP
60.0000 mg | ORAL_CAPSULE | Freq: Two times a day (BID) | ORAL | Status: DC
  Administered 2023-09-22 – 2023-09-27 (×10): 60 mg via ORAL
  Filled 2023-09-22 (×10): qty 1

## 2023-09-22 MED ORDER — OXYCODONE HCL 5 MG PO TABS: 10.0000 mg | ORAL_TABLET | Freq: Four times a day (QID) | ORAL | Status: DC | PRN

## 2023-09-22 MED ORDER — ENOXAPARIN SODIUM 40 MG/0.4ML IJ SOSY
40.0000 mg | PREFILLED_SYRINGE | INTRAMUSCULAR | Status: DC
  Administered 2023-09-22 – 2023-09-27 (×6): 40 mg via SUBCUTANEOUS
  Filled 2023-09-22 (×6): qty 0.4

## 2023-09-22 NOTE — Progress Notes (Signed)
 Pt High Fall Risk per protocol, yellow arm band on and mats down, bed in the lowest position and call light within reach. Bed alarm on.

## 2023-09-22 NOTE — Progress Notes (Signed)
 Chief Complaint  Patient presents with   Toe Pain    L 4th Toe Ulcer. Red. Draining.  Home health nurse applied betadine and gause last night. Diabetic.  Plavix     HPI: 78 y.o. male presents today with concern for left fourth toe infection.  He has history of digital amputations of toes 1, 2, 3 left foot and toe.  States that home health was assisting within the past couple days and noticed ulceration with purulent discharge.  He is accompanied by his wife today.  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   IMO SNOMED Dx Update Oct 2024     Charcot's arthropathy, diabetic (HCC)    left foot   Chronic pain syndrome followed by dr mark phillps at pain clinic   secondary to MS and diabetic neuropathy   Chronic periodontal disease    CKD (chronic kidney disease), stage II    hypertensive per PCP note   Coronary artery disease hx MI  s/p  DES to [facility] 2005   hx cardiologist-- dr morris until he retired,  now followed by pcp , dr shepard   Depression    Diabetic neuropathy [facility])    Essential tremor    Fecal impaction (HCC) 03/31/2017   Gait disorder    uses walker   History of CVA (cerebrovascular accident)    2011-  right subcortical cva--  residual left mininmal side weakness and no use of left head   History of CVA with residual deficit    2011  --  right subcortical cva w/ mininmal left hemiparesis   History of diabetic ulcer of foot    2010  left foot   History of MI (myocardial infarction)    2005   Hypertension    dr shepard   MS (multiple sclerosis) [facility])    dx 1985   Osteomyelitis of great toe of left foot (HCC) 01/30/2023   PONV (postoperative nausea and vomiting)    S/P drug eluting coronary stent placement    2005  to midRCA   Type 1 diabetes mellitus on insulin  therapy [facility])     Past Surgical History:  Procedure Laterality Date    AMPUTATION Right 03/01/2023   Procedure: AMPUTATION RIGHT GREAT TOE;  Surgeon: Malvin Marsa FALCON, DPM;  Location: [facility] OR;  Service: Orthopedics/Podiatry;  Laterality: Right;   AMPUTATION TOE Left 01/30/2023   Procedure: AMPUTATION TOE;  Surgeon: Malvin Marsa FALCON, DPM;  Location: [facility] OR;  Service: Orthopedics/Podiatry;  Laterality: Left;  Amputation of left great toe   AMPUTATION TOE Left 03/01/2023   Procedure: AMPUTATION LEFT SECOND TOE;  Surgeon: Malvin Marsa FALCON, DPM;  Location: [facility] OR;  Service: Orthopedics/Podiatry;  Laterality: Left;   AMPUTATION TOE Left 06/21/2023   Procedure: Left 3rd toe amputation;  Surgeon: Malvin Marsa FALCON, DPM;  Location: [facility] OR;  Service: Orthopedics/Podiatry;  Laterality: Left;  Left 3rd toe amputation   CATARACT EXTRACTION W/ INTRAOCULAR LENS  IMPLANT, BILATERAL  Nov 2016   CORONARY ANGIOPLASTY WITH STENT PLACEMENT  10-18-2003  dr morris   DES x1  to  mRCA (high-grade lesion), diffuse diabetic abnormalities throughout the entire coronary tree w/ diffuse calcification and segmental plaquing throughout but noncritical disease   HARDWARE REMOVAL Left 04/20/2012   Procedure: Left foot removal deep retained screw;  Surgeon: Jerona LULLA Sage, MD;  Location: [facility] OR;  Service: Orthopedics;  Laterality: Left;   MULTIPLE EXTRACTIONS WITH ALVEOLOPLASTY N/A 05/15/2015   Procedure: MULTIPLE EXTRACTIONS;  Surgeon: Lamar Sabal, DDS;  Location: [facility];  Service: Dentistry;  Laterality: N/A;   ORIF ANKLE FRACTURE  12/30/2011   Procedure: OPEN REDUCTION INTERNAL FIXATION (ORIF) ANKLE FRACTURE;  Surgeon: Jerona LULLA Sage, MD;  Location: [facility] OR;  Service: Orthopedics;  Laterality: Left;  Excision Bone, Abscess Left Charcot Foot, Place Antibiotic Beads, Fixation Charcot   REMOVAL SUBMANDIDULAR GLAND, BILATERAL  1970's   benign    No Known Allergies  ROS    Physical Exam: There were no vitals filed for this visit.  General: The patient is alert  and oriented x3 in no acute distress.  Dermatology: Necrotic ulceration left fourth toe PIPJ with purulent discharge, positive probe to bone.  Ascending erythema proximal to the dorsal midfoot.    Vascular: Foot faintly palpable pedal pulses.  Capillary refill intact to the digits.  Diminished pedal hair growth.  Left foot edema versus right with fourth toe edematous.  Left lower extremity warm to touch versus right.  Neurological: Light touch sensation decreased.  Protective sensation absent.  Musculoskeletal Exam: Charcot foot deformity with prior right first toe amputation, left first, second, third toe amputations.  Radiographic Exam: Left foot 3 views Prior first, second, third toe amputation present.  There is some cortical erosion present intermediate phalanx with osseous irregularity at the head of the proximal phalanx of the fourth toe suggesting osteomyelitis.  There is generalized osteopenia as well as chronic appearing Charcot foot deformity.  Assessment/Plan of Care: 1. Cellulitis and abscess of toe of left foot   2. Acute osteomyelitis of toe, left (HCC)      No orders of the defined types were placed in this encounter.  None  Discussed clinical findings with patient today.  Radiographs reviewed with patient.  Did obtain wound culture of the purulent drainage today.  Findings consistent with osteomyelitis, patient needs hospital admission for management of the fourth toe osteomyelitis and due to his overall co morbidities.  Will set up direct admission with hospitalist.  On-call podiatrist notified.  Start empiric antibiotics and obtain infectious workup.  Did have ABI showing normal values left foot, noncompressible vessels right foot back in May, may consider repeat vascular studies.  Betadine wet-to-dry dressing applied.   Brenon Antosh L. Lamount MAUL, AACFAS [facility]     [address] [place], [place] [address]                 Office [phone]  Fax [phone]

## 2023-09-22 NOTE — Progress Notes (Signed)
 Pt admitted for osteomyelitis of left foot. Pt not on tele, no fluids at this time. BP 129/68 (BP Location: Right Arm)   Pulse (!) 59   Temp 98.2 F (36.8 C) (Oral)   Resp 17   SpO2 95%  Blood sugar 292. Pt oriented to room, On call Dr. Fredricka for admission orders. No other needs voiced at this time. [name]. 09/22/23 4:53 PM

## 2023-09-22 NOTE — Telephone Encounter (Signed)
 Received call from pts wife but the message did not come thru.  I called her back and she said she just got off the phone with [place] and that she is getting ready to take him to be admitted now.   She said you were to have called in a antibiotic if they were not able to admit him today. But since they are getting ready to be admitted to the hospital they are ok.

## 2023-09-22 NOTE — H&P (Addendum)
 History and Physical    Patient: [name] Laufer Doorn Jr. FMW:[id number] DOB: 1945-06-16 DOA: 09/22/2023 DOS: the patient was seen and examined on 09/22/2023 PCP: Shepard Ade, MD  Patient coming from: Home  Chief Complaint: Left foot fourth toe ulcer with purulent discharge.  HPI: [name]. is a 78 y.o. 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  Per history obtained-apparently he developed an ulcer approximately a month ago-and has been on the observation by his podiatrist.  Approximately 2-3 days back-this ulcer started draining purulent material.  He was evaluated by his home health RN yesterday, and asked to go to podiatry office.  At the podiatry office-purulent discharge was confirmed-positive probe to the bone-cultures were obtained-x-ray apparently was consistent with osteomyelitis.  He was then referred to the hospitalist service as a direct admit.  Patient denies any fever There is no history of nausea, vomiting or diarrhea There is no history of chest pain or shortness of breath There is no history of abdominal pain.   Review of Systems: As mentioned in the history of present illness. All other systems reviewed and are negative. 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   IMO SNOMED Dx Update Oct 2024     Charcot's arthropathy, diabetic (HCC)    left foot   Chronic pain syndrome followed by dr mark phillps at pain clinic   secondary to MS and diabetic neuropathy   Chronic periodontal disease    CKD (chronic kidney disease), stage II    hypertensive per PCP note   Coronary artery disease hx MI  s/p  DES to [facility] 2005    hx cardiologist-- dr morris until he retired,  now followed by pcp , dr shepard   Depression    Diabetic neuropathy [facility])    Essential tremor    Fecal impaction (HCC) 03/31/2017   Gait disorder    uses walker   History of CVA (cerebrovascular accident)    2011-  right subcortical cva--  residual left mininmal side weakness and no use of left head   History of CVA with residual deficit    2011  --  right subcortical cva w/ mininmal left hemiparesis   History of diabetic ulcer of foot    2010  left foot   History of MI (myocardial infarction)    2005   Hypertension    dr shepard   MS (multiple sclerosis) [facility])    dx 1985   Osteomyelitis of great toe of left foot (HCC) 01/30/2023   PONV (postoperative nausea and vomiting)    S/P drug eluting coronary stent placement    2005  to midRCA   Type 1 diabetes mellitus on insulin  therapy [facility])    Past Surgical History:  Procedure Laterality Date   AMPUTATION Right 03/01/2023   Procedure: AMPUTATION RIGHT GREAT TOE;  Surgeon: Malvin Marsa FALCON, DPM;  Location: [facility] OR;  Service: Orthopedics/Podiatry;  Laterality: Right;   AMPUTATION TOE Left 01/30/2023   Procedure: AMPUTATION TOE;  Surgeon: Malvin Marsa FALCON, DPM;  Location: [facility] OR;  Service: Orthopedics/Podiatry;  Laterality: Left;  Amputation of left great toe   AMPUTATION TOE Left 03/01/2023   Procedure: AMPUTATION LEFT SECOND TOE;  Surgeon: Malvin Marsa FALCON, DPM;  Location: [facility] OR;  Service: Orthopedics/Podiatry;  Laterality: Left;   AMPUTATION TOE Left 06/21/2023   Procedure: Left 3rd toe amputation;  Surgeon: Malvin Marsa FALCON, DPM;  Location: [facility] OR;  Service: Orthopedics/Podiatry;  Laterality: Left;  Left 3rd toe amputation   CATARACT EXTRACTION W/ INTRAOCULAR LENS  IMPLANT, BILATERAL  Nov 2016   CORONARY ANGIOPLASTY WITH STENT PLACEMENT  10-18-2003  dr morris   DES x1  to  [facility] (high-grade lesion), diffuse diabetic abnormalities throughout the entire coronary  tree w/ diffuse calcification and segmental plaquing throughout but noncritical disease   HARDWARE REMOVAL Left 04/20/2012   Procedure: Left foot removal deep retained screw;  Surgeon: Jerona LULLA Sage, MD;  Location: [facility] OR;  Service: Orthopedics;  Laterality: Left;   MULTIPLE EXTRACTIONS WITH ALVEOLOPLASTY N/A 05/15/2015   Procedure: MULTIPLE EXTRACTIONS;  Surgeon: Lamar Sabal, DDS;  Location: [facility];  Service: Dentistry;  Laterality: N/A;   ORIF ANKLE FRACTURE  12/30/2011   Procedure: OPEN REDUCTION INTERNAL FIXATION (ORIF) ANKLE FRACTURE;  Surgeon: Jerona LULLA Sage, MD;  Location: [facility] OR;  Service: Orthopedics;  Laterality: Left;  Excision Bone, Abscess Left Charcot Foot, Place Antibiotic Beads, Fixation Charcot   REMOVAL SUBMANDIDULAR GLAND, BILATERAL  1970's   benign   Social History:  reports that he quit smoking about 11 years ago. His smoking use included cigars and cigarettes. He started smoking about 37 years ago. He has a 13 pack-year smoking history. He has never used smokeless tobacco. He reports that he does not drink alcohol  and does not use drugs.  No Known Allergies  Family History  Problem Relation Age of Onset   Tremor Mother    Heart disease Father    Celiac disease Brother    Cancer - Prostate Paternal Grandfather     Prior to Admission medications   Medication Sig Start Date End Date Taking? Authorizing Provider  amLODipine  (NORVASC ) 5 MG tablet Take 5 mg by mouth every morning.    Yes [provider]  aspirin  81 MG tablet Take 81 mg by mouth in the morning.   Yes [provider]  atorvastatin  (LIPITOR) 10 MG tablet Take 10 mg by mouth at bedtime.   Yes [provider]  Chlorpheniramine Maleate (ALLERGY RELIEF PO) Take 1 tablet by mouth as needed.   Yes [provider]  clopidogrel  (PLAVIX ) 75 MG tablet Take 75 mg by mouth in the morning.   Yes [provider]  CRANBERRY PO Take 1 tablet by mouth in the morning  and at bedtime.   Yes [provider]  DULoxetine  (CYMBALTA ) 60 MG capsule Take 60 mg by mouth 2 (two) times daily.   Yes [provider]  finasteride  (PROSCAR ) 5 MG tablet Take 5 mg by mouth in the morning.   Yes [provider]  insulin  lispro (HUMALOG KWIKPEN) 100 UNIT/ML KwikPen Inject 0-15 Units into the skin 3 (three) times daily as needed (BS > 150).   Yes [provider]  Insulin  NPH, Human,, Isophane, (HUMULIN  N KWIKPEN) 100 UNIT/ML Kiwkpen Inject 10-20 Units into the skin See admin instructions. Inject 20 units in the morning and 10 units in the evening depending on blood sugar.   Yes [provider]  metoprolol  tartrate (LOPRESSOR ) 25 MG tablet Take 25 mg by mouth 2 (two) times daily.   Yes [provider]  Nutritional Supplements (NUTRITIONAL SHAKE) LIQD Take 1 Bottle by mouth daily.   Yes [provider]  Nystatin  (GERHARDT'S BUTT CREAM) CREA Apply 1 Application topically 3 (three) times daily. 06/23/23  Yes Will Almarie MATSU, MD  oxyCODONE  (ROXICODONE ) 15 MG immediate release tablet Take 1 tablet (15 mg total) by mouth every 6 (six) hours as needed for pain. 09/29/23  Yes   Polyethylene Glycol 3350  (PEG 3350 ) 17 GM/SCOOP POWD Take 17 g by mouth 2 (two) times daily. Patient taking differently: Take 17 g by mouth as needed. 06/23/23  Yes Will Almarie MATSU, MD  Tapentadol  HCl (NUCYNTA  ER) 200 MG TB12 Take 200 mg by mouth every 12 (twelve) hours. 09/29/23  Yes   traZODone  (DESYREL ) 150 MG tablet Take 150 mg by mouth at bedtime.   Yes [provider]    Physical Exam: Vitals:   09/22/23 1500  BP: 129/68  Pulse: (!) 59  Resp: 17  Temp: 98.2 F (36.8 C)  TempSrc: Oral  SpO2: 95%   Gen Exam:Alert awake-not in any distress HEENT:atraumatic, normocephalic Chest: B/L clear to auscultation anteriorly CVS:S1S2 regular Abdomen:soft non tender, non distended Extremities:no edema-see picture below regarding left  fourth toe. Neurology: Non focal Skin: no rash   Data Reviewed: CBC/chemistries pending.  Assessment and Plan: Left fourth toe ulceration with acute osteomyelitis Hemodynamically stable-nontoxic-appearing-afebrile Tentatively scheduled for toe amputation tomorrow per podiatry note Given overall stability-reasonable to observe antibiotics-to increase yield of possible cultures. If he were to develop fever/toxic appearance/hemodynamic instability-then we can always reconsider and place him on antibiotics Checking ESR, CRP and regular CBC/chemistries. Keeping n.p.o. postmidnight Await formal podiatry evaluation.  Addendum Informed by podiatry-Dr. Franky Patel-that patient needs repeat ABI studies/MRI foot-possible amputation either Sunday or Monday based on these results.  Will discontinue n.p.o. order.  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 Continue aspirin /statin Apparently has been holding Plavix  which will be continued but held until he is postop.  History of CAD-s/p PCI 2006 No anginal symptoms Continue antiplatelet/statin/beta-blocker  History of CVA with left-sided hemiparesis Continue antiplatelet/statin At baseline-minimally ambulatory with the help of a walker (to go to the bathroom) but mostly uses a wheelchair  History of COPD on home O2 Lungs clear As needed bronchodilators  History of multiple sclerosis At baseline Does not appear to be on any disease modifying agents.  CKD stage IIIa Await labs Avoid nephrotoxic agents.  History of chronic pain syndrome Continue Nucynta /as needed oxycodone .  History of peripheral  neuropathy Likely related to diabetes Continue Cymbalta /narcotics.  BPH Finasteride   Anxiety/depression Cymbalta /trazodone .  Chronic debility/deconditioning Mostly wheelchair-bound but able to walk to the bathroom with a walker. PT/OT eval   Advance Care Planning:   Code Status: Limited: Do not attempt resuscitation (DNR) -DNR-LIMITED -Do Not Intubate/DNI (confirmed with spouse at bedside)  Consults: Podiatry  Family Communication: Spouse at bedside  Severity of Illness: The appropriate patient status for this patient is INPATIENT. Inpatient status is judged to be reasonable and necessary in order to provide the required intensity of service to ensure the patient's safety. The patient's presenting symptoms, physical exam findings, and initial radiographic and laboratory data in the context of their chronic comorbidities is felt to place them at high risk for further clinical deterioration. Furthermore, it is not anticipated that the patient will be  medically stable for discharge from the hospital within 2 midnights of admission.   * I certify that at the point of admission it is my clinical judgment that the patient will require inpatient hospital care spanning beyond 2 midnights from the point of admission due to high intensity of service, high risk for further deterioration and high frequency of surveillance required.*  Author: Donalda Applebaum, MD 09/22/2023 4:38 PM  For on call review [email or web address].

## 2023-09-22 NOTE — Progress Notes (Signed)
 Ordered dinner tray for pt, pt has no teeth so ordered soft foods.

## 2023-09-22 NOTE — Plan of Care (Signed)
 Accepted as a direct admission from [facility] discussed with Dr. Lamount. [name] is a 78 year old male with history of uncontrolled DM type I and peripheral vascular disease with prior amputations who presented with infection of the left fourth toe.  Found to have an ulcer that was able to be probed to the bone concerning for osteomyelitis.  Culture obtained in the office.  Podiatry planning on possibly doing surgery on either Sunday or Monday.

## 2023-09-22 NOTE — Progress Notes (Signed)
 Pt states he wears O2 at night- O2 tubing taken into the room.

## 2023-09-22 NOTE — Telephone Encounter (Signed)
 Patients wife called in to find out the status of admission to the hospital. Also if he isn't admitted today he will need a refill of his antibiotic medication. His pharmacy closes at 5pm today and closed on weekends.

## 2023-09-22 NOTE — Progress Notes (Signed)
 Pt admitted as a direct admit into [facility]. Contacted pt placement and they stated pt will have Dr. Raenelle as attending. Neil obtained CBG and it was 292. Family at bedside. R foot great toe is absent and the next toe has a tiny blackened area on the tip. Skin scaly and dry on lower extremities. Family states that Home Health comes once weekly to check pt and that they mentioned area on bottom, using butt cream.  Will check rest of skin when pt transfers to the bed. Pharmacy tech present to do med rec.

## 2023-09-23 DIAGNOSIS — M86172 Other acute osteomyelitis, left ankle and foot: Secondary | ICD-10-CM

## 2023-09-23 DIAGNOSIS — G894 Chronic pain syndrome: Secondary | ICD-10-CM | POA: Diagnosis not present

## 2023-09-23 DIAGNOSIS — E7800 Pure hypercholesterolemia, unspecified: Secondary | ICD-10-CM | POA: Diagnosis not present

## 2023-09-23 DIAGNOSIS — I10 Essential (primary) hypertension: Secondary | ICD-10-CM | POA: Diagnosis not present

## 2023-09-23 DIAGNOSIS — E108 Type 1 diabetes mellitus with unspecified complications: Secondary | ICD-10-CM | POA: Diagnosis not present

## 2023-09-23 LAB — CBC
HCT: 36 % — ABNORMAL LOW (ref 39.0–52.0)
Hemoglobin: 12.1 g/dL — ABNORMAL LOW (ref 13.0–17.0)
MCH: 33.5 pg (ref 26.0–34.0)
MCHC: 33.6 g/dL (ref 30.0–36.0)
MCV: 99.7 fL (ref 80.0–100.0)
Platelets: 52 K/uL — ABNORMAL LOW (ref 150–400)
RBC: 3.61 MIL/uL — ABNORMAL LOW (ref 4.22–5.81)
RDW: 12.6 % (ref 11.5–15.5)
WBC: 6.1 K/uL (ref 4.0–10.5)
nRBC: 0 % (ref 0.0–0.2)

## 2023-09-23 LAB — GLUCOSE, CAPILLARY
Glucose-Capillary: 145 mg/dL — ABNORMAL HIGH (ref 70–99)
Glucose-Capillary: 64 mg/dL — ABNORMAL LOW (ref 70–99)
Glucose-Capillary: 68 mg/dL — ABNORMAL LOW (ref 70–99)
Glucose-Capillary: 232 mg/dL — ABNORMAL HIGH (ref 70–99)
Glucose-Capillary: 316 mg/dL — ABNORMAL HIGH (ref 70–99)
Glucose-Capillary: 160 mg/dL — ABNORMAL HIGH (ref 70–99)
Glucose-Capillary: 174 mg/dL — ABNORMAL HIGH (ref 70–99)
Glucose-Capillary: 76 mg/dL (ref 70–99)

## 2023-09-23 LAB — BASIC METABOLIC PANEL WITH GFR
Anion gap: 5 (ref 5–15)
BUN: 30 mg/dL — ABNORMAL HIGH (ref 8–23)
CO2: 30 mmol/L (ref 22–32)
Calcium: 8.4 mg/dL — ABNORMAL LOW (ref 8.9–10.3)
Chloride: 101 mmol/L (ref 98–111)
Creatinine, Ser: 1.52 mg/dL — ABNORMAL HIGH (ref 0.61–1.24)
GFR, Estimated: 47 mL/min — ABNORMAL LOW (ref >60)
Glucose, Bld: 90 mg/dL (ref 70–99)
Potassium: 4.2 mmol/L (ref 3.5–5.1)
Sodium: 136 mmol/L (ref 135–145)

## 2023-09-23 MED ORDER — GERHARDT'S BUTT CREAM
TOPICAL_CREAM | Freq: Two times a day (BID) | CUTANEOUS | Status: DC
  Filled 2023-09-23: qty 60

## 2023-09-23 MED ORDER — DEXTROSE 50 % IV SOLN
INTRAVENOUS | Status: AC
  Administered 2023-09-23: 50 mL
  Filled 2023-09-23: qty 50

## 2023-09-23 NOTE — Plan of Care (Signed)
 Upon doing assessments, noticed some shakiness and hunger. Pt's sugar was 64. Hypoglycemia protocol initiated, gave orange juice. Rechecked after 15 mins, it went up to 68. Paged the provider abt the sugar and agreed to give d50 as per protocol. Rechecked sugar again it was 232.

## 2023-09-23 NOTE — Plan of Care (Signed)
  Problem: Education: Goal: Knowledge of General Education information will improve Description: Including pain rating scale, medication(s)/side effects and non-pharmacologic comfort measures Outcome: Progressing   Problem: Health Behavior/Discharge Planning: Goal: Ability to manage health-related needs will improve Outcome: Progressing   Problem: Clinical Measurements: Goal: Ability to maintain clinical measurements within normal limits will improve Outcome: Progressing Goal: Will remain free from infection Outcome: Progressing Goal: Diagnostic test results will improve Outcome: Progressing Goal: Respiratory complications will improve Outcome: Progressing Goal: Cardiovascular complication will be avoided Outcome: Progressing   Problem: Activity: Goal: Risk for activity intolerance will decrease Outcome: Progressing   Problem: Nutrition: Goal: Adequate nutrition will be maintained Outcome: Progressing   Problem: Coping: Goal: Level of anxiety will decrease Outcome: Progressing   Problem: Elimination: Goal: Will not experience complications related to bowel motility Outcome: Progressing Goal: Will not experience complications related to urinary retention Outcome: Progressing   Problem: Pain Managment: Goal: General experience of comfort will improve and/or be controlled Outcome: Progressing   Problem: Safety: Goal: Ability to remain free from injury will improve Outcome: Progressing   Problem: Skin Integrity: Goal: Risk for impaired skin integrity will decrease Outcome: Progressing   Problem: Education: Goal: Ability to describe Setzler-care measures that may prevent or decrease complications (Diabetes Survival Skills Education) will improve Outcome: Progressing Goal: Individualized Educational Video(s) Outcome: Progressing   Problem: Coping: Goal: Ability to adjust to condition or change in health will improve Outcome: Progressing   Problem: Fluid  Volume: Goal: Ability to maintain a balanced intake and output will improve Outcome: Progressing   Problem: Health Behavior/Discharge Planning: Goal: Ability to identify and utilize available resources and services will improve Outcome: Progressing Goal: Ability to manage health-related needs will improve Outcome: Progressing   Problem: Metabolic: Goal: Ability to maintain appropriate glucose levels will improve Outcome: Progressing   Problem: Nutritional: Goal: Maintenance of adequate nutrition will improve Outcome: Progressing Goal: Progress toward achieving an optimal weight will improve Outcome: Progressing   Problem: Skin Integrity: Goal: Risk for impaired skin integrity will decrease Outcome: Progressing   Problem: Tissue Perfusion: Goal: Adequacy of tissue perfusion will improve Outcome: Progressing

## 2023-09-23 NOTE — Plan of Care (Signed)
 Patient was unable to sign the consent because he said that he doesn't have his reading glasses with him and it's at home. Patient verbalized that he will have procedure on Monday and he fully understood what they will do since he said he has done it multiple times in the past. He also said that he still has plenty of time to sign the consent.

## 2023-09-23 NOTE — Plan of Care (Signed)
   Problem: Education: Goal: Knowledge of General Education information will improve Description: Including pain rating scale, medication(s)/side effects and non-pharmacologic comfort measures Outcome: Not Progressing   Problem: Health Behavior/Discharge Planning: Goal: Ability to manage health-related needs will improve Outcome: Not Progressing   Problem: Activity: Goal: Risk for activity intolerance will decrease Outcome: Not Progressing   Problem: Coping: Goal: Level of anxiety will decrease Outcome: Not Progressing

## 2023-09-23 NOTE — Progress Notes (Signed)
 [facility]  PROGRESS NOTE  [name] M Hashemi Jr. FMW:[id number] DOB: [birth year] DOA: 09/22/2023 PCP: Shepard Ade, MD   Brief HPI:   78 y.o. 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   Per history obtained-apparently he developed an ulcer approximately a month ago-and has been on the observation by his podiatrist.  Approximately 2-3 days back-this ulcer started draining purulent material.  He was evaluated by his home health RN yesterday, and asked to go to podiatry office.  At the podiatry office-purulent discharge was confirmed-positive probe to the bone-cultures were obtained-x-ray apparently was consistent with osteomyelitis.  He was then referred to the hospitalist service as a direct admit.    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 Continue aspirin /statin Apparently has been holding Plavix  which will be held until he is postop.   History of CAD-s/p PCI 2006 No anginal symptoms Continue antiplatelet/statin/beta-blocker   History of CVA  with left-sided hemiparesis Continue antiplatelet/statin At baseline-minimally ambulatory with the help of a walker (to go to the bathroom) but mostly uses a wheelchair   History of COPD on home O2 Lungs clear As needed bronchodilators   History of multiple sclerosis At baseline Does not appear to be on any disease modifying agents.   CKD stage IIIa Await labs Avoid nephrotoxic agents.   History of chronic pain syndrome Continue Nucynta /as needed oxycodone .   History of peripheral neuropathy Likely related to diabetes Continue Cymbalta /narcotics.   BPH Finasteride    Anxiety/depression Cymbalta /trazodone .   Chronic debility/deconditioning Mostly wheelchair-bound but able to walk to the bathroom with a walker. PT/OT eval      Medications     amLODipine   5 mg Oral q morning   aspirin   81 mg Oral q AM   atorvastatin   10 mg Oral QHS   DULoxetine   60 mg Oral BID   enoxaparin  (LOVENOX ) injection  40 mg Subcutaneous Q24H   finasteride   5 mg Oral q AM   insulin  aspart  0-9 Units Subcutaneous TID WC   insulin  NPH Human  15 Units Subcutaneous BID AC & HS   metoprolol  tartrate  25 mg Oral BID   tapentadol   200 mg Oral Q12H   traZODone   150 mg Oral QHS     Data Reviewed:   CBG:  Recent Labs  Lab 09/22/23 1604 09/23/23 0437  GLUCAP 292* 145*    SpO2: 93 %    Vitals:   09/22/23 2109 09/22/23 2152 09/22/23 2237 09/23/23 0345  BP: 111/77 139/61 118/66 (!) 107/54  Pulse: 94 66 81 (!) 50  Resp: 16 16  16   Temp: 98.2 F (36.8 C) 98  F (36.7 C)  98.2 F (36.8 C)  TempSrc:    Oral  SpO2: 100% 97%  93%      Data Reviewed:  Basic Metabolic Panel: Recent Labs  Lab 09/22/23 1732  NA 134*  K 4.8  CL 101  CO2 26  GLUCOSE 285*  BUN 35*  CREATININE 1.85*  CALCIUM  8.6*    CBC: Recent Labs  Lab 09/22/23 1732 09/23/23 0725  WBC 7.0 6.1  HGB 12.7* 12.1*  HCT 37.4* 36.0*  MCV 100.0 99.7  PLT 53* 52*    LFT Recent Labs  Lab 09/22/23 1732   AST 17  ALT 14  ALKPHOS 213*  BILITOT 0.9  PROT 7.1  ALBUMIN  2.4*     Antibiotics: Anti-infectives (From admission, onward)    None        DVT prophylaxis: Lovenox   Code Status: DNR  Family Communication: No family at bedside   CONSULTS podiatry   Subjective   Denies pain.   Objective    Physical Examination:   General: Appears in no acute distress Cardiovascular: S1-S2, regular, no murmur auscultated Respiratory: Lungs clear to auscultation bilaterally Abdomen: Soft, nontender, no organomegaly Extremities: Left foot in dressing Neurologic: Alert, left hemiparesis   Status is: Inpatient:      Pressure Injury 06/21/23 Coccyx Posterior;Lower Stage 2 -  Partial thickness loss of dermis presenting as a shallow open injury with a red, pink wound bed without slough. (Active)  06/21/23 1501  Location: Coccyx  Location Orientation: Posterior;Lower  Staging: Stage 2 -  Partial thickness loss of dermis presenting as a shallow open injury with a red, pink wound bed without slough.  Wound Description (Comments):   DO NOT USE:  Present on Admission: Yes        [name]   [facility] If 7PM-7AM, please contact night-coverage at [email or web address], Office  [phone]   09/23/2023, 8:57 AM  LOS: 1 day

## 2023-09-23 NOTE — Consult Note (Signed)
 WOC Nurse Consult Note: Reason for Consult: sacral wound  Wound type: 1.  Deep tissue Pressure Injuries to B buttocks purple maroon discoloration  2.  Full thickness R 2nd, 3rd and 4th toes r/t PAD 100% eschar  Pressure Injury POA: Yes Measurement: see nursing flowsheet  Wound bed: as above  Drainage (amount, consistency, odor) dry  Periwound: patient appears to have widespread chronic tissue damage likely from constant pressure on buttocks  Dressing procedure/placement/frequency:  Cleanse buttocks with soap and water , dry and apply Gerhardt Butt Cream to sacrum/buttocks 2 times daily and prn soiling. Cover with silicone foam or ABD pad and tape whichever is preferred.  Paint R foot toe wounds with Betadine twice daily and leave open to air.   Patient would benefit from a low air loss mattress for pressure redistribution and moisture management. Also a pressure redistribution chair pad for when up Gerlean [phone]  POC discussed with bedside nurse. WOC team will not follow. Podiatry is following L toe wound with planned surgery.   Thank you,    Powell Bar MSN, RN-BC, [employer]

## 2023-09-23 NOTE — Consult Note (Addendum)
  Subjective:  Patient ID: [name]., male    DOB: 1946-01-03,  MRN: [id number]  A 78 y.o. 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  Patient has a history of multiple prior amputation as well.  He states it progressively got worse would like to discuss next treatment plan he is currently resting comfortably at bedside. Objective:   Vitals:   09/23/23 0345 09/23/23 0901  BP: (!) 107/54 (!) 148/63  Pulse: (!) 50 (!) 55  Resp: 16 18  Temp: 98.2 F (36.8 C) (!) 97.5 F (36.4 C)  SpO2: 93% 94%   General AA&O x3. Normal mood and affect.  Vascular  Foot faintly palpable pedal pulses.  Capillary refill intact to the digits.  Diminished pedal hair growth.  Left foot edema versus right with fourth toe edematous.  Left lower extremity warm to touch versus right.   Neurologic Epicritic sensation grossly intact.  Dermatologic Left fourth digit ulceration probing down to deep bone nonpurulent drainage noted stable.  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     Assessment & Plan:  Patient was evaluated and treated and all questions answered.  Left fourth digit ulcer with underlying osteomyelitis - All questions and concerns were discussed with the patient in extensive detail - MRI read is pending.   - However given that patient has had previous amputation on the left side given that his fourth digit is also likely needing amputation patient will benefit from transmetatarsal amputation I discussed this with the patient in extensive detail for now patient agrees with the plan would like to proceed with left foot transmetatarsal amputation - N.p.o. after midnight on Sunday - Will plan to  take patient to the OR on Monday morning. - Weightbearing as tolerated to the left side for now. - Patient will benefit from a new set of ABIs PVRs to compare to previous 1 to see if there is a decreasing flow.  Based off the new ABIs patient will benefit from vascular consult Franky SHAUNNA Blanch, DPM  Accessible via secure chat for questions or concerns.

## 2023-09-24 DIAGNOSIS — E108 Type 1 diabetes mellitus with unspecified complications: Secondary | ICD-10-CM | POA: Diagnosis not present

## 2023-09-24 DIAGNOSIS — G894 Chronic pain syndrome: Secondary | ICD-10-CM | POA: Diagnosis not present

## 2023-09-24 DIAGNOSIS — E7800 Pure hypercholesterolemia, unspecified: Secondary | ICD-10-CM | POA: Diagnosis not present

## 2023-09-24 DIAGNOSIS — I10 Essential (primary) hypertension: Secondary | ICD-10-CM | POA: Diagnosis not present

## 2023-09-24 LAB — GLUCOSE, CAPILLARY
Glucose-Capillary: 148 mg/dL — ABNORMAL HIGH (ref 70–99)
Glucose-Capillary: 257 mg/dL — ABNORMAL HIGH (ref 70–99)
Glucose-Capillary: 359 mg/dL — ABNORMAL HIGH (ref 70–99)
Glucose-Capillary: 263 mg/dL — ABNORMAL HIGH (ref 70–99)
Glucose-Capillary: 107 mg/dL — ABNORMAL HIGH (ref 70–99)
Glucose-Capillary: 258 mg/dL — ABNORMAL HIGH (ref 70–99)

## 2023-09-24 MED ORDER — VANCOMYCIN HCL 1750 MG/350ML IV SOLN
1750.0000 mg | Freq: Once | INTRAVENOUS | Status: AC
  Administered 2023-09-24: 1750 mg via INTRAVENOUS
  Filled 2023-09-24: qty 350

## 2023-09-24 MED ORDER — INSULIN GLARGINE-YFGN 100 UNIT/ML ~~LOC~~ SOPN: 20.0000 [IU] | PEN_INJECTOR | Freq: Every day | SUBCUTANEOUS | Status: DC

## 2023-09-24 MED ORDER — PIPERACILLIN-TAZOBACTAM 3.375 G IVPB: 3.3750 g | Freq: Once | INTRAVENOUS | Status: DC

## 2023-09-24 MED ORDER — ASPIRIN 81 MG PO CHEW
81.0000 mg | CHEWABLE_TABLET | Freq: Every morning | ORAL | Status: DC
  Administered 2023-09-25 – 2023-09-27 (×3): 81 mg via ORAL
  Filled 2023-09-24 (×3): qty 1

## 2023-09-24 MED ORDER — INSULIN GLARGINE 100 UNIT/ML ~~LOC~~ SOLN
20.0000 [IU] | Freq: Every day | SUBCUTANEOUS | Status: DC
  Administered 2023-09-24: 20 [IU] via SUBCUTANEOUS
  Filled 2023-09-24 (×2): qty 0.2

## 2023-09-24 MED ORDER — PIPERACILLIN-TAZOBACTAM 3.375 G IVPB
3.3750 g | Freq: Three times a day (TID) | INTRAVENOUS | Status: DC
  Administered 2023-09-24 – 2023-09-25 (×3): 3.375 g via INTRAVENOUS
  Filled 2023-09-24 (×3): qty 50

## 2023-09-24 MED ORDER — VANCOMYCIN HCL 1250 MG/250ML IV SOLN: 1250.0000 mg | INTRAVENOUS | Status: DC

## 2023-09-24 NOTE — Plan of Care (Addendum)
 Dressing completed.  Started on IV abx.  Pt OOB into chair via steady. Mattress switched to low air loss.  Prn Miralax  given, reports LBM prior to admission  Problem: Education: Goal: Knowledge of General Education information will improve Description: Including pain rating scale, medication(s)/side effects and non-pharmacologic comfort measures Outcome: Progressing   Problem: Health Behavior/Discharge Planning: Goal: Ability to manage health-related needs will improve Outcome: Progressing   Problem: Clinical Measurements: Goal: Ability to maintain clinical measurements within normal limits will improve Outcome: Progressing Goal: Will remain free from infection Outcome: Progressing Goal: Diagnostic test results will improve Outcome: Progressing Goal: Respiratory complications will improve Outcome: Progressing Goal: Cardiovascular complication will be avoided Outcome: Progressing

## 2023-09-24 NOTE — Progress Notes (Signed)
 Pharmacy Antibiotic Note  [name]. is a 78 y.o. male admitted on 09/22/2023 with osteomyelitis.  Pharmacy has been consulted for vancomycin  and zosyn  dosing.  Plan: Vancomycin  bolus 1750mg  IV x1 (21.4mg /kg), then Vancomycin  1250mg  IV every 24 hours.  Goal AUC 400-550. (Predicted AUC 505.8) (Scr 1.52, Vd 0.72) Zosyn  3.375g IV q8h (4 hour infusion).    Temp (24hrs), Avg:98.4 F (36.9 C), Min:97.8 F (36.6 C), Max:98.7 F (37.1 C)  Recent Labs  Lab 09/22/23 1732 09/23/23 0725  WBC 7.0 6.1  CREATININE 1.85* 1.52*    CrCl cannot be calculated (Unknown ideal weight.).    No Known Allergies  Antimicrobials this admission: Zosyn  8/17 >>  Vancomycin  8/17 >>   Microbiology results: No results in progress Follow-up after toe amputation 8/18  Thank you for allowing pharmacy to be involved with this patient's care.  Mendel Barter, PharmD PGY1 Clinical Pharmacist [facility]  09/24/2023 3:23 PM

## 2023-09-24 NOTE — Progress Notes (Signed)
 [facility]  PROGRESS NOTE  [name] M Maser Jr. FMW:[id number] DOB: 1945-11-14 DOA: 09/22/2023 PCP: Shepard Ade, MD   Brief HPI:   78 y.o. 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   Per history obtained-apparently he developed an ulcer approximately a month ago-and has been on the observation by his podiatrist.  Approximately 2-3 days back-this ulcer started draining purulent material.  He was evaluated by his home health RN yesterday, and asked to go to podiatry office.  At the podiatry office-purulent discharge was confirmed-positive probe to the bone-cultures were obtained-x-ray apparently was consistent with osteomyelitis.  He was then referred to the hospitalist service as a direct admit.    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 Continue aspirin /statin Apparently has been holding Plavix  which will be held until he is postop.   History of CAD-s/p PCI 2006 No anginal symptoms Continue antiplatelet/statin/beta-blocker   History of CVA with left-sided hemiparesis Continue antiplatelet/statin At baseline-minimally ambulatory with the help of a walker (to go to the bathroom) but mostly uses a wheelchair   History of COPD on home O2 Lungs clear As needed bronchodilators   History of multiple sclerosis At baseline Does not appear to be on any disease modifying agents.   CKD stage IIIa Await labs Avoid nephrotoxic agents.   History of chronic pain syndrome Continue Nucynta /as needed oxycodone .   History of peripheral neuropathy Likely related to diabetes Continue Cymbalta /narcotics.   BPH Finasteride    Anxiety/depression Cymbalta /trazodone .   Chronic debility/deconditioning Mostly wheelchair-bound but able to walk to the bathroom with a walker. PT/OT eval      Medications     amLODipine   5 mg Oral q morning   [START ON 09/25/2023] aspirin   81 mg Oral q AM   atorvastatin   10 mg Oral QHS   DULoxetine   60 mg Oral BID   enoxaparin  (LOVENOX ) injection  40 mg Subcutaneous Q24H   finasteride   5 mg Oral q AM   Gerhardt's butt cream   Topical BID   insulin  aspart  0-9 Units Subcutaneous TID WC   insulin  glargine  20 Units Subcutaneous Daily   metoprolol  tartrate  25 mg Oral BID   tapentadol   200 mg Oral Q12H   traZODone   150 mg Oral QHS     Data Reviewed:   CBG:  Recent Labs  Lab 09/24/23 0150 09/24/23 0522 09/24/23 9281  09/24/23 1147 09/24/23 1653  GLUCAP 148* 257* 359* 263* 107*    SpO2: 95 %    Vitals:   09/24/23 0523 09/24/23 0722 09/24/23 1516 09/24/23 1657  BP: (!) 103/50 (!) 140/63  (!) 142/57  Pulse: 70 65  (!) 53  Resp: 18 16  14   Temp: 98.7 F (37.1 C) 98.5 F (36.9 C)  98.1 F (36.7 C)  TempSrc: Oral Oral  Oral  SpO2: 93% 92%  95%  Weight:   81.6 kg        Data Reviewed:  Basic Metabolic Panel: Recent Labs  Lab 09/22/23 1732 09/23/23 0725  NA 134* 136  K 4.8 4.2  CL 101 101  CO2 26 30  GLUCOSE 285* 90  BUN 35* 30*  CREATININE 1.85* 1.52*  CALCIUM  8.6* 8.4*    CBC: Recent Labs  Lab 09/22/23 1732 09/23/23 0725  WBC 7.0 6.1  HGB 12.7* 12.1*  HCT 37.4* 36.0*  MCV 100.0 99.7  PLT 53* 52*    LFT Recent Labs  Lab 09/22/23 1732  AST 17  ALT 14  ALKPHOS 213*  BILITOT 0.9  PROT 7.1  ALBUMIN  2.4*     Antibiotics: Anti-infectives (From admission, onward)    Start     Dose/Rate Route Frequency Ordered Stop   09/25/23 1600  vancomycin  (VANCOREADY) IVPB 1250 mg/250 mL        1,250 mg 166.7 mL/hr over 90 Minutes Intravenous Every 24 hours 09/24/23 1528     09/24/23 1615  vancomycin  (VANCOREADY) IVPB 1750 mg/350 mL        1,750 mg 175 mL/hr over 120 Minutes Intravenous  Once 09/24/23 1528     09/24/23 1615  piperacillin -tazobactam (ZOSYN ) IVPB 3.375 g        3.375 g 12.5 mL/hr over 240 Minutes Intravenous Every 8 hours 09/24/23 1528     09/24/23 1515  piperacillin -tazobactam (ZOSYN ) IVPB 3.375 g  Status:  Discontinued        3.375 g 12.5 mL/hr over 240 Minutes Intravenous  Once 09/24/23 1422 09/24/23 1527        DVT prophylaxis: Lovenox   Code Status: DNR  Family Communication: No family at bedside   CONSULTS podiatry   Subjective   No new complaints.   Objective    Physical Examination:  General-appears in no acute distress Heart-S1-S2, regular, no murmur auscultated Lungs-clear to auscultation bilaterally, no wheezing or crackles auscultated Abdomen-soft, nontender, no organomegaly Extremities-left foot has mild erythema Neuro-alert, oriented x3, no focal deficit noted   Status is: Inpatient:      Pressure Injury 06/21/23 Coccyx Posterior;Lower Stage 2 -  Partial thickness loss of dermis presenting as a shallow open injury with a red, pink wound bed without slough. (Active)   06/21/23 1501  Location: Coccyx  Location Orientation: Posterior;Lower  Staging: Stage 2 -  Partial thickness loss of dermis presenting as a shallow open injury with a red, pink wound bed without slough.  Wound Description (Comments):   DO NOT USE:  Present on Admission: Yes        Shalanda Brogden S Arman Loy   [facility] If 7PM-7AM, please contact night-coverage at [email or web address], Office  [phone]   09/24/2023, 6:02 PM  LOS: 2 days

## 2023-09-24 NOTE — Plan of Care (Signed)
  Problem: Elimination: Goal: Will not experience complications related to bowel motility Outcome: Progressing   Problem: Coping: Goal: Level of anxiety will decrease Outcome: Progressing   Problem: Pain Managment: Goal: General experience of comfort will improve and/or be controlled Outcome: Progressing

## 2023-09-25 ENCOUNTER — Encounter (HOSPITAL_COMMUNITY)
Admission: RE | Disposition: A | Discharge status: Home Health | Payer: Self-pay | Source: Ambulatory Visit | Attending: Family Medicine

## 2023-09-25 ENCOUNTER — Other Ambulatory Visit: Payer: Self-pay

## 2023-09-25 ENCOUNTER — Inpatient Hospital Stay (HOSPITAL_COMMUNITY): Admitting: Anesthesiology

## 2023-09-25 ENCOUNTER — Inpatient Hospital Stay (HOSPITAL_COMMUNITY)

## 2023-09-25 ENCOUNTER — Encounter (HOSPITAL_COMMUNITY): Payer: Self-pay | Admitting: Internal Medicine

## 2023-09-25 DIAGNOSIS — I2510 Atherosclerotic heart disease of native coronary artery without angina pectoris: Secondary | ICD-10-CM

## 2023-09-25 DIAGNOSIS — E108 Type 1 diabetes mellitus with unspecified complications: Secondary | ICD-10-CM | POA: Diagnosis not present

## 2023-09-25 DIAGNOSIS — Z794 Long term (current) use of insulin: Secondary | ICD-10-CM | POA: Diagnosis not present

## 2023-09-25 DIAGNOSIS — E10628 Type 1 diabetes mellitus with other skin complications: Secondary | ICD-10-CM | POA: Diagnosis not present

## 2023-09-25 DIAGNOSIS — M86172 Other acute osteomyelitis, left ankle and foot: Secondary | ICD-10-CM | POA: Diagnosis not present

## 2023-09-25 DIAGNOSIS — M2061 Acquired deformities of toe(s), unspecified, right foot: Secondary | ICD-10-CM

## 2023-09-25 DIAGNOSIS — I10 Essential (primary) hypertension: Secondary | ICD-10-CM | POA: Diagnosis not present

## 2023-09-25 DIAGNOSIS — G894 Chronic pain syndrome: Secondary | ICD-10-CM | POA: Diagnosis not present

## 2023-09-25 DIAGNOSIS — E7800 Pure hypercholesterolemia, unspecified: Secondary | ICD-10-CM | POA: Diagnosis not present

## 2023-09-25 HISTORY — PX: AMPUTATION TOE: SHX6595

## 2023-09-25 HISTORY — PX: TRANSMETATARSAL AMPUTATION: SHX6197

## 2023-09-25 LAB — CBC
HCT: 38.3 % — ABNORMAL LOW (ref 39.0–52.0)
Hemoglobin: 13.1 g/dL (ref 13.0–17.0)
MCH: 33.8 pg (ref 26.0–34.0)
MCHC: 34.2 g/dL (ref 30.0–36.0)
MCV: 98.7 fL (ref 80.0–100.0)
Platelets: 49 K/uL — ABNORMAL LOW (ref 150–400)
RBC: 3.88 MIL/uL — ABNORMAL LOW (ref 4.22–5.81)
RDW: 12.8 % (ref 11.5–15.5)
WBC: 5.9 K/uL (ref 4.0–10.5)
nRBC: 0 % (ref 0.0–0.2)

## 2023-09-25 LAB — GLUCOSE, CAPILLARY
Glucose-Capillary: 234 mg/dL — ABNORMAL HIGH (ref 70–99)
Glucose-Capillary: 213 mg/dL — ABNORMAL HIGH (ref 70–99)
Glucose-Capillary: 141 mg/dL — ABNORMAL HIGH (ref 70–99)
Glucose-Capillary: 156 mg/dL — ABNORMAL HIGH (ref 70–99)
Glucose-Capillary: 188 mg/dL — ABNORMAL HIGH (ref 70–99)
Glucose-Capillary: 233 mg/dL — ABNORMAL HIGH (ref 70–99)
Glucose-Capillary: 223 mg/dL — ABNORMAL HIGH (ref 70–99)

## 2023-09-25 LAB — BASIC METABOLIC PANEL WITH GFR
Anion gap: 9 (ref 5–15)
BUN: 31 mg/dL — ABNORMAL HIGH (ref 8–23)
CO2: 27 mmol/L (ref 22–32)
Calcium: 8.2 mg/dL — ABNORMAL LOW (ref 8.9–10.3)
Chloride: 100 mmol/L (ref 98–111)
Creatinine, Ser: 1.55 mg/dL — ABNORMAL HIGH (ref 0.61–1.24)
GFR, Estimated: 46 mL/min — ABNORMAL LOW (ref >60)
Glucose, Bld: 271 mg/dL — ABNORMAL HIGH (ref 70–99)
Potassium: 4.6 mmol/L (ref 3.5–5.1)
Sodium: 136 mmol/L (ref 135–145)

## 2023-09-25 LAB — MRSA NEXT GEN BY PCR, NASAL: MRSA by PCR Next Gen: NOT DETECTED

## 2023-09-25 SURGERY — AMPUTATION, TOE
Anesthesia: Monitor Anesthesia Care | Site: Foot | Laterality: Right

## 2023-09-25 MED ORDER — PROPOFOL 1000 MG/100ML IV EMUL
INTRAVENOUS | Status: AC
  Filled 2023-09-25: qty 100

## 2023-09-25 MED ORDER — VASOPRESSIN 20 UNIT/ML IV SOLN
INTRAVENOUS | Status: DC | PRN
  Administered 2023-09-25: 2 [IU] via INTRAVENOUS

## 2023-09-25 MED ORDER — LIDOCAINE HCL 1 % IJ SOLN
INTRAMUSCULAR | Status: DC | PRN
  Administered 2023-09-25: 30 mL

## 2023-09-25 MED ORDER — LACTATED RINGERS IV SOLN: INTRAVENOUS | Status: DC

## 2023-09-25 MED ORDER — FENTANYL CITRATE (PF) 100 MCG/2ML IJ SOLN: 25.0000 ug | INTRAMUSCULAR | Status: DC | PRN

## 2023-09-25 MED ORDER — PROPOFOL 500 MG/50ML IV EMUL
INTRAVENOUS | Status: DC | PRN
  Administered 2023-09-25: 100 ug/kg/min via INTRAVENOUS

## 2023-09-25 MED ORDER — INSULIN GLARGINE 100 UNIT/ML ~~LOC~~ SOLN
25.0000 [IU] | Freq: Every day | SUBCUTANEOUS | Status: DC
  Administered 2023-09-25: 25 [IU] via SUBCUTANEOUS
  Filled 2023-09-25 (×3): qty 0.25

## 2023-09-25 MED ORDER — EPHEDRINE SULFATE-NACL 50-0.9 MG/10ML-% IV SOSY
PREFILLED_SYRINGE | INTRAVENOUS | Status: DC | PRN
  Administered 2023-09-25: 10 mg via INTRAVENOUS
  Administered 2023-09-25 (×2): 5 mg via INTRAVENOUS

## 2023-09-25 MED ORDER — LACTATED RINGERS IV SOLN: INTRAVENOUS | Status: DC | PRN

## 2023-09-25 MED ORDER — ORAL CARE MOUTH RINSE: 15.0000 mL | Freq: Once | OROMUCOSAL | Status: AC

## 2023-09-25 MED ORDER — FENTANYL CITRATE (PF) 100 MCG/2ML IJ SOLN
INTRAMUSCULAR | Status: AC
  Filled 2023-09-25: qty 2

## 2023-09-25 MED ORDER — INSULIN ASPART 100 UNIT/ML IJ SOLN
0.0000 [IU] | INTRAMUSCULAR | Status: DC | PRN
  Administered 2023-09-25: 2 [IU] via SUBCUTANEOUS

## 2023-09-25 MED ORDER — ACETAMINOPHEN 10 MG/ML IV SOLN: 1000.0000 mg | Freq: Once | INTRAVENOUS | Status: DC | PRN

## 2023-09-25 MED ORDER — AMOXICILLIN-POT CLAVULANATE 875-125 MG PO TABS
1.0000 | ORAL_TABLET | Freq: Two times a day (BID) | ORAL | Status: DC
  Administered 2023-09-25 – 2023-09-27 (×5): 1 via ORAL
  Filled 2023-09-25 (×5): qty 1

## 2023-09-25 MED ORDER — CHLORHEXIDINE GLUCONATE 0.12 % MT SOLN: 15.0000 mL | Freq: Once | OROMUCOSAL | Status: AC

## 2023-09-25 MED ORDER — CLOPIDOGREL BISULFATE 75 MG PO TABS
75.0000 mg | ORAL_TABLET | Freq: Every day | ORAL | Status: DC
  Administered 2023-09-25 – 2023-09-27 (×3): 75 mg via ORAL
  Filled 2023-09-25 (×3): qty 1

## 2023-09-25 MED ORDER — SODIUM CHLORIDE 0.9 % IR SOLN
Status: DC | PRN
  Administered 2023-09-25: 1000 mL

## 2023-09-25 MED ORDER — DROPERIDOL 2.5 MG/ML IJ SOLN: 0.6250 mg | Freq: Once | INTRAMUSCULAR | Status: DC | PRN

## 2023-09-25 MED ORDER — CHLORHEXIDINE GLUCONATE 0.12 % MT SOLN
OROMUCOSAL | Status: AC
  Administered 2023-09-25: 15 mL via OROMUCOSAL
  Filled 2023-09-25: qty 15

## 2023-09-25 MED ORDER — FENTANYL CITRATE (PF) 100 MCG/2ML IJ SOLN
INTRAMUSCULAR | Status: DC | PRN
  Administered 2023-09-25: 50 ug via INTRAVENOUS

## 2023-09-25 MED ORDER — VASOPRESSIN 20 UNIT/ML IV SOLN
INTRAVENOUS | Status: AC
Start: 2023-09-25 — End: 2023-09-25
  Filled 2023-09-25: qty 1

## 2023-09-25 MED ORDER — ALBUMIN HUMAN 5 % IV SOLN
INTRAVENOUS | Status: DC | PRN
Start: 2023-09-25 — End: 2023-09-25

## 2023-09-25 SURGICAL SUPPLY — 41 items
BLADE AVERAGE 25X9 (BLADE) IMPLANT
BLADE SURG 10 STRL SS (BLADE) ×2 IMPLANT
BLADE SURG 15 STRL LF DISP TIS (BLADE) ×2 IMPLANT
BNDG COHESIVE 3X5 TAN ST LF (GAUZE/BANDAGES/DRESSINGS) ×2 IMPLANT
BNDG COMPR ESMARK 4X3 LF (GAUZE/BANDAGES/DRESSINGS) ×2 IMPLANT
BNDG ELASTIC 3INX 5YD STR LF (GAUZE/BANDAGES/DRESSINGS) ×2 IMPLANT
BNDG ELASTIC 4INX 5YD STR LF (GAUZE/BANDAGES/DRESSINGS) IMPLANT
BNDG GAUZE DERMACEA FLUFF 4 (GAUZE/BANDAGES/DRESSINGS) IMPLANT
CHLORAPREP W/TINT 26 (MISCELLANEOUS) IMPLANT
CNTNR URN SCR LID CUP LEK RST (MISCELLANEOUS) IMPLANT
CONT SPECIMEN PATHPROOF (MISCELLANEOUS) IMPLANT
DRAPE BILATERAL LIMB T (DRAPES) IMPLANT
DRSG ADAPTIC 3X8 NADH LF (GAUZE/BANDAGES/DRESSINGS) IMPLANT
DRSG XEROFORM 1X8 (GAUZE/BANDAGES/DRESSINGS) IMPLANT
ELECTRODE REM PT RTRN 9FT ADLT (ELECTROSURGICAL) ×2 IMPLANT
GAUZE PAD ABD 8X10 STRL (GAUZE/BANDAGES/DRESSINGS) IMPLANT
GAUZE SPONGE 2X2 STRL 8-PLY (GAUZE/BANDAGES/DRESSINGS) IMPLANT
GAUZE SPONGE 4X4 12PLY STRL (GAUZE/BANDAGES/DRESSINGS) ×2 IMPLANT
GAUZE STRETCH 2X75IN STRL (MISCELLANEOUS) ×2 IMPLANT
GAUZE XEROFORM 1X8 LF (GAUZE/BANDAGES/DRESSINGS) ×2 IMPLANT
GLOVE BIO SURGEON STRL SZ7.5 (GLOVE) ×2 IMPLANT
GLOVE BIOGEL PI IND STRL 7.5 (GLOVE) ×2 IMPLANT
GOWN STRL REUS W/ TWL LRG LVL3 (GOWN DISPOSABLE) ×4 IMPLANT
KIT BASIN OR (CUSTOM PROCEDURE TRAY) ×2 IMPLANT
NDL HYPO 25X1 1.5 SAFETY (NEEDLE) ×2 IMPLANT
NEEDLE HYPO 25X1 1.5 SAFETY (NEEDLE) ×2 IMPLANT
PACK ORTHO EXTREMITY (CUSTOM PROCEDURE TRAY) ×2 IMPLANT
PADDING CAST ABS COTTON 4X4 ST (CAST SUPPLIES) ×4 IMPLANT
PENCIL BUTTON HOLSTER BLD 10FT (ELECTRODE) IMPLANT
SET HNDPC FAN SPRY TIP SCT (DISPOSABLE) IMPLANT
SPIKE FLUID TRANSFER (MISCELLANEOUS) IMPLANT
STAPLER SKIN PROX 35W (STAPLE) IMPLANT
STOCKINETTE 4X48 STRL (DRAPES) IMPLANT
SUT ETHILON 3 0 FSLX (SUTURE) IMPLANT
SUT PROLENE 3 0 PS 2 (SUTURE) IMPLANT
SUT PROLENE 4 0 PS 2 18 (SUTURE) IMPLANT
SYR CONTROL 10ML LL (SYRINGE) ×2 IMPLANT
TUBE CONNECTING 12X1/4 (SUCTIONS) IMPLANT
UNDERPAD 30X36 HEAVY ABSORB (UNDERPADS AND DIAPERS) ×2 IMPLANT
WATER STERILE IRR 1000ML POUR (IV SOLUTION) ×2 IMPLANT
YANKAUER SUCT BULB TIP NO VENT (SUCTIONS) IMPLANT

## 2023-09-25 NOTE — Anesthesia Postprocedure Evaluation (Signed)
 Anesthesia Post Note  Patient: [name].  Procedure(s) Performed: RIGHT SECOND TOE AMPUTATION (Right: Foot) AMPUTATION, FOOT, TRANSMETATARSAL (Left: Foot)     Patient location during evaluation: PACU Anesthesia Type: MAC Level of consciousness: awake and alert Pain management: pain level controlled Vital Signs Assessment: post-procedure vital signs reviewed and stable Respiratory status: spontaneous breathing, nonlabored ventilation, respiratory function stable and patient connected to nasal cannula oxygen  Cardiovascular status: stable and blood pressure returned to baseline Postop Assessment: no apparent nausea or vomiting Anesthetic complications: no   There were no known notable events for this encounter.  Last Vitals:  Vitals:   09/25/23 1108 09/25/23 1109  BP: (!) 124/48 (!) 124/48  Pulse: (!) 55   Resp:    Temp:    SpO2:      Last Pain:  Vitals:   09/25/23 1029  TempSrc:   PainSc: 7                  Satcha Storlie P Tariah Transue

## 2023-09-25 NOTE — Progress Notes (Signed)
 Pt returned from surgery and vitals taken below. Notified Dr. Sabas Brod about pt's BP and pulse. Per MD hold amlodipine  and metoprolol .   09/25/23 1014  Vitals  Temp 97.9 F (36.6 C)  Temp Source Oral  BP (!) 124/48  MAP (mmHg) 71  BP Method Automatic  Pulse Rate (!) 55  Pulse Rate Source Monitor  Resp 17  MEWS COLOR  MEWS Score Color Green  Oxygen  Therapy  SpO2 93 %  MEWS Score  MEWS Temp 0  MEWS Systolic 0  MEWS Pulse 0  MEWS RR 0  MEWS LOC 0  MEWS Score 0

## 2023-09-25 NOTE — Progress Notes (Signed)
 Patient stated that he had a skin tear on his right arm that happen while he was adjusting in the bed. The wound was cleansed with normal saline and foam dressing applied to affected area. Updated pt's LDA to document new abrasion. Notified CN Kate Ada, RN.

## 2023-09-25 NOTE — Anesthesia Preprocedure Evaluation (Addendum)
 Anesthesia Evaluation  Patient identified by MRN, date of birth, ID band Patient awake    Reviewed: Allergy & Precautions, NPO status , [name] Chart, lab work & pertinent test results  History of Anesthesia Complications (+) PONV and history of anesthetic complications  Airway Mallampati: II  TM Distance: >3 FB Neck ROM: Full    Dental  (+) Edentulous Upper, Edentulous Lower   Pulmonary former smoker   Pulmonary exam normal        Cardiovascular hypertension, Pt. on medications and Pt. on home beta blockers + CAD, + Past MI (2005) and + Cardiac Stents   Rhythm:Regular Rate:Normal     Neuro/Psych    Depression     Neuromuscular disease (MS) CVA, Residual Symptoms    GI/Hepatic negative GI ROS, Neg liver ROS,,,  Endo/Other  diabetes, Type 1, Insulin  Dependent    Renal/GU CRFRenal disease  negative genitourinary   Musculoskeletal  (+) Arthritis ,  Toe osteomyelitis    Abdominal Normal abdominal exam  (+)   Peds  Hematology Lab Results      Component                Value               Date                      WBC                      5.9                 09/25/2023                HGB                      13.1                09/25/2023                HCT                      38.3 (L)            09/25/2023                MCV                      98.7                09/25/2023                PLT                      49 (L)              09/25/2023              Anesthesia Other Findings   Reproductive/Obstetrics                              Anesthesia Physical Anesthesia Plan  ASA: 3  Anesthesia Plan: MAC   Post-op Pain Management:    Induction: Intravenous  PONV Risk Score and Plan: 2 and Ondansetron , Dexamethasone , Propofol  infusion and Treatment may vary due to age or medical condition  Airway Management Planned: Simple Face Mask and Nasal Cannula  Additional Equipment:  None  Intra-op Plan:  Post-operative Plan:   Informed Consent: I have reviewed the patients History and Physical, chart, labs and discussed the procedure including the risks, benefits and alternatives for the proposed anesthesia with the patient or authorized representative who has indicated his/her understanding and acceptance.     Dental advisory given  Plan Discussed with: CRNA  Anesthesia Plan Comments:          Anesthesia Quick Evaluation

## 2023-09-25 NOTE — Progress Notes (Signed)
 Pt enroute to OR via bed. Called report to [employer].

## 2023-09-25 NOTE — Plan of Care (Signed)
   Problem: Elimination: Goal: Will not experience complications related to bowel motility Outcome: Progressing Goal: Will not experience complications related to urinary retention Outcome: Progressing   Problem: Pain Managment: Goal: General experience of comfort will improve and/or be controlled Outcome: Progressing   Problem: Safety: Goal: Ability to remain free from injury will improve Outcome: Progressing

## 2023-09-25 NOTE — Progress Notes (Signed)
 [facility]  PROGRESS NOTE  [name] M Derosa Jr. FMW:[id number] DOB: 1945/10/11 DOA: 09/22/2023 PCP: Shepard Ade, MD   Brief HPI:   78 y.o. 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   Per history obtained-apparently he developed an ulcer approximately a month ago-and has been on the observation by his podiatrist.  Approximately 2-3 days back-this ulcer started draining purulent material.  He was evaluated by his home health RN yesterday, and asked to go to podiatry office.  At the podiatry office-purulent discharge was confirmed-positive probe to the bone-cultures were obtained-x-ray apparently was consistent with osteomyelitis.  He was then referred to the hospitalist service as a direct admit.    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 Continue aspirin /statin Apparently has  been holding Plavix  which will be held until he is postop. -Will discuss with podiatry regarding restarting Plavix  -ABIs ordered, currently pending   History of CAD-s/p PCI 2006 No anginal symptoms Continue antiplatelet/statin/beta-blocker   History of CVA with left-sided hemiparesis Continue antiplatelet/statin At baseline-minimally ambulatory with the help of a walker (to go to the bathroom) but mostly uses a wheelchair   History of COPD on home O2 Lungs clear As needed bronchodilators   History of multiple sclerosis At baseline Does not appear to be on any disease modifying agents.   CKD stage IIIa Await labs Avoid nephrotoxic agents.   History of chronic pain syndrome Continue Nucynta /as needed oxycodone .   History of peripheral neuropathy Likely related to diabetes Continue Cymbalta /narcotics.   BPH Finasteride    Anxiety/depression Cymbalta /trazodone .   Chronic debility/deconditioning Mostly wheelchair-bound but able to walk to the bathroom with a walker. PT/OT eval      Medications     [MAR Hold] amLODipine   5 mg Oral q morning   [MAR Hold] aspirin   81 mg Oral q AM   [MAR Hold] atorvastatin   10 mg Oral QHS   [MAR Hold] DULoxetine   60 mg Oral BID   [MAR Hold] enoxaparin  (LOVENOX ) injection  40 mg Subcutaneous Q24H   [MAR Hold] finasteride   5 mg Oral q AM   [MAR Hold] Gerhardt's butt cream   Topical BID   [MAR Hold] insulin  aspart  0-9 Units Subcutaneous TID WC   [MAR Hold] insulin  glargine  20 Units Subcutaneous Daily   [MAR Hold] metoprolol  tartrate  25 mg Oral BID   [MAR Hold] tapentadol   200 mg Oral Q12H   [MAR Hold] traZODone   150  mg Oral QHS     Data Reviewed:   CBG:  Recent Labs  Lab 09/24/23 1147 09/24/23 1653 09/24/23 2051 09/25/23 0624 09/25/23 0732  GLUCAP 263* 107* 258* 234* 213*    SpO2: (P) 94 %    Vitals:   09/24/23 2049 09/24/23 2101 09/25/23 0418 09/25/23 0713  BP: (!) 129/50  (!) 159/64 (P) 137/73  Pulse: 62  (!)  58 (!) (P) 56  Resp: 16  17 (P) 16  Temp: 98.1 F (36.7 C)  98.3 F (36.8 C) (P) 98.3 F (36.8 C)  TempSrc: Oral  Oral (P) Oral  SpO2: 97% 100% 97% (P) 94%  Weight:    (P) 81.6 kg  Height:    (P) 6' (1.829 m)      Data Reviewed:  Basic Metabolic Panel: Recent Labs  Lab 09/22/23 1732 09/23/23 0725 09/25/23 0436  NA 134* 136 136  K 4.8 4.2 4.6  CL 101 101 100  CO2 26 30 27   GLUCOSE 285* 90 271*  BUN 35* 30* 31*  CREATININE 1.85* 1.52* 1.55*  CALCIUM  8.6* 8.4* 8.2*    CBC: Recent Labs  Lab 09/22/23 1732 09/23/23 0725 09/25/23 0436  WBC 7.0 6.1 5.9  HGB 12.7* 12.1* 13.1  HCT 37.4* 36.0* 38.3*  MCV 100.0 99.7 98.7  PLT 53* 52* 49*    LFT Recent Labs  Lab 09/22/23 1732  AST 17  ALT 14  ALKPHOS 213*  BILITOT 0.9  PROT 7.1  ALBUMIN  2.4*     Antibiotics: Anti-infectives (From admission, onward)    Start     Dose/Rate Route Frequency Ordered Stop   09/25/23 1600  [MAR Hold]  vancomycin  (VANCOREADY) IVPB 1250 mg/250 mL        (MAR Hold since Mon 09/25/2023 at 0705.Hold Reason: Transfer to a Procedural area)   1,250 mg 166.7 mL/hr over 90 Minutes Intravenous Every 24 hours 09/24/23 1528     09/24/23 1615  vancomycin  (VANCOREADY) IVPB 1750 mg/350 mL        1,750 mg 175 mL/hr over 120 Minutes Intravenous  Once 09/24/23 1528 09/24/23 1831   09/24/23 1615  [MAR Hold]  piperacillin -tazobactam (ZOSYN ) IVPB 3.375 g        (MAR Hold since Mon 09/25/2023 at 0705.Hold Reason: Transfer to a Procedural area)   3.375 g 12.5 mL/hr over 240 Minutes Intravenous Every 8 hours 09/24/23 1528     09/24/23 1515  piperacillin -tazobactam (ZOSYN ) IVPB 3.375 g  Status:  Discontinued        3.375 g 12.5 mL/hr over 240 Minutes Intravenous  Once 09/24/23 1422 09/24/23 1527        DVT prophylaxis: Lovenox   Code Status: DNR  Family Communication: No family at bedside   CONSULTS podiatry   Subjective   Patient seen after surgery   Objective    Physical  Examination:  General-appears in no acute distress Heart-S1-S2, regular, no murmur auscultated Lungs-clear to auscultation bilaterally, no wheezing or crackles auscultated Abdomen-soft, nontender, no organomegaly Extremities-left foot in dressing Neuro-alert, oriented x3, no focal deficit noted   Status is: Inpatient:      Pressure Injury 06/21/23 Coccyx Posterior;Lower Stage 2 -  Partial thickness loss of dermis presenting as a shallow open injury with a red, pink wound bed without slough. (Active)  06/21/23 1501  Location: Coccyx  Location Orientation: Posterior;Lower  Staging: Stage 2 -  Partial thickness loss of dermis presenting as a shallow open injury with a red, pink wound bed without slough.  Wound  Description (Comments):   DO NOT USE:  Present on Admission: Yes        Clover Feehan S Didi Ganaway   [facility] If 7PM-7AM, please contact night-coverage at [email or web address], Office  [phone]   09/25/2023, 9:13 AM  LOS: 3 days

## 2023-09-25 NOTE — Progress Notes (Signed)
 Orthopedic Tech Progress Note Patient Details:  [name] Cothran Chance Jr. 12/22/1945 [id number]  Ortho Devices Type of Ortho Device: Postop shoe/boot Ortho Device/Splint Location: LLE Ortho Device/Splint Interventions: Ordered   Post Interventions Patient Tolerated: Well Instructions Provided: Care of device The patient declined the post op shoe for the right foot because he already has so many shoes at home,but accepted one for the left foot.  [place] Shawgo L Rakia Frayne 09/25/2023, 11:57 AM

## 2023-09-25 NOTE — Transfer of Care (Signed)
 Immediate Anesthesia Transfer of Care Note  Patient: [name] Danzer Feild Jr.  Procedure(s) Performed: RIGHT SECOND TOE AMPUTATION (Right: Foot) AMPUTATION, FOOT, TRANSMETATARSAL (Left: Foot)  Patient Location: PACU  Anesthesia Type:MAC  Level of Consciousness: awake, alert , and oriented  Airway & Oxygen  Therapy: Patient Spontanous Breathing  Post-op Assessment: Report given to RN and Post -op Vital signs reviewed and stable  Post vital signs: Reviewed and stable  Last Vitals:  Vitals Value Taken Time  BP 141/64 09/25/23 0932  Temp 97.7 09/25/23 0932  Pulse 56 09/25/23 09:31  Resp 20 09/25/23 09:31  SpO2 91 % 09/25/23 09:31  Vitals shown include unfiled device data.  Pt denies pain   Patients Stated Pain Goal: 2 (09/25/23 0307)  Complications: No notable events documented.

## 2023-09-25 NOTE — Op Note (Signed)
 Full Operative Report  Date of Operation: 9:42 AM, 09/25/2023   Patient: [name]. - 78 y.o. 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 Transmetatarsal amputation of LEFT foot 2. Amputation of 2nd toe at MPJ level, RIGHT foot    Anesthesia: Monitor Anesthesia Care  No responsible provider has been recorded for the case.  Anesthesiologist: Dorethea Cordella SQUIBB, DO CRNA: Oley Aleck LABOR, CRNA; Mannie Krystal LABOR, CRNA   Estimated Blood Loss: 200 mL  Hemostasis: 1) Anatomical dissection, mechanical compression, electrocautery 2) No tourniquet was used during the procedure  Implants: * No implants in log *  Materials: prolene 3-0, prolene 2-0, skin staples  Injectables: 1) Pre-operatively: 20 cc of 50:50 mixture 1%lidocaine  plain and 0.5% marcaine  plain left foot, 10 cc of same right foot 2) Post-operatively: None   Specimens: - Pathology: Right 2nd toe for path, Left forefoot for path - Microbiology: Bone culture L 4th toe for culture    Antibiotics: IV antibiotics given per schedule on the floor  Drains: None  Complications: Patient tolerated the procedure well without complication.   Operative findings: As below in detailed report  Indications for Procedure: [name] M Birkland Jr. presents to Malvin Marsa FALCON, DPM with a chief complaint of draining wound on the left fourth toe with evidence of osteomyelitis of the toe, history 1-3rd toe amptuation in past. Also with Right foot 2nd toe deformity with loss of stability and function, flexion toe deformity.  The patient has failed conservative treatments of various modalities. At this time the patient has elected to proceed with surgical correction. All alternatives, risks, and complications of the procedures were thoroughly explained to the patient. Patient exhibits appropriate  understanding of all discussion points and informed consent was signed and obtained in the chart with no guarantees to surgical outcome given or implied.  Description of Procedure: Patient was brought to the operating room. Patient remained on their hospital bed in the supine position. A surgical timeout was performed and all members of the operating room, the procedure, and the surgical site were identified. anesthesia occurred as per anesthesia record. Local anesthetic as previously described was then injected about the operative field in a local infiltrative block.  The operative lower extremity as noted above was then prepped and draped in the usual sterile manner. The following procedure then began.  Attention was directed to the 2nd digit on the RIGHT foot. A full-thickness incision encompassing the entire digit was made using a #15 blade. Dissection was carried down to bone. The toe was secured with a towel clamp, further dissected in its entirety, and disarticulated at the MPJ and passed to the back table as a gross specimen. This was then labled and sent to pathology.  All remaining necrotic and devitalized soft tissue structures were visualized and dissected away using sharp and dull dissection. Care was taken to protect all neurovascular structures throughout the dissection. All bleeders were cauterized as necessary. The area was then flushed with copious amounts of sterile saline. Then using the suture materials previously described, the site was closed in anatomic layers and the skin was well approximated under minimal tension.  Attention was directed to the LEFT lower extremity. A fish-mouth type incision was made proximal to the web spaces and encompassed the entire forefoot. The full-thickness incision was made with a longer plantar flap to allow for wound closure.  The incision was continued through the soft tissue down to the shafts of the metatarsal bones. A 15 blade was then used to free up  the periosteum on all the metatarsal shafts. Using an oscillating saw, the metatarsals were cut in a dorsal distal to plantar proximal orientation. The first metatarsal was beveled so that the medial cortex was shorter than the lateral, and the fifth metatarsal was beveled so that the lateral cortex was shorter than the medial, thus less prominent. The amputation was done so that a metatarsal parabola was maintained.  The distal portion including all the digits were freed from the metatarsals and soft tissue attachments. The specimen was passed off the field and sent for gross pathology. All remaining non-viable and necrotic tissues were sharply resected and removed. Extensor and flexors tendons were grasped with a hemostat and cut proximally. Very good bleeding was seen throughout the surgery. The surgical site was then flushed with 1000ml of saline. The plantar flap was brought in approximation with the dorsal flap and the sutures material previously described was used for closure. Care was taken not to place the flaps under tension in order not to jeopardize the vascular supply.  The surgical site was then dressed with xeroform, 4x4 gauze, kerlix and ace on right and same with addition of ABD pad on left foot. The patient tolerated both the procedure and anesthesia well with vital signs stable throughout. The patient was transferred in good condition and all vital signs stable  from the OR to recovery under the discretion of anesthesia.  Condition: Vital signs stable, neurovascular status unchanged from preoperative   Surgical plan:  Good bleeding seen bilaterally. Clean margin thought to be obtained. NWB to LLE on left foot - may need SNF for this - PT eval tmrw. Recommend 5 days augmentin  from surgery. Leave dressings C/D/I bilaterally for now, elevate LLE.  The patient will be NWB in a post op shoe to LLE (for protection) and WBAT in post op shoe on right until further instructed. The dressing is to  remain clean, dry, and intact. Will continue to follow unless noted elsewhere.   Marsa Honour, DPM [facility]

## 2023-09-25 NOTE — Progress Notes (Addendum)
 History and Physical Interval Note:  09/25/2023 7:06 AM  Zakariya M Schaberg Jr.  has presented today for surgery, with the diagnosis of osteomyelitis of left fourth toe, has prior 1-3rd toe amputations. He also reports issues with the right 2nd toe which is non functional and hanging down, it gets caught under his foot when walking - he is asking that we address this toe at this time with amputation as it has been bothering him for some time and concerned it will continue to be an issue.   The various methods of treatment have been discussed with the patient and family. After consideration of risks, benefits and other options for treatment, the patient has consented to   Procedure(s) with comments: AMPUTATION, TOE (Left) - Left 4th Toe vs. Transmetatarsal as well as RIGHT 2nd TOE AMPUTATION  as a surgical intervention.  The patient's history has been reviewed, patient examined, no change in status, stable for surgery.  I have reviewed the patient's chart and labs.  Questions were answered to the patient's satisfaction.     Marsa FALCON Rimas Gilham

## 2023-09-25 NOTE — Inpatient Diabetes Management (Signed)
 Inpatient Diabetes Program Recommendations  AACE/ADA: New Consensus Statement on Inpatient Glycemic Control (2015)  Target Ranges:  Prepandial:   less than 140 mg/dL      Peak postprandial:   less than 180 mg/dL (1-2 hours)      Critically ill patients:  140 - 180 mg/dL   Lab Results  Component Value Date   GLUCAP 156 (H) 09/25/2023   HGBA1C 6.4 (H) 06/20/2023    Review of Glycemic Control  Latest Reference Range & Units 09/24/23 07:18 09/24/23 11:47 09/24/23 16:53 09/24/23 20:51 09/25/23 06:24 09/25/23 07:32 09/25/23 09:32  Glucose-Capillary 70 - 99 mg/dL 640 (H) 736 (H) 892 (H) 258 (H) 234 (H) 213 (H) 156 (H)   Diabetes history: DM 2 Outpatient Diabetes medications: NPH 20 units qam, 10 units qpm, Humalog 0-15 units tid Current orders for Inpatient glycemic control:  Lantus  20 units Daily Novolog  0-9 units tid  Fasting glucose 213 this am. NPO for surgery  Inpatient Diabetes Program Recommendations:    -   Increase Semglee  to 25 units  Thanks,  Clotilda Bull RN, MSN, BC-ADM Inpatient Diabetes Coordinator Team Pager [phone] (8a-5p)

## 2023-09-25 NOTE — Plan of Care (Signed)
  Problem: Coping: Goal: Level of anxiety will decrease Outcome: Progressing   Problem: Activity: Goal: Risk for activity intolerance will decrease Outcome: Progressing   Problem: Safety: Goal: Ability to remain free from injury will improve Outcome: Progressing   

## 2023-09-26 ENCOUNTER — Encounter (HOSPITAL_COMMUNITY): Payer: Self-pay | Admitting: Podiatry

## 2023-09-26 DIAGNOSIS — G894 Chronic pain syndrome: Secondary | ICD-10-CM | POA: Diagnosis not present

## 2023-09-26 DIAGNOSIS — E7800 Pure hypercholesterolemia, unspecified: Secondary | ICD-10-CM | POA: Diagnosis not present

## 2023-09-26 DIAGNOSIS — E108 Type 1 diabetes mellitus with unspecified complications: Secondary | ICD-10-CM | POA: Diagnosis not present

## 2023-09-26 DIAGNOSIS — I10 Essential (primary) hypertension: Secondary | ICD-10-CM | POA: Diagnosis not present

## 2023-09-26 LAB — SURGICAL PATHOLOGY

## 2023-09-26 LAB — WOUND CULTURE
MICRO NUMBER:: 16838233
SPECIMEN QUALITY:: ADEQUATE

## 2023-09-26 LAB — GLUCOSE, CAPILLARY
Glucose-Capillary: 131 mg/dL — ABNORMAL HIGH (ref 70–99)
Glucose-Capillary: 162 mg/dL — ABNORMAL HIGH (ref 70–99)
Glucose-Capillary: 202 mg/dL — ABNORMAL HIGH (ref 70–99)
Glucose-Capillary: 108 mg/dL — ABNORMAL HIGH (ref 70–99)

## 2023-09-26 NOTE — Progress Notes (Signed)
 [facility]  PROGRESS NOTE  [name] M Traynor Jr. FMW:[id number] DOB: 1945-04-01 DOA: 09/22/2023 PCP: Shepard Ade, MD   Brief HPI:   78 y.o. 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   Per history obtained-apparently he developed an ulcer approximately a month ago-and has been on the observation by his podiatrist.  Approximately 2-3 days back-this ulcer started draining purulent material.  He was evaluated by his home health RN yesterday, and asked to go to podiatry office.  At the podiatry office-purulent discharge was confirmed-positive probe to the bone-cultures were obtained-x-ray apparently was consistent with osteomyelitis.  He was then referred to the hospitalist service as a direct admit.    Assessment/Plan:   Left fourth toe ulceration with acute osteomyelitis -Status post transmetatarsal amputation of left foot, amputation of second toe at MPJ level, right foot -ABI ordered, currently pending -MRI of left foot done, showed possible osteomyelitis of fourth toe -Patient was started on vancomycin  and Flagyl  yesterday -CRP 12.3, sed rate 73 - Podiatry following - As per podiatry, recommend Augmentin  for 5 days, plan for bilateral foot dressing change tomorrow and then leave intact until follow-up with podiatry as outpatient.       DM-1 Longstanding history of DM-1-diagnosed at the age of 67 Manages himself with NPH-anywhere from 15-20 units twice daily and sliding scale Currently on  NPH 15 units twice daily, along with SSI - Blood glucose has been labile in the hospital - Discontinued NPH and started Lantus  20 units subcu daily -CBG in 200s - CBG improved after changing Lantus  to 25 units subcu daily     HTN Continue amlodipine  and  metoprolol  Blood pressure well-controlled   HLD Statin   PAD Numerous amputations-right first great toe, left great toe, 1st and 2nd toe. Continue aspirin /statin Apparently has been holding Plavix  which will be held until he is postop. -Plavix  restarted after discussion with podiatry -ABIs ordered, currently pending   History of CAD-s/p PCI 2006 No anginal symptoms Continue antiplatelet/statin/beta-blocker   History of CVA with left-sided hemiparesis Continue antiplatelet/statin At baseline-minimally ambulatory with the help of a walker (to go to the bathroom) but mostly uses a wheelchair   History of COPD on home O2 Lungs clear As needed bronchodilators   History of multiple sclerosis At baseline Does not appear to be on any disease modifying agents.   CKD stage IIIa Await labs Avoid nephrotoxic agents.   History of chronic pain syndrome Continue Nucynta /as needed oxycodone .   History of peripheral neuropathy Likely related to diabetes Continue Cymbalta /narcotics.   BPH Finasteride    Anxiety/depression Cymbalta /trazodone .   Chronic debility/deconditioning Mostly wheelchair-bound but able to walk to the bathroom with a walker. PT/OT eval      Medications     amLODipine   5 mg Oral q morning   amoxicillin -clavulanate  1 tablet Oral Q12H   aspirin   81 mg Oral q AM   atorvastatin   10 mg Oral QHS   clopidogrel   75 mg Oral Daily   DULoxetine   60 mg Oral BID   enoxaparin  (LOVENOX ) injection  40 mg Subcutaneous Q24H   finasteride   5 mg Oral q AM   Gerhardt's butt cream   Topical BID   insulin  aspart  0-9 Units Subcutaneous TID WC   insulin  glargine  25 Units Subcutaneous Daily   metoprolol   tartrate  25 mg Oral BID   tapentadol   200 mg Oral Q12H   traZODone   150 mg Oral QHS     Data Reviewed:   CBG:  Recent Labs  Lab 09/25/23 1016 09/25/23 1551 09/25/23 1857 09/25/23 2018 09/26/23 0817  GLUCAP 141* 188* 233* 223* 131*    SpO2: 94 %     Vitals:   09/25/23 2034 09/26/23 0100 09/26/23 0450 09/26/23 0819  BP: (!) 139/58 (!) 143/61 (!) 124/110 (!) 144/70  Pulse: 79 64 63 (!) 59  Resp: 18 16  16   Temp: 100 F (37.8 C)  98.1 F (36.7 C) 98.1 F (36.7 C)  TempSrc: Oral  Oral Oral  SpO2: 95% 95% 99% 94%  Weight:      Height:          Data Reviewed:  Basic Metabolic Panel: Recent Labs  Lab 09/22/23 1732 09/23/23 0725 09/25/23 0436  NA 134* 136 136  K 4.8 4.2 4.6  CL 101 101 100  CO2 26 30 27   GLUCOSE 285* 90 271*  BUN 35* 30* 31*  CREATININE 1.85* 1.52* 1.55*  CALCIUM  8.6* 8.4* 8.2*    CBC: Recent Labs  Lab 09/22/23 1732 09/23/23 0725 09/25/23 0436  WBC 7.0 6.1 5.9  HGB 12.7* 12.1* 13.1  HCT 37.4* 36.0* 38.3*  MCV 100.0 99.7 98.7  PLT 53* 52* 49*    LFT Recent Labs  Lab 09/22/23 1732  AST 17  ALT 14  ALKPHOS 213*  BILITOT 0.9  PROT 7.1  ALBUMIN  2.4*     Antibiotics: Anti-infectives (From admission, onward)    Start     Dose/Rate Route Frequency Ordered Stop   09/25/23 1600  vancomycin  (VANCOREADY) IVPB 1250 mg/250 mL  Status:  Discontinued        1,250 mg 166.7 mL/hr over 90 Minutes Intravenous Every 24 hours 09/24/23 1528 09/25/23 1009   09/25/23 1000  amoxicillin -clavulanate (AUGMENTIN ) 875-125 MG per tablet 1 tablet        1 tablet Oral Every 12 hours 09/25/23 0948 09/30/23 0959   09/24/23 1615  vancomycin  (VANCOREADY) IVPB 1750 mg/350 mL        1,750 mg 175 mL/hr over 120 Minutes Intravenous  Once 09/24/23 1528 09/24/23 1831   09/24/23 1615  piperacillin -tazobactam (ZOSYN ) IVPB 3.375 g  Status:  Discontinued        3.375 g 12.5 mL/hr over 240 Minutes Intravenous Every 8 hours 09/24/23 1528 09/25/23 0948   09/24/23 1515  piperacillin -tazobactam (ZOSYN ) IVPB 3.375 g  Status:  Discontinued        3.375 g 12.5 mL/hr over 240 Minutes Intravenous  Once 09/24/23 1422 09/24/23 1527        DVT prophylaxis: Lovenox   Code Status: DNR  Family Communication: No family at  bedside   CONSULTS podiatry   Subjective   Status post transmetatarsal amputation of left foot, amputation of second toe at MPJ level right foot.  Pain well-controlled.   Objective    Physical Examination:  Appears in no acute distress S1-S2, regular, no murmur auscultated Lungs clear to auscultation bilaterally Abdomen is soft, nontender, no organomegaly Extremities-left foot in dressing   Status is: Inpatient:      Pressure Injury 06/21/23 Coccyx Posterior;Lower Stage 2 -  Partial thickness loss of dermis presenting as a shallow open injury with a red, pink wound bed without slough. (Active)  06/21/23 1501  Location: Coccyx  Location Orientation: Posterior;Lower  Staging: Stage 2 -  Partial thickness loss of dermis  presenting as a shallow open injury with a red, pink wound bed without slough.  Wound Description (Comments):   DO NOT USE:  Present on Admission: Yes        [name]   [facility] If 7PM-7AM, please contact night-coverage at [email or web address], Office  [phone]   09/26/2023, 9:01 AM  LOS: 4 days

## 2023-09-26 NOTE — Progress Notes (Signed)
  Subjective:  Patient ID: [name] CHRISTELLA Arch Mickey., male    DOB: Dec 22, 1945,  MRN: [id number]  No chief complaint on file.   DOS: 09/25/23 Procedure: 1. Transmetatarsal amputation of LEFT foot 2. Amputation of 2nd toe at MPJ level, RIGHT foot  78 y.o. male seen for post op check. He reports pain in left foot that is sharp. Aware of need to stay off left foot for a few weeks to allow healing. We discussed findings from surgery and follow up plans.   Review of Systems: Negative except as noted in the HPI. Denies N/V/F/Ch.   Objective:   Constitutional Well developed. Well nourished.  Vascular Foot warm and well perfused. Capillary refill normal to all digits.   No calf pain with palpation  Neurologic Normal speech. Oriented to person, place, and time. Epicritic sensation intact  Dermatologic Dressings bilaterally C/D/I   Orthopedic: S/p L foot TMA, R foot 2nd toe amp at 2nd proximal phalanx base level   Radiographs: XR R foot:  Interval amputation of the second toe at the base of the proximal phalanx. XR L foot: Interval transmetatarsal amputation of all 5 rays.   Pathology: Pending  Micro: L 4th toe bone cx: few GPCs, pending  Assessment:   No diagnosis found. Osteomyelitis L fourth toe, non functional right 2nd toe with flexion deformity S/p R 2nd toe amputation, Left foot transmetatarsal amputation  Plan:  Patient was evaluated and treated and all questions answered.  POD # 1 s/p L foot TMA, R 2nd toe amputation  -Progressing as expected post op, needs further pain control, PT today to assess NWB LLE -XR: expected post op changes -WB Status: WBAT in post op shoe to right foot, NWB to L foot in post op shoe for protection -Sutures: remain intact. -Medications/ABX: Recommend 5 days PO Augmentin   -Dressing: Plan for bilateral foot dressing change tomorrow, then likely leave intact until follow up  - F/u Plan: Follow up tmrw for dressing change, then will follow up with me  next week Tuesday office will call pt to arrange.         [name], DPM [facility] / [facility]

## 2023-09-26 NOTE — Care Management Important Message (Signed)
 Important Message  Patient Details  Name: [name] Litt Grosch Jr. MRN: [id number] Date of Birth: 10/23/1945   Important Message Given:  Yes - Medicare IM     Jon Cruel 09/26/2023, 2:44 PM

## 2023-09-26 NOTE — Evaluation (Signed)
 Physical Therapy Evaluation Patient Details Name: [name] Corallo Deemer Jr. MRN: [id number] DOB: 09/15/1945 Today's Date: 09/26/2023  History of Present Illness  Pt is a 78yo male who underwent L transmet amputation and R 2nd toe amp at MPJ level. PMH: DM-I, CVA with L hemiparesis, HTN, HLD, PAD, chronic pain syndrome, MS, COPD  Clinical Impression  Pta pt resided at home with spouse and support [facility] RN and aide. Pt w/c bound and receives assist form HH aide for ADLs and transfers but was able to std pvt with RW with minA however now patient now with L LE NWBing. Despite maxA pt unable to stand up into stedy. Pt cleared by Dr. Malvin for brief L LE WBing for transfers only. Pt able to stand up into stedy with L heel WBing in post op shoe. Pt will need to be able to transfer with use of RW or lateral scoot transfer for transition home. Acute PT to cont to follow.        If plan is discharge home, recommend the following: A lot of help with walking and/or transfers;A lot of help with bathing/dressing/bathroom;Assist for transportation;Help with stairs or ramp for entrance   Can travel by private vehicle        Equipment Recommendations None recommended by PT  Recommendations for Other Services       Functional Status Assessment Patient has had a recent decline in their functional status and demonstrates the ability to make significant improvements in function in a reasonable and predictable amount of time.     Precautions / Restrictions Precautions Precautions: Fall Required Braces or Orthoses: Other Brace Other Brace: bilat post op shoes Restrictions Weight Bearing Restrictions Per Provider Order: Yes RLE Weight Bearing Per Provider Order: Weight bearing as tolerated (in shoe) LLE Weight Bearing Per Provider Order: Non weight bearing (with post op shoe for protection) Other Position/Activity Restrictions: Dr. Malvin gave written consent over secure chat he cant WB for transfers ONLY       Mobility  Bed Mobility Overal bed mobility: Needs Assistance Bed Mobility: Supine to Sit     Supine to sit: Min assist     General bed mobility comments: uses bed rail, labored effort, minA for LE management to prevent sliding off bed due to air mattress    Transfers Overall transfer level: Needs assistance Equipment used: Ambulation equipment used Transfers: Sit to/from Stand, Bed to chair/wheelchair/BSC Sit to Stand: Mod assist           General transfer comment: pt attempted to stand up into stedy with maxA and maintaining L LE NWB, pt unable to stand while maintaining L LE. With WBing through L heel, pt able to stand up to stedy with minA. Pt able to transfer to chair using the stedy. Transfer via Lift Equipment: Stedy  Ambulation/Gait               General Gait Details: pt non-ambulatory  Stairs            Wheelchair Mobility     Tilt Bed    Modified Rankin (Stroke Patients Only)       Balance Overall balance assessment: Needs assistance Sitting-balance support: Feet supported, Bilateral upper extremity supported Sitting balance-Leahy Scale: Fair         Standing balance comment: unable to stand without L LE NWB  Pertinent Vitals/Pain Pain Assessment Pain Assessment: 0-10 Pain Score: 9  Pain Location: all over, most in bilat feet L >R, pt with chronic pain syndrome Pain Descriptors / Indicators:  (nerve pain) Pain Intervention(s): Premedicated before session, Patient requesting pain meds-RN notified (not time for more pain medicine)    Home Living Family/patient expects to be discharged to:: Private residence Living Arrangements: Spouse/significant other Available Help at Discharge: Family;Available 24 hours/day;Neighbor Type of Home: House Home Access: Level entry       Home Layout: One level Home Equipment: Rollator (4 wheels);Electric scooter;Shower seat;BSC/3in1;Wheelchair -  manual Additional Comments: pt reports having HH RN and aide    Prior Function Prior Level of Function : Needs assist             Mobility Comments: uses w/c ADLs Comments: HH aide assists with ADLs     Extremity/Trunk Assessment   Upper Extremity Assessment Upper Extremity Assessment: Generalized weakness (pt with MS, limited fine motor/dexterity)    Lower Extremity Assessment Lower Extremity Assessment:  (flexion contractures in bilat knees)    Cervical / Trunk Assessment Cervical / Trunk Assessment: Kyphotic  Communication   Communication Communication: Impaired Factors Affecting Communication: Hearing impaired    Cognition Arousal: Alert Behavior During Therapy: WFL for tasks assessed/performed   PT - Cognitive impairments: No family/caregiver present to determine baseline                       PT - Cognition Comments: pt tangential and set in ways, aware he is L LE NWB however also aware he is unable to stand without putting weight on L LE due to weakness and increased difficulty Following commands: Intact       Cueing Cueing Techniques: Verbal cues, Tactile cues     General Comments General comments (skin integrity, edema, etc.): bilat feet covered in dressings and ace wrap    Exercises     Assessment/Plan    PT Assessment Patient needs continued PT services  PT Problem List Decreased strength;Decreased range of motion;Decreased activity tolerance;Decreased balance;Decreased mobility       PT Treatment Interventions DME instruction;Functional mobility training;Balance training;Therapeutic activities;Therapeutic exercise    PT Goals (Current goals can be found in the Care Plan section)  Acute Rehab PT Goals Patient Stated Goal: home tomorrow PT Goal Formulation: With patient Time For Goal Achievement: 10/10/23 Potential to Achieve Goals: Fair    Frequency Min 2X/week     Co-evaluation               AM-PAC PT 6 Clicks  Mobility  Outcome Measure Help needed turning from your back to your side while in a flat bed without using bedrails?: A Little Help needed moving from lying on your back to sitting on the side of a flat bed without using bedrails?: A Little Help needed moving to and from a bed to a chair (including a wheelchair)?: A Lot Help needed standing up from a chair using your arms (e.g., wheelchair or bedside chair)?: A Lot Help needed to walk in hospital room?: Total Help needed climbing 3-5 steps with a railing? : Total 6 Click Score: 12    End of Session Equipment Utilized During Treatment: Gait belt Activity Tolerance: Patient tolerated treatment well Patient left: in chair;with call bell/phone within reach;with chair alarm set Nurse Communication: Mobility status PT Visit Diagnosis: Unsteadiness on feet (R26.81);Muscle weakness (generalized) (M62.81)    Time: 8870-8844 PT Time Calculation (min) (ACUTE ONLY): 26 min  Charges:   PT Evaluation $PT Eval Moderate Complexity: 1 Mod PT Treatments $Therapeutic Activity: 8-22 mins PT General Charges $$ ACUTE PT VISIT: 1 Visit         Norene Ames, PT, DPT Acute Rehabilitation Services Secure chat preferred Office #: [phone]   Norene CHRISTELLA Ames 09/26/2023, 4:24 PM

## 2023-09-27 ENCOUNTER — Inpatient Hospital Stay (HOSPITAL_COMMUNITY)

## 2023-09-27 DIAGNOSIS — G894 Chronic pain syndrome: Secondary | ICD-10-CM | POA: Diagnosis not present

## 2023-09-27 DIAGNOSIS — I7090 Unspecified atherosclerosis: Secondary | ICD-10-CM

## 2023-09-27 DIAGNOSIS — M86172 Other acute osteomyelitis, left ankle and foot: Secondary | ICD-10-CM | POA: Diagnosis not present

## 2023-09-27 LAB — GLUCOSE, CAPILLARY
Glucose-Capillary: 196 mg/dL — ABNORMAL HIGH (ref 70–99)
Glucose-Capillary: 189 mg/dL — ABNORMAL HIGH (ref 70–99)
Glucose-Capillary: 250 mg/dL — ABNORMAL HIGH (ref 70–99)

## 2023-09-27 LAB — VAS US ABI WITH/WO TBI: Left ABI: 1.19

## 2023-09-27 MED ORDER — DOXYCYCLINE HYCLATE 100 MG PO TABS
100.0000 mg | ORAL_TABLET | Freq: Two times a day (BID) | ORAL | 0 refills | Status: AC
  Filled 2023-09-27: qty 14, 7d supply, fill #0

## 2023-09-27 NOTE — Progress Notes (Signed)
 PROGRESS NOTE    [name] M Tigue Jr.  FMW:[id number] DOB: [birth year] DOA: 09/22/2023 PCP: Shepard Ade, MD  No chief complaint on file.   Brief Narrative:   78 y.o. 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   Per history obtained-apparently he developed an ulcer approximately Dymir Neeson month ago-and has been on the observation by his podiatrist.  Approximately 2-3 days back-this ulcer started draining purulent material.  He was evaluated by his home health RN yesterday, and asked to go to podiatry office.  At the podiatry office-purulent discharge was confirmed-positive probe to the bone-cultures were obtained-x-ray apparently was consistent with osteomyelitis.  He was then referred to the hospitalist service as Abdimalik Mayorquin direct admit.    Assessment & Plan:   Active Problems:   Type 1 diabetes mellitus with complications (HCC)   Pure hypercholesterolemia   Essential hypertension   Chronic pain syndrome   Coronary artery disease involving native coronary artery   History of CVA (cerebrovascular accident)   Osteomyelitis (HCC)   Acute osteomyelitis of left foot (HCC)   Acquired deformity of toe, right  Left fourth toe ulceration with acute osteomyelitis -Status post transmetatarsal amputation of left foot, amputation of second toe at MPJ level, right foot -ABI ordered, currently pending -MRI L foot with soft tissue swelling in the 4th toe with skin ulceration dorsal to proximal interphalangeal joint, abnormaal marrow signal throughout the 4th digit with questionable mild cortical destruction of the proximal phalanx (concerning for osteo).   -CRP 12.3, sed rate 73 - now s/p transmetatarsal amputation of L foot, amputation of 2nd toe at MPJ level R foot - As per podiatry,  recommend Augmentin  for 5 days, plan for bilateral foot dressing change tomorrow and then leave intact until follow-up with podiatry as outpatient. 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 Continue aspirin /statin Apparently has been holding Plavix  which will be held until he is postop. -Plavix  restarted after discussion with podiatry -R ABI noncompressible - this is stable from 06/2023, L abi 1.19 - will need outpatient vascular follow up   History of CAD-s/p PCI 2006 No anginal symptoms Continue antiplatelet/statin/beta-blocker   History of CVA with left-sided hemiparesis Continue antiplatelet/statin mostly uses Ambrea Hegler wheelchair   History of COPD on home O2 No SOB   History of multiple sclerosis At baseline Does not appear to be on any disease modifying agents.   CKD stage IIIa Creatinine around baseline on 5/18 (1.3-1.5 baseline creatinine it appears)  History of chronic pain syndrome Continue Nucynta /as needed oxycodone .   History of peripheral neuropathy Likely related to diabetes Continue Cymbalta /narcotics.   BPH Finasteride    Anxiety/depression Cymbalta /trazodone .   Chronic debility/deconditioning Mostly wheelchair-bound but able to walk to the bathroom with Consuelo Thayne walker. PT/OT eval - recommending HH PT    DVT  prophylaxis: SCD, lovenox  Code Status: DNR Family Communication: none Disposition:   Status is: Inpatient Remains inpatient appropriate  because: need for continued inpatient care   Consultants:  podiatry  Procedures:  8/18 Procedure:  1. Transmetatarsal amputation of LEFT foot 2. Amputation of 2nd toe at MPJ level, RIGHT foot  Antimicrobials:  Anti-infectives (From admission, onward)    Start     Dose/Rate Route Frequency Ordered Stop   09/25/23 1600  vancomycin  (VANCOREADY) IVPB 1250 mg/250 mL  Status:  Discontinued        1,250 mg 166.7 mL/hr over 90 Minutes Intravenous Every 24 hours 09/24/23 1528 09/25/23 1009   09/25/23 1000  amoxicillin -clavulanate (AUGMENTIN ) 875-125 MG per tablet 1 tablet        1 tablet Oral Every 12 hours 09/25/23 0948 09/30/23 0959   09/24/23 1615  vancomycin  (VANCOREADY) IVPB 1750 mg/350 mL        1,750 mg 175 mL/hr over 120 Minutes Intravenous  Once 09/24/23 1528 09/24/23 1831   09/24/23 1615  piperacillin -tazobactam (ZOSYN ) IVPB 3.375 g  Status:  Discontinued        3.375 g 12.5 mL/hr over 240 Minutes Intravenous Every 8 hours 09/24/23 1528 09/25/23 0948   09/24/23 1515  piperacillin -tazobactam (ZOSYN ) IVPB 3.375 g  Status:  Discontinued        3.375 g 12.5 mL/hr over 240 Minutes Intravenous  Once 09/24/23 1422 09/24/23 1527       Subjective: No new complaints  Objective: Vitals:   09/26/23 2100 09/27/23 0459 09/27/23 0752 09/27/23 1622  BP: (!) 143/99 (!) 158/57 139/67 134/84  Pulse: 65 67 65 67  Resp: 18 18 16 17   Temp: 98.2 F (36.8 C) 98.6 F (37 C)  (!) 97.5 F (36.4 C)  TempSrc: Oral Oral  Oral  SpO2: 95% 97% 95% 98%  Weight:      Height:        Intake/Output Summary (Last 24 hours) at 09/27/2023 1657 Last data filed at 09/27/2023 1200 Gross per 24 hour  Intake --  Output 300 ml  Net -300 ml   Filed Weights   09/24/23 1516 09/25/23 0713  Weight: 81.6 kg (P) 81.6 kg    Examination:  General exam: Appears calm and comfortable  Respiratory system: unlabored Cardiovascular system: RRR Gastrointestinal system: Abdomen is nondistended, soft and  nontender. Central nervous system: Alert and oriented. No focal neurological deficits. Extremities: dressing to bilateral LE   Data Reviewed: I have personally reviewed following labs and imaging studies  CBC: Recent Labs  Lab 09/22/23 1732 09/23/23 0725 09/25/23 0436  WBC 7.0 6.1 5.9  HGB 12.7* 12.1* 13.1  HCT 37.4* 36.0* 38.3*  MCV 100.0 99.7 98.7  PLT 53* 52* 49*    Basic Metabolic Panel: Recent Labs  Lab 09/22/23 1732 09/23/23 0725 09/25/23 0436  NA 134* 136 136  K 4.8 4.2 4.6  CL 101 101 100  CO2 26 30 27   GLUCOSE 285* 90 271*  BUN 35* 30* 31*  CREATININE 1.85* 1.52* 1.55*  CALCIUM  8.6* 8.4* 8.2*    GFR: Estimated Creatinine Clearance: 44.5 mL/min (Samari Bittinger) (by C-G formula based on SCr of 1.55 mg/dL (H)).  Liver Function Tests: Recent Labs  Lab 09/22/23 1732  AST 17  ALT 14  ALKPHOS 213*  BILITOT 0.9  PROT 7.1  ALBUMIN  2.4*    CBG: Recent Labs  Lab 09/26/23 1200 09/26/23 1700 09/26/23 2103 09/27/23 0751 09/27/23 1146  GLUCAP 162* 202* 108* 196* 189*     Recent  Results (from the past 240 hours)  WOUND CULTURE     Status: Abnormal   Collection Time: 09/22/23 12:00 AM   Specimen: Toe, Left; Wound  Result Value Ref Range Status   MICRO NUMBER: [id number]  Final   SPECIMEN QUALITY: Adequate  Final   SOURCE: LEFT 4TH TOE ULCER  Final   STATUS: FINAL  Final   GRAM STAIN:   Final    No white blood cells seen No epithelial cells seen Many Gram positive cocci in clusters   ISOLATE 1: Staphylococcus aureus (Jeanette Rauth)  Final    Comment: Heavy growth of Staphylococcus aureus This isolate demonstrates inducible clindamycin resistance.      Susceptibility   Staphylococcus aureus - AEROBIC CULT, GRAM STAIN POSITIVE 1    VANCOMYCIN  <=0.5 Sensitive     CIPROFLOXACIN <=0.5 Sensitive     CLINDAMYCIN NR Resistant     LEVOFLOXACIN 0.25 Sensitive     ERYTHROMYCIN >=8 Resistant     GENTAMICIN  <=0.5 Sensitive     OXACILLIN* <=0.25 Sensitive      * Oxacillin  susceptible staphylococci are susceptible to other penicillinase-stable penicillins (e.g., methicillin, nafcillin), beta- lactam/beta-lactamase inhibitor combinations, and cephems with staphylococcal indications, including cefazolin .     TETRACYCLINE <=1 Sensitive     TRIMETH/SULFA <=10 Sensitive     MOXIFLOXACIN* <=0.25 Sensitive      * Oxacillin susceptible staphylococci are susceptible to other penicillinase-stable penicillins (e.g., methicillin, nafcillin), beta- lactam/beta-lactamase inhibitor combinations, and cephems with staphylococcal indications, including cefazolin . Legend: S = Susceptible  I = Intermediate R = Resistant  NS = Not susceptible SDD = Susceptible Dose Dependent * = Not Tested  NR = Not Reported **NN = See Therapy Comments   MRSA Next Gen by PCR, Nasal     Status: None   Collection Time: 09/25/23 12:54 AM   Specimen: Nasal Mucosa; Nasal Swab  Result Value Ref Range Status   MRSA by PCR Next Gen NOT DETECTED NOT DETECTED Final    Comment: (NOTE) The GeneXpert MRSA Assay (FDA approved for NASAL specimens only), is one component of Jakyrie Totherow comprehensive MRSA colonization surveillance program. It is not intended to diagnose MRSA infection nor to guide or monitor treatment for MRSA infections. Test performance is not FDA approved in patients less than 25 years old. Performed at [facility], [address], [place], [place] [address]   Aerobic/Anaerobic Culture w Gram Stain (surgical/deep wound)     Status: None (Preliminary result)   Collection Time: 09/25/23  8:50 AM   Specimen: Foot, Left; Tissue  Result Value Ref Range Status   Specimen Description TISSUE  Final   Special Requests NONE  Final   Gram Stain   Final    NO WBC SEEN FEW GRAM POSITIVE COCCI Performed at [facility], [address], [place], [place] [address]    Culture   Final    ABUNDANT STAPHYLOCOCCUS AUREUS CULTURE REINCUBATED FOR BETTER GROWTH NO ANAEROBES ISOLATED;  CULTURE IN PROGRESS FOR 5 DAYS    Report Status PENDING  Incomplete         Radiology Studies: VAS US  ABI WITH/WO TBI Result Date: 09/27/2023  LOWER EXTREMITY DOPPLER STUDY Patient Name:  [name] RINDFLEISCH Spagnolo JR.  Date of Exam:   09/27/2023 Medical Rec #: [id number]          Accession #:    [id number] Date of Birth: [birth year]          Patient Gender: M Patient Age:   24  years Exam Location:  [facility] Procedure:      VAS US  ABI WITH/WO TBI Referring Phys: DONALDA APPLEBAUM --------------------------------------------------------------------------------  Indications: Peripheral artery disease. High Risk Factors: Hypertension, hyperlipidemia, Diabetes, past history of                    smoking, prior MI, coronary artery disease, prior CVA. Other Factors: CKD, hx of coronary stent placement, left TMA and right second                toe amputation on 09/25/2023.  Limitations: Today's exam was limited due to involuntary patient movement and              bandages. Comparison Study: Previous exam 06/21/2023 Performing Technologist: Ezzie Potters RVT, RDMS  Examination Guidelines: Berwyn Bigley complete evaluation includes at minimum, Doppler waveform signals and systolic blood pressure reading at the level of bilateral brachial, anterior tibial, and posterior tibial arteries, when vessel segments are accessible. Bilateral testing is considered an integral part of Assata Juncaj complete examination. Photoelectric Plethysmograph (PPG) waveforms and toe systolic pressure readings are included as required and additional duplex testing as needed. Limited examinations for reoccurring indications may be performed as noted.  ABI Findings: +---------+------------------+-----+---------+----------------+ Right    Rt Pressure (mmHg)IndexWaveform Comment          +---------+------------------+-----+---------+----------------+ Brachial 175                    triphasic                  +---------+------------------+-----+---------+----------------+ PTA                             biphasic non compressible +---------+------------------+-----+---------+----------------+ DP                              triphasicnon compressible +---------+------------------+-----+---------+----------------+ Great Toe                                amputated        +---------+------------------+-----+---------+----------------+ +---------+------------------+-----+----------------+---------+ Left     Lt Pressure (mmHg)IndexWaveform        Comment   +---------+------------------+-----+----------------+---------+ Brachial 170                    triphasic                 +---------+------------------+-----+----------------+---------+ PTA      208               1.19 audibly biphasic          +---------+------------------+-----+----------------+---------+ DP       179               1.02 audibly biphasic          +---------+------------------+-----+----------------+---------+ Great Toe                                       amputated +---------+------------------+-----+----------------+---------+ +-------+-----------+-----------+------------+-------------+ ABI/TBIToday's ABIToday's TBIPrevious ABIPrevious TBI  +-------+-----------+-----------+------------+-------------+ Right           amputated  1.56        amputated     +-------+-----------+-----------+------------+-------------+ Left   1.19       amputated  1.01  wound/bandage +-------+-----------+-----------+------------+-------------+  *See table(s) above for measurements and observations.  Electronically signed by Gaile New MD on 09/27/2023 at 4:07:44 PM.    Final         Scheduled Meds:  amLODipine   5 mg Oral q morning   amoxicillin -clavulanate  1 tablet Oral Q12H   aspirin   81 mg Oral q AM   atorvastatin   10 mg Oral QHS   clopidogrel   75 mg Oral Daily   DULoxetine   60 mg Oral BID    enoxaparin  (LOVENOX ) injection  40 mg Subcutaneous Q24H   finasteride   5 mg Oral q AM   Gerhardt's butt cream   Topical BID   insulin  aspart  0-9 Units Subcutaneous TID WC   insulin  glargine  25 Units Subcutaneous Daily   metoprolol  tartrate  25 mg Oral BID   tapentadol   200 mg Oral Q12H   traZODone   150 mg Oral QHS   Continuous Infusions:   LOS: 5 days    Time spent: over 30 min    Meliton Monte, MD [facility]   To contact the attending provider between 7A-7P or the covering provider during after hours 7P-7A, please log into the web site [email or web address] and access using universal [place] password for that web site. If you do not have the password, please call the hospital operator.  09/27/2023, 4:57 PM

## 2023-09-27 NOTE — Progress Notes (Signed)
 Physical Therapy Treatment Patient Details Name: [name] Culp Delduca Jr. MRN: [id number] DOB: 12-02-1945 Today's Date: 09/27/2023   History of Present Illness Pt is a 78yo male who underwent L transmet amputation and R 2nd toe amp at MPJ level. PMH: DM-I, CVA with L hemiparesis, HTN, HLD, PAD, chronic pain syndrome, MS, COPD    PT Comments  Pt with improved strength and ability to stand this date from elevated surfaces up to RW. Pt reports having a lift chair that will stand him up then he just has to pvt to his w/c. Focused on sit to stands and trying to maintain L LE NWB as much as possible. Pt with minimal L heel weightbearing during standing but able to maintain L LE NWB during static standing. Acute PT to cont to follow.    If plan is discharge home, recommend the following: A lot of help with walking and/or transfers;A lot of help with bathing/dressing/bathroom;Assist for transportation;Help with stairs or ramp for entrance   Can travel by private vehicle        Equipment Recommendations  None recommended by PT    Recommendations for Other Services       Precautions / Restrictions Precautions Precautions: Fall Required Braces or Orthoses: Other Brace Other Brace: bilat post op shoes Restrictions Weight Bearing Restrictions Per Provider Order: Yes RLE Weight Bearing Per Provider Order: Weight bearing as tolerated LLE Weight Bearing Per Provider Order: Non weight bearing Other Position/Activity Restrictions: Dr. Malvin gave written consent over secure chat he cant WB for transfers ONLY     Mobility  Bed Mobility Overal bed mobility: Needs Assistance Bed Mobility: Supine to Sit     Supine to sit: Min assist     General bed mobility comments: uses bed rail, labored effort, minA for LE management to prevent sliding off bed due to air mattress    Transfers Overall transfer level: Needs assistance Equipment used: Rolling walker (2 wheels) Transfers: Sit to/from Stand Sit  to Stand: Min assist           General transfer comment: pt reports having a lift chair that stands him right up. Worked on sit to stands from EOB up to RW with bed height elevated. pt able to standing with minA and assist to stabilize RW x 3. Pt with slight WBing through L heel only, once in standing pt able to maintain L LE NWB, in attempt to step/hop towards [facility] pt did have to put L heel briefly down on floor (post op shoe on)    Ambulation/Gait               General Gait Details: pt non-ambulatory   Stairs             Wheelchair Mobility     Tilt Bed    Modified Rankin (Stroke Patients Only)       Balance Overall balance assessment: Needs assistance Sitting-balance support: Feet supported, Bilateral upper extremity supported Sitting balance-Leahy Scale: Fair     Standing balance support: Bilateral upper extremity supported, Reliant on assistive device for balance Standing balance-Leahy Scale: Zero Standing balance comment: reliant on RW due ot L LE NWB                            Communication Communication Communication: Impaired Factors Affecting Communication: Hearing impaired  Cognition Arousal: Alert Behavior During Therapy: WFL for tasks assessed/performed   PT - Cognitive impairments: No family/caregiver present to  determine baseline                       PT - Cognition Comments: pt tangential and set in ways, hyperfocused on returning home to get his medications back on track Following commands: Intact      Cueing Cueing Techniques: Verbal cues, Tactile cues  Exercises      General Comments General comments (skin integrity, edema, etc.): bilat feet covered with dressings, VSS      Pertinent Vitals/Pain Pain Assessment Pain Assessment: 0-10 Pain Score: 10-Worst pain ever Pain Location: all over, most in bilat feet L >R, pt with chronic pain syndrome Pain Descriptors / Indicators:  (nerve pain) Pain  Intervention(s): Premedicated before session    Home Living                          Prior Function            PT Goals (current goals can now be found in the care plan section) Acute Rehab PT Goals Patient Stated Goal: home tomorrow PT Goal Formulation: With patient Time For Goal Achievement: 10/10/23 Potential to Achieve Goals: Fair Progress towards PT goals: Progressing toward goals    Frequency    Min 2X/week      PT Plan      Co-evaluation              AM-PAC PT 6 Clicks Mobility   Outcome Measure  Help needed turning from your back to your side while in a flat bed without using bedrails?: A Little Help needed moving from lying on your back to sitting on the side of a flat bed without using bedrails?: A Little Help needed moving to and from a bed to a chair (including a wheelchair)?: A Lot Help needed standing up from a chair using your arms (e.g., wheelchair or bedside chair)?: A Lot Help needed to walk in hospital room?: Total Help needed climbing 3-5 steps with a railing? : Total 6 Click Score: 12    End of Session Equipment Utilized During Treatment: Gait belt Activity Tolerance: Patient tolerated treatment well Patient left: with call bell/phone within reach;with bed alarm set;in bed Nurse Communication: Mobility status PT Visit Diagnosis: Unsteadiness on feet (R26.81);Muscle weakness (generalized) (M62.81)     Time: 1017-1040 PT Time Calculation (min) (ACUTE ONLY): 23 min  Charges:    $Therapeutic Activity: 23-37 mins PT General Charges $$ ACUTE PT VISIT: 1 Visit                     Norene Ames, PT, DPT Acute Rehabilitation Services Secure chat preferred Office #: [phone]    Norene CHRISTELLA Ames 09/27/2023, 11:26 AM

## 2023-09-27 NOTE — Plan of Care (Signed)

## 2023-09-27 NOTE — TOC Initial Note (Addendum)
 Transition of Care (TOC) - Initial/Assessment Note   Spoke to patient at bedside and wife [name] via speaker phone [phone] .   PT recommendations for home HHPT . Patient has DME at home already   Patient active with [facility] for [facility] and aide. They would like to continue services with [facility] .   NCM left voicemail for [place] with [employer] . Await call back. Will need orders and face to face . Lynette accepted referral. Entered orders for MD to sign. Secure chatted MD   Wife asking for nurse to call her when patient ready for discharge and she will provide transportation home. Nurse aware   Per MD note Plan for bilateral foot dressing change today , then likely leave intact until follow up next Tuesday . At that time [facility] will need updated wound care orders   Patient Details  Name: [name] Bernstein Duhart Jr. MRN: [id number] Date of Birth: 1945/04/04  Transition of Care [facility]) CM/SW Contact:    Stephane Powell Jansky, RN Phone Number: 09/27/2023, 8:35 AM  Clinical Narrative:                   Expected Discharge Plan: Home w Home Health Services Barriers to Discharge: Continued Medical Work up   Patient Goals and CMS Choice Patient states their goals for this hospitalization and ongoing recovery are:: to return to home CMS Medicare.gov Compare Post Acute Care list provided to:: Patient Choice offered to / list presented to : Patient, Spouse      Expected Discharge Plan and Services   Discharge Planning Services: CM Consult Post Acute Care Choice: Home Health Living arrangements for the past 2 months: Single Family Home                 DME Arranged: N/A DME Agency: NA       HH Arranged: RN, PT, Nurse's Aide HH Agency: [facility] Date HH Agency Contacted: 09/27/23 Time HH Agency Contacted: [phone] Representative spoke with at [facility] Agency: Lynette left message  Prior Living Arrangements/Services Living arrangements for the past 2 months: Single Family Home Lives  with:: Spouse Patient language and need for interpreter reviewed:: Yes Do you feel safe going back to the place where you live?: Yes      Need for Family Participation in Patient Care: Yes (Comment) Care giver support system in place?: Yes (comment) Current home services: DME Criminal Activity/Legal Involvement Pertinent to Current Situation/Hospitalization: No - Comment as needed  Activities of Daily Living   ADL Screening (condition at time of admission) Independently performs ADLs?: No Does the patient have a NEW difficulty with bathing/dressing/toileting/[place]-feeding that is expected to last >3 days?: No Does the patient have a NEW difficulty with getting in/out of bed, walking, or climbing stairs that is expected to last >3 days?: No Does the patient have a NEW difficulty with communication that is expected to last >3 days?: No Is the patient deaf or have difficulty hearing?: Yes Does the patient have difficulty seeing, even when wearing glasses/contacts?: No Does the patient have difficulty concentrating, remembering, or making decisions?: No  Permission Sought/Granted   Permission granted to share information with : Yes, Verbal Permission Granted  Share Information with NAME: wife [name] [phone]  Permission granted to share info w AGENCY: [facility]        Emotional Assessment Appearance:: Appears stated age Attitude/Demeanor/Rapport: Engaged Affect (typically observed): Appropriate Orientation: : Oriented to [name], Oriented to Place, Oriented to  Time, Oriented to Situation Alcohol  / Substance Use: Not Applicable Psych Involvement: No (comment)  Admission diagnosis:  Osteomyelitis (HCC) [M86.9] Patient Active Problem List   Diagnosis Date Noted   Acquired deformity of toe, right 09/25/2023   Acute osteomyelitis of left foot (HCC) 09/23/2023   Osteomyelitis of toe of left foot (HCC) 06/21/2023   Osteomyelitis of great toe of right foot (HCC) 03/01/2023    Osteomyelitis (HCC) 02/28/2023   Contusion of second toe of left foot 02/09/2023   Status post amputation of left great toe (HCC) 02/09/2023   Chronic kidney disease, stage 3a (HCC) 10/10/2022   Diabetic ulcer of toe (HCC) 10/10/2022   Thrombocytopenia (HCC) 10/10/2022   Local infection of skin and subcutaneous tissue 09/21/2022   Chronic pain syndrome 04/01/2017   Coronary artery disease involving native coronary artery 03/31/2017   Depression 03/31/2017   History of CVA (cerebrovascular accident) 03/31/2017   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/05/2015   Other fatigue 10/05/2015   Dyspnea 10/05/2015   Late effects of cerebrovascular accident 04/20/2013   Multiple sclerosis (HCC) 06/04/2012   Abnormality of gait 03/31/2011   Diabetic polyneuropathy (HCC) 03/31/2011   Pure hypercholesterolemia 01/07/2009   Type 1 diabetes mellitus with complications (HCC) 01/06/2009   B12 DEFICIENCY 01/06/2009   Essential hypertension 01/06/2009   PCP:  Shepard Ade, MD Pharmacy:   [facility] APFS No address on file   [facility] [address] [place] [address] Phone: [phone] Fax: [phone]  [facility] [place], [place] - [address] [place] [address] Phone: [phone] Fax: [phone]  [facility] [address] [place] [address] Phone: [phone] Fax: [phone]     Social Drivers of Health (SDOH) Social History: SDOH Screenings   Food Insecurity: No Food Insecurity (09/22/2023)  Housing: Low Risk  (09/22/2023)  Transportation Needs: No Transportation Needs (09/22/2023)  Utilities: Not At Risk (09/22/2023)  Social Connections: Unknown (09/22/2023)  Tobacco Use: Medium Risk (09/25/2023)   SDOH Interventions:      Readmission Risk Interventions    02/13/2023   10:26 AM  Readmission Risk Prevention Plan  Transportation Screening Complete  PCP or Specialist Appt within 3-5 Days Complete  Social Work Consult for Recovery Care Planning/Counseling Complete  Palliative Care Screening Not Applicable  Medication Review Oceanographer) Referral to Pharmacy

## 2023-09-27 NOTE — Discharge Summary (Addendum)
 Physician Discharge Summary  [name]. FMW:[id number] DOB: [birth year] DOA: 09/22/2023  PCP: Shepard Ade, MD  Admit date: 09/22/2023 Discharge date: 09/27/2023  Time spent: 40 minutes  Recommendations for Outpatient Follow-up:  Follow outpatient CBC/CMP  Follow with podiatry outpatient Follow with PCP outpatient for thrombocytopenia - caution with plavix /aspirin  Follow with vascular outpatient, abnormal ABI's Follow final culture results   Discharge Diagnoses:  Active Problems:   Type 1 diabetes mellitus with complications (HCC)   Pure hypercholesterolemia   Essential hypertension   Chronic pain syndrome   Coronary artery disease involving native coronary artery   History of CVA (cerebrovascular accident)   Osteomyelitis (HCC)   Acute osteomyelitis of left foot (HCC)   Acquired deformity of toe, right   Discharge Condition: stable  Diet recommendation: heart healthy, diabetic  Filed Weights   09/24/23 1516 09/25/23 0713  Weight: 81.6 kg (P) 81.6 kg    History of present illness:   78 y.o. 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   Per history obtained-apparently he developed an ulcer approximately [name] month ago-and has been on the observation by his podiatrist.  Approximately 2-3 days back-this ulcer started draining purulent material.  He was evaluated by his home health RN yesterday, and asked to go to podiatry office.  At the podiatry office-purulent discharge was confirmed-positive probe to the bone-cultures were obtained-x-ray apparently was consistent with osteomyelitis.  He was then referred to the hospitalist service as [name] direct admit.  He's now s/p transmetatarsal amputation of L foot, amputation of 2nd toe at MPJ level R foot.   Discharging home today with [facility] and outpatient podiatry follow up.   Hospital Course:  Assessment and Plan:  Left fourth toe ulceration with acute osteomyelitis -Status post transmetatarsal amputation of left foot, amputation of second toe at MPJ level, right foot -MRI L foot with soft tissue swelling in the 4th toe with skin ulceration dorsal to proximal interphalangeal joint, abnormaal marrow signal throughout the 4th digit with questionable mild cortical destruction of the proximal phalanx (concerning for osteo).   -CRP 12.3, sed rate 73 - now s/p transmetatarsal amputation of L foot, amputation of 2nd toe at MPJ level R foot - culture from 8/18 with staph aureus, sensitivities pending - follow outpatient  - podiatry changed dressing today, leave clean and dry until follow up in 1 week in the office.  Will send with doxycycline  x7 days based on my discussion with Dr. [name] today.  WBAT in surgical shoes transition purposes only.   Thrombocytopenia Worsened this admission, continue aspirin /plavix  for now, but he needs outpatient follow up and additional workup if it's persistent   DM-1 Resume home regimen at discharge  HTN Continue amlodipine  and metoprolol  Blood pressure well-controlled   HLD Statin   PAD Numerous amputations-right first great toe, left great toe, 1st and 2nd toe. Continue aspirin /statin Apparently has been holding Plavix  which will be held until he is postop. -Plavix  restarted after discussion with podiatry -R ABI noncompressible - this is stable from 06/2023 (has palpable pulse), L abi 1.19 - will need outpatient vascular follow up   History of CAD-s/p PCI 2006 No anginal symptoms Continue antiplatelet/statin/beta-blocker   History of CVA with left-sided hemiparesis Continue antiplatelet/statin mostly uses [name] wheelchair   History of COPD on home O2 No SOB  History of multiple sclerosis At baseline Does not appear to be on any disease modifying  agents.   CKD stage IIIa Creatinine around baseline on 5/18 (1.3-1.5 baseline creatinine it appears)   History of chronic pain syndrome Continue Nucynta /as needed oxycodone .   History of peripheral neuropathy Likely related to diabetes Continue Cymbalta /narcotics.   BPH Finasteride    Anxiety/depression Cymbalta /trazodone .   Chronic debility/deconditioning Mostly wheelchair-bound but able to walk to the bathroom with [name] walker. PT/OT eval - recommending HH PT     Procedures:  8/18 Procedure:  1. Transmetatarsal amputation of LEFT foot 2. Amputation of 2nd toe at MPJ level, RIGHT foot  Consultations: podiatry  Discharge Exam: Vitals:   09/27/23 0752 09/27/23 1622  BP: 139/67 134/84  Pulse: 65 67  Resp: 16 17  Temp:  (!) 97.5 F (36.4 C)  SpO2: 95% 98%   See progress note Discussed d/c plans with wife  Discharge Instructions   Discharge Instructions     Call MD for:  difficulty breathing, headache or visual disturbances   Complete by: As directed    Call MD for:  extreme fatigue   Complete by: As directed    Call MD for:  hives   Complete by: As directed    Call MD for:  persistant dizziness or light-headedness   Complete by: As directed    Call MD for:  persistant nausea and vomiting   Complete by: As directed    Call MD for:  redness, tenderness, or signs of infection (pain, swelling, redness, odor or green/yellow discharge around incision site)   Complete by: As directed    Call MD for:  severe uncontrolled pain   Complete by: As directed    Call MD for:  temperature >100.4   Complete by: As directed    Diet - low sodium heart healthy   Complete by: As directed    Discharge instructions   Complete by: As directed    You were seen for osteomyelitis.  You had [name] transmetatarsal amputation of your left foot and and amputation of the second toe on your right foot.  We'll send you home with [name] plan to complete [name] 7 day course of antibiotics.  Follow  up with podiatry as an outpatient.  You should only weight bear minimally as tolerated in surgical shoes for transitions only.  Leave the dressings in place and follow up in the office in 1 week.  Follow the final cultures with podiatry outpatient.   You have some abnormal arterial studies and should follow with vascular outpatient.  Your platelets are very low.  This warrants outpatient follow up.  You need repeat labs and additional workup if this is persistent or worsening.  This is important in light of your taking plavix  and aspirin  as it puts you at risk for bleeding.   Return for new, recurrent, or worsening symptoms.  Please ask your PCP to request records from this hospitalization so they know what was done and what the next steps will be.   Discharge wound care:   Complete by: As directed    Wound care of feet per podiatry   1.  Cleanse buttocks with soap and water , dry and apply Gerhardt Butt Cream to sacrum/buttocks 2 times daily and as needed for soiling. Cover with silicone foam or ABD pad and tape whichever is preferred.   Increase activity slowly   Complete by: As directed       Allergies as of 09/27/2023   No  Known Allergies      Medication List     TAKE these medications    ALLERGY RELIEF PO Take 1 tablet by mouth as needed.   amLODipine  5 MG tablet Commonly known as: NORVASC  Take 5 mg by mouth every morning.   aspirin  81 MG tablet Take 81 mg by mouth in the morning.   atorvastatin  10 MG tablet Commonly known as: LIPITOR Take 10 mg by mouth at bedtime.   clopidogrel  75 MG tablet Commonly known as: PLAVIX  Take 75 mg by mouth in the morning.   CRANBERRY PO Take 1 tablet by mouth in the morning and at bedtime.   doxycycline  100 MG tablet Commonly known as: VIBRA -TABS Take 1 tablet (100 mg total) by mouth 2 (two) times daily for 7 days.   DULoxetine  60 MG capsule Commonly known as: CYMBALTA  Take 60 mg by mouth 2 (two) times daily.   finasteride   5 MG tablet Commonly known as: PROSCAR  Take 5 mg by mouth in the morning.   Gerhardt's butt cream Crea Apply 1 Application topically 3 (three) times daily.   HumaLOG KwikPen 100 UNIT/ML KwikPen Generic drug: insulin  lispro Inject 0-15 Units into the skin 3 (three) times daily as needed (BS > 150).   HumuLIN  N KwikPen 100 UNIT/ML KwikPen Generic drug: Insulin  NPH (Human) (Isophane) Inject 10-20 Units into the skin See admin instructions. Inject 20 units in the morning and 10 units in the evening depending on blood sugar.   metoprolol  tartrate 25 MG tablet Commonly known as: LOPRESSOR  Take 25 mg by mouth 2 (two) times daily.   Nucynta  ER 200 MG Tb12 Generic drug: Tapentadol  HCl Take 200 mg by mouth every 12 (twelve) hours. Start taking on: September 29, 2023   Nutritional Shake Liqd Take 1 Bottle by mouth daily.   oxyCODONE  15 MG immediate release tablet Commonly known as: ROXICODONE  Take 1 tablet (15 mg total) by mouth every 6 (six) hours as needed for pain. Start taking on: September 29, 2023   polyethylene glycol powder 17 GM/SCOOP powder Commonly known as: GLYCOLAX /MIRALAX  Take 17 g by mouth 2 (two) times daily. What changed:  when to take this reasons to take this   traZODone  150 MG tablet Commonly known as: DESYREL  Take 150 mg by mouth at bedtime.               Discharge Care Instructions  (From admission, onward)           Start     Ordered   09/27/23 0000  Discharge wound care:       Comments: Wound care of feet per podiatry   1.  Cleanse buttocks with soap and water , dry and apply Gerhardt Butt Cream to sacrum/buttocks 2 times daily and as needed for soiling. Cover with silicone foam or ABD pad and tape whichever is preferred.   09/27/23 1909           No Known Allergies  Follow-up Information     [facility] Follow up.   Specialty: [facility] Contact information: [address] [place] [address] [place]  [address] [phone]         Malvin Marsa FALCON, DPM Follow up.   Specialty: Podiatry Why: follow up as scheduled Contact information: [address] [place] [address] [phone]         Shepard Ade, MD Follow up.   Specialty: Internal Medicine Why: follow with your PCP, discuss your low  platelets and your hospitalization Contact information: [address] [place] [address] [phone]                  The results of significant diagnostics from this hospitalization (including imaging, microbiology, ancillary and laboratory) are listed below for reference.    Significant Diagnostic Studies: VAS US  ABI WITH/WO TBI Result Date: 09/27/2023  LOWER EXTREMITY DOPPLER STUDY Patient Name:  [name] Shatz JR.  Date of Exam:   09/27/2023 Medical Rec #: [id number]          Accession #:    [id number] Date of Birth: 25-Mar-1945          Patient Gender: M Patient Age:   78 years Exam Location:  [facility] Procedure:      VAS US  ABI WITH/WO TBI Referring Phys: DONALDA APPLEBAUM --------------------------------------------------------------------------------  Indications: Peripheral artery disease. High Risk Factors: Hypertension, hyperlipidemia, Diabetes, past history of                    smoking, prior MI, coronary artery disease, prior CVA. Other Factors: CKD, hx of coronary stent placement, left TMA and right second                toe amputation on 09/25/2023.  Limitations: Today's exam was limited due to involuntary patient movement and              bandages. Comparison Study: Previous exam 06/21/2023 Performing Technologist: Ezzie Potters RVT, RDMS  Examination Guidelines: Garlin Batdorf complete evaluation includes at minimum, Doppler waveform signals and systolic blood pressure reading at the level of bilateral brachial, anterior tibial, and posterior tibial arteries, when vessel segments are accessible. Bilateral testing is considered an integral part  of Wilmary Levit complete examination. Photoelectric Plethysmograph (PPG) waveforms and toe systolic pressure readings are included as required and additional duplex testing as needed. Limited examinations for reoccurring indications may be performed as noted.  ABI Findings: +---------+------------------+-----+---------+----------------+ Right    Rt Pressure (mmHg)IndexWaveform Comment          +---------+------------------+-----+---------+----------------+ Brachial 175                    triphasic                 +---------+------------------+-----+---------+----------------+ PTA                             biphasic non compressible +---------+------------------+-----+---------+----------------+ DP                              triphasicnon compressible +---------+------------------+-----+---------+----------------+ Great Toe                                amputated        +---------+------------------+-----+---------+----------------+ +---------+------------------+-----+----------------+---------+ Left     Lt Pressure (mmHg)IndexWaveform        Comment   +---------+------------------+-----+----------------+---------+ Brachial 170                    triphasic                 +---------+------------------+-----+----------------+---------+ PTA      208               1.19 audibly biphasic          +---------+------------------+-----+----------------+---------+ DP  179               1.02 audibly biphasic          +---------+------------------+-----+----------------+---------+ Great Toe                                       amputated +---------+------------------+-----+----------------+---------+ +-------+-----------+-----------+------------+-------------+ ABI/TBIToday's ABIToday's TBIPrevious ABIPrevious TBI  +-------+-----------+-----------+------------+-------------+ Right  Wilson         amputated  1.56        amputated      +-------+-----------+-----------+------------+-------------+ Left   1.19       amputated  1.01        wound/bandage +-------+-----------+-----------+------------+-------------+  *See table(s) above for measurements and observations.  Electronically signed by Gaile New MD on 09/27/2023 at 4:07:44 PM.    Final    DG Foot 2 Views Right Result Date: 09/25/2023 CLINICAL DATA:  Postop. EXAM: RIGHT FOOT - 2 VIEW COMPARISON:  Radiograph 03/01/2023 FINDINGS: Intervally PTA shin of the second toe at the base of the proximal phalanx. The resection margin is smooth. Expected postoperative changes in the overlying soft tissues. Previous resection of the great toe. Chronic mid/hindfoot arthropathy. IMPRESSION: Interval amputation of the second toe at the base of the proximal phalanx. Electronically Signed   By: Andrea Gasman M.D.   On: 09/25/2023 13:20   DG Foot 2 Views Left Result Date: 09/25/2023 CLINICAL DATA:  Postop. EXAM: LEFT FOOT - 2 VIEW COMPARISON:  Preoperative imaging FINDINGS: Interval transmetatarsal amputation of all 5 rays. There is section margins are smooth. Expected postoperative changes in the overlying Tekeya Geffert operative bed. Chronic midfoot arthropathy. 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 Electronically Signed   By: Andrea Gasman M.D.   On: 09/25/2023 13:19   MR FOOT LEFT WO CONTRAST Result Date: 09/25/2023 CLINICAL DATA:  Soft tissue infection suspected, foot, xray done r/o osteo EXAM: MRI OF THE LEFT FOOT WITHOUT CONTRAST TECHNIQUE: Multiplanar, multisequence MR imaging of the left forefoot was performed. No intravenous contrast was administered. COMPARISON:  Prior radiographs, most recently performed 09/22/2023. FINDINGS: Bones/Joint/Cartilage Postsurgical changes from remote amputation through the metatarsophalangeal joint of the great toe and base of the 2nd proximal phalanx with more recent amputation of the 3rd toe at the metatarsophalangeal joint. There is  heterogeneous T2 hyperintensity and mildly decreased T1 hyperintensity throughout the marrow of the 4th digit. Questionable mild cortical destruction of the proximal phalanx on the coronal T1 weighted images. In the appropriate clinical setting, these findings are concerning for osteomyelitis of the 4th toe. No other evidence of osteomyelitis, acute fracture or dislocation within the remaining forefoot. Probable mild degenerative signal within the 1st, 3rd and 4th metatarsal heads. Chronic ankylosis of the bases of the 1st through 3rd metatarsals and adjacent cuneiforms. No significant joint effusions. The collateral ligaments Ligaments The collateral ligaments of the remaining metatarsophalangeal joints appear intact. Muscles and Tendons Severe generalized muscular atrophy. Postsurgical findings in the tendons related to previous toe amputation. No significant tenosynovitis. Soft tissues Soft tissue swelling in the 4th toe with apparent skin ulceration dorsal to the proximal interphalangeal joint. No focal fluid collection or foreign body identified. Mild dorsal forefoot subcutaneous edema. IMPRESSION: 1. Soft tissue swelling in the 4th toe with apparent skin ulceration dorsal to the proximal interphalangeal joint. No focal fluid collection or foreign body identified. 2. Abnormal marrow signal throughout the 4th digit with questionable mild cortical destruction of the  proximal phalanx. In the appropriate clinical setting, these findings are concerning for osteomyelitis. 3. No other evidence of osteomyelitis, acute fracture or dislocation within the remaining forefoot. 4. Previous toe amputations as described. Chronic ankylosis of the bases of the 1st through 3rd metatarsals and adjacent cuneiforms. 5. Severe generalized muscular atrophy. Electronically Signed   By: Elsie Perone M.D.   On: 09/25/2023 08:47   DG Foot Complete Left Result Date: 09/22/2023 Please see detailed radiograph report in office  note.   Microbiology: Recent Results (from the past 240 hours)  WOUND CULTURE     Status: Abnormal   Collection Time: 09/22/23 12:00 AM   Specimen: Toe, Left; Wound  Result Value Ref Range Status   MICRO NUMBER: [id number]  Final   SPECIMEN QUALITY: Adequate  Final   SOURCE: LEFT 4TH TOE ULCER  Final   STATUS: FINAL  Final   GRAM STAIN:   Final    No white blood cells seen No epithelial cells seen Many Gram positive cocci in clusters   ISOLATE 1: Staphylococcus aureus (Maurina Fawaz)  Final    Comment: Heavy growth of Staphylococcus aureus This isolate demonstrates inducible clindamycin resistance.      Susceptibility   Staphylococcus aureus - AEROBIC CULT, GRAM STAIN POSITIVE 1    VANCOMYCIN  <=0.5 Sensitive     CIPROFLOXACIN <=0.5 Sensitive     CLINDAMYCIN NR Resistant     LEVOFLOXACIN 0.25 Sensitive     ERYTHROMYCIN >=8 Resistant     GENTAMICIN  <=0.5 Sensitive     OXACILLIN* <=0.25 Sensitive      * Oxacillin susceptible staphylococci are susceptible to other penicillinase-stable penicillins (e.g., methicillin, nafcillin), beta- lactam/beta-lactamase inhibitor combinations, and cephems with staphylococcal indications, including cefazolin .     TETRACYCLINE <=1 Sensitive     TRIMETH/SULFA <=10 Sensitive     MOXIFLOXACIN* <=0.25 Sensitive      * Oxacillin susceptible staphylococci are susceptible to other penicillinase-stable penicillins (e.g., methicillin, nafcillin), beta- lactam/beta-lactamase inhibitor combinations, and cephems with staphylococcal indications, including cefazolin . Legend: S = Susceptible  I = Intermediate R = Resistant  NS = Not susceptible SDD = Susceptible Dose Dependent * = Not Tested  NR = Not Reported **NN = See Therapy Comments   MRSA Next Gen by PCR, Nasal     Status: None   Collection Time: 09/25/23 12:54 AM   Specimen: Nasal Mucosa; Nasal Swab  Result Value Ref Range Status   MRSA by PCR Next Gen NOT DETECTED NOT DETECTED Final    Comment:  (NOTE) The GeneXpert MRSA Assay (FDA approved for NASAL specimens only), is one component of Leauna Sharber comprehensive MRSA colonization surveillance program. It is not intended to diagnose MRSA infection nor to guide or monitor treatment for MRSA infections. Test performance is not FDA approved in patients less than 54 years old. Performed at [facility], [address] [place], [place] [address]   Aerobic/Anaerobic Culture w Gram Stain (surgical/deep wound)     Status: None (Preliminary result)   Collection Time: 09/25/23  8:50 AM   Specimen: Foot, Left; Tissue  Result Value Ref Range Status   Specimen Description TISSUE  Final   Special Requests NONE  Final   Gram Stain   Final    NO WBC SEEN FEW GRAM POSITIVE COCCI Performed at [facility], [address] [place], [place] [address]    Culture   Final    ABUNDANT STAPHYLOCOCCUS AUREUS CULTURE REINCUBATED FOR BETTER GROWTH NO ANAEROBES ISOLATED; CULTURE IN PROGRESS FOR  5 DAYS    Report Status PENDING  Incomplete     Labs: Basic Metabolic Panel: Recent Labs  Lab 09/22/23 1732 09/23/23 0725 09/25/23 0436  NA 134* 136 136  K 4.8 4.2 4.6  CL 101 101 100  CO2 26 30 27   GLUCOSE 285* 90 271*  BUN 35* 30* 31*  CREATININE 1.85* 1.52* 1.55*  CALCIUM  8.6* 8.4* 8.2*   Liver Function Tests: Recent Labs  Lab 09/22/23 1732  AST 17  ALT 14  ALKPHOS 213*  BILITOT 0.9  PROT 7.1  ALBUMIN  2.4*   No results for input(s): LIPASE, AMYLASE in the last 168 hours. No results for input(s): AMMONIA in the last 168 hours. CBC: Recent Labs  Lab 09/22/23 1732 09/23/23 0725 09/25/23 0436  WBC 7.0 6.1 5.9  HGB 12.7* 12.1* 13.1  HCT 37.4* 36.0* 38.3*  MCV 100.0 99.7 98.7  PLT 53* 52* 49*   Cardiac Enzymes: No results for input(s): CKTOTAL, CKMB, CKMBINDEX, TROPONINI in the last 168 hours. BNP: BNP (last 3 results) No results for input(s): BNP in the last 8760 hours.  ProBNP (last 3 results) No results  for input(s): PROBNP in the last 8760 hours.  CBG: Recent Labs  Lab 09/26/23 1700 09/26/23 2103 09/27/23 0751 09/27/23 1146 09/27/23 1707  GLUCAP 202* 108* 196* 189* 250*       Signed:  Meliton Monte MD.  [facility] 09/27/2023, 7:28 PM

## 2023-09-27 NOTE — Progress Notes (Signed)
 Discharged order receive . AVS reviewed with the patient.Patient verbalizes understanding. Saline lock remove. Patient belongings with patient at time of discharge.Wife to transport patient home.

## 2023-09-27 NOTE — Progress Notes (Signed)
   Subjective: 2 Days Post-Op Procedure(s) (LRB): RIGHT SECOND TOE AMPUTATION (Right) AMPUTATION, FOOT, TRANSMETATARSAL (Left) DOS:   Objective: Vital signs in last 24 hours: Temp:  [97.5 F (36.4 C)-98.6 F (37 C)] 97.5 F (36.4 C) (08/20 1622) Pulse Rate:  [65-67] 67 (08/20 1622) Resp:  [16-18] 17 (08/20 1622) BP: (134-158)/(57-99) 134/84 (08/20 1622) SpO2:  [95 %-98 %] 98 % (08/20 1622)  Recent Labs    09/25/23 0436  HGB 13.1   Recent Labs    09/25/23 0436  WBC 5.9  RBC 3.88*  HCT 38.3*  PLT 49*   Recent Labs    09/25/23 0436  NA 136  K 4.6  CL 100  CO2 27  BUN 31*  CREATININE 1.55*  GLUCOSE 271*  CALCIUM  8.2*   No results for input(s): LABPT, INR in the last 72 hours.  Physical Exam: Slight mild bleeding drainage coming from the RT second toe amputation as well as TMA LT. Overall sutures and staples intact and incision well coapted. No purulence.   Assessment/Plan: 2 Days Post-Op Procedure(s) (LRB): RIGHT SECOND TOE AMPUTATION (Right) AMPUTATION, FOOT, TRANSMETATARSAL (Left) DOS: 09/25/2023  Seen at bedside. Dressings changed. Leave clean and dry until f/u in office 1 week. Rec dc abx x 7 days. Minimal WBAT surgical shoes transition purposes only. Will sign off.    Thresa CHRISTELLA Sar 09/27/2023, 6:58 PM  Thresa EMERSON Sar, DPM [facility]  Dr. Thresa EMERSON Sar, DPM    [address] [place], [place] [address]                Office [phone]  Fax [phone]

## 2023-09-27 NOTE — Progress Notes (Signed)
 ABI has been completed.   Results can be found under chart review under CV PROC. 09/27/2023 3:52 PM Tyyonna Soucy RVT, RDMS

## 2023-09-28 ENCOUNTER — Other Ambulatory Visit (HOSPITAL_BASED_OUTPATIENT_CLINIC_OR_DEPARTMENT_OTHER): Payer: Self-pay

## 2023-09-30 LAB — AEROBIC/ANAEROBIC CULTURE W GRAM STAIN (SURGICAL/DEEP WOUND): Gram Stain: NONE SEEN

## 2023-10-02 ENCOUNTER — Other Ambulatory Visit (HOSPITAL_BASED_OUTPATIENT_CLINIC_OR_DEPARTMENT_OTHER): Payer: Self-pay

## 2023-10-02 DIAGNOSIS — E1042 Type 1 diabetes mellitus with diabetic polyneuropathy: Secondary | ICD-10-CM | POA: Diagnosis not present

## 2023-10-02 DIAGNOSIS — E1022 Type 1 diabetes mellitus with diabetic chronic kidney disease: Secondary | ICD-10-CM | POA: Diagnosis not present

## 2023-10-02 DIAGNOSIS — G35 Multiple sclerosis: Secondary | ICD-10-CM | POA: Diagnosis not present

## 2023-10-02 DIAGNOSIS — Z4781 Encounter for orthopedic aftercare following surgical amputation: Secondary | ICD-10-CM | POA: Diagnosis not present

## 2023-10-02 DIAGNOSIS — E859 Amyloidosis, unspecified: Secondary | ICD-10-CM | POA: Diagnosis not present

## 2023-10-02 DIAGNOSIS — L89322 Pressure ulcer of left buttock, stage 2: Secondary | ICD-10-CM | POA: Diagnosis not present

## 2023-10-02 DIAGNOSIS — N1831 Chronic kidney disease, stage 3a: Secondary | ICD-10-CM | POA: Diagnosis not present

## 2023-10-02 DIAGNOSIS — E1059 Type 1 diabetes mellitus with other circulatory complications: Secondary | ICD-10-CM | POA: Diagnosis not present

## 2023-10-02 DIAGNOSIS — G894 Chronic pain syndrome: Secondary | ICD-10-CM | POA: Diagnosis not present

## 2023-10-02 DIAGNOSIS — I2510 Atherosclerotic heart disease of native coronary artery without angina pectoris: Secondary | ICD-10-CM | POA: Diagnosis not present

## 2023-10-02 DIAGNOSIS — I129 Hypertensive chronic kidney disease with stage 1 through stage 4 chronic kidney disease, or unspecified chronic kidney disease: Secondary | ICD-10-CM | POA: Diagnosis not present

## 2023-10-02 DIAGNOSIS — J449 Chronic obstructive pulmonary disease, unspecified: Secondary | ICD-10-CM | POA: Diagnosis not present

## 2023-10-02 DIAGNOSIS — I69354 Hemiplegia and hemiparesis following cerebral infarction affecting left non-dominant side: Secondary | ICD-10-CM | POA: Diagnosis not present

## 2023-10-03 ENCOUNTER — Ambulatory Visit (INDEPENDENT_AMBULATORY_CARE_PROVIDER_SITE_OTHER): Admitting: Podiatry

## 2023-10-03 ENCOUNTER — Encounter: Payer: Self-pay | Admitting: Podiatry

## 2023-10-03 DIAGNOSIS — M86172 Other acute osteomyelitis, left ankle and foot: Secondary | ICD-10-CM

## 2023-10-03 DIAGNOSIS — M2061 Acquired deformities of toe(s), unspecified, right foot: Secondary | ICD-10-CM

## 2023-10-03 DIAGNOSIS — Z9889 Other specified postprocedural states: Secondary | ICD-10-CM

## 2023-10-03 NOTE — Progress Notes (Signed)
  Subjective:  Patient ID: [name] CHRISTELLA Arch Mickey., male    DOB: September 09, 1945,  MRN: [id number]  Chief Complaint  Patient presents with   Wound Check    Transmetatarsal amputation of LEFT foot Amputation of 2nd toe at MPJ level, RIGHT foot. 8 pain. IDDM A1C 6.4. No dressing changes. Wearing surgical shoes bilateral.     DOS: 09/25/23 Procedure: 1. Transmetatarsal amputation of LEFT foot 2. Amputation of 2nd toe at MPJ level, RIGHT foot  78 y.o. male seen for post op check.  Patient reports he is doing well pain is overall controlled.  Has left dressings clean dry and intact since the hospital.  Has been trying to stay off the left foot is much as able weightbearing in the right foot.  Was some bleeding on the right foot but it is stopped.  Review of Systems: Negative except as noted in the HPI. Denies N/V/F/Ch.   Objective:   Constitutional Well developed. Well nourished.  Vascular Foot warm and well perfused. Capillary refill normal to all digits.   No calf pain with palpation  Neurologic Normal speech. Oriented to person, place, and time. Epicritic sensation intact  Dermatologic Left foot transmetatarsal amputation site well coapted no drainage or dehiscence, though does have some bruising and edema present Right foot second toe amputation site well coapted minimal eschar minimal edema looking very healthy  Orthopedic: S/p L foot TMA, R foot 2nd toe amp at 2nd proximal phalanx base level   Radiographs: XR R foot:  Interval amputation of the second toe at the base of the proximal phalanx. XR L foot: Interval transmetatarsal amputation of all 5 rays.   Pathology:  FINAL MICROSCOPIC DIAGNOSIS:   A. TOE, RIGHT SECOND, AMPUTATION:  - Benign bone with chronic inflammation  - Margin appears viable   B. FOOT, LEFT, TRANSMETATARSAL AMPUTATION:  - Benign bone with acute inflammation  - Margin appears viable   Micro: ABUNDANT STAPHYLOCOCCUS AUREUS - MSSA  Assessment:   1. Acute  osteomyelitis of toe, left (HCC)   2. Acquired deformity of toe, right   3. Post-operative state    Osteomyelitis L fourth toe, non functional right 2nd toe with flexion deformity S/p R 2nd toe amputation, Left foot transmetatarsal amputation  Plan:  Patient was evaluated and treated and all questions answered.  1 week s/p L foot TMA, R 2nd toe amputation  -Progressing as expected post op, amputation site well coapted on left and right foot overall improving and healing as expected no dehiscence or evidence of residual infection -XR: expected post op changes -WB Status: WBAT in post op shoe to right foot, NWB to L foot in post op shoe for protection -Sutures: remain intact. -Medications/ABX: Status post 5 days Augmentin  monitor off antibiotics at this time -Dressing: Dressing changed today bilateral foot with Xeroform 4 x 4 gauze Kerlix and Ace roll.  Provided verbal order via telephone during the visit to the patient's home health RN for twice weekly dressing changes to the bilateral foot with Xeroform 4 x 4 gauze Kerlix and Ace roll to secure. - F/u Plan: Follow-up in 2 weeks        [name], DPM [facility] / [facility]

## 2023-10-05 DIAGNOSIS — E1059 Type 1 diabetes mellitus with other circulatory complications: Secondary | ICD-10-CM | POA: Diagnosis not present

## 2023-10-05 DIAGNOSIS — Z4781 Encounter for orthopedic aftercare following surgical amputation: Secondary | ICD-10-CM | POA: Diagnosis not present

## 2023-10-05 DIAGNOSIS — E1022 Type 1 diabetes mellitus with diabetic chronic kidney disease: Secondary | ICD-10-CM | POA: Diagnosis not present

## 2023-10-05 DIAGNOSIS — L89322 Pressure ulcer of left buttock, stage 2: Secondary | ICD-10-CM | POA: Diagnosis not present

## 2023-10-05 DIAGNOSIS — I129 Hypertensive chronic kidney disease with stage 1 through stage 4 chronic kidney disease, or unspecified chronic kidney disease: Secondary | ICD-10-CM | POA: Diagnosis not present

## 2023-10-05 DIAGNOSIS — N1831 Chronic kidney disease, stage 3a: Secondary | ICD-10-CM | POA: Diagnosis not present

## 2023-10-11 DIAGNOSIS — E1059 Type 1 diabetes mellitus with other circulatory complications: Secondary | ICD-10-CM | POA: Diagnosis not present

## 2023-10-11 DIAGNOSIS — E1022 Type 1 diabetes mellitus with diabetic chronic kidney disease: Secondary | ICD-10-CM | POA: Diagnosis not present

## 2023-10-11 DIAGNOSIS — Z4781 Encounter for orthopedic aftercare following surgical amputation: Secondary | ICD-10-CM | POA: Diagnosis not present

## 2023-10-11 DIAGNOSIS — I129 Hypertensive chronic kidney disease with stage 1 through stage 4 chronic kidney disease, or unspecified chronic kidney disease: Secondary | ICD-10-CM | POA: Diagnosis not present

## 2023-10-11 DIAGNOSIS — L89322 Pressure ulcer of left buttock, stage 2: Secondary | ICD-10-CM | POA: Diagnosis not present

## 2023-10-11 DIAGNOSIS — N1831 Chronic kidney disease, stage 3a: Secondary | ICD-10-CM | POA: Diagnosis not present

## 2023-10-13 DIAGNOSIS — E1059 Type 1 diabetes mellitus with other circulatory complications: Secondary | ICD-10-CM | POA: Diagnosis not present

## 2023-10-13 DIAGNOSIS — L89322 Pressure ulcer of left buttock, stage 2: Secondary | ICD-10-CM | POA: Diagnosis not present

## 2023-10-13 DIAGNOSIS — I129 Hypertensive chronic kidney disease with stage 1 through stage 4 chronic kidney disease, or unspecified chronic kidney disease: Secondary | ICD-10-CM | POA: Diagnosis not present

## 2023-10-13 DIAGNOSIS — N1831 Chronic kidney disease, stage 3a: Secondary | ICD-10-CM | POA: Diagnosis not present

## 2023-10-13 DIAGNOSIS — Z4781 Encounter for orthopedic aftercare following surgical amputation: Secondary | ICD-10-CM | POA: Diagnosis not present

## 2023-10-13 DIAGNOSIS — E1022 Type 1 diabetes mellitus with diabetic chronic kidney disease: Secondary | ICD-10-CM | POA: Diagnosis not present

## 2023-10-14 DIAGNOSIS — J449 Chronic obstructive pulmonary disease, unspecified: Secondary | ICD-10-CM | POA: Diagnosis not present

## 2023-10-14 DIAGNOSIS — E1042 Type 1 diabetes mellitus with diabetic polyneuropathy: Secondary | ICD-10-CM | POA: Diagnosis not present

## 2023-10-14 DIAGNOSIS — E1022 Type 1 diabetes mellitus with diabetic chronic kidney disease: Secondary | ICD-10-CM | POA: Diagnosis not present

## 2023-10-14 DIAGNOSIS — I2510 Atherosclerotic heart disease of native coronary artery without angina pectoris: Secondary | ICD-10-CM | POA: Diagnosis not present

## 2023-10-14 DIAGNOSIS — E785 Hyperlipidemia, unspecified: Secondary | ICD-10-CM | POA: Diagnosis not present

## 2023-10-14 DIAGNOSIS — Z4781 Encounter for orthopedic aftercare following surgical amputation: Secondary | ICD-10-CM | POA: Diagnosis not present

## 2023-10-14 DIAGNOSIS — L89322 Pressure ulcer of left buttock, stage 2: Secondary | ICD-10-CM | POA: Diagnosis not present

## 2023-10-14 DIAGNOSIS — G894 Chronic pain syndrome: Secondary | ICD-10-CM | POA: Diagnosis not present

## 2023-10-14 DIAGNOSIS — I129 Hypertensive chronic kidney disease with stage 1 through stage 4 chronic kidney disease, or unspecified chronic kidney disease: Secondary | ICD-10-CM | POA: Diagnosis not present

## 2023-10-14 DIAGNOSIS — E1059 Type 1 diabetes mellitus with other circulatory complications: Secondary | ICD-10-CM | POA: Diagnosis not present

## 2023-10-14 DIAGNOSIS — I69354 Hemiplegia and hemiparesis following cerebral infarction affecting left non-dominant side: Secondary | ICD-10-CM | POA: Diagnosis not present

## 2023-10-14 DIAGNOSIS — Z794 Long term (current) use of insulin: Secondary | ICD-10-CM | POA: Diagnosis not present

## 2023-10-14 DIAGNOSIS — D696 Thrombocytopenia, unspecified: Secondary | ICD-10-CM | POA: Diagnosis not present

## 2023-10-14 DIAGNOSIS — G35 Multiple sclerosis: Secondary | ICD-10-CM | POA: Diagnosis not present

## 2023-10-14 DIAGNOSIS — Z7982 Long term (current) use of aspirin: Secondary | ICD-10-CM | POA: Diagnosis not present

## 2023-10-14 DIAGNOSIS — Z89422 Acquired absence of other left toe(s): Secondary | ICD-10-CM | POA: Diagnosis not present

## 2023-10-14 DIAGNOSIS — Z9981 Dependence on supplemental oxygen: Secondary | ICD-10-CM | POA: Diagnosis not present

## 2023-10-14 DIAGNOSIS — I252 Old myocardial infarction: Secondary | ICD-10-CM | POA: Diagnosis not present

## 2023-10-14 DIAGNOSIS — F329 Major depressive disorder, single episode, unspecified: Secondary | ICD-10-CM | POA: Diagnosis not present

## 2023-10-14 DIAGNOSIS — H3530 Unspecified macular degeneration: Secondary | ICD-10-CM | POA: Diagnosis not present

## 2023-10-14 DIAGNOSIS — Z556 Problems related to health literacy: Secondary | ICD-10-CM | POA: Diagnosis not present

## 2023-10-14 DIAGNOSIS — E538 Deficiency of other specified B group vitamins: Secondary | ICD-10-CM | POA: Diagnosis not present

## 2023-10-14 DIAGNOSIS — N1831 Chronic kidney disease, stage 3a: Secondary | ICD-10-CM | POA: Diagnosis not present

## 2023-10-14 DIAGNOSIS — Z7902 Long term (current) use of antithrombotics/antiplatelets: Secondary | ICD-10-CM | POA: Diagnosis not present

## 2023-10-14 DIAGNOSIS — E859 Amyloidosis, unspecified: Secondary | ICD-10-CM | POA: Diagnosis not present

## 2023-10-17 ENCOUNTER — Ambulatory Visit

## 2023-10-17 ENCOUNTER — Ambulatory Visit (INDEPENDENT_AMBULATORY_CARE_PROVIDER_SITE_OTHER): Admitting: Podiatry

## 2023-10-17 DIAGNOSIS — M86172 Other acute osteomyelitis, left ankle and foot: Secondary | ICD-10-CM

## 2023-10-17 DIAGNOSIS — Z9889 Other specified postprocedural states: Secondary | ICD-10-CM

## 2023-10-17 DIAGNOSIS — M2061 Acquired deformities of toe(s), unspecified, right foot: Secondary | ICD-10-CM

## 2023-10-17 NOTE — Progress Notes (Signed)
 Subjective:  Patient ID: [name]., male    DOB: [birth year],  MRN: [id number]  Chief Complaint  Patient presents with   Osteomyelitis     Left foot, wearing his normal shoes, dressing is intact. Staples and sutures in place. There is drainage and some open area along the surgical site around the staples.   Right foot is intact. Sutures in place.     DOS: 09/25/23 Procedure: 1. Transmetatarsal amputation of LEFT foot 2. Amputation of 2nd toe at MPJ level, RIGHT foot  78 y.o. male seen for post op check.   Now 3 weeks after above procedures.  Course of the left foot has been bleeding some.  Has been getting twice weekly dressing changes per home health RN.  Sutures and staples are intact.  Right foot doing well no issues there.  Wearing regular shoe gear.  Review of Systems: Negative except as noted in the HPI. Denies N/V/F/Ch.   Objective:   Constitutional Well developed. Well nourished.  Vascular Foot warm and well perfused. Capillary refill normal to all digits.   No calf pain with palpation  Neurologic Normal speech. Oriented to person, place, and time. Epicritic sensation intact  Dermatologic Left foot transmetatarsal amputation site overall well coapted there are small areas of superficial dehiscence and eschar along the amputation site though relatively appears improved from prior.  No active bleeding noted.  Right second toe amputation site appears well-healed at this time no dehiscence residual infection or erythema.  Dried blood eschar overlying the incision line.    Orthopedic: S/p L foot TMA, R foot 2nd toe amp at 2nd proximal phalanx base level   Radiographs: XR R foot:  Interval amputation of the second toe at the base of the proximal phalanx. XR L foot: Interval transmetatarsal amputation of all 5 rays.   Pathology:  FINAL MICROSCOPIC DIAGNOSIS:   A. TOE, RIGHT SECOND, AMPUTATION:  - Benign bone with chronic inflammation  - Margin appears viable   B.  FOOT, LEFT, TRANSMETATARSAL AMPUTATION:  - Benign bone with acute inflammation  - Margin appears viable   Micro: ABUNDANT STAPHYLOCOCCUS AUREUS - MSSA  Assessment:   1. Acute osteomyelitis of toe, left (HCC)   2. Acquired deformity of toe, right   3. Post-operative state     Osteomyelitis L fourth toe, non functional right 2nd toe with flexion deformity S/p R 2nd toe amputation, Left foot transmetatarsal amputation  Plan:  Patient was evaluated and treated and all questions answered.  2 week s/p L foot TMA, R 2nd toe amputation  -Progressing as expected post op, right foot amputation site appears to be fully healed at this time -Left foot amputation site with superficial dehiscence though no gross dehiscence of the TMA.  Will need continued monitoring and dressing changes on the left foot. -Continue with twice weekly dry gauze dressing changes to left foot with Xeroform contact layer -XR: expected post op changes -WB Status: Weightbearing as tolerated regular shoe on the right foot, nonweightbearing in postop shoe on the left foot -Sutures: Removed sutures from both feet the left staples intact on the left foot TMA site -Medications/ABX: No indication for antibiotics at this time -Dressing: Applied antibiotic ointment and Band-Aid dressing to the right second toe amputation site.  Applied Xeroform 4 x 4 Kerlix Ace dressing to the left foot.  - F/u Plan: Follow-up in 3 weeks        Marolyn JULIANNA Honour, DPM [facility] / [facility]

## 2023-10-19 DIAGNOSIS — Z4781 Encounter for orthopedic aftercare following surgical amputation: Secondary | ICD-10-CM | POA: Diagnosis not present

## 2023-10-19 DIAGNOSIS — E1059 Type 1 diabetes mellitus with other circulatory complications: Secondary | ICD-10-CM | POA: Diagnosis not present

## 2023-10-19 DIAGNOSIS — I129 Hypertensive chronic kidney disease with stage 1 through stage 4 chronic kidney disease, or unspecified chronic kidney disease: Secondary | ICD-10-CM | POA: Diagnosis not present

## 2023-10-19 DIAGNOSIS — N1831 Chronic kidney disease, stage 3a: Secondary | ICD-10-CM | POA: Diagnosis not present

## 2023-10-19 DIAGNOSIS — E1022 Type 1 diabetes mellitus with diabetic chronic kidney disease: Secondary | ICD-10-CM | POA: Diagnosis not present

## 2023-10-19 DIAGNOSIS — L89322 Pressure ulcer of left buttock, stage 2: Secondary | ICD-10-CM | POA: Diagnosis not present

## 2023-10-22 ENCOUNTER — Encounter (HOSPITAL_COMMUNITY): Payer: Self-pay

## 2023-10-22 ENCOUNTER — Inpatient Hospital Stay (HOSPITAL_COMMUNITY)
Admission: EM | Admit: 2023-10-22 | Discharge: 2023-10-24 | DRG: 392 | Disposition: A | Discharge status: Home Health | Attending: Family Medicine | Admitting: Family Medicine

## 2023-10-22 ENCOUNTER — Emergency Department (HOSPITAL_COMMUNITY)

## 2023-10-22 DIAGNOSIS — K5649 Other impaction of intestine: Secondary | ICD-10-CM | POA: Diagnosis present

## 2023-10-22 DIAGNOSIS — I10 Essential (primary) hypertension: Secondary | ICD-10-CM | POA: Diagnosis present

## 2023-10-22 DIAGNOSIS — Z66 Do not resuscitate: Secondary | ICD-10-CM | POA: Diagnosis present

## 2023-10-22 DIAGNOSIS — T402X5A Adverse effect of other opioids, initial encounter: Secondary | ICD-10-CM | POA: Diagnosis present

## 2023-10-22 DIAGNOSIS — Z794 Long term (current) use of insulin: Secondary | ICD-10-CM | POA: Diagnosis not present

## 2023-10-22 DIAGNOSIS — J449 Chronic obstructive pulmonary disease, unspecified: Secondary | ICD-10-CM | POA: Diagnosis present

## 2023-10-22 DIAGNOSIS — K5900 Constipation, unspecified: Secondary | ICD-10-CM | POA: Diagnosis not present

## 2023-10-22 DIAGNOSIS — Z955 Presence of coronary angioplasty implant and graft: Secondary | ICD-10-CM

## 2023-10-22 DIAGNOSIS — F419 Anxiety disorder, unspecified: Secondary | ICD-10-CM | POA: Diagnosis present

## 2023-10-22 DIAGNOSIS — N400 Enlarged prostate without lower urinary tract symptoms: Secondary | ICD-10-CM | POA: Diagnosis present

## 2023-10-22 DIAGNOSIS — I129 Hypertensive chronic kidney disease with stage 1 through stage 4 chronic kidney disease, or unspecified chronic kidney disease: Secondary | ICD-10-CM | POA: Diagnosis present

## 2023-10-22 DIAGNOSIS — K5903 Drug induced constipation: Secondary | ICD-10-CM | POA: Diagnosis present

## 2023-10-22 DIAGNOSIS — K5289 Other specified noninfective gastroenteritis and colitis: Principal | ICD-10-CM | POA: Diagnosis present

## 2023-10-22 DIAGNOSIS — Z79899 Other long term (current) drug therapy: Secondary | ICD-10-CM

## 2023-10-22 DIAGNOSIS — E1042 Type 1 diabetes mellitus with diabetic polyneuropathy: Secondary | ICD-10-CM | POA: Diagnosis present

## 2023-10-22 DIAGNOSIS — E108 Type 1 diabetes mellitus with unspecified complications: Secondary | ICD-10-CM | POA: Diagnosis present

## 2023-10-22 DIAGNOSIS — Z89422 Acquired absence of other left toe(s): Secondary | ICD-10-CM

## 2023-10-22 DIAGNOSIS — G894 Chronic pain syndrome: Secondary | ICD-10-CM | POA: Diagnosis present

## 2023-10-22 DIAGNOSIS — L89152 Pressure ulcer of sacral region, stage 2: Secondary | ICD-10-CM | POA: Diagnosis present

## 2023-10-22 DIAGNOSIS — E10610 Type 1 diabetes mellitus with diabetic neuropathic arthropathy: Secondary | ICD-10-CM | POA: Diagnosis present

## 2023-10-22 DIAGNOSIS — G35 Multiple sclerosis: Secondary | ICD-10-CM | POA: Diagnosis present

## 2023-10-22 DIAGNOSIS — E785 Hyperlipidemia, unspecified: Secondary | ICD-10-CM | POA: Diagnosis present

## 2023-10-22 DIAGNOSIS — F418 Other specified anxiety disorders: Secondary | ICD-10-CM | POA: Diagnosis present

## 2023-10-22 DIAGNOSIS — F32A Depression, unspecified: Secondary | ICD-10-CM | POA: Diagnosis present

## 2023-10-22 DIAGNOSIS — E1051 Type 1 diabetes mellitus with diabetic peripheral angiopathy without gangrene: Secondary | ICD-10-CM | POA: Diagnosis present

## 2023-10-22 DIAGNOSIS — K6289 Other specified diseases of anus and rectum: Secondary | ICD-10-CM | POA: Diagnosis present

## 2023-10-22 DIAGNOSIS — K529 Noninfective gastroenteritis and colitis, unspecified: Secondary | ICD-10-CM | POA: Diagnosis not present

## 2023-10-22 DIAGNOSIS — N1831 Chronic kidney disease, stage 3a: Secondary | ICD-10-CM | POA: Diagnosis present

## 2023-10-22 DIAGNOSIS — D696 Thrombocytopenia, unspecified: Secondary | ICD-10-CM | POA: Diagnosis present

## 2023-10-22 DIAGNOSIS — I2510 Atherosclerotic heart disease of native coronary artery without angina pectoris: Secondary | ICD-10-CM | POA: Diagnosis present

## 2023-10-22 DIAGNOSIS — Z7902 Long term (current) use of antithrombotics/antiplatelets: Secondary | ICD-10-CM | POA: Diagnosis not present

## 2023-10-22 DIAGNOSIS — Z8042 Family history of malignant neoplasm of prostate: Secondary | ICD-10-CM

## 2023-10-22 DIAGNOSIS — N281 Cyst of kidney, acquired: Secondary | ICD-10-CM | POA: Diagnosis not present

## 2023-10-22 DIAGNOSIS — Z89411 Acquired absence of right great toe: Secondary | ICD-10-CM

## 2023-10-22 DIAGNOSIS — K8689 Other specified diseases of pancreas: Secondary | ICD-10-CM | POA: Diagnosis not present

## 2023-10-22 DIAGNOSIS — I739 Peripheral vascular disease, unspecified: Secondary | ICD-10-CM | POA: Diagnosis present

## 2023-10-22 DIAGNOSIS — E1022 Type 1 diabetes mellitus with diabetic chronic kidney disease: Secondary | ICD-10-CM | POA: Diagnosis present

## 2023-10-22 DIAGNOSIS — Z8249 Family history of ischemic heart disease and other diseases of the circulatory system: Secondary | ICD-10-CM

## 2023-10-22 DIAGNOSIS — Z7982 Long term (current) use of aspirin: Secondary | ICD-10-CM

## 2023-10-22 DIAGNOSIS — E1142 Type 2 diabetes mellitus with diabetic polyneuropathy: Secondary | ICD-10-CM | POA: Diagnosis present

## 2023-10-22 DIAGNOSIS — Z87891 Personal history of nicotine dependence: Secondary | ICD-10-CM

## 2023-10-22 DIAGNOSIS — I69354 Hemiplegia and hemiparesis following cerebral infarction affecting left non-dominant side: Secondary | ICD-10-CM | POA: Diagnosis not present

## 2023-10-22 DIAGNOSIS — I252 Old myocardial infarction: Secondary | ICD-10-CM

## 2023-10-22 DIAGNOSIS — Z89421 Acquired absence of other right toe(s): Secondary | ICD-10-CM

## 2023-10-22 DIAGNOSIS — N3289 Other specified disorders of bladder: Secondary | ICD-10-CM | POA: Diagnosis not present

## 2023-10-22 DIAGNOSIS — G250 Essential tremor: Secondary | ICD-10-CM | POA: Diagnosis present

## 2023-10-22 DIAGNOSIS — Z8379 Family history of other diseases of the digestive system: Secondary | ICD-10-CM

## 2023-10-22 DIAGNOSIS — Z8673 Personal history of transient ischemic attack (TIA), and cerebral infarction without residual deficits: Secondary | ICD-10-CM

## 2023-10-22 LAB — CBC WITH DIFFERENTIAL/PLATELET
Abs Immature Granulocytes: 0.06 K/uL (ref 0.00–0.07)
Basophils Absolute: 0.1 K/uL (ref 0.0–0.1)
Basophils Relative: 0 %
Eosinophils Absolute: 0.1 K/uL (ref 0.0–0.5)
Eosinophils Relative: 1 %
HCT: 41.9 % (ref 39.0–52.0)
Hemoglobin: 13.5 g/dL (ref 13.0–17.0)
Immature Granulocytes: 1 %
Lymphocytes Relative: 7 %
Lymphs Abs: 0.9 K/uL (ref 0.7–4.0)
MCH: 33.1 pg (ref 26.0–34.0)
MCHC: 32.2 g/dL (ref 30.0–36.0)
MCV: 102.7 fL — ABNORMAL HIGH (ref 80.0–100.0)
Monocytes Absolute: 0.6 K/uL (ref 0.1–1.0)
Monocytes Relative: 5 %
Neutro Abs: 10.7 K/uL — ABNORMAL HIGH (ref 1.7–7.7)
Neutrophils Relative %: 86 %
Platelets: 54 K/uL — ABNORMAL LOW (ref 150–400)
RBC: 4.08 MIL/uL — ABNORMAL LOW (ref 4.22–5.81)
RDW: 13.2 % (ref 11.5–15.5)
WBC: 12.3 K/uL — ABNORMAL HIGH (ref 4.0–10.5)
nRBC: 0 % (ref 0.0–0.2)

## 2023-10-22 LAB — CBG MONITORING, ED: Glucose-Capillary: 246 mg/dL — ABNORMAL HIGH (ref 70–99)

## 2023-10-22 LAB — BASIC METABOLIC PANEL WITH GFR
Anion gap: 15 (ref 5–15)
BUN: 23 mg/dL (ref 8–23)
CO2: 25 mmol/L (ref 22–32)
Calcium: 8.8 mg/dL — ABNORMAL LOW (ref 8.9–10.3)
Chloride: 96 mmol/L — ABNORMAL LOW (ref 98–111)
Creatinine, Ser: 1.48 mg/dL — ABNORMAL HIGH (ref 0.61–1.24)
GFR, Estimated: 49 mL/min — ABNORMAL LOW (ref >60)
Glucose, Bld: 264 mg/dL — ABNORMAL HIGH (ref 70–99)
Potassium: 4.5 mmol/L (ref 3.5–5.1)
Sodium: 136 mmol/L (ref 135–145)

## 2023-10-22 MED ORDER — SODIUM CHLORIDE 0.9 % IV SOLN
2.0000 g | INTRAVENOUS | Status: DC
  Administered 2023-10-23 – 2023-10-24 (×2): 2 g via INTRAVENOUS
  Filled 2023-10-22 (×2): qty 20

## 2023-10-22 MED ORDER — DULOXETINE HCL 30 MG PO CPEP
60.0000 mg | ORAL_CAPSULE | Freq: Two times a day (BID) | ORAL | Status: DC
  Administered 2023-10-23 – 2023-10-24 (×3): 60 mg via ORAL
  Filled 2023-10-22 (×3): qty 2

## 2023-10-22 MED ORDER — SENNOSIDES-DOCUSATE SODIUM 8.6-50 MG PO TABS
2.0000 | ORAL_TABLET | Freq: Two times a day (BID) | ORAL | Status: DC
  Administered 2023-10-23 (×2): 2 via ORAL
  Filled 2023-10-22 (×4): qty 2

## 2023-10-22 MED ORDER — ASPIRIN 81 MG PO TBEC
81.0000 mg | DELAYED_RELEASE_TABLET | Freq: Every day | ORAL | Status: DC
  Administered 2023-10-23 – 2023-10-24 (×2): 81 mg via ORAL
  Filled 2023-10-22 (×2): qty 1

## 2023-10-22 MED ORDER — CLOPIDOGREL BISULFATE 75 MG PO TABS
75.0000 mg | ORAL_TABLET | Freq: Every day | ORAL | Status: DC
  Administered 2023-10-23 – 2023-10-24 (×2): 75 mg via ORAL
  Filled 2023-10-22 (×2): qty 1

## 2023-10-22 MED ORDER — ACETAMINOPHEN 325 MG PO TABS: 650.0000 mg | ORAL_TABLET | Freq: Four times a day (QID) | ORAL | Status: DC | PRN

## 2023-10-22 MED ORDER — INSULIN ASPART 100 UNIT/ML IJ SOLN
0.0000 [IU] | Freq: Three times a day (TID) | INTRAMUSCULAR | Status: DC
  Administered 2023-10-23: 2 [IU] via SUBCUTANEOUS
  Administered 2023-10-23: 8 [IU] via SUBCUTANEOUS
  Administered 2023-10-23 – 2023-10-24 (×3): 3 [IU] via SUBCUTANEOUS

## 2023-10-22 MED ORDER — TAPENTADOL HCL ER 50 MG PO TB12
200.0000 mg | ORAL_TABLET | Freq: Two times a day (BID) | ORAL | Status: DC
  Administered 2023-10-23 – 2023-10-24 (×3): 200 mg via ORAL
  Filled 2023-10-22 (×3): qty 4

## 2023-10-22 MED ORDER — ATORVASTATIN CALCIUM 10 MG PO TABS
10.0000 mg | ORAL_TABLET | Freq: Every day | ORAL | Status: DC
  Administered 2023-10-23: 10 mg via ORAL
  Filled 2023-10-22: qty 1

## 2023-10-22 MED ORDER — IOHEXOL 300 MG/ML  SOLN
100.0000 mL | Freq: Once | INTRAMUSCULAR | Status: AC | PRN
  Administered 2023-10-22: 100 mL via INTRAVENOUS

## 2023-10-22 MED ORDER — ACETAMINOPHEN 650 MG RE SUPP: 650.0000 mg | Freq: Four times a day (QID) | RECTAL | Status: DC | PRN

## 2023-10-22 MED ORDER — TRAZODONE HCL 100 MG PO TABS
150.0000 mg | ORAL_TABLET | Freq: Every day | ORAL | Status: DC
  Administered 2023-10-23 (×2): 150 mg via ORAL
  Filled 2023-10-22 (×2): qty 2

## 2023-10-22 MED ORDER — AMLODIPINE BESYLATE 5 MG PO TABS
5.0000 mg | ORAL_TABLET | Freq: Every morning | ORAL | Status: DC
  Administered 2023-10-23 – 2023-10-24 (×2): 5 mg via ORAL
  Filled 2023-10-22 (×2): qty 1

## 2023-10-22 MED ORDER — ONDANSETRON HCL 4 MG/2ML IJ SOLN: 4.0000 mg | Freq: Four times a day (QID) | INTRAMUSCULAR | Status: DC | PRN

## 2023-10-22 MED ORDER — INSULIN ASPART 100 UNIT/ML IJ SOLN
0.0000 [IU] | Freq: Every day | INTRAMUSCULAR | Status: DC
  Administered 2023-10-23: 3 [IU] via SUBCUTANEOUS

## 2023-10-22 MED ORDER — FINASTERIDE 5 MG PO TABS
5.0000 mg | ORAL_TABLET | Freq: Every day | ORAL | Status: DC
  Administered 2023-10-23 – 2023-10-24 (×2): 5 mg via ORAL
  Filled 2023-10-22 (×2): qty 1

## 2023-10-22 MED ORDER — OXYCODONE HCL 5 MG PO TABS
15.0000 mg | ORAL_TABLET | Freq: Four times a day (QID) | ORAL | Status: DC | PRN
  Administered 2023-10-23 – 2023-10-24 (×4): 15 mg via ORAL
  Filled 2023-10-22 (×4): qty 3

## 2023-10-22 MED ORDER — SODIUM CHLORIDE 0.9 % IV BOLUS
500.0000 mL | Freq: Once | INTRAVENOUS | Status: AC
  Administered 2023-10-22: 500 mL via INTRAVENOUS

## 2023-10-22 MED ORDER — SMOG ENEMA
960.0000 mL | Freq: Once | RECTAL | Status: AC
  Administered 2023-10-23: 960 mL via RECTAL
  Filled 2023-10-22: qty 960

## 2023-10-22 MED ORDER — BISACODYL 10 MG RE SUPP
10.0000 mg | Freq: Every day | RECTAL | Status: DC | PRN
  Administered 2023-10-23: 10 mg via RECTAL
  Filled 2023-10-22: qty 1

## 2023-10-22 MED ORDER — METOPROLOL TARTRATE 25 MG PO TABS
25.0000 mg | ORAL_TABLET | Freq: Two times a day (BID) | ORAL | Status: DC
  Administered 2023-10-23 – 2023-10-24 (×3): 25 mg via ORAL
  Filled 2023-10-22 (×3): qty 1

## 2023-10-22 MED ORDER — ONDANSETRON HCL 4 MG PO TABS: 4.0000 mg | ORAL_TABLET | Freq: Four times a day (QID) | ORAL | Status: DC | PRN

## 2023-10-22 MED ORDER — FENTANYL CITRATE PF 50 MCG/ML IJ SOSY
50.0000 ug | PREFILLED_SYRINGE | Freq: Once | INTRAMUSCULAR | Status: AC
  Administered 2023-10-22: 50 ug via INTRAVENOUS
  Filled 2023-10-22: qty 1

## 2023-10-22 MED ORDER — ALBUTEROL SULFATE (2.5 MG/3ML) 0.083% IN NEBU: 2.5000 mg | INHALATION_SOLUTION | Freq: Four times a day (QID) | RESPIRATORY_TRACT | Status: DC | PRN

## 2023-10-22 MED ORDER — POLYETHYLENE GLYCOL 3350 17 G PO PACK
17.0000 g | PACK | Freq: Two times a day (BID) | ORAL | Status: DC
  Administered 2023-10-23 – 2023-10-24 (×4): 17 g via ORAL
  Filled 2023-10-22 (×4): qty 1

## 2023-10-22 MED ORDER — LACTATED RINGERS IV SOLN
INTRAVENOUS | Status: AC
Start: 2023-10-22 — End: 2023-10-23

## 2023-10-22 MED ORDER — METRONIDAZOLE 500 MG/100ML IV SOLN
500.0000 mg | Freq: Two times a day (BID) | INTRAVENOUS | Status: DC
  Administered 2023-10-23 – 2023-10-24 (×4): 500 mg via INTRAVENOUS
  Filled 2023-10-22 (×4): qty 100

## 2023-10-22 MED ORDER — MAGNESIUM HYDROXIDE 400 MG/5ML PO SUSP: 30.0000 mL | Freq: Every day | ORAL | Status: DC | PRN

## 2023-10-22 MED ORDER — INSULIN GLARGINE-YFGN 100 UNIT/ML ~~LOC~~ SOLN: 10.0000 [IU] | Freq: Every day | SUBCUTANEOUS | Status: DC

## 2023-10-22 NOTE — ED Triage Notes (Signed)
 Coming from home, by [facility], pt had toe surgery last week due neuropathy. PT has taken Oxycotin around the clock.   Complaints of abdominal cramps, and no BM since 4 days ago.   EMS vitals BP 164/88 SPO297% HR86 RR18 CBG 179

## 2023-10-22 NOTE — ED Provider Notes (Signed)
 [place] EMERGENCY DEPARTMENT AT [facility] Provider Note   CSN: [id number] Arrival date & time: 10/22/23  1944     Patient presents with: Constipation   [name]. is a 78 y.o. male.    Constipation Patient presents with constipation.  4 days without bowel movement.  Some abdominal cramping.  History of same.  History of both MS and chronic pain medicines.  More pain in the rectal area.  No fevers.    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   IMO SNOMED Dx Update Oct 2024     Charcot's arthropathy, diabetic (HCC)    left foot   Chronic pain syndrome followed by dr mark phillps at pain clinic   secondary to MS and diabetic neuropathy   Chronic periodontal disease    CKD (chronic kidney disease), stage II    hypertensive per PCP note   Coronary artery disease hx MI  s/p  DES to [facility] 2005   hx cardiologist-- dr morris until he retired,  now followed by pcp , dr shepard   Depression    Diabetic neuropathy [facility])    Essential tremor    Fecal impaction (HCC) 03/31/2017   Gait disorder    uses walker   History of CVA (cerebrovascular accident)    2011-  right subcortical cva--  residual left mininmal side weakness and no use of left head   History of CVA with residual deficit    2011  --  right subcortical cva w/ mininmal left hemiparesis   History of diabetic ulcer of foot    2010  left foot   History of MI (myocardial infarction)    2005   Hypertension    dr shepard   MS (multiple sclerosis) [facility])    dx 1985   Osteomyelitis of great toe of left foot (HCC) 01/30/2023   PONV (postoperative nausea and vomiting)    S/P drug eluting coronary stent placement    2005  to midRCA   Type 1 diabetes mellitus on insulin  therapy (HCC)     Prior to Admission medications   Medication Sig Start Date End Date Taking? Authorizing  Provider  amLODipine  (NORVASC ) 5 MG tablet Take 5 mg by mouth every morning.     [provider]  aspirin  81 MG tablet Take 81 mg by mouth in the morning.    [provider]  atorvastatin  (LIPITOR) 10 MG tablet Take 10 mg by mouth at bedtime.    [provider]  Chlorpheniramine Maleate (ALLERGY RELIEF PO) Take 1 tablet by mouth as needed.    [provider]  clopidogrel  (PLAVIX ) 75 MG tablet Take 75 mg by mouth in the morning.    [provider]  CRANBERRY PO Take 1 tablet by mouth in the morning and at bedtime.    [provider]  DULoxetine  (CYMBALTA ) 60 MG capsule Take 60 mg by mouth 2 (two) times daily.    [provider]  finasteride  (PROSCAR ) 5 MG tablet Take 5 mg by mouth in the morning.    [provider]  insulin  lispro (HUMALOG KWIKPEN) 100 UNIT/ML KwikPen Inject 0-15 Units into the skin 3 (three) times daily as needed (BS > 150).    [provider]  Insulin  NPH, Human,, Isophane, (HUMULIN  N KWIKPEN) 100 UNIT/ML Kiwkpen Inject 10-20 Units into the skin See admin  instructions. Inject 20 units in the morning and 10 units in the evening depending on blood sugar.    [provider]  metoprolol  tartrate (LOPRESSOR ) 25 MG tablet Take 25 mg by mouth 2 (two) times daily.    [provider]  Nutritional Supplements (NUTRITIONAL SHAKE) LIQD Take 1 Bottle by mouth daily.    [provider]  Nystatin  (GERHARDT'S BUTT CREAM) CREA Apply 1 Application topically 3 (three) times daily. 06/23/23   Will Almarie MATSU, MD  oxyCODONE  (ROXICODONE ) 15 MG immediate release tablet Take 1 tablet (15 mg total) by mouth every 6 (six) hours as needed for pain. 09/29/23     Polyethylene Glycol 3350  (PEG 3350 ) 17 GM/SCOOP POWD Take 17 g by mouth 2 (two) times daily. Patient taking differently: Take 17 g by mouth as needed. 06/23/23   Will Almarie MATSU, MD  Tapentadol  HCl (NUCYNTA  ER) 200 MG TB12 Take 200 mg by  mouth every 12 (twelve) hours. 09/29/23     traZODone  (DESYREL ) 150 MG tablet Take 150 mg by mouth at bedtime.    [provider]    Allergies: Patient has no known allergies.    Review of Systems  Gastrointestinal:  Positive for constipation.    Updated Vital Signs BP (!) 153/78   Pulse 83   Temp 98 F (36.7 C) (Oral)   Resp (!) 21   SpO2 100%   Physical Exam Vitals and nursing note reviewed.  Cardiovascular:     Rate and Rhythm: Regular rhythm.  Abdominal:     Tenderness: There is abdominal tenderness.     Comments: diffuse abdominal tenderness without rebound or guarding.  Genitourinary:    Comments: Rectal exam did show large amount of stool.  Some tenderness. Skin:    Capillary Refill: Capillary refill takes less than 2 seconds.  Neurological:     Mental Status: He is alert and oriented to person, place, and time.     (all labs ordered are listed, but only abnormal results are displayed) Labs Reviewed  BASIC METABOLIC PANEL WITH GFR - Abnormal; Notable for the following components:      Result Value   Chloride 96 (*)    Glucose, Bld 264 (*)    Creatinine, Ser 1.48 (*)    Calcium  8.8 (*)    GFR, Estimated 49 (*)    All other components within normal limits  CBC WITH DIFFERENTIAL/PLATELET - Abnormal; Notable for the following components:   WBC 12.3 (*)    RBC 4.08 (*)    MCV 102.7 (*)    Platelets 54 (*)    Neutro Abs 10.7 (*)    All other components within normal limits  CBG MONITORING, ED - Abnormal; Notable for the following components:   Glucose-Capillary 246 (*)    All other components within normal limits    EKG: None  Radiology: CT ABDOMEN PELVIS W CONTRAST Result Date: 10/22/2023 CLINICAL DATA:  History of recent toe surgery with narcotic medication use and no bowel movement for 4 days, initial encounter EXAM: CT ABDOMEN AND PELVIS WITH CONTRAST TECHNIQUE: Multidetector CT imaging of the abdomen and pelvis was performed using the  standard protocol following bolus administration of intravenous contrast. RADIATION DOSE REDUCTION: This exam was performed according to the departmental dose-optimization program which includes automated exposure control, adjustment of the mA and/or kV according to patient size and/or use of iterative reconstruction technique. CONTRAST:  OMNIPAQUE  IOHEXOL  300 MG/ML  SOLN COMPARISON:  10/10/2022 FINDINGS: Lower chest: Lung bases are  free of acute infiltrate or sizable effusion. 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 Calcified granuloma in the right lung base is noted. Hepatobiliary: No focal liver abnormality is seen. No gallstones, gallbladder wall thickening, or biliary dilatation. Pancreas: Pancreatic atrophy is noted. Spleen: Normal in size without focal abnormality. Adrenals/Urinary Tract: Adrenal glands are within normal limits. Kidneys are well visualized bilaterally with a normal enhancement pattern. Small renal cysts are noted bilaterally stable from the prior study. No follow-up is recommended. No calculi are noted. No obstructive changes are seen. The bladder is partially distended. Stomach/Bowel: Fecal material is noted throughout the colon consistent with some narcotic induced constipation. The appendix is within normal limits. A mild degree of rectal impaction is noted. Some increase in the degree of perirectal inflammatory changes noted consistent with stercoral proctitis. Small bowel and stomach are within normal limits. Vascular/Lymphatic: Aortic atherosclerosis. No enlarged abdominal or pelvic lymph nodes. Reproductive: Prostate is unremarkable. Other: No abdominal wall hernia or abnormality. No abdominopelvic ascites. Musculoskeletal: Degenerative changes of lumbar spine are noted. IMPRESSION: Increased fecal material scattered throughout the colon consistent with dark on a contused constipation. There are changes  consistent with mild rectal impaction and stercoral proctitis Chronic changes as described above stable from the prior exam. Electronically Signed   By: Oneil Devonshire M.D.   On: 10/22/2023 22:33     Procedures   Medications Ordered in the ED  fentaNYL  (SUBLIMAZE ) injection 50 mcg (50 mcg Intravenous Given 10/22/23 2101)  sodium chloride  0.9 % bolus 500 mL (0 mLs Intravenous Stopped 10/22/23 2230)  iohexol  (OMNIPAQUE ) 300 MG/ML solution 100 mL (100 mLs Intravenous Contrast Given 10/22/23 2224)                                    Medical Decision Making Amount and/or Complexity of Data Reviewed Labs: ordered. Radiology: ordered.  Risk Prescription drug management.   Patient with constipation.  Abdominal pain.  History of same.  On chronic pain meds but also has been on more medicines due to recent foot surgery.  Differential diagnose includes which is constipation but also causes such as stercoral colitis.  White count mildly elevated.  CT scan does show constipation but also some proctitis.  I think with his comorbidities, MS and constipation would benefit from admission to the hospital.  Will discuss with hospitalist     Final diagnoses:  Stercoral colitis  Constipation due to opioid therapy    ED Discharge Orders     None          Patsey Lot, MD 10/22/23 2244

## 2023-10-22 NOTE — H&P (Signed)
 History and Physical    [name] M Kottke Jr. FMW:[id number] DOB: 02-22-1945 DOA: 10/22/2023  PCP: Shepard Ade, MD  Patient coming from: Home  I have personally briefly reviewed patient's old medical records in [facility]  Chief Complaint: Constipation, abdominal pain  HPI: [name]. is a 78 y.o. male with medical history significant for multiple sclerosis, history of CVA with residual left-sided weakness, CAD s/p DES mRCA 2005, COPD, T1DM, CKD stage IIIa, HTN, HLD, chronic thrombocytopenia, PAD and osteomyelitis s/p left transmetatarsal and right 1st/2nd toe amputations, BPH, chronic pain, depression/anxiety who presented to the ED for evaluation of constipation and abdominal pain.  Patient reports 1 week of constipation, last bowel movement was on 9/8.  He has had some small-volume liquid stool output but otherwise no significant BM.  He has tried an enema at home without success.  He has had some nausea but no emesis.  He reports a similar episode for which he was admitted this time last year.  He denies fevers, chills, diaphoresis.  Patient states he is nonambulatory due to MS and prior stroke.  ED Course  Labs/Imaging on admission: I have personally reviewed following labs and imaging studies.  Initial vitals showed BP 153/78, pulse 90, RR 21, temp 98.0 F, SpO2 100% on room air.  Labs showed WBC 12.3, hemoglobin 13.5, platelets 54, sodium 136, potassium 4.5, bicarb 25, BUN 23, creatinine 1.48, serum glucose 264.  CT abdomen/pelvis with contrast showed increased fecal material scattered throughout the colon consistent with narcotic induced constipation.  There are changes consistent with mild rectal impaction and stercoral proctitis.  Patient was given 500 cc normal saline and fentanyl .  Large amount of stool in rectal vault per ED exam.  The hospitalist service was consulted for admission.  Review of Systems: All systems reviewed and are negative except as documented  in history of present illness above.   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   IMO SNOMED Dx Update Oct 2024     Charcot's arthropathy, diabetic (HCC)    left foot   Chronic pain syndrome followed by dr mark phillps at pain clinic   secondary to MS and diabetic neuropathy   Chronic periodontal disease    CKD (chronic kidney disease), stage II    hypertensive per PCP note   Coronary artery disease hx MI  s/p  DES to [facility] 2005   hx cardiologist-- dr morris until he retired,  now followed by pcp , dr shepard   Depression    Diabetic neuropathy [facility])    Essential tremor    Fecal impaction (HCC) 03/31/2017   Gait disorder    uses walker   History of CVA (cerebrovascular accident)    2011-  right subcortical cva--  residual left mininmal side weakness and no use of left head   History of CVA with residual deficit    2011  --  right subcortical cva w/ mininmal left hemiparesis   History of diabetic ulcer of foot    2010  left foot   History of MI (myocardial infarction)    2005   Hypertension    dr shepard   MS (multiple sclerosis) [facility])    dx 1985   Osteomyelitis of great toe of left foot (HCC) 01/30/2023   PONV (postoperative nausea and vomiting)    S/P drug eluting coronary stent placement  2005  to midRCA   Type 1 diabetes mellitus on insulin  therapy [facility])     Past Surgical History:  Procedure Laterality Date   AMPUTATION Right 03/01/2023   Procedure: AMPUTATION RIGHT GREAT TOE;  Surgeon: Malvin Marsa FALCON, DPM;  Location: [facility] OR;  Service: Orthopedics/Podiatry;  Laterality: Right;   AMPUTATION TOE Left 01/30/2023   Procedure: AMPUTATION TOE;  Surgeon: Malvin Marsa FALCON, DPM;  Location: [facility] OR;  Service: Orthopedics/Podiatry;  Laterality: Left;  Amputation of left great toe   AMPUTATION TOE Left 03/01/2023   Procedure: AMPUTATION  LEFT SECOND TOE;  Surgeon: Malvin Marsa FALCON, DPM;  Location: [facility] OR;  Service: Orthopedics/Podiatry;  Laterality: Left;   AMPUTATION TOE Left 06/21/2023   Procedure: Left 3rd toe amputation;  Surgeon: Malvin Marsa FALCON, DPM;  Location[facility] OR;  Service: Orthopedics/Podiatry;  Laterality: Left;  Left 3rd toe amputation   AMPUTATION TOE Right 09/25/2023   Procedure: RIGHT SECOND TOE AMPUTATION;  Surgeon: Malvin Marsa FALCON, DPM;  Location: [facility] OR;  Service: Orthopedics/Podiatry;  Laterality: Right;   CATARACT EXTRACTION W/ INTRAOCULAR LENS  IMPLANT, BILATERAL  Nov 2016   CORONARY ANGIOPLASTY WITH STENT PLACEMENT  10-18-2003  dr morris   DES x1  to  [facility] (high-grade lesion), diffuse diabetic abnormalities throughout the entire coronary tree w/ diffuse calcification and segmental plaquing throughout but noncritical disease   HARDWARE REMOVAL Left 04/20/2012   Procedure: Left foot removal deep retained screw;  Surgeon: Jerona LULLA Sage, MD;  Location: [facility] OR;  Service: Orthopedics;  Laterality: Left;   MULTIPLE EXTRACTIONS WITH ALVEOLOPLASTY N/A 05/15/2015   Procedure: MULTIPLE EXTRACTIONS;  Surgeon: Lamar Sabal, DDS;  Location: [facility] [place];  Service: Dentistry;  Laterality: N/A;   ORIF ANKLE FRACTURE  12/30/2011   Procedure: OPEN REDUCTION INTERNAL FIXATION (ORIF) ANKLE FRACTURE;  Surgeon: Jerona LULLA Sage, MD;  Location: [facility] OR;  Service: Orthopedics;  Laterality: Left;  Excision Bone, Abscess Left Charcot Foot, Place Antibiotic Beads, Fixation Charcot   REMOVAL SUBMANDIDULAR GLAND, BILATERAL  1970's   benign   TRANSMETATARSAL AMPUTATION Left 09/25/2023   Procedure: AMPUTATION, FOOT, TRANSMETATARSAL;  Surgeon: Malvin Marsa FALCON, DPM;  Location: [facility] OR;  Service: Orthopedics/Podiatry;  Laterality: Left;    Social History: Social History   Tobacco Use   Smoking status: Former    Current packs/day: 0.00    Average packs/day: 0.5 packs/day for 26.0 years (13.0 ttl pk-yrs)     Types: Cigars, Cigarettes    Start date: 02/16/1986    Quit date: 02/17/2012    Years since quitting: 11.6   Smokeless tobacco: Never   Tobacco comments:    stopped cigar's 2014/  cigarette's stopped 1990's  Substance Use Topics   Alcohol  use: No   Drug use: No   No Known Allergies  Family History  Problem Relation Age of Onset   Tremor Mother    Heart disease Father    Celiac disease Brother    Cancer - Prostate Paternal Grandfather      Prior to Admission medications   Medication Sig Start Date End Date Taking? Authorizing Provider  amLODipine  (NORVASC ) 5 MG tablet Take 5 mg by mouth every morning.     [provider]  aspirin  81 MG tablet Take 81 mg by mouth in the morning.    [provider]  atorvastatin  (LIPITOR) 10 MG tablet Take 10 mg by mouth at bedtime.    [provider]  Chlorpheniramine Maleate (ALLERGY RELIEF PO) Take 1 tablet by mouth as needed.  [provider]  clopidogrel  (PLAVIX ) 75 MG tablet Take 75 mg by mouth in the morning.    [provider]  CRANBERRY PO Take 1 tablet by mouth in the morning and at bedtime.    [provider]  DULoxetine  (CYMBALTA ) 60 MG capsule Take 60 mg by mouth 2 (two) times daily.    [provider]  finasteride  (PROSCAR ) 5 MG tablet Take 5 mg by mouth in the morning.    [provider]  insulin  lispro (HUMALOG KWIKPEN) 100 UNIT/ML KwikPen Inject 0-15 Units into the skin 3 (three) times daily as needed (BS > 150).    [provider]  Insulin  NPH, Human,, Isophane, (HUMULIN  N KWIKPEN) 100 UNIT/ML Kiwkpen Inject 10-20 Units into the skin See admin instructions. Inject 20 units in the morning and 10 units in the evening depending on blood sugar.    [provider]  metoprolol  tartrate (LOPRESSOR ) 25 MG tablet Take 25 mg by mouth 2 (two) times daily.    [provider]  Nutritional Supplements (NUTRITIONAL SHAKE) LIQD Take 1 Bottle by mouth  daily.    [provider]  Nystatin  (GERHARDT'S BUTT CREAM) CREA Apply 1 Application topically 3 (three) times daily. 06/23/23   Will Almarie MATSU, MD  oxyCODONE  (ROXICODONE ) 15 MG immediate release tablet Take 1 tablet (15 mg total) by mouth every 6 (six) hours as needed for pain. 09/29/23     Polyethylene Glycol 3350  (PEG 3350 ) 17 GM/SCOOP POWD Take 17 g by mouth 2 (two) times daily. Patient taking differently: Take 17 g by mouth as needed. 06/23/23   Will Almarie MATSU, MD  Tapentadol  HCl (NUCYNTA  ER) 200 MG TB12 Take 200 mg by mouth every 12 (twelve) hours. 09/29/23     traZODone  (DESYREL ) 150 MG tablet Take 150 mg by mouth at bedtime.    [provider]    Physical Exam: Vitals:   10/22/23 2145 10/22/23 2215 10/22/23 2230 10/22/23 2245  BP: (!) 146/73 (!) 165/141 (!) 158/77   Pulse: 82 87 86   Resp: 18 (!) 28  16  Temp:      TempSrc:      SpO2: 99% 97% 99%    Constitutional: Chronically ill.  Patient resting in bed, appears uncomfortable Eyes: EOMI, lids and conjunctivae normal ENMT: Mucous membranes are dry. Posterior pharynx clear of any exudate or lesions.Normal dentition.  Neck: normal, supple, no masses. Respiratory: clear to auscultation bilaterally, no wheezing, no crackles. Normal respiratory effort. No accessory muscle use.  Cardiovascular: Regular rate and rhythm, no murmurs / rubs / gallops. No extremity edema. 2+ pedal pulses. Abdomen: Generalized tenderness, no masses palpated. Musculoskeletal: S/p left transmetatarsal amputation and right 1st and 2nd toe amputations Skin: no rashes, lesions, ulcers. No induration Neurologic: Sensation intact. Strength 5/5 while lying in bed. Psychiatric: Normal judgment and insight. Alert and oriented x 3. Normal mood.   EKG: Not performed.  Assessment/Plan Principal Problem:   Stercoral colitis Active Problems:   Type 1 diabetes mellitus with complications (HCC)   Essential hypertension   Diabetic  polyneuropathy (HCC)   Multiple sclerosis (HCC)   Chronic pain syndrome   Coronary artery disease involving native coronary artery   Depression with anxiety   History of CVA (cerebrovascular accident)   Chronic kidney disease, stage 3a (HCC)   Thrombocytopenia (HCC)   COPD (chronic obstructive pulmonary disease) (HCC)   BPH (benign prostatic hyperplasia)   Peripheral artery disease (HCC)   Cornelis M Tedesco Jr. is a 78  y.o. 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  Assessment and Plan: Stercoral colitis/proctitis: Secondary to chronic narcotic pain regimen and nonambulatory status.  Would benefit from Movantik  as an outpatient. - Start scheduled Senokot/MiraLAX  twice daily - Give smog enema - Continue IV fluid hydration overnight - Start IV ceftriaxone /Flagyl   Type 1 diabetes: Placed on Semglee  10 units nightly, SSI.  Adjust as needed.  Chronic thrombocytopenia: Chronic and stable.  Monitor closely while on aspirin /Plavix .  History of CVA with left-sided hemiparesis: Chronic and stable.  Continue aspirin /Plavix , atorvastatin .  PAD: With history of osteomyelitis s/p left transmetatarsal and right 1st/2nd toe amputations.  Continue aspirin , Plavix , statin.  CAD s/p PCI: Stable, denies chest pain.  Continue aspirin , Plavix , statin, Toprol .  COPD: Stable.  Continue albuterol  as needed, supplemental oxygen  as needed.  Hypertension: Continue amlodipine  and Lopressor .  History of multiple sclerosis: At baseline, does not appear to be on any disease modifying agents.  Has chronic debility with nonambulatory status related to this and prior CVA.  CKD stage  IIIa: Renal function stable.  Continue to monitor.  Depression/anxiety: Continue Cymbalta  and trazodone .  BPH: Continue finasteride .  Chronic pain syndrome/peripheral neuropathy: Continue Cymbalta  and home Nucynta  and prn oxycodone .   DVT prophylaxis: SCDs Start: 10/22/23 2332 Code Status:   Code Status: Limited: Do not attempt resuscitation (DNR) -DNR-LIMITED -Do Not Intubate/DNI confirmed with patient on admission. Family Communication: Discussed with patient, he has discussed with family Disposition Plan: From home and likely discharge to home pending clinical progress Consults called: None Severity of Illness: The appropriate patient status for this patient is INPATIENT. Inpatient status is judged to be reasonable and necessary in order to provide the required intensity of service to ensure the patient's safety. The patient's presenting symptoms, physical exam findings, and initial radiographic and laboratory data in the context of their chronic comorbidities is felt to place them at high risk for further clinical deterioration. Furthermore, it is not anticipated that the patient will be medically stable for discharge from the hospital within 2 midnights of admission.   * I certify that at the point of admission it is my clinical judgment that the patient will require inpatient hospital care spanning beyond 2 midnights from the point of admission due to high intensity of service, high risk for further deterioration and high frequency of surveillance required.DEWAINE Jorie Blanch MD [facility]  If 7PM-7AM, please contact night-coverage [email or web address]  10/22/2023, 11:44 PM

## 2023-10-22 NOTE — Hospital Course (Signed)
 [name] Mickey. is a 78 y.o. 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

## 2023-10-23 ENCOUNTER — Other Ambulatory Visit: Payer: Self-pay

## 2023-10-23 DIAGNOSIS — K5289 Other specified noninfective gastroenteritis and colitis: Secondary | ICD-10-CM | POA: Diagnosis not present

## 2023-10-23 LAB — CBC
HCT: 35.2 % — ABNORMAL LOW (ref 39.0–52.0)
Hemoglobin: 11.5 g/dL — ABNORMAL LOW (ref 13.0–17.0)
MCH: 33.9 pg (ref 26.0–34.0)
MCHC: 32.7 g/dL (ref 30.0–36.0)
MCV: 103.8 fL — ABNORMAL HIGH (ref 80.0–100.0)
Platelets: 46 K/uL — ABNORMAL LOW (ref 150–400)
RBC: 3.39 MIL/uL — ABNORMAL LOW (ref 4.22–5.81)
RDW: 13.5 % (ref 11.5–15.5)
WBC: 11.8 K/uL — ABNORMAL HIGH (ref 4.0–10.5)
nRBC: 0 % (ref 0.0–0.2)

## 2023-10-23 LAB — GLUCOSE, CAPILLARY
Glucose-Capillary: 124 mg/dL — ABNORMAL HIGH (ref 70–99)
Glucose-Capillary: 138 mg/dL — ABNORMAL HIGH (ref 70–99)
Glucose-Capillary: 157 mg/dL — ABNORMAL HIGH (ref 70–99)
Glucose-Capillary: 252 mg/dL — ABNORMAL HIGH (ref 70–99)
Glucose-Capillary: 295 mg/dL — ABNORMAL HIGH (ref 70–99)

## 2023-10-23 LAB — BASIC METABOLIC PANEL WITH GFR
Anion gap: 11 (ref 5–15)
BUN: 23 mg/dL (ref 8–23)
CO2: 26 mmol/L (ref 22–32)
Calcium: 8.5 mg/dL — ABNORMAL LOW (ref 8.9–10.3)
Chloride: 101 mmol/L (ref 98–111)
Creatinine, Ser: 1.33 mg/dL — ABNORMAL HIGH (ref 0.61–1.24)
GFR, Estimated: 55 mL/min — ABNORMAL LOW (ref >60)
Glucose, Bld: 233 mg/dL — ABNORMAL HIGH (ref 70–99)
Potassium: 5 mmol/L (ref 3.5–5.1)
Sodium: 138 mmol/L (ref 135–145)

## 2023-10-23 MED ORDER — MAGNESIUM CITRATE PO SOLN
1.0000 | Freq: Once | ORAL | Status: AC
Start: 2023-10-23 — End: 2023-10-23
  Administered 2023-10-23: 1 via ORAL
  Filled 2023-10-23: qty 296

## 2023-10-23 MED ORDER — INSULIN GLARGINE 100 UNIT/ML ~~LOC~~ SOLN
10.0000 [IU] | Freq: Every day | SUBCUTANEOUS | Status: DC
  Administered 2023-10-23: 10 [IU] via SUBCUTANEOUS
  Filled 2023-10-23 (×3): qty 0.1

## 2023-10-23 MED ORDER — BISACODYL 10 MG RE SUPP
10.0000 mg | Freq: Once | RECTAL | Status: DC
Start: 2023-10-23 — End: 2023-10-24
  Filled 2023-10-23: qty 1

## 2023-10-23 NOTE — Progress Notes (Signed)
 Progress Note   Patient: [name] Yepiz Alen Jr. FMW:[id number] DOB: [birth year] DOA: 10/22/2023     1 DOS: the patient was seen and examined on 10/23/2023   Brief hospital course: Channing M Dulak Mickey. is a 78 y.o. 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  Assessment and Plan: Stercoral colitis/proctitis: Secondary to chronic narcotic pain regimen and nonambulatory status.  Would benefit from Movantik  as an outpatient. - Start scheduled Senokot/MiraLAX  twice daily, without any effect - Patient refused enemas. - Patient has been initiated on mag citrate and Dulcolax suppository.  Currently starting to have some bowel movement. - Continue IV fluid hydration overnight - COntinue IV ceftriaxone /Flagyl    Type 1 diabetes: Placed on Semglee  10 units nightly, SSI.  Adjust as needed.   Chronic thrombocytopenia: Chronic and stable.  Monitor closely while on aspirin /Plavix .   History of CVA with left-sided hemiparesis: Chronic and stable.  Continue aspirin /Plavix , atorvastatin .   PAD: With history of osteomyelitis s/p left transmetatarsal and right 1st/2nd toe amputations.  Continue aspirin , Plavix , statin.   CAD s/p PCI: Stable, denies chest pain.  Continue aspirin , Plavix , statin, Toprol .   COPD: Stable.  Continue albuterol  as needed, supplemental oxygen  as needed.   Hypertension: Continue amlodipine  and Lopressor .   History of multiple sclerosis: At baseline, does not appear to be on any disease modifying agents.  Has chronic debility with nonambulatory status related to this and prior CVA.   CKD stage IIIa: Renal function stable.  Continue to monitor.    Depression/anxiety: Continue Cymbalta  and trazodone .   BPH: Continue finasteride .   Chronic pain syndrome/peripheral neuropathy: Continue Cymbalta  and home Nucynta  and prn oxycodone .   DVT prophylaxis: SCDs Start: 10/22/23 2332      Subjective: Patient seen and examined at bedside today.  Reports that he started having some bowel movement.  Reports feeling very weak.  Physical Exam: Vitals:   10/23/23 0324 10/23/23 0832 10/23/23 1024 10/23/23 1417  BP: 138/67 (!) 159/73 (!) 181/83 (!) 144/47  Pulse: 80 87 (!) 102 75  Resp: 20 16  16   Temp: 98.4 F (36.9 C) 97.9 F (36.6 C)  99.4 F (37.4 C)  TempSrc:  Oral    SpO2:    96%   Constitutional: Chronically ill.  Patient resting in bed, appears uncomfortable Eyes: EOMI, lids and conjunctivae normal ENMT: Mucous membranes are dry. Posterior pharynx clear of any exudate or lesions.Normal dentition.  Neck: normal, supple, no masses. Respiratory: clear to auscultation bilaterally, no wheezing, no crackles. Normal respiratory effort. No accessory muscle use.  Cardiovascular: Regular rate and rhythm, no murmurs / rubs / gallops. No extremity edema. 2+ pedal pulses. Abdomen: Generalized tenderness, no masses palpated. Musculoskeletal: S/p left transmetatarsal amputation and right 1st and 2nd toe amputations Skin: no rashes, lesions, ulcers. No induration Neurologic: Sensation intact. Strength 5/5 while lying in bed. Psychiatric: Normal judgment and insight. Alert and oriented x 3. Normal mood.    Data Reviewed:  There are no new results to review at this time.  Family Communication: Patient is alert and oriented  Disposition: Status is: Inpatient Remains inpatient appropriate because: colitis  Planned Discharge Destination: Home    Time spent: 55 minutes  Author: Ellouise SHAUNNA Ra, MD 10/23/2023 3:32 PM  For on call  review [email or web address].

## 2023-10-23 NOTE — Plan of Care (Signed)

## 2023-10-23 NOTE — Progress Notes (Signed)
 [name] brought medicines in an unlabeled container. As per patient it is his pain medication. This RN gave the container to patient's nurse.

## 2023-10-24 ENCOUNTER — Other Ambulatory Visit (HOSPITAL_COMMUNITY): Payer: Self-pay

## 2023-10-24 ENCOUNTER — Other Ambulatory Visit (HOSPITAL_BASED_OUTPATIENT_CLINIC_OR_DEPARTMENT_OTHER): Payer: Self-pay

## 2023-10-24 ENCOUNTER — Other Ambulatory Visit: Payer: Self-pay

## 2023-10-24 DIAGNOSIS — K5289 Other specified noninfective gastroenteritis and colitis: Secondary | ICD-10-CM | POA: Diagnosis not present

## 2023-10-24 LAB — GLUCOSE, CAPILLARY
Glucose-Capillary: 200 mg/dL — ABNORMAL HIGH (ref 70–99)
Glucose-Capillary: 197 mg/dL — ABNORMAL HIGH (ref 70–99)

## 2023-10-24 MED ORDER — SENNOSIDES-DOCUSATE SODIUM 8.6-50 MG PO TABS
2.0000 | ORAL_TABLET | Freq: Two times a day (BID) | ORAL | 1 refills | Status: AC
  Filled 2023-10-24 (×2): qty 100, 25d supply, fill #0

## 2023-10-24 MED ORDER — SENNOSIDES-DOCUSATE SODIUM 8.6-50 MG PO TABS
2.0000 | ORAL_TABLET | Freq: Every day | ORAL | 1 refills | Status: DC
  Filled 2023-10-24: qty 60, 30d supply, fill #0

## 2023-10-24 MED ORDER — METRONIDAZOLE 500 MG PO TABS
500.0000 mg | ORAL_TABLET | Freq: Three times a day (TID) | ORAL | 0 refills | Status: AC
  Filled 2023-10-24 (×2): qty 21, 7d supply, fill #0

## 2023-10-24 MED ORDER — LACTINEX PO CHEW: 1.0000 | CHEWABLE_TABLET | Freq: Three times a day (TID) | ORAL | 0 refills | Status: AC

## 2023-10-24 MED ORDER — LACTINEX PO CHEW
1.0000 | CHEWABLE_TABLET | Freq: Three times a day (TID) | ORAL | 0 refills | Status: DC
Start: 2023-10-24 — End: 2023-10-24
  Filled 2023-10-24: qty 30, 10d supply, fill #0

## 2023-10-24 MED ORDER — CIPROFLOXACIN HCL 500 MG PO TABS
500.0000 mg | ORAL_TABLET | Freq: Two times a day (BID) | ORAL | 0 refills | Status: AC
Start: 2023-10-25 — End: 2023-11-01
  Filled 2023-10-24 (×2): qty 14, 7d supply, fill #0

## 2023-10-24 NOTE — Plan of Care (Signed)

## 2023-10-24 NOTE — Discharge Summary (Signed)
 Physician Discharge Summary   Patient: [name] Tonne Bacorn Jr. MRN: [id number] DOB: 1946-01-28  Admit date:     10/22/2023  Discharge date: 10/24/23  Discharge Physician: Adriana DELENA Grams   PCP: Shepard Ade, MD   Recommendations at discharge:   Follow-up with PCP in 1 week Continue recommended antibiotics Continue recommended bowel regiment Ambulate is much as possible, continue with home health PT OT   Discharge Diagnoses: Principal Problem:   Stercoral colitis Active Problems:   Type 1 diabetes mellitus with complications (HCC)   Essential hypertension   Diabetic polyneuropathy (HCC)   Multiple sclerosis (HCC)   Chronic pain syndrome   Coronary artery disease involving native coronary artery   Depression with anxiety   History of CVA (cerebrovascular accident)   Chronic kidney disease, stage 3a (HCC)   Thrombocytopenia (HCC)   COPD (chronic obstructive pulmonary disease) (HCC)   BPH (benign prostatic hyperplasia)   Peripheral artery disease (HCC)  Resolved Problems:   * No resolved hospital problems. *  Hospital Course: [name]. is a 78 y.o. 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  Stercoral colitis/proctitis: Secondary to chronic narcotic pain regimen and nonambulatory status.   May benefit from Movantik  as an outpatient. - Back to scheduled Senokot/MiraLAX  twice daily, without any effect - Patient refused enemas. - was treated with on mag citrate and Dulcolax suppository.  Currently starting to have some bowel movement. Status post successful bowel movements, switch to Senokot daily with as needed MiraLAX  -S/p IV hydration - COntinue  IV ceftriaxone /Flagyl  >>> switching to p.o. ciprofloxacin /Flagyl  for total of 10 days of antibiotic coverage   Type 1 diabetes: -Resume carb modified diet advance as tolerated -Resuming home regimen, Humalog and NPH   Chronic thrombocytopenia: Chronic and stable.  Monitor closely while on aspirin /Plavix .   History of CVA with left-sided hemiparesis: Chronic and stable.  Continue aspirin /Plavix , atorvastatin .   PAD: With history of osteomyelitis s/p left transmetatarsal and right 1st/2nd toe amputations.  Continue aspirin , Plavix , statin.   CAD s/p PCI: Stable, denies chest pain.  Continue aspirin , Plavix , statin, Toprol .   COPD: Stable.  Continue albuterol  as needed, supplemental oxygen  as needed.   Hypertension: Continue amlodipine  and Lopressor .   History of multiple sclerosis: At baseline, does not appear to be on any disease modifying agents.  Has chronic debility with nonambulatory status related to this and prior CVA.   CKD stage IIIa: Renal function stable.  Continue to monitor.   Depression/anxiety: Continue Cymbalta  and trazodone .   BPH: Continue finasteride .   Chronic pain syndrome/peripheral neuropathy: Continue Cymbalta  and home Nucynta  and prn oxycodone .    Disposition: Home health Diet recommendation:  Discharge Diet Orders (From admission, onward)     Start     Ordered   10/24/23 0000  Diet - low sodium heart healthy        10/24/23 1037           Cardiac and Carb modified diet DISCHARGE MEDICATION: Allergies as of 10/24/2023   No Known Allergies      Medication List     TAKE these medications    ALLERGY RELIEF PO Take 1 tablet by mouth as needed (allergies).   amLODipine  5 MG tablet Commonly known as: NORVASC   Take 5 mg by mouth every morning.   aspirin  81 MG tablet Take 81 mg by mouth in the morning.   atorvastatin  10 MG tablet Commonly known as: LIPITOR Take 10 mg by mouth at bedtime.   ciprofloxacin  500 MG  tablet Commonly known as: Cipro  Take 1 tablet (500 mg total) by mouth 2 (two) times daily for 7 days. Start taking on: October 25, 2023   clopidogrel  75 MG tablet Commonly known as: PLAVIX  Take 75 mg by mouth in the morning.   CRANBERRY PO Take 1 tablet by mouth in the morning and at bedtime.   DULoxetine  60 MG capsule Commonly known as: CYMBALTA  Take 60 mg by mouth 2 (two) times daily.   finasteride  5 MG tablet Commonly known as: PROSCAR  Take 5 mg by mouth in the morning.   Gerhardt's butt cream Crea Apply 1 Application topically 3 (three) times daily.   HumaLOG KwikPen 100 UNIT/ML KwikPen Generic drug: insulin  lispro Inject 0-15 Units into the skin 3 (three) times daily as needed (BS > 150).   HumuLIN  N KwikPen 100 UNIT/ML KwikPen Generic drug: Insulin  NPH (Human) (Isophane) Inject 10-20 Units into the skin See admin instructions. Inject 20 units in the morning and 10 units in the evening depending on blood sugar.   lactobacillus acidophilus & bulgar chewable tablet Chew 1 tablet by mouth 3 (three) times daily with meals for 10 days.   metoprolol  tartrate 25 MG tablet Commonly known as: LOPRESSOR  Take 25 mg by mouth 2 (two) times daily.   metroNIDAZOLE  500 MG tablet Commonly known as: FLAGYL  Take 1 tablet (500 mg total) by mouth 3 (three) times daily for 7 days.   Nucynta  ER 200 MG Tb12 Generic drug: Tapentadol  HCl Take 200 mg by mouth every 12 (twelve) hours.   Nutritional Shake Liqd Take 1 Bottle by mouth daily.   oxyCODONE  15 MG immediate release tablet Commonly known as: ROXICODONE  Take 1 tablet (15 mg total) by mouth every 6 (six) hours as needed for pain.   polyethylene glycol powder 17 GM/SCOOP powder Commonly known as: GLYCOLAX /MIRALAX  Take 17 g by mouth 2 (two) times daily. What changed:  when to take this reasons to take this   senna-docusate 8.6-50 MG tablet Commonly known as: Senokot-S Take 2 tablets by mouth 2 (two) times daily.    traZODone  150 MG tablet Commonly known as: DESYREL  Take 150 mg by mouth at bedtime.               Discharge Care Instructions  (From admission, onward)           Start     Ordered   10/24/23 0000  Discharge wound care:       Comments: Pressure Injury 06/21/23 Coccyx Posterior;Lower Stage 2 -  Partial thickness loss of dermis presenting as a shallow open injury with a red, pink wound bed without slough.   10/24/23 1037            Discharge Exam: Vitals:   10/24/23 0510 10/24/23 0913  BP: (!) 157/68 138/76  Pulse: 67 70  Resp:    Temp: 98.1 F (36.7 C)   SpO2: 98%         General:  AAO x 3,  cooperative, no distress;   HEENT:  Normocephalic, PERRL, otherwise with in Normal limits   Neuro:  CNII-XII intact. , normal motor and sensation, reflexes intact   Lungs:   Clear to auscultation BL, Respirations unlabored,  No wheezes / crackles  Cardio:  S1/S2, RRR, No murmure, No Rubs or Gallops   Abdomen:  Soft, non-tender, bowel sounds active all four quadrants, no guarding or peritoneal signs.  Muscular  skeletal:  Bedbound with generalized weakness (At Baseline) Limited exam -global generalized weaknesses - in bed, able to move all 4 extremities,   2+ pulses,  symmetric, No pitting edema  Skin:  Dry, warm to touch, negative for any Rashes,  Wounds: Please see nursing documentation  Pressure Injury 06/21/23 Coccyx Posterior;Lower Stage 2 -  Partial thickness loss of dermis presenting as a shallow open injury with a red, pink wound bed without slough. (Active)  06/21/23 1501  Location: Coccyx  Location Orientation: Posterior;Lower  Staging: Stage 2 -  Partial thickness loss of dermis presenting as a shallow open injury with a red, pink wound bed without slough.  Wound Description (Comments):   DO NOT USE:  Present on Admission: Yes  Dressing Type None 10/23/23 0815          Condition at discharge: fair  The results of significant diagnostics from  this hospitalization (including imaging, microbiology, ancillary and laboratory) are listed below for reference.   Imaging Studies: CT ABDOMEN PELVIS W CONTRAST Result Date: 10/22/2023 CLINICAL DATA:  History of recent toe surgery with narcotic medication use and no bowel movement for 4 days, initial encounter EXAM: CT ABDOMEN AND PELVIS WITH CONTRAST TECHNIQUE: Multidetector CT imaging of the abdomen and pelvis was performed using the standard protocol following bolus administration of intravenous contrast. RADIATION DOSE REDUCTION: This exam was performed according to the departmental dose-optimization program which includes automated exposure control, adjustment of the mA and/or kV according to patient size and/or use of iterative reconstruction technique. CONTRAST:  OMNIPAQUE  IOHEXOL  300 MG/ML  SOLN COMPARISON:  10/10/2022 FINDINGS: Lower chest: Lung bases are free of acute infiltrate or sizable effusion. 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 Calcified granuloma in the right lung base is noted. Hepatobiliary: No focal liver abnormality is seen. No gallstones, gallbladder wall thickening, or biliary dilatation. Pancreas: Pancreatic atrophy is noted. Spleen: Normal in size without focal abnormality. Adrenals/Urinary Tract: Adrenal glands are within normal limits. Kidneys are well visualized bilaterally with a normal enhancement pattern. Small renal cysts are noted bilaterally stable from the prior study. No follow-up is recommended. No calculi are noted. No obstructive changes are seen. The bladder is partially distended. Stomach/Bowel: Fecal material is noted throughout the colon consistent with some narcotic induced constipation. The appendix is within normal limits. A mild degree of rectal impaction is noted. Some increase in the degree of perirectal inflammatory changes noted consistent with stercoral proctitis. Small bowel and  stomach are within normal limits. Vascular/Lymphatic: Aortic atherosclerosis. No enlarged abdominal or pelvic lymph nodes. Reproductive: Prostate is unremarkable. Other: No abdominal wall hernia or abnormality. No abdominopelvic ascites. Musculoskeletal: Degenerative changes of lumbar spine are noted. IMPRESSION: Increased fecal material scattered throughout the colon consistent with dark on a contused constipation. There are changes consistent with mild rectal impaction and stercoral proctitis Chronic changes as described above stable from the prior exam. Electronically Signed   By: Oneil Devonshire M.D.   On: 10/22/2023 22:33   VAS US  ABI WITH/WO TBI Result Date: 09/27/2023  LOWER EXTREMITY DOPPLER STUDY Patient Name:  YAHMIR SOKOLOV Grupe JR.  Date of Exam:   09/27/2023 Medical Rec #: [id number]          Accession #:    [id number] Date of Birth: 1945/11/14  Patient Gender: M Patient Age:   25 years Exam Location:  [facility] Procedure:      VAS US  ABI WITH/WO TBI Referring Phys: DONALDA APPLEBAUM --------------------------------------------------------------------------------  Indications: Peripheral artery disease. High Risk Factors: Hypertension, hyperlipidemia, Diabetes, past history of                    smoking, prior MI, coronary artery disease, prior CVA. Other Factors: CKD, hx of coronary stent placement, left TMA and right second                toe amputation on 09/25/2023.  Limitations: Today's exam was limited due to involuntary patient movement and              bandages. Comparison Study: Previous exam 06/21/2023 Performing Technologist: Ezzie Potters RVT, RDMS  Examination Guidelines: A complete evaluation includes at minimum, Doppler waveform signals and systolic blood pressure reading at the level of bilateral brachial, anterior tibial, and posterior tibial arteries, when vessel segments are accessible. Bilateral testing is considered an integral part of a complete examination. Photoelectric  Plethysmograph (PPG) waveforms and toe systolic pressure readings are included as required and additional duplex testing as needed. Limited examinations for reoccurring indications may be performed as noted.  ABI Findings: +---------+------------------+-----+---------+----------------+ Right    Rt Pressure (mmHg)IndexWaveform Comment          +---------+------------------+-----+---------+----------------+ Brachial 175                    triphasic                 +---------+------------------+-----+---------+----------------+ PTA                             biphasic non compressible +---------+------------------+-----+---------+----------------+ DP                              triphasicnon compressible +---------+------------------+-----+---------+----------------+ Great Toe                                amputated        +---------+------------------+-----+---------+----------------+ +---------+------------------+-----+----------------+---------+ Left     Lt Pressure (mmHg)IndexWaveform        Comment   +---------+------------------+-----+----------------+---------+ Brachial 170                    triphasic                 +---------+------------------+-----+----------------+---------+ PTA      208               1.19 audibly biphasic          +---------+------------------+-----+----------------+---------+ DP       179               1.02 audibly biphasic          +---------+------------------+-----+----------------+---------+ Great Toe                                       amputated +---------+------------------+-----+----------------+---------+ +-------+-----------+-----------+------------+-------------+ ABI/TBIToday's ABIToday's TBIPrevious ABIPrevious TBI  +-------+-----------+-----------+------------+-------------+ Right  [place]         amputated  1.56        amputated     +-------+-----------+-----------+------------+-------------+ Left   1.19  amputated  1.01        wound/bandage +-------+-----------+-----------+------------+-------------+  *See table(s) above for measurements and observations.  Electronically signed by Gaile New MD on 09/27/2023 at 4:07:44 PM.    Final    DG Foot 2 Views Right Result Date: 09/25/2023 CLINICAL DATA:  Postop. EXAM: RIGHT FOOT - 2 VIEW COMPARISON:  Radiograph 03/01/2023 FINDINGS: Intervally PTA shin of the second toe at the base of the proximal phalanx. The resection margin is smooth. Expected postoperative changes in the overlying soft tissues. Previous resection of the great toe. Chronic mid/hindfoot arthropathy. IMPRESSION: Interval amputation of the second toe at the base of the proximal phalanx. Electronically Signed   By: Andrea Gasman M.D.   On: 09/25/2023 13:20   DG Foot 2 Views Left Result Date: 09/25/2023 CLINICAL DATA:  Postop. EXAM: LEFT FOOT - 2 VIEW COMPARISON:  Preoperative imaging FINDINGS: Interval transmetatarsal amputation of all 5 rays. There is section margins are smooth. Expected postoperative changes in the overlying a operative bed. Chronic midfoot arthropathy. 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 Electronically Signed   By: Andrea Gasman M.D.   On: 09/25/2023 13:19    Microbiology: Results for orders placed or performed during the hospital encounter of 09/22/23  MRSA Next Gen by PCR, Nasal     Status: None   Collection Time: 09/25/23 12:54 AM   Specimen: Nasal Mucosa; Nasal Swab  Result Value Ref Range Status   MRSA by PCR Next Gen NOT DETECTED NOT DETECTED Final    Comment: (NOTE) The GeneXpert MRSA Assay (FDA approved for NASAL specimens only), is one component of a comprehensive MRSA colonization surveillance program. It is not intended to diagnose MRSA infection nor to guide or monitor treatment for MRSA infections. Test performance is not FDA approved in patients less than 84 years old. Performed at [facility], [address], [place], [place] [address]   Aerobic/Anaerobic Culture w Gram Stain (surgical/deep wound)     Status: None   Collection Time: 09/25/23  8:50 AM   Specimen: Foot, Left; Tissue  Result Value Ref Range Status   Specimen Description TISSUE  Final   Special Requests NONE  Final   Gram Stain NO WBC SEEN FEW GRAM POSITIVE COCCI   Final   Culture   Final    ABUNDANT STAPHYLOCOCCUS AUREUS NO ANAEROBES ISOLATED Performed at [facility], [address], [place], [place] [address]    Report Status 09/30/2023 FINAL  Final   Organism ID, Bacteria STAPHYLOCOCCUS AUREUS  Final      Susceptibility   Staphylococcus aureus - MIC*    CIPROFLOXACIN  <=0.5 SENSITIVE Sensitive     ERYTHROMYCIN >=8 RESISTANT Resistant     GENTAMICIN  <=0.5 SENSITIVE Sensitive     OXACILLIN 0.5 SENSITIVE Sensitive     TETRACYCLINE >=16 RESISTANT Resistant     VANCOMYCIN  1 SENSITIVE Sensitive     TRIMETH/SULFA <=10 SENSITIVE Sensitive     CLINDAMYCIN >=8 RESISTANT Resistant     RIFAMPIN <=0.5 SENSITIVE Sensitive     Inducible Clindamycin NEGATIVE Sensitive     LINEZOLID 2 SENSITIVE Sensitive     * ABUNDANT STAPHYLOCOCCUS AUREUS    Labs: CBC: Recent Labs  Lab 10/22/23 2054 10/23/23 0346  WBC 12.3* 11.8*  NEUTROABS 10.7*  --   HGB 13.5 11.5*  HCT 41.9 35.2*  MCV 102.7* 103.8*  PLT 54* 46*   Basic Metabolic Panel: Recent Labs  Lab 10/22/23 2054 10/23/23 0346  NA 136 138  K 4.5 5.0  CL 96* 101  CO2 25 26  GLUCOSE 264* 233*  BUN 23 23  CREATININE 1.48* 1.33*  CALCIUM  8.8* 8.5*   Liver Function Tests: No results for input(s): AST, ALT, ALKPHOS, BILITOT, PROT, ALBUMIN  in the last 168 hours. CBG: Recent Labs  Lab 10/23/23 0748 10/23/23 1209 10/23/23 1645 10/23/23 2040 10/24/23 0740  GLUCAP 252* 157* 138* 124* 200*    Discharge time spent: greater than 30 minutes.  Signed: Adriana DELENA Grams, MD [facility] 10/24/2023

## 2023-10-24 NOTE — Evaluation (Signed)
 Physical Therapy Evaluation Patient Details Name: [name] Cyr Geron Jr. MRN: [id number] DOB: 12-Dec-1945 Today's Date: 10/24/2023  History of Present Illness  Pt is a 78yo male who underwent L transmet amputation and R 2nd toe amp at MPJ level. PMH: DM-I, CVA with L hemiparesis, HTN, HLD, PAD, chronic pain syndrome, MS, COPD  Clinical Impression  Pt admitted with above diagnosis. Pt agreeable to consult. Pt has assist at home with aide and spouse assisting to and from [facility], otherwise pt has lift chair and uses for a majority of mobility, motorized scooter for community mobility. Pt requires supervision A to come to sit EOB, per chart review pt was NWB at last admission and no carry over noted, pt states that he is still under these precautions and does not have his post op shoe. Stand pivot with NWB LLE attempted and pt initially needs max A, pt did place his L heel on the ground with a  significant improvement in his ability to complete the transfer (min A), encouraged to place foot off the ground and returned to sitting. Transfer not progressed but with the donning of post op shoe and assistance in transfers from LLE, pt is at or about baseline function and should be able to continue demonstrating min A for transfers to and from [facility]. Pt currently with functional limitations due to the deficits listed below (see PT Problem List). Pt will benefit from acute skilled PT to increase their independence and safety with mobility to allow discharge.           If plan is discharge home, recommend the following: A little help with walking and/or transfers;A little help with bathing/dressing/bathroom;Assistance with cooking/housework;Assist for transportation   Can travel by private vehicle        Equipment Recommendations None recommended by PT  Recommendations for Other Services       Functional Status Assessment Patient has had a recent decline in their functional status and demonstrates the ability to  make significant improvements in function in a reasonable and predictable amount of time.     Precautions / Restrictions Precautions Precautions: Fall Restrictions Weight Bearing Restrictions Per Provider Order: Yes LLE Weight Bearing Per Provider Order: Non weight bearing Other Position/Activity Restrictions: from previous admission, not found in notes an end date for restriction but pt states that he is still under NWB and does not have post op shoe      Mobility  Bed Mobility Overal bed mobility: Needs Assistance Bed Mobility: Supine to Sit, Sit to Supine     Supine to sit: Supervision Sit to supine: Supervision   General bed mobility comments: inc time, able to come to sit and returns to supine, able to adjust Berber in bed    Transfers Overall transfer level: Needs assistance Equipment used: Rolling walker (2 wheels) Transfers: Sit to/from Stand Sit to Stand: Max assist           General transfer comment: Pt instructed to maintain NWB on LLE during transfer to and from [facility], requires max A to complete with these parameters, there was a moment when pt placed heel on the ground and was able to improve level of assistance needed for 50% of STS transfer, removes LLE from floor and returns to sitting. No further mobility attempted    Ambulation/Gait                  Careers information officer  Tilt Bed    Modified Rankin (Stroke Patients Only)       Balance Overall balance assessment: Mild deficits observed, not formally tested                                           Pertinent Vitals/Pain Pain Assessment Pain Assessment: No/denies pain    Home Living Family/patient expects to be discharged to:: Private residence Living Arrangements: Spouse/significant other Available Help at Discharge: Family;Available 24 hours/day;Neighbor Type of Home: House Home Access: Level entry       Home Layout: One level Home  Equipment: Rollator (4 wheels);Electric scooter;Shower seat;BSC/3in1;Wheelchair - manual Additional Comments: pt reports having HH RN and aide    Prior Function Prior Level of Function : Needs assist             Mobility Comments: uses w/c ADLs Comments: HH aide assists with ADLs     Extremity/Trunk Assessment   Upper Extremity Assessment Upper Extremity Assessment: Overall WFL for tasks assessed    Lower Extremity Assessment Lower Extremity Assessment: Generalized weakness;LLE deficits/detail LLE Deficits / Details: per previous admission and pt report he is still under NWB precuations on LLE unless in his post op shoe and for transfers only       Communication   Communication Communication: Impaired Factors Affecting Communication: Hearing impaired    Cognition Arousal: Alert Behavior During Therapy: WFL for tasks assessed/performed   PT - Cognitive impairments: No apparent impairments                         Following commands: Intact       Cueing Cueing Techniques: Verbal cues, Gestural cues, Tactile cues     General Comments General comments (skin integrity, edema, etc.): able to sit at bedside with good balance with and without HHA, requires support during standing    Exercises     Assessment/Plan    PT Assessment Patient needs continued PT services  PT Problem List Decreased strength;Decreased balance;Decreased mobility;Decreased activity tolerance       PT Treatment Interventions DME instruction;Functional mobility training;Patient/family education;Balance training;Therapeutic activities;Therapeutic exercise    PT Goals (Current goals can be found in the Care Plan section)  Acute Rehab PT Goals Patient Stated Goal: return home PT Goal Formulation: With patient Time For Goal Achievement: 11/07/23 Potential to Achieve Goals: Good    Frequency Min 2X/week     Co-evaluation               AM-PAC PT 6 Clicks Mobility   Outcome Measure Help needed turning from your back to your side while in a flat bed without using bedrails?: None Help needed moving from lying on your back to sitting on the side of a flat bed without using bedrails?: A Little Help needed moving to and from a bed to a chair (including a wheelchair)?: A Little Help needed standing up from a chair using your arms (e.g., wheelchair or bedside chair)?: A Little Help needed to walk in hospital room?: Total Help needed climbing 3-5 steps with a railing? : Total 6 Click Score: 15    End of Session Equipment Utilized During Treatment: Gait belt Activity Tolerance: Patient tolerated treatment well;Other (comment) (unable to WB without post op shoe) Patient left: in bed;with call bell/phone within reach Nurse Communication: Mobility status PT Visit Diagnosis: Muscle weakness (generalized) (M62.81);Other  symptoms and signs involving the nervous system (R29.898);Unsteadiness on feet (R26.81)    Time: 8879-8850 PT Time Calculation (min) (ACUTE ONLY): 29 min   Charges:   PT Evaluation $PT Eval Moderate Complexity: 1 Mod PT Treatments $Therapeutic Activity: 8-22 mins PT General Charges $$ ACUTE PT VISIT: 1 Visit         Stann, PT Acute Rehabilitation Services Office: [phone] 10/24/2023   Stann DELENA Ohara 10/24/2023, 12:04 PM

## 2023-10-24 NOTE — Progress Notes (Signed)
 Discharge meds in a secure bag delivered to pt in room. Probiotic to be purchased OTC or pt to eat greek yogurt- pt verbalized an understanding

## 2023-10-24 NOTE — Progress Notes (Signed)
 Pt ready for discharge. AVS printed. IV removed. Spouse at bedside. Medications, home health, and follow up appointments reviewed. Pt/pt spouse verbalized understanding. Pt dc in wheelchair with staff.

## 2023-10-24 NOTE — TOC Transition Note (Signed)
 Transition of Care [facility]) - Discharge Note   Patient Details  Name: [name] Cristobal Shankle Jr. MRN: [id number] Date of Birth: [birth year]  [facility] CM/SW Contact:  Doneta Glenys DASEN, RN Phone Number: 10/24/2023, 3:32 PM   Clinical Narrative:    Patient being discharged with [facility] for PT/OT/SW/Nurse/PCA.    Final next level of care: Home w Home Health Services Barriers to Discharge: Barriers Resolved   Patient Goals and CMS Choice Patient states their goals for this hospitalization and ongoing recovery are:: Home CMS Medicare.gov Compare Post Acute Care list provided to::  (NA) Choice offered to / list presented to : NA [place] ownership interest in [facility].provided to:: Parent NA    Discharge Placement                       Discharge Plan and Services Additional resources added to the After Visit Summary for   In-house Referral: NA Discharge Planning Services: CM Consult            DME Arranged: N/A DME Agency: NA       HH Arranged: RN, PT, OT, Nurse's Aide, Social Work [employer] Agency: [facility] Date HH Agency Contacted: 10/24/23 Time HH Agency Contacted: 1300 Representative spoke with at [facility] Agency: Arna  Social Drivers of Health (SDOH) Interventions SDOH Screenings   Food Insecurity: No Food Insecurity (10/23/2023)  Housing: Low Risk  (10/23/2023)  Transportation Needs: No Transportation Needs (10/23/2023)  Utilities: Not At Risk (10/23/2023)  Social Connections: Socially Integrated (10/23/2023)  Tobacco Use: Medium Risk (10/22/2023)     Readmission Risk Interventions    10/24/2023    9:25 AM 02/13/2023   10:26 AM  Readmission Risk Prevention Plan  Transportation Screening Complete Complete  PCP or Specialist Appt within 5-7 Days Complete   PCP or Specialist Appt within 3-5 Days  Complete  Home Care Screening Complete   Medication Review (RN CM) Complete   Social Work Consult for Recovery Care Planning/Counseling   Complete  Palliative Care Screening  Not Applicable  Medication Review Oceanographer)  Referral to Pharmacy

## 2023-10-24 NOTE — TOC Initial Note (Signed)
 Transition of Care (TOC) - Initial/Assessment Note    Patient Details  Name: [name] Luppino Winquist Jr. MRN: [id number] Date of Birth: 05-12-1945  [facility]) CM/SW Contact:    Doneta Glenys DASEN, RN Phone Number: 10/24/2023, 9:39 AM  Clinical Narrative:                 Presented for foot pain following recent surgery. Patients wife Vanecek,[name] ([phone] states PTA lives in a house. DME-rollator,BSC and WC; active with [facility] - HH RN and PCA, [employer] for oxygen  nocturnal; [facility] will transport at discharge. Will need a resumption order for [facility] and possibly oxygen . IP CM following for HH and possibly oxygen  needs.  Expected Discharge Plan: Home w Home Health Services Barriers to Discharge: Continued Medical Work up   Patient Goals and CMS Choice Patient states their goals for this hospitalization and ongoing recovery are:: Home CMS Medicare.gov Compare Post Acute Care list provided to::  (NA) Choice offered to / list presented to : NA [place] ownership interest in [facility].provided to:: Parent NA    Expected Discharge Plan and Services In-house Referral: NA Discharge Planning Services: CM Consult   Living arrangements for the past 2 months: Single Family Home                 DME Arranged: N/A DME Agency: NA       HH Arranged: NA HH Agency: NA        Prior Living Arrangements/Services Living arrangements for the past 2 months: Single Family Home Lives with:: Spouse Patient language and need for interpreter reviewed:: Yes Do you feel safe going back to the place where you live?: Yes      Need for Family Participation in Patient Care: Yes (Comment) Care giver support system in place?: Yes (comment) Current home services: DME, Homehealth aide, Home PT, Home RN, Other (comment) (rollator,BSC,WC, oxygen , [facility] & [facility]) Criminal Activity/Legal Involvement Pertinent to Current Situation/Hospitalization: No - Comment as needed  Activities  of Daily Living   ADL Screening (condition at time of admission) Independently performs ADLs?: Yes (appropriate for developmental age) (use walker at home) Is the patient deaf or have difficulty hearing?: No Does the patient have difficulty seeing, even when wearing glasses/contacts?: No Does the patient have difficulty concentrating, remembering, or making decisions?: No  Permission Sought/Granted Permission sought to share information with : Case Manager Permission granted to share information with : Yes, Verbal Permission Granted  Share Information with NAME: Kocak,[name] (Spouse)  [phone]  Permission granted to share info w AGENCY: [facility]        Emotional Assessment Appearance:: Appears stated age Attitude/Demeanor/Rapport: Engaged Affect (typically observed): Appropriate Orientation: : Oriented to [place], Oriented to Place, Oriented to  Time, Oriented to Situation Alcohol  / Substance Use: Not Applicable Psych Involvement: No (comment)  Admission diagnosis:  Constipation due to opioid therapy [K59.03, T40.2X5A] 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] Patient Active Problem List   Diagnosis Date Noted   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   COPD (chronic obstructive pulmonary disease) (HCC) 10/22/2023   BPH (benign prostatic hyperplasia) 10/22/2023   Peripheral artery disease (HCC) 10/22/2023   Acquired deformity of toe, right 09/25/2023   Acute osteomyelitis of left foot (HCC) 09/23/2023   Osteomyelitis of toe of left foot (HCC) 06/21/2023   Osteomyelitis of great toe of right foot (HCC) 03/01/2023   Osteomyelitis (HCC) 02/28/2023   Contusion of second toe of left foot 02/09/2023   Status post amputation of left  great toe (HCC) 02/09/2023   Chronic kidney disease, stage 3a (HCC) 10/10/2022   Diabetic ulcer of toe (HCC) 10/10/2022   Thrombocytopenia (HCC) 10/10/2022   Local infection of skin and subcutaneous tissue 09/21/2022   Chronic pain syndrome 04/01/2017   Coronary  artery disease involving native coronary artery 03/31/2017   Depression with anxiety 03/31/2017   History of CVA (cerebrovascular accident) 03/31/2017   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/05/2015   Other fatigue 10/05/2015   Dyspnea 10/05/2015   Late effects of cerebrovascular accident 04/20/2013   Multiple sclerosis (HCC) 06/04/2012   Abnormality of gait 03/31/2011   Diabetic polyneuropathy (HCC) 03/31/2011   Pure hypercholesterolemia 01/07/2009   Type 1 diabetes mellitus with complications (HCC) 01/06/2009   B12 DEFICIENCY 01/06/2009   Essential hypertension 01/06/2009   PCP:  Shepard Ade, MD Pharmacy:   [facility] APFS No address on file   [facility] [address] [place] [address] Phone: [phone] Fax: [phone]  [facility] [facility] [place], [place] - [address] [place] [address] Phone: [phone] Fax: [phone]  [facility] [address] [place] [address] Phone: [phone] Fax: [phone]     Social Drivers of Health (SDOH) Social History: SDOH Screenings   Food Insecurity: No Food Insecurity (10/23/2023)  Housing: Low Risk  (10/23/2023)  Transportation Needs: No Transportation Needs (10/23/2023)  Utilities: Not At Risk (10/23/2023)  Social Connections: Socially Integrated (10/23/2023)  Tobacco Use: Medium Risk (10/22/2023)   SDOH Interventions:     Readmission Risk Interventions    10/24/2023    9:25 AM 02/13/2023   10:26 AM  Readmission Risk Prevention Plan  Transportation Screening Complete Complete  PCP or Specialist Appt within 5-7 Days Complete   PCP or Specialist Appt within 3-5 Days  Complete  Home Care Screening Complete   Medication Review (RN CM) Complete   Social Work Consult for Recovery  Care Planning/Counseling  Complete  Palliative Care Screening  Not Applicable  Medication Review Oceanographer)  Referral to Pharmacy

## 2023-10-30 ENCOUNTER — Other Ambulatory Visit (HOSPITAL_BASED_OUTPATIENT_CLINIC_OR_DEPARTMENT_OTHER): Payer: Self-pay

## 2023-10-30 DIAGNOSIS — Z79891 Long term (current) use of opiate analgesic: Secondary | ICD-10-CM | POA: Diagnosis not present

## 2023-10-30 DIAGNOSIS — R251 Tremor, unspecified: Secondary | ICD-10-CM | POA: Diagnosis not present

## 2023-10-30 DIAGNOSIS — E1042 Type 1 diabetes mellitus with diabetic polyneuropathy: Secondary | ICD-10-CM | POA: Diagnosis not present

## 2023-10-30 DIAGNOSIS — G894 Chronic pain syndrome: Secondary | ICD-10-CM | POA: Diagnosis not present

## 2023-10-30 MED ORDER — OXYCODONE HCL 15 MG PO TABS
15.0000 mg | ORAL_TABLET | Freq: Four times a day (QID) | ORAL | 0 refills | Status: AC | PRN
  Filled 2023-11-07: qty 120, 30d supply, fill #0

## 2023-10-30 MED ORDER — NUCYNTA ER 200 MG PO TB12
200.0000 mg | ORAL_TABLET | Freq: Two times a day (BID) | ORAL | 0 refills | Status: DC
  Filled 2023-12-06: qty 60, 30d supply, fill #0

## 2023-10-30 MED ORDER — OXYCODONE HCL 15 MG PO TABS
15.0000 mg | ORAL_TABLET | Freq: Four times a day (QID) | ORAL | 0 refills | Status: DC | PRN
  Filled 2023-12-06: qty 120, 30d supply, fill #0

## 2023-10-30 MED ORDER — NUCYNTA ER 200 MG PO TB12
200.0000 mg | ORAL_TABLET | Freq: Two times a day (BID) | ORAL | 0 refills | Status: AC
  Filled 2023-11-07: qty 60, 30d supply, fill #0

## 2023-11-02 DIAGNOSIS — E1022 Type 1 diabetes mellitus with diabetic chronic kidney disease: Secondary | ICD-10-CM | POA: Diagnosis not present

## 2023-11-02 DIAGNOSIS — N1831 Chronic kidney disease, stage 3a: Secondary | ICD-10-CM | POA: Diagnosis not present

## 2023-11-02 DIAGNOSIS — E1059 Type 1 diabetes mellitus with other circulatory complications: Secondary | ICD-10-CM | POA: Diagnosis not present

## 2023-11-02 DIAGNOSIS — L89322 Pressure ulcer of left buttock, stage 2: Secondary | ICD-10-CM | POA: Diagnosis not present

## 2023-11-02 DIAGNOSIS — I129 Hypertensive chronic kidney disease with stage 1 through stage 4 chronic kidney disease, or unspecified chronic kidney disease: Secondary | ICD-10-CM | POA: Diagnosis not present

## 2023-11-02 DIAGNOSIS — Z4781 Encounter for orthopedic aftercare following surgical amputation: Secondary | ICD-10-CM | POA: Diagnosis not present

## 2023-11-03 DIAGNOSIS — E1059 Type 1 diabetes mellitus with other circulatory complications: Secondary | ICD-10-CM | POA: Diagnosis not present

## 2023-11-03 DIAGNOSIS — I129 Hypertensive chronic kidney disease with stage 1 through stage 4 chronic kidney disease, or unspecified chronic kidney disease: Secondary | ICD-10-CM | POA: Diagnosis not present

## 2023-11-03 DIAGNOSIS — L89322 Pressure ulcer of left buttock, stage 2: Secondary | ICD-10-CM | POA: Diagnosis not present

## 2023-11-03 DIAGNOSIS — N1831 Chronic kidney disease, stage 3a: Secondary | ICD-10-CM | POA: Diagnosis not present

## 2023-11-03 DIAGNOSIS — E1022 Type 1 diabetes mellitus with diabetic chronic kidney disease: Secondary | ICD-10-CM | POA: Diagnosis not present

## 2023-11-03 DIAGNOSIS — Z4781 Encounter for orthopedic aftercare following surgical amputation: Secondary | ICD-10-CM | POA: Diagnosis not present

## 2023-11-07 ENCOUNTER — Ambulatory Visit: Admitting: Physical Therapy

## 2023-11-07 ENCOUNTER — Ambulatory Visit (INDEPENDENT_AMBULATORY_CARE_PROVIDER_SITE_OTHER): Admitting: Podiatry

## 2023-11-07 ENCOUNTER — Other Ambulatory Visit (HOSPITAL_BASED_OUTPATIENT_CLINIC_OR_DEPARTMENT_OTHER): Payer: Self-pay

## 2023-11-07 ENCOUNTER — Encounter: Payer: Self-pay | Admitting: Podiatry

## 2023-11-07 ENCOUNTER — Other Ambulatory Visit: Payer: Self-pay

## 2023-11-07 DIAGNOSIS — M86172 Other acute osteomyelitis, left ankle and foot: Secondary | ICD-10-CM

## 2023-11-07 DIAGNOSIS — Z9889 Other specified postprocedural states: Secondary | ICD-10-CM

## 2023-11-07 DIAGNOSIS — M2061 Acquired deformities of toe(s), unspecified, right foot: Secondary | ICD-10-CM

## 2023-11-07 NOTE — Progress Notes (Signed)
 Subjective:  Patient ID: [name]., male    DOB: April 17, 1945,  MRN: [id number]  Chief Complaint  Patient presents with   Routine Post Op    Osteomyelitis L fourth toe, non functional right 2nd toe with flexion deformity S/p R 2nd toe amputation, Left foot transmetatarsal amputation. 0 pain.IDDM A1C 6.4. Wound care done by home health nurse.    DOS: 09/25/23 Procedure: 1. Transmetatarsal amputation of LEFT foot 2. Amputation of 2nd toe at MPJ level, RIGHT foot  78 y.o. male seen for post op check.    Patient is now 6 weeks after surgery.  Reports that the right foot appears to be fully healed there is been no drainage from the second toe amputation site.  Has been getting twice weekly dressing changes on the left foot per prior instruction.  Staples still intact.  Denies pain.  Review of Systems: Negative except as noted in the HPI. Denies N/V/F/Ch.   Objective:   Constitutional Well developed. Well nourished.  Vascular Foot warm and well perfused. Capillary refill normal to all digits.   No calf pain with palpation  Neurologic Normal speech. Oriented to person, place, and time. Epicritic sensation intact  Dermatologic Left foot transmetatarsal amputation site improved from prior see precleansing picture below.  I remove the staples and there was a very small area of superficial ulceration at the lateral aspect of the incision line though the majority is well-healed and coapted underlying some eschar that was removed today.  Right second toe amputation is now fully healed no evidence of open wound dehiscence or residual infection    Orthopedic: S/p L foot TMA, R foot 2nd toe amp at 2nd proximal phalanx base level   Radiographs: XR R foot:  Interval amputation of the second toe at the base of the proximal phalanx. XR L foot: Interval transmetatarsal amputation of all 5 rays.   Pathology:  FINAL MICROSCOPIC DIAGNOSIS:   A. TOE, RIGHT SECOND, AMPUTATION:  - Benign bone  with chronic inflammation  - Margin appears viable   B. FOOT, LEFT, TRANSMETATARSAL AMPUTATION:  - Benign bone with acute inflammation  - Margin appears viable   Micro: ABUNDANT STAPHYLOCOCCUS AUREUS - MSSA  Assessment:   1. Acute osteomyelitis of toe, left (HCC)   2. Acquired deformity of toe, right   3. Post-operative state      Osteomyelitis L fourth toe, non functional right 2nd toe with flexion deformity S/p R 2nd toe amputation, Left foot transmetatarsal amputation  Plan:  Patient was evaluated and treated and all questions answered.  6 week s/p L foot TMA, R 2nd toe amputation  -Progressing as expected post op, right foot amputation site appears to be fully healed at this time -Left foot amputation site much improved from prior nearly fully healed there is small superficial dehiscence laterally that is improving with local wound care and drying out.  Recommend Xeroform dressing to the area twice weekly.  I placed some Steri-Strips on the medial aspect.  Continue with wound care until fully healed -Continue with twice weekly dry gauze dressing changes to left foot with Xeroform contact layer on the lateral aspect where the wound is -XR: expected post op changes -WB Status: Weightbearing as tolerated regular shoe on the right foot, weightbearing in postop shoe on the left foot -Sutures: Removed remaining staples on the left foot TMA -Medications/ABX: No indication for antibiotics at this time -Dressing: No dressing or care required on the right foot.  Applied Xeroform 4 x  4 Kerlix Ace dressing to the left foot after I had debrided off some dried skin and eschar from the amputation line..  - F/u Plan: Follow-up in 4 weeks        Marolyn JULIANNA Honour, DPM [facility] / [facility]

## 2023-11-10 DIAGNOSIS — Z4781 Encounter for orthopedic aftercare following surgical amputation: Secondary | ICD-10-CM | POA: Diagnosis not present

## 2023-11-10 DIAGNOSIS — I129 Hypertensive chronic kidney disease with stage 1 through stage 4 chronic kidney disease, or unspecified chronic kidney disease: Secondary | ICD-10-CM | POA: Diagnosis not present

## 2023-11-10 DIAGNOSIS — N1831 Chronic kidney disease, stage 3a: Secondary | ICD-10-CM | POA: Diagnosis not present

## 2023-11-10 DIAGNOSIS — E1059 Type 1 diabetes mellitus with other circulatory complications: Secondary | ICD-10-CM | POA: Diagnosis not present

## 2023-11-10 DIAGNOSIS — L89322 Pressure ulcer of left buttock, stage 2: Secondary | ICD-10-CM | POA: Diagnosis not present

## 2023-11-10 DIAGNOSIS — E1022 Type 1 diabetes mellitus with diabetic chronic kidney disease: Secondary | ICD-10-CM | POA: Diagnosis not present

## 2023-11-13 DIAGNOSIS — E10610 Type 1 diabetes mellitus with diabetic neuropathic arthropathy: Secondary | ICD-10-CM | POA: Diagnosis not present

## 2023-11-13 DIAGNOSIS — K6289 Other specified diseases of anus and rectum: Secondary | ICD-10-CM | POA: Diagnosis not present

## 2023-11-13 DIAGNOSIS — E1042 Type 1 diabetes mellitus with diabetic polyneuropathy: Secondary | ICD-10-CM | POA: Diagnosis not present

## 2023-11-13 DIAGNOSIS — J449 Chronic obstructive pulmonary disease, unspecified: Secondary | ICD-10-CM | POA: Diagnosis not present

## 2023-11-13 DIAGNOSIS — G894 Chronic pain syndrome: Secondary | ICD-10-CM | POA: Diagnosis not present

## 2023-11-13 DIAGNOSIS — F419 Anxiety disorder, unspecified: Secondary | ICD-10-CM | POA: Diagnosis not present

## 2023-11-13 DIAGNOSIS — D696 Thrombocytopenia, unspecified: Secondary | ICD-10-CM | POA: Diagnosis not present

## 2023-11-13 DIAGNOSIS — M47816 Spondylosis without myelopathy or radiculopathy, lumbar region: Secondary | ICD-10-CM | POA: Diagnosis not present

## 2023-11-13 DIAGNOSIS — E1022 Type 1 diabetes mellitus with diabetic chronic kidney disease: Secondary | ICD-10-CM | POA: Diagnosis not present

## 2023-11-13 DIAGNOSIS — R3911 Hesitancy of micturition: Secondary | ICD-10-CM | POA: Diagnosis not present

## 2023-11-13 DIAGNOSIS — E859 Amyloidosis, unspecified: Secondary | ICD-10-CM | POA: Diagnosis not present

## 2023-11-13 DIAGNOSIS — E538 Deficiency of other specified B group vitamins: Secondary | ICD-10-CM | POA: Diagnosis not present

## 2023-11-13 DIAGNOSIS — N401 Enlarged prostate with lower urinary tract symptoms: Secondary | ICD-10-CM | POA: Diagnosis not present

## 2023-11-13 DIAGNOSIS — F329 Major depressive disorder, single episode, unspecified: Secondary | ICD-10-CM | POA: Diagnosis not present

## 2023-11-13 DIAGNOSIS — E7800 Pure hypercholesterolemia, unspecified: Secondary | ICD-10-CM | POA: Diagnosis not present

## 2023-11-13 DIAGNOSIS — I252 Old myocardial infarction: Secondary | ICD-10-CM | POA: Diagnosis not present

## 2023-11-13 DIAGNOSIS — N1831 Chronic kidney disease, stage 3a: Secondary | ICD-10-CM | POA: Diagnosis not present

## 2023-11-13 DIAGNOSIS — I2510 Atherosclerotic heart disease of native coronary artery without angina pectoris: Secondary | ICD-10-CM | POA: Diagnosis not present

## 2023-11-13 DIAGNOSIS — I872 Venous insufficiency (chronic) (peripheral): Secondary | ICD-10-CM | POA: Diagnosis not present

## 2023-11-13 DIAGNOSIS — E1051 Type 1 diabetes mellitus with diabetic peripheral angiopathy without gangrene: Secondary | ICD-10-CM | POA: Diagnosis not present

## 2023-11-13 DIAGNOSIS — G35D Multiple sclerosis, unspecified: Secondary | ICD-10-CM | POA: Diagnosis not present

## 2023-11-13 DIAGNOSIS — I129 Hypertensive chronic kidney disease with stage 1 through stage 4 chronic kidney disease, or unspecified chronic kidney disease: Secondary | ICD-10-CM | POA: Diagnosis not present

## 2023-11-13 DIAGNOSIS — I69354 Hemiplegia and hemiparesis following cerebral infarction affecting left non-dominant side: Secondary | ICD-10-CM | POA: Diagnosis not present

## 2023-11-13 DIAGNOSIS — H3530 Unspecified macular degeneration: Secondary | ICD-10-CM | POA: Diagnosis not present

## 2023-11-13 DIAGNOSIS — Z4781 Encounter for orthopedic aftercare following surgical amputation: Secondary | ICD-10-CM | POA: Diagnosis not present

## 2023-11-17 DIAGNOSIS — N1831 Chronic kidney disease, stage 3a: Secondary | ICD-10-CM | POA: Diagnosis not present

## 2023-11-17 DIAGNOSIS — Z4781 Encounter for orthopedic aftercare following surgical amputation: Secondary | ICD-10-CM | POA: Diagnosis not present

## 2023-11-17 DIAGNOSIS — E1051 Type 1 diabetes mellitus with diabetic peripheral angiopathy without gangrene: Secondary | ICD-10-CM | POA: Diagnosis not present

## 2023-11-17 DIAGNOSIS — E1022 Type 1 diabetes mellitus with diabetic chronic kidney disease: Secondary | ICD-10-CM | POA: Diagnosis not present

## 2023-11-17 DIAGNOSIS — I129 Hypertensive chronic kidney disease with stage 1 through stage 4 chronic kidney disease, or unspecified chronic kidney disease: Secondary | ICD-10-CM | POA: Diagnosis not present

## 2023-11-17 DIAGNOSIS — G894 Chronic pain syndrome: Secondary | ICD-10-CM | POA: Diagnosis not present

## 2023-11-22 DIAGNOSIS — G894 Chronic pain syndrome: Secondary | ICD-10-CM | POA: Diagnosis not present

## 2023-11-22 DIAGNOSIS — G35D Multiple sclerosis, unspecified: Secondary | ICD-10-CM | POA: Diagnosis not present

## 2023-11-22 DIAGNOSIS — Z4781 Encounter for orthopedic aftercare following surgical amputation: Secondary | ICD-10-CM | POA: Diagnosis not present

## 2023-11-22 DIAGNOSIS — N1832 Chronic kidney disease, stage 3b: Secondary | ICD-10-CM | POA: Diagnosis not present

## 2023-11-22 DIAGNOSIS — M47816 Spondylosis without myelopathy or radiculopathy, lumbar region: Secondary | ICD-10-CM | POA: Diagnosis not present

## 2023-11-22 DIAGNOSIS — E859 Amyloidosis, unspecified: Secondary | ICD-10-CM | POA: Diagnosis not present

## 2023-11-22 DIAGNOSIS — E1051 Type 1 diabetes mellitus with diabetic peripheral angiopathy without gangrene: Secondary | ICD-10-CM | POA: Diagnosis not present

## 2023-11-22 DIAGNOSIS — E1022 Type 1 diabetes mellitus with diabetic chronic kidney disease: Secondary | ICD-10-CM | POA: Diagnosis not present

## 2023-11-22 DIAGNOSIS — K6289 Other specified diseases of anus and rectum: Secondary | ICD-10-CM | POA: Diagnosis not present

## 2023-11-22 DIAGNOSIS — I129 Hypertensive chronic kidney disease with stage 1 through stage 4 chronic kidney disease, or unspecified chronic kidney disease: Secondary | ICD-10-CM | POA: Diagnosis not present

## 2023-11-22 DIAGNOSIS — J449 Chronic obstructive pulmonary disease, unspecified: Secondary | ICD-10-CM | POA: Diagnosis not present

## 2023-11-22 DIAGNOSIS — I69354 Hemiplegia and hemiparesis following cerebral infarction affecting left non-dominant side: Secondary | ICD-10-CM | POA: Diagnosis not present

## 2023-11-27 DIAGNOSIS — Z1331 Encounter for screening for depression: Secondary | ICD-10-CM | POA: Diagnosis not present

## 2023-11-27 DIAGNOSIS — Z1389 Encounter for screening for other disorder: Secondary | ICD-10-CM | POA: Diagnosis not present

## 2023-11-27 DIAGNOSIS — E1029 Type 1 diabetes mellitus with other diabetic kidney complication: Secondary | ICD-10-CM | POA: Diagnosis not present

## 2023-11-27 DIAGNOSIS — Z125 Encounter for screening for malignant neoplasm of prostate: Secondary | ICD-10-CM | POA: Diagnosis not present

## 2023-11-27 DIAGNOSIS — E785 Hyperlipidemia, unspecified: Secondary | ICD-10-CM | POA: Diagnosis not present

## 2023-11-27 DIAGNOSIS — E1059 Type 1 diabetes mellitus with other circulatory complications: Secondary | ICD-10-CM | POA: Diagnosis not present

## 2023-11-27 DIAGNOSIS — M86179 Other acute osteomyelitis, unspecified ankle and foot: Secondary | ICD-10-CM | POA: Diagnosis not present

## 2023-11-27 DIAGNOSIS — Z0189 Encounter for other specified special examinations: Secondary | ICD-10-CM | POA: Diagnosis not present

## 2023-11-27 DIAGNOSIS — Z1339 Encounter for screening examination for other mental health and behavioral disorders: Secondary | ICD-10-CM | POA: Diagnosis not present

## 2023-11-27 DIAGNOSIS — Z8673 Personal history of transient ischemic attack (TIA), and cerebral infarction without residual deficits: Secondary | ICD-10-CM | POA: Diagnosis not present

## 2023-11-27 DIAGNOSIS — E538 Deficiency of other specified B group vitamins: Secondary | ICD-10-CM | POA: Diagnosis not present

## 2023-11-27 DIAGNOSIS — Z23 Encounter for immunization: Secondary | ICD-10-CM | POA: Diagnosis not present

## 2023-11-27 DIAGNOSIS — Z0000 Encounter for general adult medical examination without abnormal findings: Secondary | ICD-10-CM | POA: Diagnosis not present

## 2023-11-27 DIAGNOSIS — I10 Essential (primary) hypertension: Secondary | ICD-10-CM | POA: Diagnosis not present

## 2023-11-27 DIAGNOSIS — J449 Chronic obstructive pulmonary disease, unspecified: Secondary | ICD-10-CM | POA: Diagnosis not present

## 2023-11-27 DIAGNOSIS — I129 Hypertensive chronic kidney disease with stage 1 through stage 4 chronic kidney disease, or unspecified chronic kidney disease: Secondary | ICD-10-CM | POA: Diagnosis not present

## 2023-11-27 DIAGNOSIS — G35D Multiple sclerosis, unspecified: Secondary | ICD-10-CM | POA: Diagnosis not present

## 2023-11-27 DIAGNOSIS — N1831 Chronic kidney disease, stage 3a: Secondary | ICD-10-CM | POA: Diagnosis not present

## 2023-11-27 DIAGNOSIS — G894 Chronic pain syndrome: Secondary | ICD-10-CM | POA: Diagnosis not present

## 2023-11-30 DIAGNOSIS — E1051 Type 1 diabetes mellitus with diabetic peripheral angiopathy without gangrene: Secondary | ICD-10-CM | POA: Diagnosis not present

## 2023-11-30 DIAGNOSIS — G894 Chronic pain syndrome: Secondary | ICD-10-CM | POA: Diagnosis not present

## 2023-11-30 DIAGNOSIS — I129 Hypertensive chronic kidney disease with stage 1 through stage 4 chronic kidney disease, or unspecified chronic kidney disease: Secondary | ICD-10-CM | POA: Diagnosis not present

## 2023-11-30 DIAGNOSIS — E1022 Type 1 diabetes mellitus with diabetic chronic kidney disease: Secondary | ICD-10-CM | POA: Diagnosis not present

## 2023-11-30 DIAGNOSIS — Z4781 Encounter for orthopedic aftercare following surgical amputation: Secondary | ICD-10-CM | POA: Diagnosis not present

## 2023-11-30 DIAGNOSIS — N1831 Chronic kidney disease, stage 3a: Secondary | ICD-10-CM | POA: Diagnosis not present

## 2023-12-04 DIAGNOSIS — H353133 Nonexudative age-related macular degeneration, bilateral, advanced atrophic without subfoveal involvement: Secondary | ICD-10-CM | POA: Diagnosis not present

## 2023-12-04 DIAGNOSIS — H40013 Open angle with borderline findings, low risk, bilateral: Secondary | ICD-10-CM | POA: Diagnosis not present

## 2023-12-05 ENCOUNTER — Other Ambulatory Visit (HOSPITAL_BASED_OUTPATIENT_CLINIC_OR_DEPARTMENT_OTHER): Payer: Self-pay

## 2023-12-05 ENCOUNTER — Ambulatory Visit (INDEPENDENT_AMBULATORY_CARE_PROVIDER_SITE_OTHER): Admitting: Podiatry

## 2023-12-05 DIAGNOSIS — L89622 Pressure ulcer of left heel, stage 2: Secondary | ICD-10-CM

## 2023-12-05 DIAGNOSIS — Z9889 Other specified postprocedural states: Secondary | ICD-10-CM

## 2023-12-05 DIAGNOSIS — M2061 Acquired deformities of toe(s), unspecified, right foot: Secondary | ICD-10-CM

## 2023-12-05 DIAGNOSIS — M86172 Other acute osteomyelitis, left ankle and foot: Secondary | ICD-10-CM

## 2023-12-05 NOTE — Progress Notes (Signed)
 Subjective:  Patient ID: [name]., male    DOB: 20-Mar-1945,  MRN: [id number]  Chief Complaint  Patient presents with   Wound Check     Left foot transmetatarsal amputation. Right foot dorsal wound. Applying triple antibiotic. 0 pain. A1C 6.4.     DOS: 09/25/23 Procedure: 1. Transmetatarsal amputation of LEFT foot 2. Amputation of 2nd toe at MPJ level, RIGHT foot  78 y.o. male seen for post op check.    Patient is now 10 weeks after surgery.  Has a new wound on the left heel.  Otherwise the right foot had some scabs on the top of the foot but seems to have improved.  No issues with the second toe amputation site.  The left foot transmetatarsal amputation site is also healing  Review of Systems: Negative except as noted in the HPI. Denies N/V/F/Ch.   Objective:   Constitutional Well developed. Well nourished.  Vascular Foot warm and well perfused. Capillary refill normal to all digits.   No calf pain with palpation  Neurologic Normal speech. Oriented to person, place, and time. Epicritic sensation intact  Dermatologic Left foot transmetatarsal amputation site improved from prior nearly fully healed there is a small area of superficial dehiscence at the lateral margin.  Debrided some necrotic tissue present on the area.  Right second toe amputation is fully healed  On the left heel there is a new decubitus ulceration with necrotic eschar and mild erythema surrounding.  No active drainage or malodor.  Eschar is firm and stable.    Orthopedic: S/p L foot TMA, R foot 2nd toe amp at 2nd proximal phalanx base level   Radiographs: XR R foot:  Interval amputation of the second toe at the base of the proximal phalanx. XR L foot: Interval transmetatarsal amputation of all 5 rays.   Pathology:  FINAL MICROSCOPIC DIAGNOSIS:   A. TOE, RIGHT SECOND, AMPUTATION:  - Benign bone with chronic inflammation  - Margin appears viable   B. FOOT, LEFT, TRANSMETATARSAL AMPUTATION:  -  Benign bone with acute inflammation  - Margin appears viable   Micro: ABUNDANT STAPHYLOCOCCUS AUREUS - MSSA  Assessment:   1. Acute osteomyelitis of left foot (HCC)   2. Pressure injury of left heel, stage 2 (HCC)   3. Acquired deformity of toe, right   4. Post-operative state       Osteomyelitis L fourth toe, non functional right 2nd toe with flexion deformity S/p R 2nd toe amputation, Left foot transmetatarsal amputation  Plan:  Patient was evaluated and treated and all questions answered.  10 week s/p L foot TMA, R 2nd toe amputation  -Progressing as expected post op, second toe amputation site on the right foot is fully healed there are small eschars on the dorsal foot but no active wounds or areas of infection -no wound care needed on the right foot monitor  -Left foot amputation site much improved from prior nearly fully healed there is small superficial ulceration at the lateral margin of the amputation site that is improving with local wound care continue Betadine dressing adhesive bandage.   - In regards to the left heel decubitus ulceration discussed this is related to chronic pressure on the area.  Needs offloading at all times whether in wheelchair or in bed.  Recommend offloading the heel with pillows or using Prevalon style boot.  Recommend dry gauze dressing over the left heel or with a bordered foam bandage.  -Continue with Betadine and adhesive bandage or gauze  dressing to the left foot TMA site until fully healed -XR: expected post op changes -WB Status: Weightbearing as tolerated regular shoe on the right foot, weightbearing in postop shoe on the left foot -Recommend strict offloading of the left heel to prevent worsening of decubitus ulceration -Sutures: Previously removed -Medications/ABX: No indication for antibiotics at this time  - F/u Plan: Follow-up in 4 weeks        Marolyn JULIANNA Honour, DPM [facility] / [facility]

## 2023-12-06 ENCOUNTER — Other Ambulatory Visit (HOSPITAL_BASED_OUTPATIENT_CLINIC_OR_DEPARTMENT_OTHER): Payer: Self-pay

## 2023-12-06 ENCOUNTER — Other Ambulatory Visit: Payer: Self-pay

## 2023-12-08 DIAGNOSIS — N1831 Chronic kidney disease, stage 3a: Secondary | ICD-10-CM | POA: Diagnosis not present

## 2023-12-08 DIAGNOSIS — I129 Hypertensive chronic kidney disease with stage 1 through stage 4 chronic kidney disease, or unspecified chronic kidney disease: Secondary | ICD-10-CM | POA: Diagnosis not present

## 2023-12-08 DIAGNOSIS — E1051 Type 1 diabetes mellitus with diabetic peripheral angiopathy without gangrene: Secondary | ICD-10-CM | POA: Diagnosis not present

## 2023-12-08 DIAGNOSIS — Z4781 Encounter for orthopedic aftercare following surgical amputation: Secondary | ICD-10-CM | POA: Diagnosis not present

## 2023-12-08 DIAGNOSIS — G894 Chronic pain syndrome: Secondary | ICD-10-CM | POA: Diagnosis not present

## 2023-12-08 DIAGNOSIS — E1022 Type 1 diabetes mellitus with diabetic chronic kidney disease: Secondary | ICD-10-CM | POA: Diagnosis not present

## 2023-12-12 DIAGNOSIS — E1022 Type 1 diabetes mellitus with diabetic chronic kidney disease: Secondary | ICD-10-CM | POA: Diagnosis not present

## 2023-12-12 DIAGNOSIS — Z4781 Encounter for orthopedic aftercare following surgical amputation: Secondary | ICD-10-CM | POA: Diagnosis not present

## 2023-12-12 DIAGNOSIS — G894 Chronic pain syndrome: Secondary | ICD-10-CM | POA: Diagnosis not present

## 2023-12-12 DIAGNOSIS — E1051 Type 1 diabetes mellitus with diabetic peripheral angiopathy without gangrene: Secondary | ICD-10-CM | POA: Diagnosis not present

## 2023-12-12 DIAGNOSIS — I129 Hypertensive chronic kidney disease with stage 1 through stage 4 chronic kidney disease, or unspecified chronic kidney disease: Secondary | ICD-10-CM | POA: Diagnosis not present

## 2023-12-12 DIAGNOSIS — N1831 Chronic kidney disease, stage 3a: Secondary | ICD-10-CM | POA: Diagnosis not present

## 2023-12-13 ENCOUNTER — Other Ambulatory Visit (HOSPITAL_BASED_OUTPATIENT_CLINIC_OR_DEPARTMENT_OTHER): Payer: Self-pay

## 2023-12-13 ENCOUNTER — Encounter (HOSPITAL_BASED_OUTPATIENT_CLINIC_OR_DEPARTMENT_OTHER): Attending: General Surgery | Admitting: General Surgery

## 2023-12-13 DIAGNOSIS — L89626 Pressure-induced deep tissue damage of left heel: Secondary | ICD-10-CM | POA: Diagnosis not present

## 2023-12-13 DIAGNOSIS — L97526 Non-pressure chronic ulcer of other part of left foot with bone involvement without evidence of necrosis: Secondary | ICD-10-CM | POA: Diagnosis not present

## 2023-12-13 DIAGNOSIS — E1151 Type 2 diabetes mellitus with diabetic peripheral angiopathy without gangrene: Secondary | ICD-10-CM | POA: Diagnosis not present

## 2023-12-13 DIAGNOSIS — T8781 Dehiscence of amputation stump: Secondary | ICD-10-CM | POA: Diagnosis not present

## 2023-12-13 DIAGNOSIS — E11621 Type 2 diabetes mellitus with foot ulcer: Secondary | ICD-10-CM | POA: Diagnosis present

## 2023-12-13 DIAGNOSIS — L89620 Pressure ulcer of left heel, unstageable: Secondary | ICD-10-CM | POA: Insufficient documentation

## 2023-12-13 MED ORDER — SANTYL 250 UNIT/GM EX OINT
1.0000 | TOPICAL_OINTMENT | Freq: Every day | CUTANEOUS | 1 refills | Status: AC
  Filled 2023-12-13: qty 30, 30d supply, fill #0
  Filled 2024-01-22: qty 30, 30d supply, fill #1

## 2023-12-14 ENCOUNTER — Other Ambulatory Visit (HOSPITAL_BASED_OUTPATIENT_CLINIC_OR_DEPARTMENT_OTHER): Payer: Self-pay

## 2023-12-20 ENCOUNTER — Encounter (HOSPITAL_BASED_OUTPATIENT_CLINIC_OR_DEPARTMENT_OTHER): Admitting: General Surgery

## 2023-12-20 DIAGNOSIS — E1151 Type 2 diabetes mellitus with diabetic peripheral angiopathy without gangrene: Secondary | ICD-10-CM | POA: Diagnosis not present

## 2023-12-20 DIAGNOSIS — Z89432 Acquired absence of left foot: Secondary | ICD-10-CM | POA: Diagnosis not present

## 2023-12-20 DIAGNOSIS — L89620 Pressure ulcer of left heel, unstageable: Secondary | ICD-10-CM | POA: Diagnosis not present

## 2023-12-20 DIAGNOSIS — E11621 Type 2 diabetes mellitus with foot ulcer: Secondary | ICD-10-CM | POA: Diagnosis not present

## 2023-12-20 DIAGNOSIS — T8781 Dehiscence of amputation stump: Secondary | ICD-10-CM | POA: Diagnosis not present

## 2023-12-20 DIAGNOSIS — L89626 Pressure-induced deep tissue damage of left heel: Secondary | ICD-10-CM | POA: Diagnosis not present

## 2023-12-20 DIAGNOSIS — L97526 Non-pressure chronic ulcer of other part of left foot with bone involvement without evidence of necrosis: Secondary | ICD-10-CM | POA: Diagnosis not present

## 2023-12-27 ENCOUNTER — Encounter (HOSPITAL_BASED_OUTPATIENT_CLINIC_OR_DEPARTMENT_OTHER): Admitting: General Surgery

## 2024-01-02 ENCOUNTER — Ambulatory Visit: Admitting: Podiatry

## 2024-01-03 ENCOUNTER — Other Ambulatory Visit (HOSPITAL_BASED_OUTPATIENT_CLINIC_OR_DEPARTMENT_OTHER): Payer: Self-pay

## 2024-01-03 ENCOUNTER — Encounter (HOSPITAL_BASED_OUTPATIENT_CLINIC_OR_DEPARTMENT_OTHER): Admitting: General Surgery

## 2024-01-03 ENCOUNTER — Other Ambulatory Visit: Payer: Self-pay

## 2024-01-03 DIAGNOSIS — L97526 Non-pressure chronic ulcer of other part of left foot with bone involvement without evidence of necrosis: Secondary | ICD-10-CM | POA: Diagnosis not present

## 2024-01-03 DIAGNOSIS — R251 Tremor, unspecified: Secondary | ICD-10-CM | POA: Diagnosis not present

## 2024-01-03 DIAGNOSIS — G894 Chronic pain syndrome: Secondary | ICD-10-CM | POA: Diagnosis not present

## 2024-01-03 DIAGNOSIS — L89620 Pressure ulcer of left heel, unstageable: Secondary | ICD-10-CM | POA: Diagnosis not present

## 2024-01-03 DIAGNOSIS — Z79891 Long term (current) use of opiate analgesic: Secondary | ICD-10-CM | POA: Diagnosis not present

## 2024-01-03 DIAGNOSIS — T8781 Dehiscence of amputation stump: Secondary | ICD-10-CM | POA: Diagnosis not present

## 2024-01-03 DIAGNOSIS — E11621 Type 2 diabetes mellitus with foot ulcer: Secondary | ICD-10-CM | POA: Diagnosis not present

## 2024-01-03 DIAGNOSIS — E1151 Type 2 diabetes mellitus with diabetic peripheral angiopathy without gangrene: Secondary | ICD-10-CM | POA: Diagnosis not present

## 2024-01-03 DIAGNOSIS — E1042 Type 1 diabetes mellitus with diabetic polyneuropathy: Secondary | ICD-10-CM | POA: Diagnosis not present

## 2024-01-03 MED ORDER — NUCYNTA ER 200 MG PO TB12
1.0000 | ORAL_TABLET | Freq: Two times a day (BID) | ORAL | 0 refills | Status: DC
  Filled 2024-01-03: qty 60, 30d supply, fill #0

## 2024-01-03 MED ORDER — OXYCODONE HCL 15 MG PO TABS
15.0000 mg | ORAL_TABLET | Freq: Four times a day (QID) | ORAL | 0 refills | Status: DC | PRN
  Filled 2024-01-03: qty 120, 30d supply, fill #0

## 2024-01-16 ENCOUNTER — Encounter (HOSPITAL_BASED_OUTPATIENT_CLINIC_OR_DEPARTMENT_OTHER): Admitting: Internal Medicine

## 2024-01-16 DIAGNOSIS — E11621 Type 2 diabetes mellitus with foot ulcer: Secondary | ICD-10-CM | POA: Insufficient documentation

## 2024-01-16 DIAGNOSIS — L89620 Pressure ulcer of left heel, unstageable: Secondary | ICD-10-CM | POA: Diagnosis not present

## 2024-01-16 DIAGNOSIS — I739 Peripheral vascular disease, unspecified: Secondary | ICD-10-CM | POA: Diagnosis not present

## 2024-01-16 DIAGNOSIS — L97526 Non-pressure chronic ulcer of other part of left foot with bone involvement without evidence of necrosis: Secondary | ICD-10-CM | POA: Insufficient documentation

## 2024-01-23 ENCOUNTER — Other Ambulatory Visit (HOSPITAL_BASED_OUTPATIENT_CLINIC_OR_DEPARTMENT_OTHER): Payer: Self-pay

## 2024-01-24 ENCOUNTER — Encounter (HOSPITAL_BASED_OUTPATIENT_CLINIC_OR_DEPARTMENT_OTHER): Admitting: General Surgery

## 2024-01-24 DIAGNOSIS — E11621 Type 2 diabetes mellitus with foot ulcer: Secondary | ICD-10-CM | POA: Diagnosis not present

## 2024-01-24 DIAGNOSIS — L89620 Pressure ulcer of left heel, unstageable: Secondary | ICD-10-CM | POA: Diagnosis not present

## 2024-01-24 DIAGNOSIS — L97522 Non-pressure chronic ulcer of other part of left foot with fat layer exposed: Secondary | ICD-10-CM | POA: Diagnosis not present

## 2024-01-24 DIAGNOSIS — T8781 Dehiscence of amputation stump: Secondary | ICD-10-CM | POA: Diagnosis not present

## 2024-01-29 ENCOUNTER — Other Ambulatory Visit (HOSPITAL_BASED_OUTPATIENT_CLINIC_OR_DEPARTMENT_OTHER): Payer: Self-pay

## 2024-01-29 MED ORDER — NUCYNTA ER 200 MG PO TB12
200.0000 mg | ORAL_TABLET | Freq: Two times a day (BID) | ORAL | 0 refills | Status: AC
  Filled 2024-01-29 – 2024-01-31 (×2): qty 60, 30d supply, fill #0

## 2024-01-29 MED ORDER — OXYCODONE HCL 15 MG PO TABS
15.0000 mg | ORAL_TABLET | Freq: Four times a day (QID) | ORAL | 0 refills | Status: AC | PRN
  Filled 2024-01-29 – 2024-01-31 (×2): qty 120, 30d supply, fill #0

## 2024-01-30 ENCOUNTER — Encounter (HOSPITAL_BASED_OUTPATIENT_CLINIC_OR_DEPARTMENT_OTHER): Admitting: General Surgery

## 2024-01-30 DIAGNOSIS — E11621 Type 2 diabetes mellitus with foot ulcer: Secondary | ICD-10-CM | POA: Diagnosis not present

## 2024-01-31 ENCOUNTER — Other Ambulatory Visit (HOSPITAL_BASED_OUTPATIENT_CLINIC_OR_DEPARTMENT_OTHER): Payer: Self-pay

## 2024-01-31 ENCOUNTER — Other Ambulatory Visit: Payer: Self-pay

## 2024-02-07 ENCOUNTER — Encounter (HOSPITAL_BASED_OUTPATIENT_CLINIC_OR_DEPARTMENT_OTHER): Admitting: General Surgery

## 2024-02-14 ENCOUNTER — Encounter (HOSPITAL_BASED_OUTPATIENT_CLINIC_OR_DEPARTMENT_OTHER): Admitting: General Surgery

## 2024-02-21 ENCOUNTER — Encounter (HOSPITAL_BASED_OUTPATIENT_CLINIC_OR_DEPARTMENT_OTHER): Admitting: General Surgery

## 2024-02-28 ENCOUNTER — Encounter (HOSPITAL_BASED_OUTPATIENT_CLINIC_OR_DEPARTMENT_OTHER): Admitting: General Surgery

## 2024-03-06 ENCOUNTER — Encounter (HOSPITAL_BASED_OUTPATIENT_CLINIC_OR_DEPARTMENT_OTHER): Admitting: General Surgery

## 2024-03-10 DEATH — deceased
